# Patient Record
Sex: Male | Born: 1935 | Race: White | Hispanic: No | Marital: Married | Smoking: Former smoker
Health system: Southern US, Community
[De-identification: ages and names within clinical notes are randomized; demographics above are authoritative.]

## PROBLEM LIST (undated history)

## (undated) DIAGNOSIS — M549 Dorsalgia, unspecified: Secondary | ICD-10-CM

## (undated) DIAGNOSIS — Z87442 Personal history of urinary calculi: Secondary | ICD-10-CM

## (undated) DIAGNOSIS — I10 Essential (primary) hypertension: Secondary | ICD-10-CM

## (undated) DIAGNOSIS — K759 Inflammatory liver disease, unspecified: Secondary | ICD-10-CM

## (undated) DIAGNOSIS — G8929 Other chronic pain: Secondary | ICD-10-CM

## (undated) DIAGNOSIS — D649 Anemia, unspecified: Secondary | ICD-10-CM

## (undated) HISTORY — PX: BACK SURGERY: SHX140

## (undated) HISTORY — PX: ESOPHAGOGASTRODUODENOSCOPY: SHX1529

## (undated) HISTORY — PX: COLONOSCOPY: SHX174

## (undated) HISTORY — PX: CYSTOSCOPY: SUR368

## (undated) HISTORY — PX: LITHOTRIPSY: SUR834

---

## 1999-01-24 ENCOUNTER — Ambulatory Visit (HOSPITAL_COMMUNITY): Admission: RE | Admit: 1999-01-24 | Discharge: 1999-01-24 | Payer: Self-pay | Admitting: Gastroenterology

## 2004-06-08 ENCOUNTER — Inpatient Hospital Stay (HOSPITAL_COMMUNITY): Admission: EM | Admit: 2004-06-08 | Discharge: 2004-06-10 | Payer: Self-pay | Admitting: Emergency Medicine

## 2004-08-17 ENCOUNTER — Encounter: Admission: RE | Admit: 2004-08-17 | Discharge: 2004-08-17 | Payer: Self-pay | Admitting: Internal Medicine

## 2004-08-21 ENCOUNTER — Encounter: Admission: RE | Admit: 2004-08-21 | Discharge: 2004-08-21 | Payer: Self-pay | Admitting: Internal Medicine

## 2005-12-31 ENCOUNTER — Encounter: Admission: RE | Admit: 2005-12-31 | Discharge: 2005-12-31 | Payer: Self-pay | Admitting: Surgery

## 2006-01-02 ENCOUNTER — Ambulatory Visit (HOSPITAL_BASED_OUTPATIENT_CLINIC_OR_DEPARTMENT_OTHER): Admission: RE | Admit: 2006-01-02 | Discharge: 2006-01-02 | Payer: Self-pay | Admitting: Surgery

## 2010-03-05 ENCOUNTER — Encounter: Payer: Self-pay | Admitting: Internal Medicine

## 2011-01-01 ENCOUNTER — Other Ambulatory Visit: Payer: Self-pay | Admitting: Orthopaedic Surgery

## 2011-01-01 DIAGNOSIS — M545 Low back pain, unspecified: Secondary | ICD-10-CM

## 2011-01-05 ENCOUNTER — Ambulatory Visit
Admission: RE | Admit: 2011-01-05 | Discharge: 2011-01-05 | Disposition: A | Discharge status: Home / Self Care | Payer: Medicare Other | Source: Ambulatory Visit | Attending: Orthopaedic Surgery | Admitting: Orthopaedic Surgery

## 2011-01-05 DIAGNOSIS — M545 Low back pain: Secondary | ICD-10-CM

## 2011-01-05 MED ORDER — GADOBENATE DIMEGLUMINE 529 MG/ML IV SOLN
16.0000 mL | Freq: Once | INTRAVENOUS | Status: AC | PRN
  Administered 2011-01-05: 16 mL via INTRAVENOUS

## 2011-01-24 ENCOUNTER — Encounter (HOSPITAL_COMMUNITY): Payer: Self-pay

## 2011-01-25 ENCOUNTER — Other Ambulatory Visit (HOSPITAL_COMMUNITY): Payer: Self-pay | Admitting: Orthopedic Surgery

## 2011-01-25 ENCOUNTER — Other Ambulatory Visit (HOSPITAL_COMMUNITY): Payer: Self-pay | Admitting: Orthopaedic Surgery

## 2011-01-26 ENCOUNTER — Encounter (HOSPITAL_COMMUNITY)
Admission: RE | Admit: 2011-01-26 | Discharge: 2011-01-26 | Disposition: A | Discharge status: Home / Self Care | Payer: Medicare Other | Source: Ambulatory Visit | Attending: Orthopaedic Surgery | Admitting: Orthopaedic Surgery

## 2011-01-26 ENCOUNTER — Other Ambulatory Visit: Payer: Self-pay

## 2011-01-26 ENCOUNTER — Encounter (HOSPITAL_COMMUNITY): Payer: Self-pay

## 2011-01-26 DIAGNOSIS — M5126 Other intervertebral disc displacement, lumbar region: Secondary | ICD-10-CM | POA: Diagnosis present

## 2011-01-26 HISTORY — DX: Anemia, unspecified: D64.9

## 2011-01-26 HISTORY — DX: Essential (primary) hypertension: I10

## 2011-01-26 LAB — CBC
HCT: 45.3 % (ref 39.0–52.0)
Hemoglobin: 16.1 g/dL (ref 13.0–17.0)
MCH: 34 pg (ref 26.0–34.0)
MCHC: 35.5 g/dL (ref 30.0–36.0)
MCV: 95.8 fL (ref 78.0–100.0)
Platelets: 225 K/uL (ref 150–400)
RBC: 4.73 MIL/uL (ref 4.22–5.81)
RDW: 12.2 % (ref 11.5–15.5)
WBC: 11.6 K/uL — ABNORMAL HIGH (ref 4.0–10.5)

## 2011-01-26 LAB — COMPREHENSIVE METABOLIC PANEL
ALT: 15 U/L (ref 0–53)
AST: 17 U/L (ref 0–37)
Albumin: 3.2 g/dL — ABNORMAL LOW (ref 3.5–5.2)
Alkaline Phosphatase: 55 U/L (ref 39–117)
Calcium: 11.7 mg/dL — ABNORMAL HIGH (ref 8.4–10.5)
Potassium: 3.3 mEq/L — ABNORMAL LOW (ref 3.5–5.1)
Sodium: 137 mEq/L (ref 135–145)
Total Protein: 6.6 g/dL (ref 6.0–8.3)

## 2011-01-26 NOTE — Consult Note (Signed)
Anesthesia:  Patient is a 77 year old male scheduled for a right L4-5 microdiskectomy on 01/29/11.  His history included HTN and anemia.  He is a former smoker.  He just had his PAT appointment this afternoon.   I was asked to evaluate his preoperative EKG showing ST, LAD, possible prior inferior and anterolateral infarct.  He had a previous EKG on 12/31/05 done before his right IHR.  Overall, I think his EKG is stable since 2007.  Clinical correlation on the day of surgery, but if asymptomatic then I anticipate that he can proceed.  CXR and labs noted.  Cr 1.5 and non-fasting glucose was 157.  No recent labs to compare.  Dr. Yates can follow Cr post-operatively.  I'll order a CBG to be done on arrival. 

## 2011-01-26 NOTE — H&P (Signed)
Jonathan Berger is an 77 y.o. male.   Chief Complaint:right leg pain   HPI: Pt with approximately 2 months of back pain and right leg pain.  Difficulty ambulating and increased pain in right buttock with sitting.  Conservative treatment including ESI, steroid dose pak and activity modification have failed to provide relief.  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  Pt has history of HNP right L4-5 treated with microdiscectomy in 2006 by Dr. Ophelia Charter.  Previous to that another lumbar surgery 30 years ago.  As pt has failed conservative treatment and continues to have severe pain in the right leg it is felt he will benefit from surgical intervention.  Pt wishes to proceed with microdiscectomy at right L4-5 by Dr Ophelia Charter.  Risks and benefits of the surgery discussed with pt by Dr. Ophelia Charter.  Pain 10 out of 10  , Can't sleep, percocet around the clock. Leg giving way even with use of a cane.    No past medical history on file.PMH: HTN .PMH  No past surgical history on file.PSH: s/p microdiscectomy right L4-5 in 2006  .PSH  No family history on file. Marland KitchenFAMHX Social History:  does not have a smoking history on file. He does not have any smokeless tobacco history on file. His alcohol and drug histories not on file. Marland KitchenSOCHX  Allergies: No Known Allergies .ALG  No current facility-administered medications on file as of .   No current outpatient prescriptions on file as of .  Marland KitchenHMEDS  No results found for this or any previous visit (from the past 48 hour(s)). No results found.  Review of Systems  Constitutional: Negative.   HENT: Negative.   Eyes: Negative.   Respiratory: Negative.   Cardiovascular: Negative.   Gastrointestinal: Negative.   Genitourinary: Negative.   Musculoskeletal: Positive for back pain.  Skin: Negative.   Neurological: Negative.   Endo/Heme/Allergies: Negative.     Psychiatric/Behavioral: Negative.     There were no vitals taken for this visit..VS Physical Exam  Constitutional: He is oriented to person, place, and time. He appears well-developed and well-nourished.  HENT:  Head: Normocephalic and atraumatic.  Eyes: EOM are normal. Pupils are equal, round, and reactive to light.  Neck: Normal range of motion. Neck supple.  Cardiovascular: Normal rate, regular rhythm, normal heart sounds and intact distal pulses.   Respiratory: Breath sounds normal.  GI: Soft. Bowel sounds are normal.  Musculoskeletal:       Ambulates with a flexed gait.  +SLR right. + sciatic notch tenderness on right.  No focal weakness of lower extremities  Negative popliteal compression test right Sensation intact LEs  Neurological: He is alert and oriented to person, place, and time.  Skin: Skin is warm and dry.  Psychiatric: He has a normal mood and affect.     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

## 2011-01-26 NOTE — Pre-Procedure Instructions (Signed)
20 Jonathan Berger  01/26/2011   Your procedure is scheduled on:  Monday, Dec 17  Report to [facility] at 0730 AM.  Call this number if you have problems the morning of surgery: [phone]   Remember:   Do not eat food:After Midnight.  May have clear liquids: up to 4 Hours before arrival.  Clear liquids include soda, tea, black coffee, apple or grape juice, broth.  Take these medicines the morning of surgery with A SIP OF WATER: *Exforge**   Do not wear jewelry, make-up or nail polish.  Do not wear lotions, powders, or perfumes. You may wear deodorant.  Do not shave 48 hours prior to surgery.  Do not bring valuables to the hospital.  Contacts, dentures or bridgework may not be worn into surgery.  Leave suitcase in the car. After surgery it may be brought to your room.  For patients admitted to the hospital, checkout time is 11:00 AM the day of discharge.   Patients discharged the day of surgery will not be allowed to drive home.  Name and phone number of your driver: *n/a**  Special Instructions: CHG Shower Use Special Wash: 1/2 bottle night before surgery and 1/2 bottle morning of surgery.   Please read over the following fact sheets that you were given: Pain Booklet, Coughing and Deep Breathing, MRSA Information and Surgical Site Infection Prevention

## 2011-01-28 MED ORDER — CEFAZOLIN SODIUM 1-5 GM-% IV SOLN
1.0000 g | INTRAVENOUS | Status: AC
  Administered 2011-01-29: 1 g via INTRAVENOUS
  Filled 2011-01-28: qty 50

## 2011-01-29 ENCOUNTER — Ambulatory Visit (HOSPITAL_COMMUNITY): Payer: Medicare Other | Admitting: Vascular Surgery

## 2011-01-29 ENCOUNTER — Ambulatory Visit (HOSPITAL_COMMUNITY): Payer: Medicare Other

## 2011-01-29 ENCOUNTER — Ambulatory Visit (HOSPITAL_COMMUNITY)
Admission: RE | Admit: 2011-01-29 | Discharge: 2011-01-30 | Disposition: A | Discharge status: Home / Self Care | Payer: Medicare Other | Source: Ambulatory Visit | Attending: Orthopaedic Surgery | Admitting: Orthopaedic Surgery

## 2011-01-29 ENCOUNTER — Encounter (HOSPITAL_COMMUNITY): Payer: Self-pay | Admitting: Vascular Surgery

## 2011-01-29 ENCOUNTER — Encounter (HOSPITAL_COMMUNITY)
Admission: RE | Disposition: A | Discharge status: Home / Self Care | Payer: Self-pay | Source: Ambulatory Visit | Attending: Orthopaedic Surgery

## 2011-01-29 ENCOUNTER — Encounter (HOSPITAL_COMMUNITY): Payer: Self-pay | Admitting: Surgery

## 2011-01-29 DIAGNOSIS — Z01812 Encounter for preprocedural laboratory examination: Secondary | ICD-10-CM | POA: Insufficient documentation

## 2011-01-29 DIAGNOSIS — M5126 Other intervertebral disc displacement, lumbar region: Secondary | ICD-10-CM | POA: Insufficient documentation

## 2011-01-29 DIAGNOSIS — Z87891 Personal history of nicotine dependence: Secondary | ICD-10-CM | POA: Insufficient documentation

## 2011-01-29 DIAGNOSIS — Z01810 Encounter for preprocedural cardiovascular examination: Secondary | ICD-10-CM | POA: Insufficient documentation

## 2011-01-29 DIAGNOSIS — I10 Essential (primary) hypertension: Secondary | ICD-10-CM | POA: Insufficient documentation

## 2011-01-29 DIAGNOSIS — Z01818 Encounter for other preprocedural examination: Secondary | ICD-10-CM | POA: Insufficient documentation

## 2011-01-29 HISTORY — PX: LUMBAR LAMINECTOMY: SHX95

## 2011-01-29 LAB — URINALYSIS, ROUTINE W REFLEX MICROSCOPIC
Bilirubin Urine: NEGATIVE
Glucose, UA: NEGATIVE mg/dL
Hgb urine dipstick: NEGATIVE
Protein, ur: NEGATIVE mg/dL

## 2011-01-29 LAB — GLUCOSE, CAPILLARY: Glucose-Capillary: 123 mg/dL — ABNORMAL HIGH (ref 70–99)

## 2011-01-29 SURGERY — MICRODISCECTOMY LUMBAR LAMINECTOMY
Anesthesia: General | Site: Back | Laterality: Right | Wound class: Clean

## 2011-01-29 MED ORDER — MORPHINE SULFATE 4 MG/ML IJ SOLN: 1.0000 mg | INTRAMUSCULAR | Status: DC | PRN

## 2011-01-29 MED ORDER — KETOROLAC TROMETHAMINE 30 MG/ML IJ SOLN
30.0000 mg | Freq: Once | INTRAMUSCULAR | Status: AC
  Administered 2011-01-29: 30 mg via INTRAVENOUS
  Filled 2011-01-29: qty 1

## 2011-01-29 MED ORDER — HYDROCODONE-ACETAMINOPHEN 5-325 MG PO TABS
1.0000 | ORAL_TABLET | ORAL | Status: DC | PRN
  Administered 2011-01-29 (×2): 1 via ORAL
  Administered 2011-01-30 (×3): 2 via ORAL
  Filled 2011-01-29: qty 1
  Filled 2011-01-29 (×2): qty 2
  Filled 2011-01-29: qty 1
  Filled 2011-01-29: qty 2

## 2011-01-29 MED ORDER — OLMESARTAN MEDOXOMIL 40 MG PO TABS
40.0000 mg | ORAL_TABLET | Freq: Every day | ORAL | Status: DC
  Administered 2011-01-30: 40 mg via ORAL
  Filled 2011-01-29: qty 1

## 2011-01-29 MED ORDER — ZOLPIDEM TARTRATE 10 MG PO TABS: 10.0000 mg | ORAL_TABLET | Freq: Every evening | ORAL | Status: DC | PRN

## 2011-01-29 MED ORDER — MIDAZOLAM HCL 2 MG/2ML IJ SOLN
INTRAMUSCULAR | Status: AC
  Filled 2011-01-29: qty 2

## 2011-01-29 MED ORDER — METHOCARBAMOL 100 MG/ML IJ SOLN: 500.0000 mg | Freq: Four times a day (QID) | INTRAVENOUS | Status: DC | PRN

## 2011-01-29 MED ORDER — MORPHINE SULFATE 2 MG/ML IJ SOLN
INTRAMUSCULAR | Status: AC
  Administered 2011-01-29: 2 mg via INTRAVENOUS
  Filled 2011-01-29: qty 1

## 2011-01-29 MED ORDER — LIDOCAINE HCL (CARDIAC) 20 MG/ML IV SOLN
INTRAVENOUS | Status: DC | PRN
  Administered 2011-01-29: 80 mg via INTRAVENOUS

## 2011-01-29 MED ORDER — PROPOFOL 10 MG/ML IV EMUL
INTRAVENOUS | Status: DC | PRN
  Administered 2011-01-29: 150 mg via INTRAVENOUS

## 2011-01-29 MED ORDER — SODIUM CHLORIDE 0.9 % IV SOLN: 250.0000 mL | INTRAVENOUS | Status: DC

## 2011-01-29 MED ORDER — LACTATED RINGERS IV SOLN
INTRAVENOUS | Status: DC | PRN
  Administered 2011-01-29: 10:00:00 via INTRAVENOUS

## 2011-01-29 MED ORDER — HYDROMORPHONE HCL PF 1 MG/ML IJ SOLN
0.2500 mg | INTRAMUSCULAR | Status: DC | PRN
  Administered 2011-01-29 (×2): 0.5 mg via INTRAVENOUS

## 2011-01-29 MED ORDER — KCL IN DEXTROSE-NACL 20-5-0.45 MEQ/L-%-% IV SOLN
INTRAVENOUS | Status: DC
  Administered 2011-01-29 – 2011-01-30 (×2): via INTRAVENOUS
  Filled 2011-01-29 (×3): qty 1000

## 2011-01-29 MED ORDER — MIDAZOLAM HCL 2 MG/2ML IJ SOLN
1.0000 mg | INTRAMUSCULAR | Status: DC | PRN
  Administered 2011-01-29: 2 mg via INTRAVENOUS

## 2011-01-29 MED ORDER — SODIUM CHLORIDE 0.9 % IJ SOLN: 3.0000 mL | Freq: Two times a day (BID) | INTRAMUSCULAR | Status: DC

## 2011-01-29 MED ORDER — GLYCOPYRROLATE 0.2 MG/ML IJ SOLN
INTRAMUSCULAR | Status: DC | PRN
  Administered 2011-01-29: .4 mg via INTRAVENOUS

## 2011-01-29 MED ORDER — AMLODIPINE BESYLATE 5 MG PO TABS
5.0000 mg | ORAL_TABLET | Freq: Every day | ORAL | Status: DC
  Administered 2011-01-30: 5 mg via ORAL
  Filled 2011-01-29: qty 1

## 2011-01-29 MED ORDER — SODIUM CHLORIDE 0.9 % IJ SOLN: 3.0000 mL | INTRAMUSCULAR | Status: DC | PRN

## 2011-01-29 MED ORDER — VANCOMYCIN HCL IN DEXTROSE 1-5 GM/200ML-% IV SOLN
1000.0000 mg | Freq: Once | INTRAVENOUS | Status: AC
  Administered 2011-01-29: 1000 mg via INTRAVENOUS
  Filled 2011-01-29: qty 200

## 2011-01-29 MED ORDER — ONDANSETRON HCL 4 MG/2ML IJ SOLN: 4.0000 mg | INTRAMUSCULAR | Status: DC | PRN

## 2011-01-29 MED ORDER — ROCURONIUM BROMIDE 100 MG/10ML IV SOLN
INTRAVENOUS | Status: DC | PRN
  Administered 2011-01-29: 50 mg via INTRAVENOUS

## 2011-01-29 MED ORDER — OXYCODONE-ACETAMINOPHEN 5-325 MG PO TABS: 1.0000 | ORAL_TABLET | ORAL | Status: DC | PRN

## 2011-01-29 MED ORDER — ACETAMINOPHEN 650 MG RE SUPP: 650.0000 mg | RECTAL | Status: DC | PRN

## 2011-01-29 MED ORDER — 0.9 % SODIUM CHLORIDE (POUR BTL) OPTIME
TOPICAL | Status: DC | PRN
  Administered 2011-01-29: 1000 mL

## 2011-01-29 MED ORDER — ONDANSETRON HCL 4 MG/2ML IJ SOLN
INTRAMUSCULAR | Status: DC | PRN
  Administered 2011-01-29: 4 mg via INTRAVENOUS

## 2011-01-29 MED ORDER — PANTOPRAZOLE SODIUM 40 MG IV SOLR
40.0000 mg | Freq: Every day | INTRAVENOUS | Status: DC
  Administered 2011-01-29: 40 mg via INTRAVENOUS
  Filled 2011-01-29 (×2): qty 40

## 2011-01-29 MED ORDER — PHENYLEPHRINE HCL 10 MG/ML IJ SOLN
10.0000 mg | INTRAVENOUS | Status: DC | PRN
  Administered 2011-01-29: 50 ug/min via INTRAVENOUS

## 2011-01-29 MED ORDER — FENTANYL CITRATE 0.05 MG/ML IJ SOLN
50.0000 ug | INTRAMUSCULAR | Status: DC | PRN
  Administered 2011-01-29: 100 ug via INTRAVENOUS

## 2011-01-29 MED ORDER — OXYCODONE-ACETAMINOPHEN 5-325 MG PO TABS: 1.0000 | ORAL_TABLET | ORAL | Status: AC | PRN

## 2011-01-29 MED ORDER — AMLODIPINE BESYLATE-VALSARTAN 5-320 MG PO TABS
1.0000 | ORAL_TABLET | Freq: Every day | ORAL | Status: DC
Start: 2011-01-29 — End: 2011-01-29

## 2011-01-29 MED ORDER — PHENOL 1.4 % MT LIQD: 1.0000 | OROMUCOSAL | Status: DC | PRN

## 2011-01-29 MED ORDER — LACTATED RINGERS IV SOLN
INTRAVENOUS | Status: DC
  Administered 2011-01-29: 09:00:00 via INTRAVENOUS

## 2011-01-29 MED ORDER — FENTANYL CITRATE 0.05 MG/ML IJ SOLN
INTRAMUSCULAR | Status: AC
  Filled 2011-01-29: qty 2

## 2011-01-29 MED ORDER — THROMBIN 20000 UNITS EX KIT
PACK | CUTANEOUS | Status: DC | PRN
  Administered 2011-01-29: 11:00:00 via TOPICAL

## 2011-01-29 MED ORDER — ZOLPIDEM TARTRATE 5 MG PO TABS: 5.0000 mg | ORAL_TABLET | Freq: Every evening | ORAL | Status: DC | PRN

## 2011-01-29 MED ORDER — METHOCARBAMOL 500 MG PO TABS: 500.0000 mg | ORAL_TABLET | Freq: Four times a day (QID) | ORAL | Status: DC | PRN

## 2011-01-29 MED ORDER — NEOSTIGMINE METHYLSULFATE 1 MG/ML IJ SOLN
INTRAMUSCULAR | Status: DC | PRN
  Administered 2011-01-29: 3 mg via INTRAVENOUS

## 2011-01-29 MED ORDER — MENTHOL 3 MG MT LOZG: 1.0000 | LOZENGE | OROMUCOSAL | Status: DC | PRN

## 2011-01-29 MED ORDER — MUPIROCIN 2 % EX OINT
1.0000 "application " | TOPICAL_OINTMENT | Freq: Two times a day (BID) | CUTANEOUS | Status: DC
  Administered 2011-01-29: 1 via NASAL
  Filled 2011-01-29: qty 22

## 2011-01-29 MED ORDER — PHENYLEPHRINE HCL 10 MG/ML IJ SOLN
INTRAMUSCULAR | Status: DC | PRN
  Administered 2011-01-29: 80 ug via INTRAVENOUS
  Administered 2011-01-29: 40 ug via INTRAVENOUS
  Administered 2011-01-29 (×2): 80 ug via INTRAVENOUS

## 2011-01-29 MED ORDER — ACETAMINOPHEN 325 MG PO TABS: 650.0000 mg | ORAL_TABLET | ORAL | Status: DC | PRN

## 2011-01-29 MED ORDER — DOCUSATE SODIUM 100 MG PO CAPS
100.0000 mg | ORAL_CAPSULE | Freq: Two times a day (BID) | ORAL | Status: DC
  Administered 2011-01-30: 100 mg via ORAL
  Filled 2011-01-29 (×3): qty 1

## 2011-01-29 MED ORDER — FENTANYL CITRATE 0.05 MG/ML IJ SOLN
INTRAMUSCULAR | Status: DC | PRN
  Administered 2011-01-29 (×3): 50 ug via INTRAVENOUS
  Administered 2011-01-29: 100 ug via INTRAVENOUS

## 2011-01-29 MED ORDER — SENNOSIDES-DOCUSATE SODIUM 8.6-50 MG PO TABS: 1.0000 | ORAL_TABLET | Freq: Every evening | ORAL | Status: DC | PRN

## 2011-01-29 MED ORDER — ONDANSETRON HCL 4 MG/2ML IJ SOLN: 4.0000 mg | Freq: Four times a day (QID) | INTRAMUSCULAR | Status: DC | PRN

## 2011-01-29 SURGICAL SUPPLY — 47 items
BENZOIN TINCTURE PRP APPL 2/3 (GAUZE/BANDAGES/DRESSINGS) ×2 IMPLANT
BUR ROUND FLUTED 4 SOFT TCH (BURR) IMPLANT
CLOSURE STERI STRIP 1/2 X4 (GAUZE/BANDAGES/DRESSINGS) ×2 IMPLANT
CLOTH BEACON ORANGE TIMEOUT ST (SAFETY) ×2 IMPLANT
CORDS BIPOLAR (ELECTRODE) ×2 IMPLANT
COVER SURGICAL LIGHT HANDLE (MISCELLANEOUS) ×2 IMPLANT
DRAPE MICROSCOPE LEICA (MISCELLANEOUS) ×2 IMPLANT
DRAPE PROXIMA HALF (DRAPES) ×4 IMPLANT
DRSG EMULSION OIL 3X3 NADH (GAUZE/BANDAGES/DRESSINGS) ×2 IMPLANT
DURAPREP 26ML APPLICATOR (WOUND CARE) ×2 IMPLANT
ELECT REM PT RETURN 9FT ADLT (ELECTROSURGICAL) ×2
ELECTRODE REM PT RTRN 9FT ADLT (ELECTROSURGICAL) ×1 IMPLANT
GAUZE SPONGE 4X4 12PLY STRL LF (GAUZE/BANDAGES/DRESSINGS) ×2 IMPLANT
GLOVE BIOGEL PI IND STRL 7.5 (GLOVE) ×1 IMPLANT
GLOVE BIOGEL PI IND STRL 8 (GLOVE) ×1 IMPLANT
GLOVE BIOGEL PI INDICATOR 7.5 (GLOVE) ×1
GLOVE BIOGEL PI INDICATOR 8 (GLOVE) ×1
GLOVE ECLIPSE 7.0 STRL STRAW (GLOVE) ×2 IMPLANT
GLOVE ORTHO TXT STRL SZ7.5 (GLOVE) ×2 IMPLANT
GOWN PREVENTION PLUS LG XLONG (DISPOSABLE) IMPLANT
GOWN STRL NON-REIN LRG LVL3 (GOWN DISPOSABLE) ×6 IMPLANT
KIT BASIN OR (CUSTOM PROCEDURE TRAY) ×2 IMPLANT
KIT ROOM TURNOVER OR (KITS) ×2 IMPLANT
MANIFOLD NEPTUNE II (INSTRUMENTS) ×2 IMPLANT
NDL SUT .5 MAYO 1.404X.05X (NEEDLE) ×1 IMPLANT
NEEDLE HYPO 25GX1X1/2 BEV (NEEDLE) ×2 IMPLANT
NEEDLE MAYO TAPER (NEEDLE) ×1
NEEDLE SPNL 18GX3.5 QUINCKE PK (NEEDLE) ×2 IMPLANT
NS IRRIG 1000ML POUR BTL (IV SOLUTION) ×2 IMPLANT
PACK LAMINECTOMY ORTHO (CUSTOM PROCEDURE TRAY) ×2 IMPLANT
PAD ARMBOARD 7.5X6 YLW CONV (MISCELLANEOUS) ×4 IMPLANT
PATTIES SURGICAL .5 X.5 (GAUZE/BANDAGES/DRESSINGS) ×2 IMPLANT
PATTIES SURGICAL .75X.75 (GAUZE/BANDAGES/DRESSINGS) IMPLANT
SPONGE GAUZE 4X4 12PLY (GAUZE/BANDAGES/DRESSINGS) ×2 IMPLANT
STAPLER VISISTAT 35W (STAPLE) IMPLANT
STRIP CLOSURE SKIN 1/2X4 (GAUZE/BANDAGES/DRESSINGS) IMPLANT
SUT VIC AB 2-0 CT1 27 (SUTURE) ×1
SUT VIC AB 2-0 CT1 TAPERPNT 27 (SUTURE) ×1 IMPLANT
SUT VICRYL 0 TIES 12 18 (SUTURE) ×2 IMPLANT
SUT VICRYL 4-0 PS2 18IN ABS (SUTURE) IMPLANT
SUT VICRYL AB 2 0 TIES (SUTURE) ×2 IMPLANT
SYR 20ML ECCENTRIC (SYRINGE) IMPLANT
SYR CONTROL 10ML LL (SYRINGE) ×2 IMPLANT
TAPE CLOTH SURG 6X10 WHT LF (GAUZE/BANDAGES/DRESSINGS) ×2 IMPLANT
TOWEL OR 17X24 6PK STRL BLUE (TOWEL DISPOSABLE) ×2 IMPLANT
TOWEL OR 17X26 10 PK STRL BLUE (TOWEL DISPOSABLE) ×2 IMPLANT
WATER STERILE IRR 1000ML POUR (IV SOLUTION) ×2 IMPLANT

## 2011-01-29 NOTE — Brief Op Note (Signed)
01/29/2011  12:24 PM  PATIENT:  Jonathan Berger  77 y.o. male  PRE-OPERATIVE DIAGNOSIS:  Right L4-5 HNP recurrent  POST-OPERATIVE DIAGNOSIS:  Right L4-5 HNP recurrent  PROCEDURE:  Procedure(s): MICRODISCECTOMY LUMBAR LAMINECTOMY repeat at right L4-5  SURGEON:  Surgeon(s): Eldred Manges  PHYSICIAN ASSISTANT: Shara Blazing ASSISTANTS: none   ANESTHESIA:   general  EBL:  Total I/O In: 1600 [I.V.:1600] Out: -   BLOOD ADMINISTERED:none  DRAINS: none   LOCAL MEDICATIONS USED:  NONE  SPECIMEN:  No Specimen  DISPOSITION OF SPECIMEN:  N/A  COUNTS:  YES  TOURNIQUET:  * No tourniquets in log *  DICTATION: .Note written in EPIC  PLAN OF CARE: Admit for overnight observation  PATIENT DISPOSITION:  PACU - hemodynamically stable.   Delay start of Pharmacological VTE agent (>24hrs) due to surgical blood loss or risk of bleeding:  {YES/NO/NOT APPLICABLE:20182

## 2011-01-29 NOTE — Anesthesia Preprocedure Evaluation (Addendum)
Anesthesia Evaluation  Patient identified by MRN, date of birth, ID band Patient awake    Reviewed: Allergy & Precautions, H&P , NPO status , Patient's Chart, lab work & pertinent test results  Airway Mallampati: II  Neck ROM: full    Dental  (+) Dental Advisory Given and Edentulous Upper   Pulmonary former smoker         Cardiovascular hypertension, Pt. on medications     Neuro/Psych    GI/Hepatic   Endo/Other    Renal/GU      Musculoskeletal   Abdominal   Peds  Hematology   Anesthesia Other Findings   Reproductive/Obstetrics                          Anesthesia Physical Anesthesia Plan  ASA: II  Anesthesia Plan: General   Post-op Pain Management:    Induction: Intravenous  Airway Management Planned: Oral ETT  Additional Equipment:   Intra-op Plan:   Post-operative Plan: Extubation in OR  Informed Consent: I have reviewed the patients History and Physical, chart, labs and discussed the procedure including the risks, benefits and alternatives for the proposed anesthesia with the patient or authorized representative who has indicated his/her understanding and acceptance.   Dental advisory given  Plan Discussed with: CRNA, Surgeon and Anesthesiologist  Anesthesia Plan Comments:        Anesthesia Quick Evaluation

## 2011-01-29 NOTE — H&P (View-Only) (Signed)
Anesthesia:  Patient is a 77 year old male scheduled for a right L4-5 microdiskectomy on 01/29/11.  His history included HTN and anemia.  He is a former smoker.  He just had his PAT appointment this afternoon.   I was asked to evaluate his preoperative EKG showing ST, LAD, possible prior inferior and anterolateral infarct.  He had a previous EKG on 12/31/05 done before his right IHR.  Overall, I think his EKG is stable since 2007.  Clinical correlation on the day of surgery, but if asymptomatic then I anticipate that he can proceed.  CXR and labs noted.  Cr 1.5 and non-fasting glucose was 157.  No recent labs to compare.  Dr. Ophelia Charter can follow Cr post-operatively.  I'll order a CBG to be done on arrival.

## 2011-01-29 NOTE — Interval H&P Note (Signed)
History and Physical Interval Note:  01/29/2011 9:30 AM  Jonathan Berger  has presented today for surgery, with the diagnosis of Right L4-5 HNP  The various methods of treatment have been discussed with the patient and family. After consideration of risks, benefits and other options for treatment, the patient has consented to  Procedure(s): MICRODISCECTOMY LUMBAR LAMINECTOMY as a surgical intervention .  The patients' history has been reviewed, patient examined, no change in status, stable for surgery.  I have reviewed the patients' chart and labs.  Questions were answered to the patient's satisfaction.     Kourosh Jablonsky C

## 2011-01-29 NOTE — Preoperative (Signed)
Beta Blockers   Reason not to administer Beta Blockers:Not Applicable 

## 2011-01-29 NOTE — Anesthesia Procedure Notes (Signed)
Procedure Name: Intubation Date/Time: 01/29/2011 10:40 AM Performed by: Malachi Pro Pre-anesthesia Checklist: Patient identified, Emergency Drugs available, Suction available, Patient being monitored and Timeout performed Patient Re-evaluated:Patient Re-evaluated prior to inductionOxygen Delivery Method: Circle System Utilized Preoxygenation: Pre-oxygenation with 100% oxygen Intubation Type: IV induction Ventilation: Nasal airway inserted- appropriate to patient size Laryngoscope Size: 3 and Mac Grade View: Grade I Tube type: Oral Tube size: 7.5 mm Number of attempts: 1 Airway Equipment and Method: stylet Placement Confirmation: ETT inserted through vocal cords under direct vision,  positive ETCO2 and breath sounds checked- equal and bilateral Secured at: 22 cm Tube secured with: Tape Dental Injury: Teeth and Oropharynx as per pre-operative assessment

## 2011-01-29 NOTE — Anesthesia Postprocedure Evaluation (Signed)
Anesthesia Post Note  Patient: Jonathan Berger  Procedure(s) Performed:  MICRODISCECTOMY LUMBAR LAMINECTOMY - Right L4-5 Microdiscectomy  Anesthesia type: General  Patient location: PACU  Post pain: Pain level controlled and Adequate analgesia  Post assessment: Post-op Vital signs reviewed, Patient's Cardiovascular Status Stable, Respiratory Function Stable, Patent Airway and Pain level controlled  Last Vitals:  Filed Vitals:   01/29/11 1230  BP:   Pulse:   Temp: 36.8 C  Resp:     Post vital signs: Reviewed and stable  Level of consciousness: awake, alert  and oriented  Complications: No apparent anesthesia complications

## 2011-01-29 NOTE — Progress Notes (Addendum)
PHARMACY - ANTIBIOTIC CONSULT  INITIAL NOTE  Pharmacy Consult for: Vancomycin  Indication:  S/p lumbar surgery   Patient Data:   Allergies: No Known Allergies  Patient Measurements: Height: 5'11" Weight: 75 kg   Vital Signs: Temp:  [97.5 F (36.4 C)-98.5 F (36.9 C)] 98.5 F (36.9 C) (12/17 1349) Pulse Rate:  [76-93] 93  (12/17 0856) Resp:  [16-18] 16  (12/17 0856) BP: (144)/(94) 144/94 mmHg (12/17 0717) SpO2:  [94 %-100 %] 100 % (12/17 0856)  Intake/Output from previous day:  Intake/Output Summary (Last 24 hours) at 01/29/11 1522 Last data filed at 01/29/11 1215  Gross per 24 hour  Intake   1600 ml  Output      0 ml  Net   1600 ml    Labs: No results found for this basename: WBC:3,HGB:3,PLT:3,LABCREA:3,CREATININE:3 in the last 72 hours CrCl is unknown because there is no height on file for the current visit. No results found for this basename: VANCOTROUGH:2,VANCOPEAK:2,VANCORANDOM:2,GENTTROUGH:2,GENTPEAK:2,GENTRANDOM:2,TOBRATROUGH:2,TOBRAPEAK:2,TOBRARND:2,AMIKACINPEAK:2,AMIKACINTROU:2,AMIKACIN:2, in the last 72 hours   Microbiology: Recent Results (from the past 720 hour(s))  SURGICAL PCR SCREEN     Status: Abnormal   Collection Time   01/26/11  3:02 PM      Component Value Range Status Comment   MRSA, PCR POSITIVE (*) NEGATIVE  Final    Staphylococcus aureus POSITIVE (*) NEGATIVE  Final     Medical History: Past Medical History  Diagnosis Date  . Hypertension   . Anemia     Scheduled Medications:     . amLODipine  5 mg Oral Daily  . ceFAZolin (ANCEF) IV  1 g Intravenous 60 min Pre-Op  . docusate sodium  100 mg Oral BID  . ketorolac  30 mg Intravenous Once  . morphine      . mupirocin ointment  1 application Nasal BID  . olmesartan  40 mg Oral Daily  . pantoprazole (PROTONIX) IV  40 mg Intravenous QHS  . sodium chloride  3 mL Intravenous Q12H  . vancomycin  1,000 mg Intravenous Once  . DISCONTD: amLODipine-valsartan  1 tablet Oral Daily       Assessment:  78 y.o. male s/p microdiscectomy lumbar laminectomy. Pharmacy consulted to manage vancomycin. Per RN, patient without drain, therefore only one dose of vancomycin post-op ordered. Patient received vancomycin 1000mg  IV at 10:19 today pre-op.      Plan:  1. Vancomycin 1000mg  IV x 1 at 22:00 tonight. 2. Pharmacy will sign-off. Thank you for the consult.     Jonathan Berger, PharmD 01/29/2011, 3:22 PM

## 2011-01-29 NOTE — Transfer of Care (Signed)
Immediate Anesthesia Transfer of Care Note  Patient: Jonathan Berger  Procedure(s) Performed:  MICRODISCECTOMY LUMBAR LAMINECTOMY - Right L4-5 Microdiscectomy  Patient Location: PACU  Anesthesia Type: General  Level of Consciousness: awake and patient cooperative  Airway & Oxygen Therapy: Patient Spontanous Breathing and Patient connected to face mask oxygen  Post-op Assessment: Report given to PACU RN, Post -op Vital signs reviewed and stable and Patient moving all extremities X 4  Post vital signs: Reviewed and stable  Complications: No apparent anesthesia complications

## 2011-01-30 ENCOUNTER — Encounter (HOSPITAL_COMMUNITY): Payer: Self-pay | Admitting: Orthopaedic Surgery

## 2011-01-30 NOTE — Op Note (Signed)
NAME:  Jonathan Berger, Jonathan Berger NO.:  [id number]  MEDICAL RECORD NO.:  [id number]  LOCATION:  5001                         FACILITY:  [facility]  PHYSICIAN:  Mark C. Ophelia Charter, M.D.    DATE OF BIRTH:  Dec 10, 1931  DATE OF PROCEDURE:  01/29/2011 DATE OF DISCHARGE:                              OPERATIVE REPORT   PREOPERATIVE DIAGNOSIS:  Right L4-5 recurrent herniated nucleus pulposus, foraminal and extraforaminal with radiculopathy.  POSTOP DIAGNOSIS:  Right L4-5 recurrent herniated nucleus pulposus, foraminal and extraforaminal with radiculopathy.  PROCEDURE:  Right L4-5 microdiskectomy and foraminotomy.  SURGEON:  Mark C. Ophelia Charter, M.D.  ASSISTANT:  Wende Neighbors, P.A.medically necessary and present for the entire procedure  ANESTHESIA:  GOT plus Marcaine local.  BRIEF HISTORY:  This patient had 2 previous microdiskectomies, his last one was in 06, did well until he re-presented 2 months ago with severe pain, leg weakness on the right, failed conservative treatment including epidurals, anti-inflammatories, increasing narcotic dosages, leg giving way, falling, and having used a cane to keep from falling, and also walker.  He has not been able to sleep and has been taking progressive increased dosages of Percocet, and MRI scan shows a very large foraminal, extraforaminal HNP with severe foraminal compression and disk completely filling up the lateral foramina.  PROCEDURE:  After induction of general anesthesia, the patient placed prone on chest rolls, careful padding over the shoulders, ulnar nerve, leg pumpers for DVT prophylaxis.  Prepping with DuraPrep, area was squared with towels, Betadine, Steri-Drape after sterile skin marker in the usual laminectomy sheets and draped.  Time-out procedure was completed.  Vancomycin was given IV in addition to Ancef due to his nasal swab culture being positive for MRSA.  Incision was made based on palpable landmarks in this thin  patient. Once the laminotomy defect and some lamina regrowth was identified, small Penfield 4 was placed at the inferior aspect of the regrowth area of the lamina.  A cross-table lateral x-ray confirmed that was just below the level of disk space.  The lamina was thinned with the bur, and the Kerrison was used after plain was developed underneath between the ligament and the lamina.  Laminotomy was performed removing bone at the level of the pedicle.  There were large chunks of ligament stuck to the dura.  Continued removal ligament falling down the lateral wall, palpation of the pedicle below with the identification of the nerve root at this level, and once the floor of the canal was encountered, there was obvious pieces of disk already present along the lateral wall and in the foramina.  These pieces were teased with nerve hooks and multiple large chunks were removed.  The Penfield 4 was taken with an x-ray confirming that it was inside the L4-5 disk space.  Multiple chunks of disk removed, continued work was used to tease fragments from the foramina pushing down into the middle of the disk and then remove them. Using a ball-tip nerve hook, long nerve hook titanium microdissection sets on the black nerve hook and continued work until numerous large chunks of disks were pulled from the foramina.  The pedicle above was able to be palpated with the  tip of the hockey-stick as well as the nerve root above.  It was free and there was no longer soft tissue disk herniation present in the foramina.  Continued passes were made straight laterally out with the disk underneath the facet and then pushing down to the middle of the disk, some remaining pieces were delivered.  After irrigation with saline solution, final repeat passes were made with straight pituitary up and down, micropituitaries up and down, regular pituitaries hockey-stick, Epstein curettes.  The dura was intact.  There was still  some adherence to the dura in the midline at the level of the disk where there were some up sweep bone spurs above and below.  While passing hockey-stick out straight to the right lateral to L4-5 space, they will be swung 180 degrees and only would meet the under surface of the pedicles.  No areas of compression or any remaining soft tissue disk present.  Nerve root was palpated out laterally underneath the facet and was freed.  Standard layered closure with 0 Vicryl and deep fascia 2-0 Vicryl and skin closure.  Postop dressing.  Instrument count, needle count was correct.     Mark C. Ophelia Charter, M.D.     MCY/MEDQ  D:  01/29/2011  T:  01/30/2011  Job:  [id number]

## 2011-01-30 NOTE — Discharge Summary (Signed)
Patient ID: Jonathan Berger MRN: [id number] DOB/AGE: 04-25-1931 77 y.o.  Admit date: 01/29/2011 Discharge date: 01/30/2011  Admission Diagnoses:  Principal Problem:  *Herniated nucleus pulposus, L4-5 right    recurrent  Discharge Diagnoses:  Same  PCR screen positive for MRSA, pt received Vancomycin IV and Mupiricin nasal ointment Past Medical History  Diagnosis Date  . Hypertension   . Anemia     Surgeries: Procedure(s): MICRODISCECTOMY LUMBAR LAMINECTOMY on 01/29/2011 right L4-5 for recurrent HNP   Consultants:  none  Discharged Condition: Improved  Hospital Course: Jonathan Berger is an 77 y.o. male who was admitted 01/29/2011 for operative treatment ofHerniated nucleus pulposus, L4-5 right. Patient has severe unremitting pain that affects sleep, daily activities, and work/hobbies. After pre-op clearance the patient was taken to the operating room on 01/29/2011 and underwent  Procedure(s): MICRODISCECTOMY LUMBAR LAMINECTOMY.    Patient was given perioperative antibiotics: Anti-infectives     Start     Dose/Rate Route Frequency Ordered Stop   01/29/11 2230   vancomycin (VANCOCIN) IVPB 1000 mg/200 mL premix        1,000 mg 200 mL/hr over 60 Minutes Intravenous  Once 01/29/11 1530 01/29/11 2230   01/29/11 0945   vancomycin (VANCOCIN) IVPB 1000 mg/200 mL premix        1,000 mg 200 mL/hr over 60 Minutes Intravenous  Once 01/29/11 0939 01/29/11 1019   01/28/11 0915   ceFAZolin (ANCEF) IVPB 1 g/50 mL premix        1 g 100 mL/hr over 30 Minutes Intravenous 60 min pre-op 01/28/11 0901 01/29/11 1030           Patient was given sequential compression devices, early ambulation, and chemoprophylaxis to prevent DVT.  Patient benefited maximally from hospital stay and there were no complications.    Recent vital signs: Patient Vitals for the past 24 hrs:  BP Temp Temp src Pulse Resp SpO2  01/30/11 0537 138/81 mmHg 98.3 F (36.8 C) Oral 75  18  97 %  01/29/11 2300  115/75 mmHg 98.5 F (36.9 C) - 98  18  98 %  01/29/11 1349 - 98.5 F (36.9 C) - - - -  01/29/11 1230 - 98.3 F (36.8 C) - - - -     Recent laboratory studies: No results found for this basename: WBC:2,HGB:2,HCT:2,PLT:2,NA:2,K:2,CL:2,CO2:2,BUN:2,CREATININE:2,GLUCOSE:2,PT:2,INR:2,CALCIUM,2: in the last 72 hours   Discharge Medications:  Current Discharge Medication List    START taking these medications   Details  oxyCODONE-acetaminophen (PERCOCET) 5-325 MG per tablet Take 1-2 tablets by mouth every 4 (four) hours as needed. Qty: 30 tablet, Refills: 0      CONTINUE these medications which have NOT CHANGED   Details  amLODipine-valsartan (EXFORGE) 5-320 MG per tablet Take 1 tablet by mouth daily.      IRON PO Take 1 tablet by mouth daily.          Diagnostic Studies: Dg Chest 2 View  01/26/2011  *RADIOLOGY REPORT*  Clinical Data: Preop.  CHEST - 2 VIEW  Comparison: 12/31/2005.  Findings: Trachea is midline.  Heart size is accentuated by somewhat low lung volumes.  Probable bibasilar atelectasis, which resolves on the lateral view, where the lungs appear mildly hyperinflated.  No pleural fluid.  IMPRESSION: COPD with bibasilar atelectasis.  Original Report Authenticated By: Reyes Ivan, M.D.   Dg Lumbar Spine 2-3 Views  01/29/2011  *RADIOLOGY REPORT*  Clinical Data: Lumbar microdiskectomy.  LUMBAR SPINE - 2-3 VIEW  Comparison: MRI lumbar spine 01/05/2011.  Findings:  We are provided with two intraoperative fluoroscopic spot views of the lumbar spine in the lateral projection.  On film labeled #1, metallic probe is at the level of the L5 pedicles.  On film labeled #2, the L4-5 level is localized.  IMPRESSION: L4-5 localization.  Original Report Authenticated By: Bernadene Bell. Maricela Curet, M.D.   Mr Lumbar Spine W Wo Contrast  01/05/2011  *RADIOLOGY REPORT*  Clinical Data: Right hip pain.  Chronic low back pain.  Right leg pain.  History of lumbar spinal surgery 2006.  MRI LUMBAR SPINE  WITHOUT AND WITH CONTRAST  Technique:  Multiplanar and multiecho pulse sequences of the lumbar spine were obtained without and with intravenous contrast.  Contrast: 16mL MULTIHANCE GADOBENATE DIMEGLUMINE 529 MG/ML IV SOLN  Comparison: 06/08/2004.  Findings: Straightening and slight reversal of the normal lumbar lordosis. The numbering convention used for this exam terms L5-S1 as the last full intervertebral disc space above the sacrum.  Large cystic lesions are present in both kidneys, left greater than right, probably representing renal cysts.  The spinal cord terminates posterior to the L1 vertebral body.  Multiple Schmorl's nodes are present. There is heterogenous marrow with loss of normal fatty marrow signal, which is a nonspecific finding. It is commonly associated with anemia, chronic disease, obesity, cigarette smoking.  T12-L1:  Disc desiccation and bulging without stenosis.  L1-L2:  Shallow broad-based disc bulge without stenosis.  L2-L3:  Disc desiccation and degeneration with shallow bulging.  No stenosis.  L3-L4:  Shallow circumferential disc bulging without stenosis.  L4-L5:  Desiccated and degenerated disc. There is a focal right foraminal disc extrusion which is large and completely fills the right neural foramen, compressing the right L4 nerve.  Central canal and lateral recesses appear patent.  Question prior right laminotomy.  L5-S1:  Severe disc desiccation and degeneration with loss of height and endplate osteophytes.  Central canal and lateral recesses appear patent.  Foramina patent.  IMPRESSION: Focal right L4-L5 foraminal disc extrusion completely filling the right neural foramen compressing the right L4 nerve.  Question prior right L4 laminotomy.  Original Report Authenticated By: Andreas Newport, M.D.    Disposition:   Discharge Orders    Future Orders Please Complete By Expires   Diet - low sodium heart healthy      Call MD / Call 911      Comments:   If you experience chest pain  or shortness of breath, CALL 911 and be transported to the hospital emergency room.  If you develope a fever above 101 F, pus (white drainage) or increased drainage or redness at the wound, or calf pain, call your surgeon's office.   Constipation Prevention      Comments:   Drink plenty of fluids.  Prune juice may be helpful.  You may use a stool softener, such as Colace (over the counter) 100 mg twice a day.  Use MiraLax (over the counter) for constipation as needed.   Increase activity slowly as tolerated      Discharge instructions      Comments:   Change dressing daily or as needed.  Keep wound dry and clean.  Walk as tolerated.  No bending lifting or twisting   Driving restrictions      Comments:   No driving   Lifting restrictions      Comments:   No lifting        Follow-up Information    Follow up with YATES,MARK C. Make an appointment in 1 week.  Contact information:   [facility] [address] [place] [address] [phone]           Signed: Wende Berger 01/30/2011, 11:48 AM

## 2011-01-30 NOTE — Progress Notes (Signed)
Subjective: 1 Day Post-Op Procedure(s) (LRB): MICRODISCECTOMY LUMBAR LAMINECTOMY (Right) Patient reports pain as mild.   Back is sore.  No leg pain Objective: Vital signs in last 24 hours: Temp:  [98.3 F (36.8 C)-98.5 F (36.9 C)] 98.3 F (36.8 C) (12/18 0537) Pulse Rate:  [75-98] 75  (12/18 0537) Resp:  [18] 18  (12/18 0537) BP: (115-138)/(75-81) 138/81 mmHg (12/18 0537) SpO2:  [97 %-98 %] 97 % (12/18 0537)  Intake/Output from previous day: 12/17 0701 - 12/18 0700 In: 3600 [P.O.:480; I.V.:3120] Out: 950 [Urine:950] Intake/Output this shift:    No results found for this basename: HGB:5 in the last 72 hours No results found for this basename: WBC:2,RBC:2,HCT:2,PLT:2 in the last 72 hours No results found for this basename: NA:2,K:2,CL:2,CO2:2,BUN:2,CREATININE:2,GLUCOSE:2,CALCIUM:2 in the last 72 hours No results found for this basename: LABPT:2,INR:2 in the last 72 hours  Neurovascular intact Intact pulses distally Dorsiflexion/Plantar flexion intact Incision: no drainage  Assessment/Plan: 1 Day Post-Op Procedure(s) (LRB): MICRODISCECTOMY LUMBAR LAMINECTOMY (Right) Advance diet D/C IV fluids Discharge home with home health( no home needs) . Follow up in 1 week with dr yates rx percocet Jonathan Berger 01/30/2011, 12:04 PM

## 2011-04-10 ENCOUNTER — Other Ambulatory Visit: Payer: Self-pay | Admitting: Orthopaedic Surgery

## 2011-04-10 DIAGNOSIS — M545 Low back pain: Secondary | ICD-10-CM

## 2011-04-16 ENCOUNTER — Ambulatory Visit
Admission: RE | Admit: 2011-04-16 | Discharge: 2011-04-16 | Disposition: A | Discharge status: Home / Self Care | Payer: Medicare Other | Source: Ambulatory Visit | Attending: Orthopaedic Surgery | Admitting: Orthopaedic Surgery

## 2011-04-16 DIAGNOSIS — M545 Low back pain: Secondary | ICD-10-CM

## 2011-04-16 MED ORDER — GADOBENATE DIMEGLUMINE 529 MG/ML IV SOLN
15.0000 mL | Freq: Once | INTRAVENOUS | Status: AC | PRN
  Administered 2011-04-16: 15 mL via INTRAVENOUS

## 2011-04-20 ENCOUNTER — Other Ambulatory Visit (HOSPITAL_COMMUNITY): Payer: Self-pay | Admitting: Orthopaedic Surgery

## 2011-04-27 ENCOUNTER — Encounter (HOSPITAL_COMMUNITY): Payer: Self-pay | Admitting: Pharmacy Technician

## 2011-04-30 ENCOUNTER — Encounter (HOSPITAL_COMMUNITY)
Admission: RE | Admit: 2011-04-30 | Discharge: 2011-04-30 | Disposition: A | Discharge status: Home / Self Care | Payer: Medicare Other | Source: Ambulatory Visit | Attending: Orthopaedic Surgery | Admitting: Orthopaedic Surgery

## 2011-04-30 ENCOUNTER — Encounter (HOSPITAL_COMMUNITY): Payer: Self-pay

## 2011-04-30 HISTORY — DX: Inflammatory liver disease, unspecified: K75.9

## 2011-04-30 HISTORY — DX: Dorsalgia, unspecified: M54.9

## 2011-04-30 HISTORY — DX: Other chronic pain: G89.29

## 2011-04-30 HISTORY — DX: Personal history of urinary calculi: Z87.442

## 2011-04-30 LAB — SURGICAL PCR SCREEN
MRSA, PCR: NEGATIVE
Staphylococcus aureus: NEGATIVE

## 2011-04-30 LAB — TYPE AND SCREEN

## 2011-04-30 LAB — ABO/RH: ABO/RH(D): O POS

## 2011-04-30 LAB — CBC
HCT: 36.3 % — ABNORMAL LOW (ref 39.0–52.0)
Hemoglobin: 12.2 g/dL — ABNORMAL LOW (ref 13.0–17.0)
MCH: 32 pg (ref 26.0–34.0)
MCHC: 33.6 g/dL (ref 30.0–36.0)
MCV: 95.3 fL (ref 78.0–100.0)
RBC: 3.81 MIL/uL — ABNORMAL LOW (ref 4.22–5.81)

## 2011-04-30 LAB — COMPREHENSIVE METABOLIC PANEL
ALT: 15 U/L (ref 0–53)
Alkaline Phosphatase: 51 U/L (ref 39–117)
BUN: 20 mg/dL (ref 6–23)
CO2: 27 mEq/L (ref 19–32)
Calcium: 10.2 mg/dL (ref 8.4–10.5)
GFR calc Af Amer: 77 mL/min — ABNORMAL LOW (ref >90)
GFR calc non Af Amer: 67 mL/min — ABNORMAL LOW (ref >90)
Glucose, Bld: 102 mg/dL — ABNORMAL HIGH (ref 70–99)
Total Protein: 6.5 g/dL (ref 6.0–8.3)

## 2011-04-30 LAB — PROTIME-INR: Prothrombin Time: 12 seconds (ref 11.6–15.2)

## 2011-04-30 NOTE — Progress Notes (Signed)
Pt doesn't have a cardiologist  Medical MD is Dr.Robert Kevan Ny manages HTN  Denies ever having a heart cath/echo/stress test

## 2011-04-30 NOTE — Pre-Procedure Instructions (Signed)
20 Jonathan Berger  04/30/2011   Your procedure is scheduled on:  Mon, Mar 25 @ 1230 PM  Report to [facility] at 1030 AM.  Call this number if you have problems the morning of surgery: [phone]   Remember:   Do not eat food:After Midnight.  May have clear liquids: up to 4 Hours before arrival.(until 6:30 am)  Clear liquids include soda, tea, black coffee, apple or grape juice, broth,water  Take these medicines the morning of surgery with A SIP OF WATER:    Do not wear jewelry, make-up or nail polish.  Do not wear lotions, powders, or perfumes.   Do not bring valuables to the hospital.  Contacts, dentures or bridgework may not be worn into surgery.  Leave suitcase in the car. After surgery it may be brought to your room.  For patients admitted to the hospital, checkout time is 11:00 AM the day of discharge.       Special Instructions: Incentive Spirometry - Practice and bring it with you on the day of surgery. and CHG Shower Use Special Wash: 1/2 bottle night before surgery and 1/2 bottle morning of surgery.   Please read over the following fact sheets that you were given: Pain Booklet, Coughing and Deep Breathing, Blood Transfusion Information, MRSA Information and Surgical Site Infection Prevention

## 2011-05-03 NOTE — H&P (Signed)
PATIENT: Jonathan Berger, Jonathan Berger         CHIEF COMPLAINT:  Back pain and right leg pain.     HISTORY OF PRESENT ILLNESS:  Mr. Pondexter is a 78 year old male, who is status post microdiskectomy right L4-5 for a recurrent herniated nucleus pulposus in December of 2012.  He reports that since surgery, he has continued to have considerable pain.  The pain is in his low back and does go down his right leg.  He is not able to walk well.  He notes that he has burning in his buttocks and radiation to the dorsum of his right foot.  He has continued to require narcotic medications on a regular basis.  He has had multiple ruptured disks at this level.  The initial one was in 1977.  He had a recurrent rupture in 2006 and then his recurrent rupture in December of 2012.  He continues to show weakness in the right EHL.  Continues to have a positive straight leg raise on the right and positive popliteal compression test.  An MRI was performed on 04/17/2011.  Findings indicate postop changes at the right L4-5 level where a large disk protrusion encroaches on the descending right L5 and exiting right L4 nerve roots.  This scan was reviewed with the patient today by Dr. Ophelia Charter, as he is continuing to have significant leg pain and significant findings on MRI scan noting further recurrence of the disk protrusion and it was felt he would require surgical intervention.  Treatment options were discussed with the patient today at length by Dr. Ophelia Charter.     CURRENT MEDICATIONS:  Exforge 5/325 daily and iron supplement daily.   ALLERGIES:  No known drug allergies.   PAST MEDICAL HISTORY:  Positive for hypertension and anemia.   PAST SURGICAL HISTORY:  Significant for lumbar laminectomy in 1977, microdiskectomy 2006 and another microdiskectomy in 2012.  These were all performed at the L4-5 level.     SOCIAL HISTORY:  He is married and lives with his wife.  He currently has no intake of tobacco, but was a previous smoker.   Currently, no intake of alcohol.  His primary care physician is Dr. Hyacinth Meeker.   FAMILY HISTORY:  Positive for hypertension.   REVIEW OF SYSTEMS:  All other review of systems negative as pertaining to history of present illness.   PHYSICAL EXAMINATION:  He is a well-developed, well-nourished male.  Height 5 feet 11 inches, weight 170 pounds.  He is 78 years of age.  HEENT:  Normocephalic atraumatic.  Pupils equal, round and reactive to light and accomodation.  Extraocular movements intact.  Neck:  Supple.  No adenopathy or thyromegaly.  Lungs:  Clear to auscultation.  Heart:  Regular rate and rhythm.  No murmurs heard.  Abdomen:  Soft, nontender, bowel sounds present.  Genital/Rectal/Breast:  Not performed.  Not pertinent to the present illness.  Extremities:  Patient continues to ambulate with a flexed gait.  He has discomfort when he comes into full posture.  He has positive straight leg raise on the right and positive sciatic notch tenderness on the right.  The right lower extremity EHL is weak at 4/5 on the right and on the left is 5/5.  No other focal weakness.  Distally pulses intact with no edema in the lower extremities.  Vital signs show temperature 98.3, pulse 79, blood pressure 154/102.   ASSESSMENT:  Recurrent herniated nucleus pulposus right L4-5 with continued radicular symptoms.  This  is the patient's 4th recurrent rupture at this level.   PLAN:  For now, patient will be posted a transforaminal lumbar interbody fusion at the L4-5 level.  Pedicle screw and rod instrumentation for posterolateral fusion.  Dr. Ophelia Charter has discussed the surgery at length with the patient.  All risks and benefits discussed including, but not limited to nonunion, hardware failure, infection and possibility of bleeding.  For the procedure, a Cell Saver will be utilized.  Patient will be in the hospital possibly 2-3 nights.  He will be fit for a brace postoperatively at the hospital.  Will plan on seeing him after  the surgery 2 weeks postop.  All questions encouraged and answered.    For additional information please see handwritten notes, reports, orders and prescriptions in this chart.       Maud Deed, PA-C Mark C. Ophelia Charter, M.D.    Auto-Authenticated by Maud Deed, PA-C

## 2011-05-06 MED ORDER — CEFAZOLIN SODIUM 1-5 GM-% IV SOLN
1.0000 g | INTRAVENOUS | Status: DC
  Filled 2011-05-06: qty 50

## 2011-05-07 ENCOUNTER — Encounter (HOSPITAL_COMMUNITY): Payer: Self-pay | Admitting: Anesthesiology

## 2011-05-07 ENCOUNTER — Inpatient Hospital Stay (HOSPITAL_COMMUNITY): Payer: Medicare Other

## 2011-05-07 ENCOUNTER — Encounter (HOSPITAL_COMMUNITY): Payer: Self-pay | Admitting: *Deleted

## 2011-05-07 ENCOUNTER — Inpatient Hospital Stay (HOSPITAL_COMMUNITY)
Admission: RE | Admit: 2011-05-07 | Discharge: 2011-05-09 | DRG: 460 | Disposition: A | Discharge status: Home / Self Care | Payer: Medicare Other | Source: Ambulatory Visit | Attending: Orthopaedic Surgery | Admitting: Orthopaedic Surgery

## 2011-05-07 ENCOUNTER — Encounter (HOSPITAL_COMMUNITY): Payer: Self-pay | Admitting: Orthopedic Surgery

## 2011-05-07 ENCOUNTER — Inpatient Hospital Stay (HOSPITAL_COMMUNITY): Payer: Medicare Other | Admitting: Anesthesiology

## 2011-05-07 ENCOUNTER — Encounter (HOSPITAL_COMMUNITY)
Admission: RE | Disposition: A | Discharge status: Home / Self Care | Payer: Self-pay | Source: Ambulatory Visit | Attending: Orthopaedic Surgery

## 2011-05-07 DIAGNOSIS — I10 Essential (primary) hypertension: Secondary | ICD-10-CM | POA: Diagnosis present

## 2011-05-07 DIAGNOSIS — D62 Acute posthemorrhagic anemia: Secondary | ICD-10-CM | POA: Diagnosis not present

## 2011-05-07 DIAGNOSIS — Z87442 Personal history of urinary calculi: Secondary | ICD-10-CM

## 2011-05-07 DIAGNOSIS — M5126 Other intervertebral disc displacement, lumbar region: Principal | ICD-10-CM | POA: Diagnosis present

## 2011-05-07 LAB — URINALYSIS, ROUTINE W REFLEX MICROSCOPIC
Bilirubin Urine: NEGATIVE
Nitrite: NEGATIVE
Specific Gravity, Urine: 1.014 (ref 1.005–1.030)
Urobilinogen, UA: 0.2 mg/dL (ref 0.0–1.0)
pH: 6.5 (ref 5.0–8.0)

## 2011-05-07 SURGERY — POSTERIOR LUMBAR FUSION 1 LEVEL
Anesthesia: General | Site: Back | Laterality: Right

## 2011-05-07 MED ORDER — ONDANSETRON HCL 4 MG/2ML IJ SOLN: 4.0000 mg | Freq: Four times a day (QID) | INTRAMUSCULAR | Status: DC | PRN

## 2011-05-07 MED ORDER — HYDROMORPHONE HCL PF 1 MG/ML IJ SOLN
INTRAMUSCULAR | Status: AC
  Filled 2011-05-07: qty 1

## 2011-05-07 MED ORDER — HYDROMORPHONE HCL PF 1 MG/ML IJ SOLN
0.2500 mg | INTRAMUSCULAR | Status: DC | PRN
  Administered 2011-05-07 (×4): 0.5 mg via INTRAVENOUS

## 2011-05-07 MED ORDER — METHOCARBAMOL 500 MG PO TABS
500.0000 mg | ORAL_TABLET | Freq: Four times a day (QID) | ORAL | Status: DC | PRN
  Administered 2011-05-09: 500 mg via ORAL
  Filled 2011-05-07: qty 1

## 2011-05-07 MED ORDER — SODIUM CHLORIDE 0.9 % IJ SOLN: 9.0000 mL | INTRAMUSCULAR | Status: DC | PRN

## 2011-05-07 MED ORDER — ACETAMINOPHEN 650 MG RE SUPP: 650.0000 mg | RECTAL | Status: DC | PRN

## 2011-05-07 MED ORDER — CEFAZOLIN SODIUM 1-5 GM-% IV SOLN
INTRAVENOUS | Status: AC
  Filled 2011-05-07: qty 100

## 2011-05-07 MED ORDER — BUPIVACAINE-EPINEPHRINE 0.5% -1:200000 IJ SOLN
INTRAMUSCULAR | Status: DC | PRN
  Administered 2011-05-07: 10 mL

## 2011-05-07 MED ORDER — NEOSTIGMINE METHYLSULFATE 1 MG/ML IJ SOLN
INTRAMUSCULAR | Status: DC | PRN
  Administered 2011-05-07: 4 mg via INTRAVENOUS

## 2011-05-07 MED ORDER — CEFAZOLIN SODIUM-DEXTROSE 2-3 GM-% IV SOLR
2.0000 g | INTRAVENOUS | Status: AC
  Administered 2011-05-07: 2 g via INTRAVENOUS
  Filled 2011-05-07: qty 50

## 2011-05-07 MED ORDER — KETOROLAC TROMETHAMINE 30 MG/ML IJ SOLN
30.0000 mg | Freq: Once | INTRAMUSCULAR | Status: AC
  Administered 2011-05-07: 30 mg via INTRAVENOUS

## 2011-05-07 MED ORDER — ROCURONIUM BROMIDE 100 MG/10ML IV SOLN
INTRAVENOUS | Status: DC | PRN
  Administered 2011-05-07 (×2): 50 mg via INTRAVENOUS

## 2011-05-07 MED ORDER — HYDROCHLOROTHIAZIDE 10 MG/ML ORAL SUSPENSION: 6.2500 mg | Freq: Every day | ORAL | Status: DC

## 2011-05-07 MED ORDER — GLYCOPYRROLATE 0.2 MG/ML IJ SOLN
INTRAMUSCULAR | Status: DC | PRN
  Administered 2011-05-07: 0.6 mg via INTRAVENOUS

## 2011-05-07 MED ORDER — FLEET ENEMA 7-19 GM/118ML RE ENEM: 1.0000 | ENEMA | Freq: Once | RECTAL | Status: AC | PRN

## 2011-05-07 MED ORDER — DIPHENHYDRAMINE HCL 12.5 MG/5ML PO ELIX
12.5000 mg | ORAL_SOLUTION | Freq: Four times a day (QID) | ORAL | Status: DC | PRN
  Administered 2011-05-08: 12.5 mg via ORAL
  Filled 2011-05-07: qty 10

## 2011-05-07 MED ORDER — PHENOL 1.4 % MT LIQD: 1.0000 | OROMUCOSAL | Status: DC | PRN

## 2011-05-07 MED ORDER — DOCUSATE SODIUM 100 MG PO CAPS
100.0000 mg | ORAL_CAPSULE | Freq: Two times a day (BID) | ORAL | Status: DC
  Administered 2011-05-07 – 2011-05-09 (×4): 100 mg via ORAL
  Filled 2011-05-07 (×6): qty 1

## 2011-05-07 MED ORDER — SODIUM CHLORIDE 0.9 % IJ SOLN: 3.0000 mL | Freq: Two times a day (BID) | INTRAMUSCULAR | Status: DC

## 2011-05-07 MED ORDER — AMLODIPINE-VALSARTAN-HCTZ 5-160-12.5 MG PO TABS
0.5000 | ORAL_TABLET | Freq: Every day | ORAL | Status: DC
  Administered 2011-05-08 – 2011-05-09 (×2): 0.5 via ORAL
  Filled 2011-05-07 (×2): qty 1

## 2011-05-07 MED ORDER — SODIUM CHLORIDE 0.9 % IV SOLN: 250.0000 mL | INTRAVENOUS | Status: DC

## 2011-05-07 MED ORDER — AMLODIPINE-VALSARTAN-HCTZ 10-160-12.5 MG PO TABS: 0.5000 | ORAL_TABLET | Freq: Every day | ORAL | Status: DC

## 2011-05-07 MED ORDER — EPHEDRINE SULFATE 50 MG/ML IJ SOLN
INTRAMUSCULAR | Status: DC | PRN
  Administered 2011-05-07 (×2): 10 mg via INTRAVENOUS

## 2011-05-07 MED ORDER — MORPHINE SULFATE (PF) 1 MG/ML IV SOLN
INTRAVENOUS | Status: AC
  Administered 2011-05-07: 21:00:00
  Filled 2011-05-07: qty 25

## 2011-05-07 MED ORDER — GELATIN ABSORBABLE MT POWD
OROMUCOSAL | Status: DC | PRN
  Administered 2011-05-07: 14:00:00 via TOPICAL

## 2011-05-07 MED ORDER — ACETAMINOPHEN 325 MG PO TABS: 650.0000 mg | ORAL_TABLET | ORAL | Status: DC | PRN

## 2011-05-07 MED ORDER — PANTOPRAZOLE SODIUM 40 MG IV SOLR
40.0000 mg | Freq: Every day | INTRAVENOUS | Status: DC
  Administered 2011-05-07: 40 mg via INTRAVENOUS
  Filled 2011-05-07 (×2): qty 40

## 2011-05-07 MED ORDER — CEFAZOLIN SODIUM 1-5 GM-% IV SOLN
1.0000 g | Freq: Three times a day (TID) | INTRAVENOUS | Status: AC
  Administered 2011-05-07 – 2011-05-08 (×2): 1 g via INTRAVENOUS
  Filled 2011-05-07 (×3): qty 50

## 2011-05-07 MED ORDER — SODIUM CHLORIDE 0.9 % IJ SOLN: 3.0000 mL | INTRAMUSCULAR | Status: DC | PRN

## 2011-05-07 MED ORDER — ONDANSETRON HCL 4 MG/2ML IJ SOLN: 4.0000 mg | INTRAMUSCULAR | Status: DC | PRN

## 2011-05-07 MED ORDER — BISACODYL 10 MG RE SUPP
10.0000 mg | Freq: Every day | RECTAL | Status: DC | PRN
  Administered 2011-05-08: 10 mg via RECTAL
  Filled 2011-05-07: qty 1

## 2011-05-07 MED ORDER — METHOCARBAMOL 100 MG/ML IJ SOLN
500.0000 mg | Freq: Four times a day (QID) | INTRAVENOUS | Status: DC | PRN
  Filled 2011-05-07: qty 5

## 2011-05-07 MED ORDER — SENNOSIDES-DOCUSATE SODIUM 8.6-50 MG PO TABS: 1.0000 | ORAL_TABLET | Freq: Every evening | ORAL | Status: DC | PRN

## 2011-05-07 MED ORDER — 0.9 % SODIUM CHLORIDE (POUR BTL) OPTIME
TOPICAL | Status: DC | PRN
  Administered 2011-05-07: 1000 mL

## 2011-05-07 MED ORDER — LACTATED RINGERS IV SOLN
INTRAVENOUS | Status: DC | PRN
  Administered 2011-05-07 (×3): via INTRAVENOUS

## 2011-05-07 MED ORDER — AMLODIPINE BESYLATE 5 MG PO TABS: 5.0000 mg | ORAL_TABLET | Freq: Every day | ORAL | Status: DC

## 2011-05-07 MED ORDER — NALOXONE HCL 0.4 MG/ML IJ SOLN: 0.4000 mg | INTRAMUSCULAR | Status: DC | PRN

## 2011-05-07 MED ORDER — METHOCARBAMOL 100 MG/ML IJ SOLN
500.0000 mg | INTRAMUSCULAR | Status: AC
  Administered 2011-05-07: 500 mg via INTRAVENOUS
  Filled 2011-05-07: qty 5

## 2011-05-07 MED ORDER — IRBESARTAN 75 MG PO TABS: 75.0000 mg | ORAL_TABLET | Freq: Every day | ORAL | Status: DC

## 2011-05-07 MED ORDER — ONDANSETRON HCL 4 MG/2ML IJ SOLN
INTRAMUSCULAR | Status: DC | PRN
  Administered 2011-05-07: 4 mg via INTRAVENOUS

## 2011-05-07 MED ORDER — FENTANYL CITRATE 0.05 MG/ML IJ SOLN
INTRAMUSCULAR | Status: DC | PRN
  Administered 2011-05-07: 25 ug via INTRAVENOUS
  Administered 2011-05-07: 250 ug via INTRAVENOUS
  Administered 2011-05-07: 75 ug via INTRAVENOUS
  Administered 2011-05-07: 100 ug via INTRAVENOUS
  Administered 2011-05-07: 50 ug via INTRAVENOUS

## 2011-05-07 MED ORDER — LACTATED RINGERS IV SOLN
INTRAVENOUS | Status: DC
  Administered 2011-05-07: 12:00:00 via INTRAVENOUS

## 2011-05-07 MED ORDER — MORPHINE SULFATE (PF) 1 MG/ML IV SOLN
INTRAVENOUS | Status: DC
Start: 2011-05-07 — End: 2011-05-08
  Administered 2011-05-07: 16:00:00 via INTRAVENOUS
  Administered 2011-05-07: 13.5 mg via INTRAVENOUS
  Administered 2011-05-08: 13.2 mg via INTRAVENOUS
  Administered 2011-05-08: 21 mg via INTRAVENOUS
  Administered 2011-05-08: 09:00:00 via INTRAVENOUS

## 2011-05-07 MED ORDER — OXYCODONE-ACETAMINOPHEN 5-325 MG PO TABS
1.0000 | ORAL_TABLET | ORAL | Status: DC | PRN
  Administered 2011-05-08 – 2011-05-09 (×6): 2 via ORAL
  Filled 2011-05-07 (×6): qty 2

## 2011-05-07 MED ORDER — PROPOFOL 10 MG/ML IV EMUL
INTRAVENOUS | Status: DC | PRN
  Administered 2011-05-07: 200 mg via INTRAVENOUS

## 2011-05-07 MED ORDER — DIPHENHYDRAMINE HCL 50 MG/ML IJ SOLN: 12.5000 mg | Freq: Four times a day (QID) | INTRAMUSCULAR | Status: DC | PRN

## 2011-05-07 MED ORDER — MENTHOL 3 MG MT LOZG: 1.0000 | LOZENGE | OROMUCOSAL | Status: DC | PRN

## 2011-05-07 MED ORDER — KCL IN DEXTROSE-NACL 20-5-0.45 MEQ/L-%-% IV SOLN
INTRAVENOUS | Status: DC
  Administered 2011-05-07 – 2011-05-09 (×3): via INTRAVENOUS
  Filled 2011-05-07 (×7): qty 1000

## 2011-05-07 SURGICAL SUPPLY — 60 items
BENZOIN TINCTURE PRP APPL 2/3 (GAUZE/BANDAGES/DRESSINGS) ×2 IMPLANT
BLADE SURG ROTATE 9660 (MISCELLANEOUS) ×2 IMPLANT
BUR ROUND FLUTED 4 SOFT TCH (BURR) ×2 IMPLANT
CLOTH BEACON ORANGE TIMEOUT ST (SAFETY) ×2 IMPLANT
CORDS BIPOLAR (ELECTRODE) ×2 IMPLANT
COVER SURGICAL LIGHT HANDLE (MISCELLANEOUS) ×2 IMPLANT
DRAPE C-ARM 42X72 X-RAY (DRAPES) ×2 IMPLANT
DRAPE MICROSCOPE LEICA (MISCELLANEOUS) ×2 IMPLANT
DRAPE SURG 17X23 STRL (DRAPES) ×6 IMPLANT
DRAPE TABLE COVER HEAVY DUTY (DRAPES) ×2 IMPLANT
DRSG EMULSION OIL 3X3 NADH (GAUZE/BANDAGES/DRESSINGS) IMPLANT
DRSG PAD ABDOMINAL 8X10 ST (GAUZE/BANDAGES/DRESSINGS) ×2 IMPLANT
DURAPREP 26ML APPLICATOR (WOUND CARE) ×2 IMPLANT
ELECT BLADE 4.0 EZ CLEAN MEGAD (MISCELLANEOUS) ×2
ELECT CAUTERY BLADE 6.4 (BLADE) ×2 IMPLANT
ELECT REM PT RETURN 9FT ADLT (ELECTROSURGICAL) ×2
ELECTRODE BLDE 4.0 EZ CLN MEGD (MISCELLANEOUS) ×1 IMPLANT
ELECTRODE REM PT RTRN 9FT ADLT (ELECTROSURGICAL) ×1 IMPLANT
EVACUATOR 1/8 PVC DRAIN (DRAIN) IMPLANT
GLOVE BIOGEL PI IND STRL 7.5 (GLOVE) ×1 IMPLANT
GLOVE BIOGEL PI IND STRL 8 (GLOVE) ×1 IMPLANT
GLOVE BIOGEL PI INDICATOR 7.5 (GLOVE) ×1
GLOVE BIOGEL PI INDICATOR 8 (GLOVE) ×1
GLOVE ECLIPSE 7.0 STRL STRAW (GLOVE) ×2 IMPLANT
GLOVE ORTHO TXT STRL SZ7.5 (GLOVE) ×2 IMPLANT
GOWN PREVENTION PLUS LG XLONG (DISPOSABLE) IMPLANT
GOWN STRL NON-REIN LRG LVL3 (GOWN DISPOSABLE) ×6 IMPLANT
HEMOSTAT SURGICEL 2X14 (HEMOSTASIS) IMPLANT
KIT BASIN OR (CUSTOM PROCEDURE TRAY) ×2 IMPLANT
KIT ROOM TURNOVER OR (KITS) ×2 IMPLANT
LUMBAR L1 POLARIS 6.35 FUSION (Orthopedic Implant) ×2 IMPLANT
MANIFOLD NEPTUNE II (INSTRUMENTS) ×2 IMPLANT
NDL SUT .5 MAYO 1.404X.05X (NEEDLE) ×1 IMPLANT
NEEDLE HYPO 25X1 1.5 SAFETY (NEEDLE) ×2 IMPLANT
NEEDLE MAYO TAPER (NEEDLE) ×1
NS IRRIG 1000ML POUR BTL (IV SOLUTION) ×2 IMPLANT
PACK LAMINECTOMY ORTHO (CUSTOM PROCEDURE TRAY) ×2 IMPLANT
PAD ARMBOARD 7.5X6 YLW CONV (MISCELLANEOUS) ×4 IMPLANT
PATTIES SURGICAL .5 X.5 (GAUZE/BANDAGES/DRESSINGS) IMPLANT
PATTIES SURGICAL .75X.75 (GAUZE/BANDAGES/DRESSINGS) IMPLANT
SPACER LORDOTIC IBEX 9MM (Orthopedic Implant) ×2 IMPLANT
SPONGE GAUZE 4X4 12PLY (GAUZE/BANDAGES/DRESSINGS) ×2 IMPLANT
SPONGE LAP 18X18 X RAY DECT (DISPOSABLE) IMPLANT
SPONGE LAP 4X18 X RAY DECT (DISPOSABLE) IMPLANT
SPONGE SURGIFOAM ABS GEL 100 (HEMOSTASIS) ×2 IMPLANT
STAPLER VISISTAT 35W (STAPLE) IMPLANT
STRIP CLOSURE SKIN 1/2X4 (GAUZE/BANDAGES/DRESSINGS) ×6 IMPLANT
SURGIFLO TRUKIT (HEMOSTASIS) IMPLANT
SUT BONE WAX W31G (SUTURE) IMPLANT
SUT VIC AB 2-0 CT1 27 (SUTURE) ×1
SUT VIC AB 2-0 CT1 TAPERPNT 27 (SUTURE) ×1 IMPLANT
SUT VICRYL 0 TIES 12 18 (SUTURE) ×2 IMPLANT
SUT VICRYL 4-0 PS2 18IN ABS (SUTURE) IMPLANT
SUT VICRYL AB 2 0 TIES (SUTURE) ×2 IMPLANT
SYR CONTROL 10ML LL (SYRINGE) ×2 IMPLANT
TOWEL OR 17X24 6PK STRL BLUE (TOWEL DISPOSABLE) ×2 IMPLANT
TOWEL OR 17X26 10 PK STRL BLUE (TOWEL DISPOSABLE) ×2 IMPLANT
TRAY FOLEY CATH 14FR (SET/KITS/TRAYS/PACK) ×2 IMPLANT
WATER STERILE IRR 1000ML POUR (IV SOLUTION) ×2 IMPLANT
YANKAUER SUCT BULB TIP NO VENT (SUCTIONS) ×2 IMPLANT

## 2011-05-07 NOTE — Progress Notes (Signed)
Orthopedic Tech Progress Note Patient Details:  Jonathan Berger 1931-07-20 [id number]  Patient ID: Jonathan Berger, male   DOB: 01/02/1932, 78 y.o.   MRN: [id number] Order completed by Storm Frisk, Aleida Crandell 05/07/2011, 7:04 PM

## 2011-05-07 NOTE — Brief Op Note (Cosign Needed)
05/07/2011  3:57 PM  PATIENT:  Jonathan Berger  78 y.o. male  PRE-OPERATIVE DIAGNOSIS:  Recurrent Right L4-5 HNP  POST-OPERATIVE DIAGNOSIS:  Recurrent right lumbar four- lumbar five herniated nucleic pulposis   PROCEDURE:  Procedure(s) (LRB): POSTERIOR LUMBAR FUSION 1 LEVEL (Right)L4-5  SURGEON:  Surgeon(s) and Role:    * Eldred Manges, MD - Primary  PHYSICIAN ASSISTANT: Maud Deed [facility]  ASSISTANTS: none   ANESTHESIA:   general  EBL:  Total I/O In: 2000 [I.V.:2000] Out: 700 [Urine:550; Blood:150]  BLOOD ADMINISTERED:none  DRAINS: none   LOCAL MEDICATIONS USED:  MARCAINE     SPECIMEN:  No Specimen  DISPOSITION OF SPECIMEN:  N/A  COUNTS:  YES  TOURNIQUET:  * No tourniquets in log *  DICTATION: .Note written in EPIC  PLAN OF CARE: Admit to inpatient   PATIENT DISPOSITION:  PACU - hemodynamically stable.   Delay start of Pharmacological VTE agent (>24hrs) due to surgical blood loss or risk of bleeding: yes

## 2011-05-07 NOTE — Anesthesia Procedure Notes (Signed)
Procedure Name: Intubation Date/Time: 05/07/2011 12:55 PM Performed by: Sharlene Dory E Pre-anesthesia Checklist: Patient identified, Emergency Drugs available, Suction available, Patient being monitored and Timeout performed Patient Re-evaluated:Patient Re-evaluated prior to inductionOxygen Delivery Method: Circle system utilized Preoxygenation: Pre-oxygenation with 100% oxygen Intubation Type: IV induction Ventilation: Mask ventilation without difficulty and Oral airway inserted - appropriate to patient size Laryngoscope Size: Mac and 4 Grade View: Grade I Tube type: Oral Tube size: 7.5 mm Number of attempts: 1 Airway Equipment and Method: Stylet Placement Confirmation: ETT inserted through vocal cords under direct vision,  positive ETCO2 and breath sounds checked- equal and bilateral Secured at: 22 cm Tube secured with: Tape Dental Injury: Teeth and Oropharynx as per pre-operative assessment

## 2011-05-07 NOTE — Anesthesia Preprocedure Evaluation (Signed)
Anesthesia Evaluation  Patient identified by MRN, date of birth, ID band Patient awake    Reviewed: Allergy & Precautions, H&P , NPO status , Patient's Chart, lab work & pertinent test results  History of Anesthesia Complications Negative for: history of anesthetic complications  Airway Mallampati: I TM Distance: >3 FB Neck ROM: Full    Dental  (+) Partial Lower and Dental Advisory Given   Pulmonary neg pulmonary ROS, former smoker (quit 50 years) breath sounds clear to auscultation  Pulmonary exam normal       Cardiovascular hypertension, Pt. on medications Rhythm:Regular Rate:Normal     Neuro/Psych HNP lumbar negative neurological ROS     GI/Hepatic negative GI ROS, Neg liver ROS,   Endo/Other  negative endocrine ROS  Renal/GU negative Renal ROS     Musculoskeletal   Abdominal   Peds  Hematology negative hematology ROS (+)   Anesthesia Other Findings   Reproductive/Obstetrics                           Anesthesia Physical Anesthesia Plan  ASA: II  Anesthesia Plan: General   Post-op Pain Management:    Induction: Intravenous  Airway Management Planned: Oral ETT  Additional Equipment:   Intra-op Plan:   Post-operative Plan: Extubation in OR  Informed Consent: I have reviewed the patients History and Physical, chart, labs and discussed the procedure including the risks, benefits and alternatives for the proposed anesthesia with the patient or authorized representative who has indicated his/her understanding and acceptance.   Dental advisory given  Plan Discussed with: Surgeon and CRNA  Anesthesia Plan Comments: (Plan routine monitors, GETA)        Anesthesia Quick Evaluation

## 2011-05-07 NOTE — Transfer of Care (Signed)
Immediate Anesthesia Transfer of Care Note  Patient: Jonathan Berger  Procedure(s) Performed: Procedure(s) (LRB): POSTERIOR LUMBAR FUSION 1 LEVEL (Right)  Patient Location: PACU  Anesthesia Type: General  Level of Consciousness: awake, alert  and oriented  Airway & Oxygen Therapy: Patient Spontanous Breathing and Patient connected to nasal cannula oxygen  Post-op Assessment: Report given to PACU RN and Post -op Vital signs reviewed and stable  Post vital signs: Reviewed and stable  Complications: No apparent anesthesia complications

## 2011-05-07 NOTE — Interval H&P Note (Signed)
History and Physical Interval Note:  05/07/2011 12:10 PM  Jonathan Berger  has presented today for surgery, with the diagnosis of Recurrent Right L4-5 HNP  The various methods of treatment have been discussed with the patient and family. After consideration of risks, benefits and other options for treatment, the patient has consented to  Procedure(s) (LRB): POSTERIOR LUMBAR FUSION 1 LEVEL (Right) as a surgical intervention .  The patients' history has been reviewed, patient examined, no change in status, stable for surgery.  I have reviewed the patients' chart and labs.  Questions were answered to the patient's satisfaction.     [name] C

## 2011-05-07 NOTE — Progress Notes (Signed)
Pt given urine cup for cc u/a   But so far not able to void.

## 2011-05-07 NOTE — Progress Notes (Signed)
Orthopedic Tech Progress Note Patient Details:  Jonathan Berger 02/21/1931 [id number]  Patient ID: Jonathan Berger, male   DOB: [birth year], 78 y.o.   MRN: [id number] Called [employer] with brace order @ 1540  Nikki Dom 05/07/2011, 5:41 PM

## 2011-05-08 LAB — CBC
MCH: 31.9 pg (ref 26.0–34.0)
MCHC: 32.6 g/dL (ref 30.0–36.0)
MCV: 98.1 fL (ref 78.0–100.0)
Platelets: 145 10*3/uL — ABNORMAL LOW (ref 150–400)
RDW: 13.9 % (ref 11.5–15.5)
WBC: 8.3 10*3/uL (ref 4.0–10.5)

## 2011-05-08 LAB — BASIC METABOLIC PANEL
Calcium: 8.9 mg/dL (ref 8.4–10.5)
Creatinine, Ser: 1.05 mg/dL (ref 0.50–1.35)
GFR calc Af Amer: 76 mL/min — ABNORMAL LOW (ref >90)
GFR calc non Af Amer: 66 mL/min — ABNORMAL LOW (ref >90)

## 2011-05-08 MED ORDER — MORPHINE SULFATE (PF) 1 MG/ML IV SOLN
INTRAVENOUS | Status: AC
  Filled 2011-05-08: qty 25

## 2011-05-08 MED ORDER — PANTOPRAZOLE SODIUM 40 MG PO TBEC
40.0000 mg | DELAYED_RELEASE_TABLET | Freq: Every day | ORAL | Status: DC
  Administered 2011-05-08: 40 mg via ORAL
  Filled 2011-05-08: qty 1

## 2011-05-08 NOTE — Evaluation (Signed)
Physical Therapy Evaluation Patient Details Name: Jonathan Berger MRN: [id number] DOB: Jan 10, 1932 Today's Date: 05/08/2011  Problem List:  Patient Active Problem List  Diagnoses  . Herniated nucleus pulposus, L4-5 right    Past Medical History:  Past Medical History  Diagnosis Date  . Anemia   . Hypertension     takes Amlodipine/Valsartan/HCTZ daily  . Chronic back pain     herniated disc  . Hepatitis     50+yrs ago  . Personal history of kidney stones    Past Surgical History:  Past Surgical History  Procedure Date  . Lumbar laminectomy 01/29/2011    Procedure: MICRODISCECTOMY LUMBAR LAMINECTOMY;  Surgeon: Eldred Manges;  Location: [facility] OR;  Service: Orthopedics;  Laterality: Right;  Right L4-5 Microdiscectomy  . Back surgery 1977/006  . Colonoscopy   . Esophagogastroduodenoscopy   . Lithotripsy   . Cystoscopy     PT Assessment/Plan/Recommendation PT Assessment Clinical Impression Statement: Pt presents with decreased strenght, gait, balance, functional mobility and will benefit from skilled PT in the acute care setting.  Pt with impulsiveness and decreased memory.  Will require 24 hour supervision at home to follow precautions and remember to wear brace.  Pt's wife educated with pt on log roll, precautions, need for brace when OOB. PT Recommendation/Assessment: Patient will need skilled PT in the acute care venue PT Problem List: Decreased strength;Decreased mobility;Decreased activity tolerance;Decreased knowledge of use of DME;Decreased balance;Decreased knowledge of precautions;Decreased safety awareness;Pain PT Therapy Diagnosis : Difficulty walking;Generalized weakness;Acute pain PT Plan PT Frequency: Min 5X/week PT Treatment/Interventions: Gait training;Stair training;Functional mobility training;Balance training;Therapeutic exercise;Therapeutic activities;Patient/family education;DME instruction PT Recommendation Follow Up Recommendations: Home health PT Equipment  Recommended: Rolling walker with 5" wheels PT Goals  Acute Rehab PT Goals PT Goal Formulation: With patient Time For Goal Achievement: 7 days Pt will go Supine/Side to Sit: with supervision Pt will Transfer Bed to Chair/Chair to Bed: with supervision Pt will Ambulate: >150 feet;with supervision Pt will Go Up / Down Stairs: Flight;with supervision  PT Evaluation Precautions/Restrictions  Precautions Precautions: Back Precaution Booklet Issued: Yes (comment) Required Braces or Orthoses: Yes Spinal Brace: Lumbar corset;Applied in sitting position Restrictions Weight Bearing Restrictions: No Prior Functioning  Home Living Lives With: Spouse Receives Help From: Family Type of Home: House Home Layout: Two level Alternate Level Stairs-Rails: Right Alternate Level Stairs-Number of Steps: 10 Home Access: Stairs to enter Entrance Stairs-Rails: None Entrance Stairs-Number of Steps: 2-3 Bathroom Shower/Tub: Walk-in shower;Tub/shower unit Bathroom Toilet: Standard Bathroom Accessibility: Yes Home Adaptive Equipment: Straight cane Prior Function Level of Independence: Independent with basic ADLs;Independent with gait Driving: Yes Vocation: Retired Producer, television/film/video: Awake/alert Overall Cognitive Status: Appears within functional limits for tasks assessed Orientation Level: Appropriate for developmental age;Oriented X4 (memory impairment, forgetful at times) Cognition - Other Comments: impulsive Sensation/Coordination Sensation Light Touch: Appears Intact Proprioception: Appears Intact Coordination Gross Motor Movements are Fluid and Coordinated: Yes Extremity Assessment RLE Assessment RLE Assessment:  (ankle 2/5, decreased DF ROM, all other joints 3/5) LLE Assessment LLE Assessment: Within Functional Limits Mobility (including Balance) Bed Mobility Rolling Left: 4: Min assist Rolling Left Details (indicate cue type and reason): pt educated on log roll,  requires cues for technique and sequencing Right Sidelying to Sit: 4: Min assist Right Sidelying to Sit Details (indicate cue type and reason): cues for hand placement, technqiue, assist for wt shift Sitting - Scoot to Edge of Bed: 4: Min assist Sitting - Scoot to [place] of Bed Details (indicate cue type and reason): assist  at hips for wt shifts forward and laterally Transfers Transfers: Yes Sit to Stand: 4: Min assist Sit to Stand Details (indicate cue type and reason): cues for safety, hand placement Stand to Sit: 5: Supervision Stand to Sit Details: cues for safety and technique Stand Pivot Transfers: 4: Min assist Stand Pivot Transfer Details (indicate cue type and reason): with RW, cues for safety, assist for minor LOB due to pt turning quickly Ambulation/Gait Ambulation/Gait: Yes Ambulation/Gait Assistance: 4: Min assist Ambulation/Gait Assistance Details (indicate cue type and reason): with RW.  Pt requires cues for safety with RW, pt with decreased DF on R.  small step length, quick cadence, requires cues to slow down for safety Ambulation Distance (Feet): 100 Feet Assistive device: Rolling walker    Exercise    End of Session PT - End of Session Equipment Utilized During Treatment: Gait belt;Back brace Activity Tolerance: Patient tolerated treatment well Patient left: in chair;with call bell in reach;with family/visitor present Nurse Communication: Mobility status for transfers;Mobility status for ambulation General Behavior During Session:  (impulsive) Cognition: WFL for tasks performed (forgetful)  Annice Jolly 05/08/2011, 11:56 AM

## 2011-05-08 NOTE — Progress Notes (Signed)
Subjective: 1 Day Post-Op Procedure(s) (LRB): POSTERIOR LUMBAR FUSION 1 LEVEL (Right) Patient reports pain as mild.   Back pain no leg pain.  Sitting in chair.  No flatus , no nausea , ate breakfast.   Objective: Vital signs in last 24 hours: Temp:  [96.8 F (36 C)-98 F (36.7 C)] 97.3 F (36.3 C) (03/26 0628) Pulse Rate:  [65-86] 85  (03/26 0628) Resp:  [14-36] 18  (03/26 0800) BP: (106-135)/(70-90) 123/73 mmHg (03/26 0628) SpO2:  [98 %-100 %] 100 % (03/26 0800) Weight:  [76.749 kg (169 lb 3.2 oz)] 76.749 kg (169 lb 3.2 oz) (03/25 2320)  Intake/Output from previous day: 03/25 0701 - 03/26 0700 In: 3300 [I.V.:3200] Out: 1050 [Urine:900; Blood:150] Intake/Output this shift:     Basename 05/08/11 0517  HGB 9.9*    Basename 05/08/11 0517  WBC 8.3  RBC 3.10*  HCT 30.4*  PLT 145*    Basename 05/08/11 0517  NA 134*  K 3.7  CL 102  CO2 26  BUN 17  CREATININE 1.05  GLUCOSE 122*  CALCIUM 8.9   No results found for this basename: LABPT:2,INR:2 in the last 72 hours  Neurovascular intact Sensation intact distally Dorsiflexion/Plantar flexion intact Incision: dressing C/D/I Positive bowel sounds (per nurse evaluation) Assessment/Plan: 1 Day Post-Op Procedure(s) (LRB): POSTERIOR LUMBAR FUSION 1 LEVEL (Right) Advance diet Up with therapy Dc PCA and use po meds.   Dressing change apply mepilex If continues to do well may be able to dc to home tomorrow or next day.  ABL anemia  Pt asymptomatic, will monitor.  Tereka Thorley M 05/08/2011, 11:15 AM

## 2011-05-08 NOTE — Progress Notes (Signed)
UR COMPLETED  

## 2011-05-08 NOTE — Evaluation (Signed)
Occupational Therapy Evaluation Patient Details Name: Jonathan Berger MRN: [id number] DOB: April 11, 1931 Today's Date: 05/08/2011 [id number] - evII & 1sc  Problem List:  Patient Active Problem List  Diagnoses  . Herniated nucleus pulposus, L4-5 right    Past Medical History:  Past Medical History  Diagnosis Date  . Anemia   . Hypertension     takes Amlodipine/Valsartan/HCTZ daily  . Chronic back pain     herniated disc  . Hepatitis     50+yrs ago  . Personal history of kidney stones    Past Surgical History:  Past Surgical History  Procedure Date  . Lumbar laminectomy 01/29/2011    Procedure: MICRODISCECTOMY LUMBAR LAMINECTOMY;  Surgeon: Eldred Manges;  Location: [facility] OR;  Service: Orthopedics;  Laterality: Right;  Right L4-5 Microdiscectomy  . Back surgery 1977/006  . Colonoscopy   . Esophagogastroduodenoscopy   . Lithotripsy   . Cystoscopy     OT Assessment/Plan/Recommendation OT Assessment Clinical Impression Statement: Patient is a 78 yo white male s/p L4-5 fusion. Recommend acute OTto increase overall independence with basic selfcare tasks; goals set at an overall supervision level. Recommend AE (reacher, sock aid, long handled sponge, long handled shoe horn) for LB ADLs. Patient to D/C home with 24/7 supervision. OT Recommendation/Assessment: Patient will need skilled OT in the acute care venue OT Problem List: Decreased activity tolerance;Decreased cognition;Decreased safety awareness;Decreased knowledge of use of DME or AE;Decreased knowledge of precautions;Pain Barriers to Discharge: None OT Therapy Diagnosis : Generalized weakness;Acute pain OT Plan OT Frequency: Min 2X/week OT Treatment/Interventions: Self-care/ADL training;Therapeutic exercise;Energy conservation;DME and/or AE instruction;Therapeutic activities;Patient/family education;Balance training;Cognitive remediation/compensation OT Recommendation Follow Up Recommendations: No OT follow up Equipment  Recommended: Rolling walker with 5" wheels Individuals Consulted Consulted and Agree with Results and Recommendations: Patient;Family member/caregiver Family Member Consulted: Spouse OT Goals Acute Rehab OT Goals OT Goal Formulation: With patient Time For Goal Achievement: 7 days ADL Goals Pt Will Perform Lower Body Dressing: with supervision;with set-up;Sit to stand from chair;with adaptive equipment;with caregiver independent in assisting ADL Goal: Lower Body Dressing - Progress: Goal set today Pt Will Perform Tub/Shower Transfer: Shower transfer;with supervision;Ambulation;with DME ADL Goal: Tub/Shower Transfer - Progress: Goal set today Miscellaneous OT Goals Miscellaneous OT Goal #1: Patients spouse will donn lumbar brace independently OT Goal: Miscellaneous Goal #1 - Progress: Goal set today Miscellaneous OT Goal #2: Patient will recall 3/3 back precautions with supervision/min verbal cueing OT Goal: Miscellaneous Goal #2 - Progress: Goal set today  OT Evaluation Precautions/Restrictions  Precautions Precautions: Back Precaution Booklet Issued: Yes (comment) Required Braces or Orthoses: Yes Spinal Brace: Lumbar corset;Applied in sitting position Restrictions Weight Bearing Restrictions: No Prior Functioning Home Living Lives With: Spouse Receives Help From: Family Type of Home: House Home Layout: Two level Alternate Level Stairs-Rails: Right Alternate Level Stairs-Number of Steps: 10 Home Access: Stairs to enter Entrance Stairs-Rails: None Entrance Stairs-Number of Steps: 2-3 Bathroom Shower/Tub: Walk-in shower;Tub/shower unit Bathroom Toilet: Standard Bathroom Accessibility: Yes Home Adaptive Equipment: Straight cane Prior Function Level of Independence: Independent with basic ADLs;Independent with gait Driving: Yes Vocation: Retired ADL ADL Eating/Feeding: Simulated;Supervision/safety Eating/Feeding Details (indicate cue type and reason): secondary to  safety Where Assessed - Eating/Feeding: Chair Grooming: Simulated;Supervision/safety Where Assessed - Grooming: Standing at sink;Unsupported Upper Body Bathing: Simulated;Supervision/safety Where Assessed - Upper Body Bathing: Sitting, chair;Supported Lower Body Bathing: Simulated;Minimal assistance Where Assessed - Lower Body Bathing: Supported;Sit to stand from chair Upper Body Dressing: Simulated;Supervision/safety Upper Body Dressing Details (indicate cue type and reason): patient total assist to  donn lumbar brace Where Assessed - Upper Body Dressing: Sitting, bed Lower Body Dressing: Performed;Minimal assistance Lower Body Dressing Details (indicate cue type and reason): doffing/donning bilateral socks using reacher and sock aid; cueing for AE Where Assessed - Lower Body Dressing: Sitting, bed Toilet Transfer: Performed;Supervision/safety Toilet Transfer Details (indicate cue type and reason): Supervision/cueing for correct body mechanics for toilet transfer onto [facility] Toilet Transfer Method: Ambulating (using RW) Acupuncturist: Bedside commode Toileting - Clothing Manipulation: Simulated;Minimal assistance Toileting - Clothing Manipulation Details (indicate cue type and reason): cueing for NO BAT Where Assessed - Toileting Clothing Manipulation: Sit to stand from 3-in-1 or toilet;Standing Toileting - Hygiene: Simulated Where Assessed - Toileting Hygiene: Sit to stand from 3-in-1 or toilet;Standing Tub/Shower Transfer: Not assessed Equipment Used: Reacher;Sock aid Ambulation Related to ADLs: Min guard for functional ambulation using rolling walker. Patient required cueing for safety with walker and sit/stands. ADL Comments: After handout given and ~30min delay, patient unable to recall back precautions. Wife present during session and able to provide cueing/assistance prn. Introduced AE (reacher, sock aid, long handled sponge, and long handled shoe horn) to increase  independence with LB ADLs. Recommend AE for home use .  Vision/Perception  Vision - History Baseline Vision: No visual deficits Patient Visual Report: No change from baseline Vision - Assessment Vision Assessment: Vision not tested Perception Perception: Within Functional Limits Praxis Praxis: Intact Cognition Cognition Arousal/Alertness: Awake/alert Overall Cognitive Status: Impaired Memory: Appears impaired Memory Deficits: Patient reported "I can't remember stuff up here." Orientation Level: Oriented X4 Safety/Judgement: Decreased awareness of safety precautions Decreased Safety/Judgement: Decreased awareness of need for assistance Safety/Judgement - Other Comments: Patient unable to recall back precautions after handout given and education provided. Patient even commented on decreased memory. Cognition - Other Comments: impulsive Sensation/Coordination Sensation Light Touch: Appears Intact Stereognosis: Not tested Hot/Cold: Not tested Proprioception: Not tested Coordination Gross Motor Movements are Fluid and Coordinated: Yes Fine Motor Movements are Fluid and Coordinated: Yes Extremity Assessment RUE Assessment RUE Assessment: Within Functional Limits LUE Assessment LUE Assessment: Within Functional Limits Mobility  Bed Mobility Bed Mobility: Yes Rolling Right: 4: Min assist Rolling Right Details (indicate cue type and reason): Education on log rolling to prevent breaking back precautions Rolling Left: 4: Min assist Rolling Left Details (indicate cue type and reason): pt educated on log roll, requires cues for technique and sequencing Right Sidelying to Sit: 4: Min assist Right Sidelying to Sit Details (indicate cue type and reason): cues for hand placement, technqiue, assist for wt shift Supine to Sit: 5: Supervision Sitting - Scoot to Edge of Bed: 4: Min assist Sitting - Scoot to [place] of Bed Details (indicate cue type and reason): assist at hips for wt shifts  forward and laterally Transfers Transfers: Yes Sit to Stand: 4: Min assist;From bed;From chair/3-in-1 Sit to Stand Details (indicate cue type and reason): cues for safey and correct body mechanics Stand to Sit: 5: Supervision Stand to Sit Details: cues for safety and technique End of Session OT - End of Session Equipment Utilized During Treatment: Back brace Activity Tolerance: Patient limited by pain Patient left: with call bell in reach;in chair;with family/visitor present General Behavior During Session: Other (comment) (impulsive) Cognition: Impaired Cognitive Impairment: see cognition tab   Johnattan Strassman 05/08/2011, 12:17 PM

## 2011-05-08 NOTE — Progress Notes (Signed)
Pt unable to void after foley D/C at 0700 after multiple attempts. Bladder scan showed 315 cc of urine. I/O cath done at this time with a result of 550 cc of urine. Notified Dr Ophelia Charter and will continue to monitor output.

## 2011-05-08 NOTE — Anesthesia Postprocedure Evaluation (Signed)
  Anesthesia Post-op Note  Patient: Jonathan Berger  Procedure(s) Performed: Procedure(s) (LRB): POSTERIOR LUMBAR FUSION 1 LEVEL (Right)  Patient Location: PACU  Anesthesia Type: General  Level of Consciousness: awake, alert , oriented and patient cooperative  Airway and Oxygen Therapy: Patient Spontanous Breathing  Post-op Pain: none  Post-op Assessment: Post-op Vital signs reviewed, Respiratory Function Stable, Patent Airway, No signs of Nausea or vomiting, Adequate PO intake and Pain level controlled  Post-op Vital Signs: Reviewed and stable  Complications: No apparent anesthesia complications

## 2011-05-08 NOTE — Op Note (Signed)
NAME:  Jonathan Berger, Jonathan Berger NO.:  [id number]  MEDICAL RECORD NO.:  [id number]  LOCATION:  5036                         FACILITY:  [facility]  PHYSICIAN:  Bethany Hirt C. Ophelia Charter, M.D.    DATE OF BIRTH:  [birth year]  DATE OF PROCEDURE:  05/07/2011 DATE OF DISCHARGE:                              OPERATIVE REPORT   PREOPERATIVE DIAGNOSIS:  Fourth time recurrent right L4-5 herniated nucleus pulposus with radiculopathy, foraminal compression.  POSTOPERATIVE DIAGNOSIS:  Fourth time recurrent right L4-5 herniated nucleus pulposus with radiculopathy, foraminal compression.  PROCEDURE:  Right L4-5 TLIF 9-mm PEEK cage, local bone, bilateral lateral fusion, pedicle instrumentation 6.5 x 40 screws, 40-mm rods, retrolisthesis at L4 with instability and complete laminectomy, resection of abnormal facets (Gill procedure).  SURGEON:  Kayren Holck C. Ophelia Charter, MD  ASSISTANT:  Wende Neighbors, PA-C, medically necessary and present for the entire procedure.  ANESTHESIA:  CO2 plus Marcaine local.  EBL:  230 mL, Cell Saver re-transfusion pending.  DRAINS:  None.  COMPLICATIONS:  None.  HISTORY:  This is a 78 year old male who has had multiple disk recurrences on the right at the 4-5 level with recurrence of severe excruciating radiculopathy.  He had previous surgery done in mid- December 2012, did well for short period of time and then had severe recurrence and MRI scan showed a massive disk recurrent rupture in the lateral recess into the foramina and far lateral.  He had significant weakness following stumbling using walking aids and did not respond anti- inflammatories and epidural injections.  PROCEDURE:  After standard prepping and draping with the patient on the spine frame.  Head was elevated slightly download that face and eyes, yellow pads over the ulnar nerves, shoulders.  Back was prepped with DuraPrep after 10/10 drapes had been applied.  Sterile skin marker on the old incision and  Betadine Steri-Drape.  Laminectomy sheet and drape was applied.  Time-out procedure was completed.  Ancef 2 g was given. Old incision was opened from the previous recurrent microdiskectomy, extended slightly proximal distally, subperiosteal dissection on the lamina down on to the facets.  Identification of the transverse processes, which were extremely short at L4 and angled cephalad at L5. C-arm spot film was taken for confirmation of the appropriate level. The patient had mild scoliosis and C-arm was adjusted in order to do get endplates parallel to visualize the pedicles.  Lateral gutter was followed and at the level of the disk, there was mass amount of disk material present.  Bone was removed once the disk was identified laterally following the disk and there was tight spurs and multiple large chunks of disk that were 2 x 4 cm in size.  The removal of bone and disk fragments until exposures all the way out the lateral side. There was considerable spurs that were on the endplate 4-5, extended out laterally.  Disk was meticulously cleaned out and operative microscope was used and the lamina was removed performing a laminectomy.  Chunks of ligament were removed from the opposite side until pedicles were easily palpated visualized.  Foraminotomy was performed on both sides.  The patient had retrolisthesis at the 4-5 level, had instability and with large recurrent disk  herniation.  He was a good candidate for a fusion stabilization.  Disk space was meticulously cleaned out using angled ring curettes, straight ring curettes right and left, up and down and Epstein curettes, straight files, angled rasp, straight rasp.  Endplates were prepared and the bone that had been removed from the lamina and abnormal facets were used to pack into the disk space.  Blade was used to repair as well as 7 and 9 mm box and 9 mm trial under fluoroscopy showed excellent position and alignment filled the disk  space and reconstituted the collapse nicely and corrected the retrolisthesis. After bone chips had meticulously cleaned, the soft tissue was packed in the disk space, 9-mm cage had bone packed in the cage and then it was inserted, kicked over toward the midline.  Once it was transverse, spot pictures were taken, confirming this.  Cage was still slightly right of the midline; however, it was completely kicked over, was extremely tight and about 80% of the bone, which was slightly more than usual for one level.  TLIF had been packed in the disk space, filling it tightly.  After packing the bone, cage was inserted, checked under fluoroscopy as above and then pedicle screws were inserted, checking initially with all starter, pedicle Feeler, ball-tip probe, filling the hockey stick inside the canal around the pedicle, checking it under fluoroscopy with the joystick down, tapping again palpating with small ball pedicle Feeler with the tap and palpation inside the canal on the medial and inferior wall of the pedicle.  On top of the pedicle mixture, there was no penetration.  Ball-trip probe, decortication of the transverse process and then 6.5 x 40 screw insertion.  There was good position of the screws, all pictures.  The 40-mm rods were inserted, rod and caps were tightened down, compressed on the TLIF side first, then locking the screw cap on the final screw and then on the opposite left side, none TLIF side with the cage being inserted from the right.  Final spot pictures were taken, 1-2 mm of rod was present on each hand.  All screws were down the pedicle.  The patient tolerated the procedure well.  After irrigation copiously, standard layered closure with #1 Vicryl on the deep fascia, 2-0 on the subcutaneous tissue, 4-0 Vicryl subcuticular closure, postop dressing, Steri-Strips, Marcaine infiltration and then transferred to the recovery room.  Instrument count and needle count were  correct.     Mikala Podoll C. Ophelia Charter, M.D.     MCY/MEDQ  D:  05/07/2011  T:  05/08/2011  Job:  [id number]

## 2011-05-09 MED ORDER — METHOCARBAMOL 500 MG PO TABS: 500.0000 mg | ORAL_TABLET | Freq: Four times a day (QID) | ORAL | Status: AC | PRN

## 2011-05-09 MED ORDER — OXYCODONE-ACETAMINOPHEN 5-325 MG PO TABS: 1.0000 | ORAL_TABLET | ORAL | Status: AC | PRN

## 2011-05-09 NOTE — Progress Notes (Signed)
Subjective:  Voiding well and had BM.  Walked in hallway yesterday.  Has not done stair training.  Ready for discharge.  Pain controlled with percocet. Objective: Vital signs in last 24 hours: Temp:  [98.1 F (36.7 C)-98.5 F (36.9 C)] 98.3 F (36.8 C) (03/27 0629) Pulse Rate:  [86-106] 95  (03/27 0629) Resp:  [16-19] 18  (03/27 0629) BP: (93-121)/(62-73) 109/64 mmHg (03/27 0629) SpO2:  [97 %-100 %] 100 % (03/27 0629)  Intake/Output from previous day: 03/26 0701 - 03/27 0700 In: 2363.3 [P.O.:120; I.V.:2243.3] Out: 1500 [Urine:1500] Intake/Output this shift: Total I/O In: -  Out: 150 [Urine:150]   Basename 05/08/11 0517  HGB 9.9*    Basename 05/08/11 0517  WBC 8.3  RBC 3.10*  HCT 30.4*  PLT 145*    Basename 05/08/11 0517  NA 134*  K 3.7  CL 102  CO2 26  BUN 17  CREATININE 1.05  GLUCOSE 122*  CALCIUM 8.9   No results found for this basename: LABPT:2,INR:2 in the last 72 hours  ABD soft Neurovascular intact Sensation intact distally Intact pulses distally Dorsiflexion/Plantar flexion intact Incision: no drainage  Assessment/Plan: DC to home today after PT COD stable.  See DC summary for instructions to pt OV 1 week   Altovise Wahler M 05/09/2011, 8:50 AM

## 2011-05-09 NOTE — Progress Notes (Signed)
Pt discharged home with wife.  Pt and wife verbalizes understanding of all discharge instructions including incision care, back precautions, prescriptions and f/u appointment.

## 2011-05-09 NOTE — Progress Notes (Signed)
Physical Therapy Treatment Patient Details Name: Jonathan Berger MRN: [id number] DOB: [birth year] Today's Date: 05/09/2011  PT Assessment/Plan  PT - Assessment/Plan Comments on Treatment Session: Pt and wife performed treatment session safely with wife providing supervision assistance.  Pt is eager to return home.  Wife states she has no more questions for the PT and that she feels safe assisting patient with mobility at home. PT Plan: Discharge plan remains appropriate PT Frequency: Min 5X/week Follow Up Recommendations: Home health PT Equipment Recommended: Rolling walker with 5" wheels PT Goals  Acute Rehab PT Goals PT Goal: Supine/Side to Sit - Progress: Met PT Transfer Goal: Bed to Chair/Chair to Bed - Progress: Met PT Goal: Ambulate - Progress: Met PT Goal: Up/Down Stairs - Progress: Met  PT Treatment Precautions/Restrictions  Precautions Precautions: Back Precaution Booklet Issued: Yes (comment) Required Braces or Orthoses: Yes Spinal Brace: Thoracolumbosacral orthotic Restrictions Weight Bearing Restrictions: No Mobility (including Balance) Bed Mobility Rolling Right: 5: Supervision Rolling Right Details (indicate cue type and reason): wife instructing patient Right Sidelying to Sit: 5: Supervision Right Sidelying to Sit Details (indicate cue type and reason): wife providing cuing for technique Supine to Sit: 4: Min assist Supine to Sit Details (indicate cue type and reason): wife providing cuing for log roll and assist for LE Sitting - Scoot to Edge of Bed: 5: Supervision Transfers Sit to Stand: 5: Supervision Stand to Sit: 5: Supervision Stand to Sit Details: wife able to provide cues for safety and hand placement Ambulation/Gait Ambulation/Gait Assistance: 5: Supervision Ambulation/Gait Assistance Details (indicate cue type and reason): with RW with wife providing supervision.  pt requires cues for safety Ambulation Distance (Feet): 300 Feet Assistive device:  Rolling walker Gait velocity: normal Stairs: Yes Stairs Assistance: 5: Supervision Stairs Assistance Details (indicate cue type and reason): with 1 rail, step over step pattern Stair Management Technique: One rail Right Number of Stairs: 15     Exercise    End of Session PT - End of Session Equipment Utilized During Treatment: Gait belt;Back brace Activity Tolerance: Patient tolerated treatment well Patient left: in bed;with call bell in reach;with family/visitor present General Behavior During Session: [facility] for tasks performed (impulsive)  Zoeya Gramajo 05/09/2011, 10:26 AM

## 2011-05-14 MED FILL — Sodium Chloride IV Soln 0.9%: INTRAVENOUS | Qty: 1000 | Status: AC

## 2011-05-14 MED FILL — Heparin Sodium (Porcine) Inj 1000 Unit/ML: INTRAMUSCULAR | Qty: 30 | Status: AC

## 2011-05-14 NOTE — Discharge Summary (Signed)
Physician Discharge Summary  Patient ID: Jonathan Berger MRN: [id number] DOB/AGE: 10/27/1931 78 y.o.  Admit date: 05/07/2011 Discharge date: 05/14/2011  Admission Diagnoses:  Herniated nucleus pulposus, L4-5 right,   Recurrent 4th time, with severe foraminal and lateral recess stenosis  Discharge Diagnoses:  Principal Problem:  *Herniated nucleus pulposus, L4-5 right recurrent HNP 4th times with severe foraminal and lateral recess stenosis  Past Medical History  Diagnosis Date  . Anemia   . Hypertension     takes Amlodipine/Valsartan/HCTZ daily  . Chronic back pain     herniated disc  . Hepatitis     50+yrs ago  . Personal history of kidney stones     Surgeries: Procedure(s): POSTERIOR LUMBAR FUSION 1 LEVEL at L4-5 with pedicle screw and rods, cage and local bone graft on 05/07/2011   Consultants (if any):  none  Discharged Condition: Improved  Hospital Course: HAI GRABE is an 78 y.o. male who was admitted 05/07/2011 with a diagnosis of Herniated nucleus pulposus, L4-5 right and went to the operating room on 05/07/2011 and underwent the above named procedures.   Received PT and OT and was stable with ambulation prior to discharge. He was given perioperative antibiotics:  Anti-infectives     Start     Dose/Rate Route Frequency Ordered Stop   05/07/11 1800   ceFAZolin (ANCEF) IVPB 1 g/50 mL premix        1 g 100 mL/hr over 30 Minutes Intravenous Every 8 hours 05/07/11 1720 05/08/11 0144   05/07/11 1209   ceFAZolin (ANCEF) IVPB 2 g/50 mL premix        2 g 100 mL/hr over 30 Minutes Intravenous 60 min pre-op 05/07/11 1209 05/07/11 1305   05/06/11 1009   ceFAZolin (ANCEF) IVPB 1 g/50 mL premix  Status:  Discontinued        1 g 100 mL/hr over 30 Minutes Intravenous 60 min pre-op 05/06/11 1009 05/07/11 1209        .  He was given sequential compression devices, early ambulation.  He benefited maximally from the hospital stay and there were no complications.     Recent vital signs:  Filed Vitals:   05/09/11 0629  BP: 109/64  Pulse: 95  Temp: 98.3 F (36.8 C)  Resp: 18    Recent laboratory studies:  Lab Results  Component Value Date   HGB 9.9* 05/08/2011   HGB 12.2* 04/30/2011   HGB 16.1 01/26/2011   Lab Results  Component Value Date   WBC 8.3 05/08/2011   PLT 145* 05/08/2011   Lab Results  Component Value Date   INR 0.87 04/30/2011   Lab Results  Component Value Date   NA 134* 05/08/2011   K 3.7 05/08/2011   CL 102 05/08/2011   CO2 26 05/08/2011   BUN 17 05/08/2011   CREATININE 1.05 05/08/2011   GLUCOSE 122* 05/08/2011    Discharge Medications:   Medication List  As of 05/14/2011  1:00 PM   TAKE these medications         Amlodipine-Valsartan-HCTZ 10-160-12.5 MG Tabs   Take 0.5 tablets by mouth daily.      methocarbamol 500 MG tablet   Commonly known as: ROBAXIN   Take 1 tablet (500 mg total) by mouth every 6 (six) hours as needed.      oxyCODONE-acetaminophen 5-325 MG per tablet   Commonly known as: PERCOCET   Take 1-2 tablets by mouth every 4 (four) hours as needed.  Diagnostic Studies: Dg Lumbar Spine 2-3 Views  05/07/2011  *RADIOLOGY REPORT*  Clinical Data: Back pain  C-ARM GT 120 MIN,LUMBAR SPINE - 2-3 VIEW  Comparison: None.  Findings: C-arm images demonstrate L4-5 TLIF with bilateral pedicle screw fixation at L4-L5.  Satisfactory position and alignment. Disc space narrowing L5-S1.  IMPRESSION: L4-5 fusion.  Original Report Authenticated By: Elsie Stain, M.D.   Mr Lumbar Spine W Wo Contrast  04/17/2011  *RADIOLOGY REPORT*  Clinical Data: Low back pain with right hip pain and leg weakness for 5 months.  Status post surgery December, 2012.  MRI LUMBAR SPINE WITHOUT AND WITH CONTRAST  Technique:  Multiplanar and multiecho pulse sequences of the lumbar spine were obtained without and with intravenous contrast.  Contrast: 15mL MULTIHANCE GADOBENATE DIMEGLUMINE 529 MG/ML IV SOLN  Comparison: MRI lumbar spine  01/05/2011.  Findings: Again seen is straightening of the normal lumbar lordosis.  Vertebral body height is maintained.  Trace retrolisthesis of L4 on L5 is noted.  No worrisome marrow lesion is seen.  Visualized intra-abdominal contents demonstrate cystic change in both kidneys with a very large cyst on the left partially visualized.  Imaged intra-abdominal contents otherwise unremarkable.  T11-12:  Mild disc bulge without central canal or foraminal narrowing.  T12-L1:  Negative.  L1-2:  Mild disc bulge without central canal or foraminal narrowing.  L2-3:  Small extraforaminal protrusion on the left is identified. Central canal and foramina are open.  The appearance is unchanged.  L3-4:  Mild disc bulge without central canal or foraminal narrowing.  L4-5:  The patient is status post right laminotomy and partial facetectomy.  There is a large right lateral recess and foraminal protrusion encroaching on the right L4 and L5 roots.  The disc deforms the right aspect of the thecal sac.  Epidural enhancement is present of the surgical site and right L4 root.  L5-S1:  Mild disc bulge without central canal or foraminal narrowing.  IMPRESSION: Postoperative change on the right at L4-5 where a large disc protrusion encroaches on the descending right L5 and exiting right L4 roots.  Original Report Authenticated By: Bernadene Bell. Maricela Curet, M.D.   Dg C-arm Leonia Reeves 120 Min  05/07/2011  *RADIOLOGY REPORT*  Clinical Data: Back pain  C-ARM GT 120 MIN,LUMBAR SPINE - 2-3 VIEW  Comparison: None.  Findings: C-arm images demonstrate L4-5 TLIF with bilateral pedicle screw fixation at L4-L5.  Satisfactory position and alignment. Disc space narrowing L5-S1.  IMPRESSION: L4-5 fusion.  Original Report Authenticated By: Elsie Stain, M.D.    Disposition: 01-Home or Self Care  Discharge Orders    Future Orders Please Complete By Expires   Diet - low sodium heart healthy      Call MD / Call 911      Comments:   If you experience chest  pain or shortness of breath, CALL 911 and be transported to the hospital emergency room.  If you develope a fever above 101 F, pus (white drainage) or increased drainage or redness at the wound, or calf pain, call your surgeon's office.   Constipation Prevention      Comments:   Drink plenty of fluids.  Prune juice may be helpful.  You may use a stool softener, such as Colace (over the counter) 100 mg twice a day.  Use MiraLax (over the counter) for constipation as needed.   Increase activity slowly as tolerated      Weight Bearing as taught in Physical Therapy      Comments:  Use a walker or crutches as instructed.   Discharge instructions      Comments:   Change dressing daily or as needed.  Walk as tolerated.  Wear brace at all times when out of bed.  May shower Friday if no wound drainage.      Follow-up Information    Follow up with Eldred Manges, MD. Schedule an appointment as soon as possible for a visit in 1 week. (keep appt as scheduled)    Contact information:   [facility] [address] [place] [address] [phone]           Signed: Wende Neighbors 05/14/2011, 1:00 PM

## 2013-10-09 ENCOUNTER — Other Ambulatory Visit: Payer: Self-pay | Admitting: Internal Medicine

## 2013-10-09 ENCOUNTER — Ambulatory Visit
Admission: RE | Admit: 2013-10-09 | Discharge: 2013-10-09 | Disposition: A | Discharge status: Home / Self Care | Payer: Medicare Other | Source: Ambulatory Visit | Attending: Internal Medicine | Admitting: Internal Medicine

## 2013-10-09 DIAGNOSIS — E871 Hypo-osmolality and hyponatremia: Secondary | ICD-10-CM

## 2016-12-20 ENCOUNTER — Other Ambulatory Visit: Payer: Self-pay | Admitting: Internal Medicine

## 2016-12-20 DIAGNOSIS — R1310 Dysphagia, unspecified: Secondary | ICD-10-CM

## 2016-12-20 DIAGNOSIS — R413 Other amnesia: Secondary | ICD-10-CM

## 2016-12-24 ENCOUNTER — Ambulatory Visit
Admission: RE | Admit: 2016-12-24 | Discharge: 2016-12-24 | Disposition: A | Discharge status: Home / Self Care | Payer: Medicare Other | Source: Ambulatory Visit | Attending: Internal Medicine | Admitting: Internal Medicine

## 2016-12-24 DIAGNOSIS — R1310 Dysphagia, unspecified: Secondary | ICD-10-CM

## 2016-12-25 ENCOUNTER — Ambulatory Visit
Admission: RE | Admit: 2016-12-25 | Discharge: 2016-12-25 | Disposition: A | Discharge status: Home / Self Care | Payer: Medicare Other | Source: Ambulatory Visit | Attending: Internal Medicine | Admitting: Internal Medicine

## 2016-12-25 DIAGNOSIS — R413 Other amnesia: Secondary | ICD-10-CM

## 2019-03-22 ENCOUNTER — Inpatient Hospital Stay (HOSPITAL_COMMUNITY): Payer: Medicare Other

## 2019-03-22 ENCOUNTER — Inpatient Hospital Stay (HOSPITAL_COMMUNITY)
Admission: EM | Admit: 2019-03-22 | Discharge: 2019-05-14 | DRG: 034 | Disposition: E | Payer: Medicare Other | Attending: Internal Medicine | Admitting: Internal Medicine

## 2019-03-22 ENCOUNTER — Emergency Department (HOSPITAL_COMMUNITY): Payer: Medicare Other

## 2019-03-22 ENCOUNTER — Inpatient Hospital Stay (HOSPITAL_COMMUNITY): Payer: Medicare Other | Admitting: Registered Nurse

## 2019-03-22 ENCOUNTER — Encounter (HOSPITAL_COMMUNITY): Admission: EM | Disposition: E | Payer: Self-pay | Source: Home / Self Care | Attending: Internal Medicine

## 2019-03-22 ENCOUNTER — Encounter (HOSPITAL_COMMUNITY): Payer: Self-pay | Admitting: Radiology

## 2019-03-22 DIAGNOSIS — I63233 Cerebral infarction due to unspecified occlusion or stenosis of bilateral carotid arteries: Secondary | ICD-10-CM

## 2019-03-22 DIAGNOSIS — K117 Disturbances of salivary secretion: Secondary | ICD-10-CM

## 2019-03-22 DIAGNOSIS — A419 Sepsis, unspecified organism: Secondary | ICD-10-CM | POA: Diagnosis not present

## 2019-03-22 DIAGNOSIS — R000 Tachycardia, unspecified: Secondary | ICD-10-CM | POA: Diagnosis not present

## 2019-03-22 DIAGNOSIS — E7800 Pure hypercholesterolemia, unspecified: Secondary | ICD-10-CM | POA: Diagnosis not present

## 2019-03-22 DIAGNOSIS — J95821 Acute postprocedural respiratory failure: Secondary | ICD-10-CM | POA: Diagnosis not present

## 2019-03-22 DIAGNOSIS — E46 Unspecified protein-calorie malnutrition: Secondary | ICD-10-CM | POA: Diagnosis not present

## 2019-03-22 DIAGNOSIS — Z9911 Dependence on respirator [ventilator] status: Secondary | ICD-10-CM

## 2019-03-22 DIAGNOSIS — I5041 Acute combined systolic (congestive) and diastolic (congestive) heart failure: Secondary | ICD-10-CM | POA: Diagnosis not present

## 2019-03-22 DIAGNOSIS — I472 Ventricular tachycardia: Secondary | ICD-10-CM | POA: Diagnosis not present

## 2019-03-22 DIAGNOSIS — I6601 Occlusion and stenosis of right middle cerebral artery: Secondary | ICD-10-CM

## 2019-03-22 DIAGNOSIS — R29706 NIHSS score 6: Secondary | ICD-10-CM | POA: Diagnosis present

## 2019-03-22 DIAGNOSIS — I639 Cerebral infarction, unspecified: Secondary | ICD-10-CM | POA: Diagnosis not present

## 2019-03-22 DIAGNOSIS — I491 Atrial premature depolarization: Secondary | ICD-10-CM | POA: Diagnosis not present

## 2019-03-22 DIAGNOSIS — Z7189 Other specified counseling: Secondary | ICD-10-CM | POA: Diagnosis not present

## 2019-03-22 DIAGNOSIS — K5900 Constipation, unspecified: Secondary | ICD-10-CM | POA: Diagnosis not present

## 2019-03-22 DIAGNOSIS — R1319 Other dysphagia: Secondary | ICD-10-CM | POA: Diagnosis not present

## 2019-03-22 DIAGNOSIS — I9589 Other hypotension: Secondary | ICD-10-CM | POA: Diagnosis not present

## 2019-03-22 DIAGNOSIS — I519 Heart disease, unspecified: Secondary | ICD-10-CM | POA: Diagnosis not present

## 2019-03-22 DIAGNOSIS — I429 Cardiomyopathy, unspecified: Secondary | ICD-10-CM | POA: Diagnosis not present

## 2019-03-22 DIAGNOSIS — G301 Alzheimer's disease with late onset: Secondary | ICD-10-CM | POA: Diagnosis not present

## 2019-03-22 DIAGNOSIS — N183 Chronic kidney disease, stage 3 unspecified: Secondary | ICD-10-CM | POA: Diagnosis present

## 2019-03-22 DIAGNOSIS — M6281 Muscle weakness (generalized): Secondary | ICD-10-CM

## 2019-03-22 DIAGNOSIS — F05 Delirium due to known physiological condition: Secondary | ICD-10-CM | POA: Diagnosis present

## 2019-03-22 DIAGNOSIS — R1312 Dysphagia, oropharyngeal phase: Secondary | ICD-10-CM | POA: Diagnosis present

## 2019-03-22 DIAGNOSIS — Z9189 Other specified personal risk factors, not elsewhere classified: Secondary | ICD-10-CM

## 2019-03-22 DIAGNOSIS — E860 Dehydration: Secondary | ICD-10-CM | POA: Diagnosis present

## 2019-03-22 DIAGNOSIS — N179 Acute kidney failure, unspecified: Secondary | ICD-10-CM | POA: Diagnosis not present

## 2019-03-22 DIAGNOSIS — I4719 Other supraventricular tachycardia: Secondary | ICD-10-CM

## 2019-03-22 DIAGNOSIS — E876 Hypokalemia: Secondary | ICD-10-CM | POA: Diagnosis not present

## 2019-03-22 DIAGNOSIS — R1310 Dysphagia, unspecified: Secondary | ICD-10-CM | POA: Diagnosis not present

## 2019-03-22 DIAGNOSIS — E870 Hyperosmolality and hypernatremia: Secondary | ICD-10-CM | POA: Diagnosis not present

## 2019-03-22 DIAGNOSIS — R627 Adult failure to thrive: Secondary | ICD-10-CM

## 2019-03-22 DIAGNOSIS — Z66 Do not resuscitate: Secondary | ICD-10-CM

## 2019-03-22 DIAGNOSIS — R414 Neurologic neglect syndrome: Secondary | ICD-10-CM | POA: Diagnosis present

## 2019-03-22 DIAGNOSIS — I480 Paroxysmal atrial fibrillation: Secondary | ICD-10-CM

## 2019-03-22 DIAGNOSIS — Z515 Encounter for palliative care: Secondary | ICD-10-CM | POA: Diagnosis not present

## 2019-03-22 DIAGNOSIS — I63411 Cerebral infarction due to embolism of right middle cerebral artery: Secondary | ICD-10-CM | POA: Diagnosis present

## 2019-03-22 DIAGNOSIS — J40 Bronchitis, not specified as acute or chronic: Secondary | ICD-10-CM | POA: Diagnosis not present

## 2019-03-22 DIAGNOSIS — I9581 Postprocedural hypotension: Secondary | ICD-10-CM | POA: Diagnosis not present

## 2019-03-22 DIAGNOSIS — Z978 Presence of other specified devices: Secondary | ICD-10-CM | POA: Diagnosis not present

## 2019-03-22 DIAGNOSIS — I471 Supraventricular tachycardia: Secondary | ICD-10-CM

## 2019-03-22 DIAGNOSIS — R0902 Hypoxemia: Secondary | ICD-10-CM

## 2019-03-22 DIAGNOSIS — R410 Disorientation, unspecified: Secondary | ICD-10-CM | POA: Diagnosis not present

## 2019-03-22 DIAGNOSIS — M549 Dorsalgia, unspecified: Secondary | ICD-10-CM | POA: Diagnosis present

## 2019-03-22 DIAGNOSIS — G8929 Other chronic pain: Secondary | ICD-10-CM | POA: Diagnosis present

## 2019-03-22 DIAGNOSIS — R4189 Other symptoms and signs involving cognitive functions and awareness: Secondary | ICD-10-CM | POA: Diagnosis not present

## 2019-03-22 DIAGNOSIS — J690 Pneumonitis due to inhalation of food and vomit: Secondary | ICD-10-CM | POA: Diagnosis not present

## 2019-03-22 DIAGNOSIS — D696 Thrombocytopenia, unspecified: Secondary | ICD-10-CM | POA: Diagnosis not present

## 2019-03-22 DIAGNOSIS — G8194 Hemiplegia, unspecified affecting left nondominant side: Secondary | ICD-10-CM | POA: Diagnosis present

## 2019-03-22 DIAGNOSIS — G9340 Encephalopathy, unspecified: Secondary | ICD-10-CM | POA: Diagnosis not present

## 2019-03-22 DIAGNOSIS — R0603 Acute respiratory distress: Secondary | ICD-10-CM

## 2019-03-22 DIAGNOSIS — R451 Restlessness and agitation: Secondary | ICD-10-CM

## 2019-03-22 DIAGNOSIS — I10 Essential (primary) hypertension: Secondary | ICD-10-CM | POA: Diagnosis not present

## 2019-03-22 DIAGNOSIS — E878 Other disorders of electrolyte and fluid balance, not elsewhere classified: Secondary | ICD-10-CM | POA: Diagnosis not present

## 2019-03-22 DIAGNOSIS — F0280 Dementia in other diseases classified elsewhere without behavioral disturbance: Secondary | ICD-10-CM | POA: Diagnosis not present

## 2019-03-22 DIAGNOSIS — I130 Hypertensive heart and chronic kidney disease with heart failure and stage 1 through stage 4 chronic kidney disease, or unspecified chronic kidney disease: Secondary | ICD-10-CM | POA: Diagnosis present

## 2019-03-22 DIAGNOSIS — R278 Other lack of coordination: Secondary | ICD-10-CM | POA: Diagnosis present

## 2019-03-22 DIAGNOSIS — Z20822 Contact with and (suspected) exposure to covid-19: Secondary | ICD-10-CM | POA: Diagnosis present

## 2019-03-22 DIAGNOSIS — R29810 Facial weakness: Secondary | ICD-10-CM | POA: Diagnosis present

## 2019-03-22 DIAGNOSIS — R471 Dysarthria and anarthria: Secondary | ICD-10-CM | POA: Diagnosis present

## 2019-03-22 DIAGNOSIS — Z781 Physical restraint status: Secondary | ICD-10-CM

## 2019-03-22 DIAGNOSIS — Z79899 Other long term (current) drug therapy: Secondary | ICD-10-CM

## 2019-03-22 DIAGNOSIS — Z87891 Personal history of nicotine dependence: Secondary | ICD-10-CM

## 2019-03-22 DIAGNOSIS — J9690 Respiratory failure, unspecified, unspecified whether with hypoxia or hypercapnia: Secondary | ICD-10-CM

## 2019-03-22 DIAGNOSIS — D649 Anemia, unspecified: Secondary | ICD-10-CM | POA: Diagnosis not present

## 2019-03-22 DIAGNOSIS — I6523 Occlusion and stenosis of bilateral carotid arteries: Secondary | ICD-10-CM | POA: Diagnosis present

## 2019-03-22 DIAGNOSIS — R197 Diarrhea, unspecified: Secondary | ICD-10-CM | POA: Diagnosis not present

## 2019-03-22 DIAGNOSIS — Z6826 Body mass index (BMI) 26.0-26.9, adult: Secondary | ICD-10-CM

## 2019-03-22 DIAGNOSIS — R4702 Dysphasia: Secondary | ICD-10-CM | POA: Diagnosis present

## 2019-03-22 DIAGNOSIS — R4182 Altered mental status, unspecified: Secondary | ICD-10-CM | POA: Diagnosis not present

## 2019-03-22 DIAGNOSIS — J9601 Acute respiratory failure with hypoxia: Secondary | ICD-10-CM | POA: Diagnosis not present

## 2019-03-22 DIAGNOSIS — F0390 Unspecified dementia without behavioral disturbance: Secondary | ICD-10-CM | POA: Diagnosis present

## 2019-03-22 DIAGNOSIS — I493 Ventricular premature depolarization: Secondary | ICD-10-CM | POA: Diagnosis not present

## 2019-03-22 DIAGNOSIS — R062 Wheezing: Secondary | ICD-10-CM

## 2019-03-22 DIAGNOSIS — Z4659 Encounter for fitting and adjustment of other gastrointestinal appliance and device: Secondary | ICD-10-CM

## 2019-03-22 DIAGNOSIS — R339 Retention of urine, unspecified: Secondary | ICD-10-CM | POA: Diagnosis not present

## 2019-03-22 DIAGNOSIS — J9621 Acute and chronic respiratory failure with hypoxia: Secondary | ICD-10-CM | POA: Diagnosis present

## 2019-03-22 DIAGNOSIS — Z7289 Other problems related to lifestyle: Secondary | ICD-10-CM

## 2019-03-22 DIAGNOSIS — J9600 Acute respiratory failure, unspecified whether with hypoxia or hypercapnia: Secondary | ICD-10-CM | POA: Diagnosis not present

## 2019-03-22 DIAGNOSIS — R0602 Shortness of breath: Secondary | ICD-10-CM

## 2019-03-22 DIAGNOSIS — E785 Hyperlipidemia, unspecified: Secondary | ICD-10-CM | POA: Diagnosis present

## 2019-03-22 DIAGNOSIS — I952 Hypotension due to drugs: Secondary | ICD-10-CM | POA: Diagnosis not present

## 2019-03-22 HISTORY — PX: IR INTRAVSC STENT CERV CAROTID W/O EMB-PROT MOD SED INC ANGIO: IMG2304

## 2019-03-22 HISTORY — PX: IR CT HEAD LTD: IMG2386

## 2019-03-22 HISTORY — PX: RADIOLOGY WITH ANESTHESIA: SHX6223

## 2019-03-22 HISTORY — PX: IR ANGIO VERTEBRAL SEL SUBCLAVIAN INNOMINATE UNI L MOD SED: IMG5364

## 2019-03-22 HISTORY — PX: IR ENDOVASC INTRACRANIAL INF OTHER THAN THROMBO ART INC DIAG ANGIO: IMG6088

## 2019-03-22 HISTORY — PX: IR ANGIO INTRA EXTRACRAN SEL COM CAROTID INNOMINATE UNI L MOD SED: IMG5358

## 2019-03-22 LAB — I-STAT CHEM 8, ED
BUN: 24 mg/dL — ABNORMAL HIGH (ref 8–23)
Calcium, Ion: 1.27 mmol/L (ref 1.15–1.40)
Chloride: 102 mmol/L (ref 98–111)
Creatinine, Ser: 1.3 mg/dL — ABNORMAL HIGH (ref 0.61–1.24)
Glucose, Bld: 155 mg/dL — ABNORMAL HIGH (ref 70–99)
HCT: 47 % (ref 39.0–52.0)
Hemoglobin: 16 g/dL (ref 13.0–17.0)
Potassium: 3.6 mmol/L (ref 3.5–5.1)
Sodium: 137 mmol/L (ref 135–145)
TCO2: 28 mmol/L (ref 22–32)

## 2019-03-22 LAB — DIFFERENTIAL
Abs Immature Granulocytes: 0.02 10*3/uL (ref 0.00–0.07)
Basophils Absolute: 0 10*3/uL (ref 0.0–0.1)
Basophils Relative: 0 %
Eosinophils Absolute: 0.2 10*3/uL (ref 0.0–0.5)
Eosinophils Relative: 2 %
Immature Granulocytes: 0 %
Lymphocytes Relative: 37 %
Lymphs Abs: 3.6 10*3/uL (ref 0.7–4.0)
Monocytes Absolute: 0.8 10*3/uL (ref 0.1–1.0)
Monocytes Relative: 8 %
Neutro Abs: 5 10*3/uL (ref 1.7–7.7)
Neutrophils Relative %: 53 %

## 2019-03-22 LAB — COMPREHENSIVE METABOLIC PANEL
ALT: 15 U/L (ref 0–44)
AST: 19 U/L (ref 15–41)
Albumin: 3.5 g/dL (ref 3.5–5.0)
Alkaline Phosphatase: 54 U/L (ref 38–126)
Anion gap: 9 (ref 5–15)
BUN: 22 mg/dL (ref 8–23)
CO2: 25 mmol/L (ref 22–32)
Calcium: 10.3 mg/dL (ref 8.9–10.3)
Chloride: 102 mmol/L (ref 98–111)
Creatinine, Ser: 1.36 mg/dL — ABNORMAL HIGH (ref 0.61–1.24)
GFR calc Af Amer: 54 mL/min — ABNORMAL LOW (ref >60)
GFR calc non Af Amer: 47 mL/min — ABNORMAL LOW (ref >60)
Glucose, Bld: 160 mg/dL — ABNORMAL HIGH (ref 70–99)
Potassium: 3.6 mmol/L (ref 3.5–5.1)
Sodium: 136 mmol/L (ref 135–145)
Total Bilirubin: 0.8 mg/dL (ref 0.3–1.2)
Total Protein: 6.6 g/dL (ref 6.5–8.1)

## 2019-03-22 LAB — CBC
HCT: 48 % (ref 39.0–52.0)
Hemoglobin: 16.1 g/dL (ref 13.0–17.0)
MCH: 32.8 pg (ref 26.0–34.0)
MCHC: 33.5 g/dL (ref 30.0–36.0)
MCV: 97.8 fL (ref 80.0–100.0)
Platelets: 145 10*3/uL — ABNORMAL LOW (ref 150–400)
RBC: 4.91 MIL/uL (ref 4.22–5.81)
RDW: 12.9 % (ref 11.5–15.5)
WBC: 9.5 10*3/uL (ref 4.0–10.5)
nRBC: 0 % (ref 0.0–0.2)

## 2019-03-22 LAB — RESPIRATORY PANEL BY RT PCR (FLU A&B, COVID)
Influenza A by PCR: NEGATIVE
Influenza B by PCR: NEGATIVE
SARS Coronavirus 2 by RT PCR: NEGATIVE

## 2019-03-22 LAB — GLUCOSE, CAPILLARY: Glucose-Capillary: 128 mg/dL — ABNORMAL HIGH (ref 70–99)

## 2019-03-22 LAB — CBG MONITORING, ED
Glucose-Capillary: 151 mg/dL — ABNORMAL HIGH (ref 70–99)
Glucose-Capillary: 149 mg/dL — ABNORMAL HIGH (ref 70–99)

## 2019-03-22 LAB — APTT: aPTT: 28 seconds (ref 24–36)

## 2019-03-22 LAB — PROTIME-INR
INR: 0.9 (ref 0.8–1.2)
Prothrombin Time: 12.3 seconds (ref 11.4–15.2)

## 2019-03-22 SURGERY — RADIOLOGY WITH ANESTHESIA
Anesthesia: General

## 2019-03-22 MED ORDER — ASPIRIN 81 MG PO CHEW
81.0000 mg | CHEWABLE_TABLET | Freq: Every day | ORAL | Status: DC
  Administered 2019-03-23 – 2019-03-24 (×2): 81 mg
  Filled 2019-03-22 (×2): qty 1

## 2019-03-22 MED ORDER — ACETAMINOPHEN 650 MG RE SUPP
650.0000 mg | RECTAL | Status: DC | PRN
  Administered 2019-04-16 – 2019-04-17 (×2): 650 mg via RECTAL
  Filled 2019-03-22 (×2): qty 1

## 2019-03-22 MED ORDER — PROPOFOL 500 MG/50ML IV EMUL
INTRAVENOUS | Status: DC | PRN
  Administered 2019-03-22: 50 ug/kg/min via INTRAVENOUS

## 2019-03-22 MED ORDER — ORAL CARE MOUTH RINSE
15.0000 mL | OROMUCOSAL | Status: DC
  Administered 2019-03-22 – 2019-03-26 (×38): 15 mL via OROMUCOSAL

## 2019-03-22 MED ORDER — DONEPEZIL HCL 10 MG PO TABS
10.0000 mg | ORAL_TABLET | Freq: Every day | ORAL | Status: DC
  Administered 2019-03-22: 10 mg

## 2019-03-22 MED ORDER — ALTEPLASE (STROKE) FULL DOSE INFUSION
0.9000 mg/kg | Freq: Once | INTRAVENOUS | Status: AC
  Administered 2019-03-22: 77.2 mg via INTRAVENOUS
  Filled 2019-03-22: qty 100

## 2019-03-22 MED ORDER — EPTIFIBATIDE 20 MG/10ML IV SOLN
INTRAVENOUS | Status: AC
  Administered 2019-03-22: 4.5 mg via INTRA_ARTERIAL
  Administered 2019-03-22: 3 mg
  Filled 2019-03-22: qty 10

## 2019-03-22 MED ORDER — ACETAMINOPHEN 650 MG RE SUPP: 650.0000 mg | RECTAL | Status: DC | PRN

## 2019-03-22 MED ORDER — NITROGLYCERIN 1 MG/10 ML FOR IR/CATH LAB
INTRA_ARTERIAL | Status: AC
  Filled 2019-03-22: qty 10

## 2019-03-22 MED ORDER — CLOPIDOGREL BISULFATE 300 MG PO TABS
ORAL_TABLET | ORAL | Status: AC
  Filled 2019-03-22: qty 1

## 2019-03-22 MED ORDER — CLEVIDIPINE BUTYRATE 0.5 MG/ML IV EMUL
0.0000 mg/h | INTRAVENOUS | Status: DC
  Filled 2019-03-22: qty 50

## 2019-03-22 MED ORDER — LIDOCAINE 2% (20 MG/ML) 5 ML SYRINGE
INTRAMUSCULAR | Status: DC | PRN
  Administered 2019-03-22: 90 mg via INTRAVENOUS

## 2019-03-22 MED ORDER — DONEPEZIL HCL 10 MG PO TABS: 10.0000 mg | ORAL_TABLET | Freq: Every day | ORAL | Status: DC

## 2019-03-22 MED ORDER — CEFAZOLIN SODIUM-DEXTROSE 2-4 GM/100ML-% IV SOLN
INTRAVENOUS | Status: AC
  Filled 2019-03-22: qty 100

## 2019-03-22 MED ORDER — IRBESARTAN 150 MG PO TABS: 75.0000 mg | ORAL_TABLET | Freq: Every day | ORAL | Status: DC

## 2019-03-22 MED ORDER — TIROFIBAN HCL IV 12.5 MG/250 ML
0.0750 ug/kg/min | INTRAVENOUS | Status: DC
  Administered 2019-03-22: 0.075 ug/kg/min via INTRAVENOUS
  Filled 2019-03-22: qty 250

## 2019-03-22 MED ORDER — FENTANYL CITRATE (PF) 250 MCG/5ML IJ SOLN
INTRAMUSCULAR | Status: DC | PRN
  Administered 2019-03-22 (×2): 25 ug via INTRAVENOUS
  Administered 2019-03-22: 50 ug via INTRAVENOUS
  Administered 2019-03-22 (×2): 25 ug via INTRAVENOUS
  Administered 2019-03-22: 50 ug via INTRAVENOUS

## 2019-03-22 MED ORDER — AMLODIPINE BESYLATE 5 MG PO TABS: 5.0000 mg | ORAL_TABLET | Freq: Every day | ORAL | Status: DC

## 2019-03-22 MED ORDER — HEPARIN SODIUM (PORCINE) 1000 UNIT/ML IJ SOLN
INTRAMUSCULAR | Status: DC | PRN
  Administered 2019-03-22: 1000 [IU] via INTRAVENOUS

## 2019-03-22 MED ORDER — IOHEXOL 350 MG/ML SOLN
50.0000 mL | Freq: Once | INTRAVENOUS | Status: AC | PRN
  Administered 2019-03-22: 50 mL via INTRAVENOUS

## 2019-03-22 MED ORDER — SENNOSIDES-DOCUSATE SODIUM 8.6-50 MG PO TABS: 1.0000 | ORAL_TABLET | Freq: Every evening | ORAL | Status: DC | PRN

## 2019-03-22 MED ORDER — SODIUM CHLORIDE 0.9 % IV SOLN: 50.0000 mL | Freq: Once | INTRAVENOUS | Status: DC

## 2019-03-22 MED ORDER — ROCURONIUM BROMIDE 10 MG/ML (PF) SYRINGE
PREFILLED_SYRINGE | INTRAVENOUS | Status: DC | PRN
  Administered 2019-03-22 (×2): 20 mg via INTRAVENOUS
  Administered 2019-03-22: 50 mg via INTRAVENOUS
  Administered 2019-03-22: 20 mg via INTRAVENOUS

## 2019-03-22 MED ORDER — FENTANYL BOLUS VIA INFUSION
25.0000 ug | INTRAVENOUS | Status: DC | PRN
  Administered 2019-03-22: 25 ug via INTRAVENOUS
  Filled 2019-03-22: qty 25

## 2019-03-22 MED ORDER — PANTOPRAZOLE SODIUM 40 MG IV SOLR
40.0000 mg | Freq: Every day | INTRAVENOUS | Status: DC
  Administered 2019-03-22 – 2019-03-26 (×5): 40 mg via INTRAVENOUS
  Filled 2019-03-22 (×4): qty 40

## 2019-03-22 MED ORDER — TICAGRELOR 90 MG PO TABS
ORAL_TABLET | ORAL | Status: AC
  Administered 2019-03-22: 180 mg
  Filled 2019-03-22: qty 2

## 2019-03-22 MED ORDER — FENTANYL CITRATE (PF) 100 MCG/2ML IJ SOLN
25.0000 ug | Freq: Once | INTRAMUSCULAR | Status: AC
  Administered 2019-03-22: 19:00:00 25 ug via INTRAVENOUS

## 2019-03-22 MED ORDER — PHENYLEPHRINE HCL-NACL 10-0.9 MG/250ML-% IV SOLN
0.0000 ug/min | INTRAVENOUS | Status: DC
  Administered 2019-03-22: 55 ug/min via INTRAVENOUS

## 2019-03-22 MED ORDER — ACETAMINOPHEN 325 MG PO TABS: 650.0000 mg | ORAL_TABLET | ORAL | Status: DC | PRN

## 2019-03-22 MED ORDER — PHENYLEPHRINE HCL-NACL 10-0.9 MG/250ML-% IV SOLN
INTRAVENOUS | Status: DC | PRN
  Administered 2019-03-22: 35 ug/min via INTRAVENOUS

## 2019-03-22 MED ORDER — TICAGRELOR 90 MG PO TABS
90.0000 mg | ORAL_TABLET | Freq: Two times a day (BID) | ORAL | Status: DC
  Administered 2019-03-22 – 2019-04-17 (×47): 90 mg
  Filled 2019-03-22 (×44): qty 1

## 2019-03-22 MED ORDER — EPHEDRINE SULFATE 50 MG/ML IJ SOLN
INTRAMUSCULAR | Status: DC | PRN
  Administered 2019-03-22: 10 mg via INTRAVENOUS

## 2019-03-22 MED ORDER — SODIUM CHLORIDE 0.9 % IV SOLN: 250.0000 mL | INTRAVENOUS | Status: DC

## 2019-03-22 MED ORDER — HYDROCHLOROTHIAZIDE 12.5 MG PO CAPS: 12.5000 mg | ORAL_CAPSULE | Freq: Every day | ORAL | Status: DC

## 2019-03-22 MED ORDER — ASPIRIN 81 MG PO CHEW: 81.0000 mg | CHEWABLE_TABLET | Freq: Every day | ORAL | Status: DC

## 2019-03-22 MED ORDER — TIROFIBAN (AGGRASTAT) BOLUS VIA INFUSION
25.0000 ug/kg | Freq: Once | INTRAVENOUS | Status: AC
  Administered 2019-03-22: 2145 ug via INTRAVENOUS
  Filled 2019-03-22: qty 43

## 2019-03-22 MED ORDER — ONDANSETRON HCL 4 MG/2ML IJ SOLN
INTRAMUSCULAR | Status: DC | PRN
  Administered 2019-03-22: 4 mg via INTRAVENOUS

## 2019-03-22 MED ORDER — ACETAMINOPHEN 160 MG/5ML PO SOLN: 650.0000 mg | ORAL | Status: DC | PRN

## 2019-03-22 MED ORDER — PROPOFOL 10 MG/ML IV BOLUS
INTRAVENOUS | Status: DC | PRN
  Administered 2019-03-22: 20 mg via INTRAVENOUS
  Administered 2019-03-22: 90 mg via INTRAVENOUS

## 2019-03-22 MED ORDER — ASPIRIN 81 MG PO CHEW
CHEWABLE_TABLET | ORAL | Status: AC
  Administered 2019-03-22: 81 mg
  Filled 2019-03-22: qty 1

## 2019-03-22 MED ORDER — GLYCOPYRROLATE 0.2 MG/ML IJ SOLN
INTRAMUSCULAR | Status: DC | PRN
  Administered 2019-03-22: .2 mg via INTRAVENOUS

## 2019-03-22 MED ORDER — IOHEXOL 300 MG/ML  SOLN
150.0000 mL | Freq: Once | INTRAMUSCULAR | Status: AC | PRN
  Administered 2019-03-22: 70 mL via INTRA_ARTERIAL

## 2019-03-22 MED ORDER — CLEVIDIPINE BUTYRATE 0.5 MG/ML IV EMUL: 0.0000 mg/h | INTRAVENOUS | Status: DC

## 2019-03-22 MED ORDER — DONEPEZIL HCL 10 MG PO TABS
10.0000 mg | ORAL_TABLET | Freq: Every day | ORAL | Status: DC
  Filled 2019-03-22: qty 1

## 2019-03-22 MED ORDER — ACETAMINOPHEN 325 MG PO TABS
650.0000 mg | ORAL_TABLET | ORAL | Status: DC | PRN
  Administered 2019-04-05 – 2019-04-08 (×2): 650 mg via ORAL
  Filled 2019-03-22 (×2): qty 2

## 2019-03-22 MED ORDER — ACETAMINOPHEN 160 MG/5ML PO SOLN
650.0000 mg | ORAL | Status: DC | PRN
  Administered 2019-03-24 – 2019-04-06 (×6): 650 mg
  Filled 2019-03-22 (×8): qty 20.3

## 2019-03-22 MED ORDER — LACTATED RINGERS IV SOLN: INTRAVENOUS | Status: DC | PRN

## 2019-03-22 MED ORDER — PHENYLEPHRINE HCL-NACL 10-0.9 MG/250ML-% IV SOLN
INTRAVENOUS | Status: AC
  Filled 2019-03-22: qty 250

## 2019-03-22 MED ORDER — AMLODIPINE-VALSARTAN-HCTZ 10-160-12.5 MG PO TABS: 0.5000 | ORAL_TABLET | Freq: Every day | ORAL | Status: DC

## 2019-03-22 MED ORDER — CEFAZOLIN SODIUM-DEXTROSE 2-3 GM-%(50ML) IV SOLR
INTRAVENOUS | Status: DC | PRN
  Administered 2019-03-22: 2 g via INTRAVENOUS

## 2019-03-22 MED ORDER — PROPOFOL 1000 MG/100ML IV EMUL
0.0000 ug/kg/min | INTRAVENOUS | Status: DC
  Administered 2019-03-22: 50 ug/kg/min via INTRAVENOUS
  Administered 2019-03-22 – 2019-03-23 (×2): 25 ug/kg/min via INTRAVENOUS
  Administered 2019-03-23: 03:00:00 30 ug/kg/min via INTRAVENOUS
  Administered 2019-03-24: 05:00:00 20 ug/kg/min via INTRAVENOUS
  Filled 2019-03-22 (×4): qty 100

## 2019-03-22 MED ORDER — TIROFIBAN HCL IN NACL 5-0.9 MG/100ML-% IV SOLN
INTRAVENOUS | Status: AC
  Filled 2019-03-22: qty 100

## 2019-03-22 MED ORDER — SUCCINYLCHOLINE CHLORIDE 200 MG/10ML IV SOSY
PREFILLED_SYRINGE | INTRAVENOUS | Status: DC | PRN
  Administered 2019-03-22: 130 mg via INTRAVENOUS

## 2019-03-22 MED ORDER — FENTANYL 2500MCG IN NS 250ML (10MCG/ML) PREMIX INFUSION
25.0000 ug/h | INTRAVENOUS | Status: DC
  Administered 2019-03-22: 50 ug/h via INTRAVENOUS
  Administered 2019-03-23: 200 ug/h via INTRAVENOUS
  Filled 2019-03-22 (×2): qty 250

## 2019-03-22 MED ORDER — STROKE: EARLY STAGES OF RECOVERY BOOK
Freq: Once | Status: AC
  Filled 2019-03-22: qty 1

## 2019-03-22 MED ORDER — TICAGRELOR 90 MG PO TABS
90.0000 mg | ORAL_TABLET | Freq: Two times a day (BID) | ORAL | Status: DC
  Administered 2019-04-12 – 2019-04-15 (×3): 90 mg via ORAL
  Filled 2019-03-22 (×23): qty 1

## 2019-03-22 MED ORDER — NOREPINEPHRINE 4 MG/250ML-% IV SOLN
0.0000 ug/min | INTRAVENOUS | Status: DC
  Administered 2019-03-22: 2 ug/min via INTRAVENOUS
  Administered 2019-03-23: 7 ug/min via INTRAVENOUS
  Administered 2019-03-23: 8 ug/min via INTRAVENOUS
  Administered 2019-03-23: 06:00:00 9 ug/min via INTRAVENOUS
  Administered 2019-03-24: 10:00:00 10 ug/min via INTRAVENOUS
  Administered 2019-03-24: 8 ug/min via INTRAVENOUS
  Administered 2019-03-25: 13 ug/min via INTRAVENOUS
  Administered 2019-03-25: 11 ug/min via INTRAVENOUS
  Administered 2019-03-25: 17 ug/min via INTRAVENOUS
  Administered 2019-03-25: 13 ug/min via INTRAVENOUS
  Administered 2019-03-25: 18:00:00 25 ug/min via INTRAVENOUS
  Administered 2019-03-26: 10 ug/min via INTRAVENOUS
  Filled 2019-03-22 (×12): qty 250

## 2019-03-22 MED ORDER — LABETALOL HCL 5 MG/ML IV SOLN: 20.0000 mg | Freq: Once | INTRAVENOUS | Status: DC

## 2019-03-22 MED ORDER — CHLORHEXIDINE GLUCONATE 0.12% ORAL RINSE (MEDLINE KIT)
15.0000 mL | Freq: Two times a day (BID) | OROMUCOSAL | Status: DC
  Administered 2019-03-22 – 2019-03-26 (×8): 15 mL via OROMUCOSAL

## 2019-03-22 MED ORDER — SODIUM CHLORIDE 0.9 % IV SOLN: INTRAVENOUS | Status: DC

## 2019-03-22 MED ORDER — SODIUM CHLORIDE 0.9% FLUSH: 3.0000 mL | Freq: Once | INTRAVENOUS | Status: DC

## 2019-03-22 NOTE — Progress Notes (Signed)
I went and received the patient from IR who was intubated by CRNA during the procedure in IR, placed the patient on Servo I vent and placed on above vent settings per ARDS net protocol, good BBS ausculted over lung fields, SATS 100%, will continue to monitor patient.

## 2019-03-22 NOTE — Progress Notes (Signed)
eLink Physician-Brief Progress Note Patient Name: Jonathan Berger DOB: 03-Apr-1931 MRN: [id number]   Date of Service  04/10/2019  HPI/Events of Note  HR 40s (sinus brady). On Neo to maintain SBP 120-140 after IR procedure.   eICU Interventions  Discontinue Neo. Start levophed drip instead.     Intervention Category Major Interventions: Arrhythmia - evaluation and management  Marveen Reeks Vondra Aldredge 03/19/2019, 9:42 PM

## 2019-03-22 NOTE — H&P (Addendum)
NEURO HOSPITALIST  H&P   Requesting Physician: Dr. Pecola Leisure    Chief Complaint: Slurred speech and left facial droop  History obtained from:  Patient 's wife  HPI:                                                                                                                                         Jonathan Berger is an 86 y.o. male with a PMHx of HTN who presented to the [facility] ED by PV for slurred speech and left facial droop. Code Stroke initiated in the ED.  Wife reports patient was LKN at 1215, which was also the time of symptom onset. He was walking and talking as usual prior to this, at 11:00. She does state that he had been sleeping off and on this morning. At 1215 she noticed that he "sounded funny" and says she recognized that his speech was slurred and that he was drooling out of the left side of his mouth. She yelled for her son to come downstairs and while her back was turned the patient tried to get out of the chair and fell to the floor. He tried to get up and again fell. Some of her son's friends helped get him off the floor and into the car. Per wife, patient takes the following medications. Valsartan-amlodipine-HCTZ, donepezil and memantine.  ED course:  CTH: No hemorrhage or acute hypodensity, dense right MCA BG: 151  BP: 143/88 CTA: Right ICA near-occlusion at origin with distal right M1 thrombus extending into an M2 branch  Weight: 85.8kg  Date last known well: 03/18/2019 Time last known well: 1130; symptom onset 1200 tPA Given: Yes, started @ 1324 Modified Rankin: Rankin Score=2 NIHSS: 6   Past Medical History:  Diagnosis Date  . Anemia   . Chronic back pain    herniated disc  . Hepatitis    50+yrs ago  . Hypertension    takes Amlodipine/Valsartan/HCTZ daily  . Personal history of kidney stones     Past Surgical History:  Procedure Laterality Date  . BACK SURGERY  1977/006  . COLONOSCOPY    . CYSTOSCOPY    .  ESOPHAGOGASTRODUODENOSCOPY    . LITHOTRIPSY    . LUMBAR LAMINECTOMY  01/29/2011   Procedure: MICRODISCECTOMY LUMBAR LAMINECTOMY;  Surgeon: Eldred Manges;  Location: [facility] OR;  Service: Orthopedics;  Laterality: Right;  Right L4-5 Microdiscectomy    Family History  Problem Relation Age of Onset  . Anesthesia problems Neg Hx   . Hypotension Neg Hx   . Malignant hyperthermia Neg Hx   . Pseudochol deficiency Neg Hx  Social History:  reports that he quit smoking about 45 years ago. His smoking use included cigarettes. He has a 7.50 pack-year smoking history. He has never used smokeless tobacco. He reports current alcohol use. He reports that he does not use drugs.  Allergies: No Known Allergies  HOME MEDICATIONS: No current facility-administered medications on file prior to encounter.   Current Outpatient Medications on File Prior to Encounter  Medication Sig Dispense Refill  . amLODipine (NORVASC) 5 MG tablet Take 5 mg by mouth daily.    Marland Kitchen donepezil (ARICEPT) 5 MG tablet Take 5 mg by mouth at bedtime.    . Ensure (ENSURE) Take 237 mLs by mouth 3 (three) times daily between meals.    . memantine (NAMENDA) 5 MG tablet Take 5 mg by mouth 2 (two) times daily.    Marland Kitchen triamcinolone cream (KENALOG) 0.1 % Apply 1 application topically at bedtime. Apply to rash on back of right leg below knee    . valsartan-hydrochlorothiazide (DIOVAN-HCT) 160-12.5 MG tablet Take 1 tablet by mouth daily.    . [DISCONTINUED] amLODipine-valsartan (EXFORGE) 5-320 MG per tablet Take 1 tablet by mouth daily.        ROS:                                                                                                                                       ROS was performed and is negative except as noted in HPI  General Examination:                                                                                                      Weight 85.8 kg.  Physical Exam  Constitutional: Appears well-developed and  well-nourished.  Psych: Affect appropriate to situation Eyes: Normal external eye and conjunctiva. HENT: Normocephalic, no lesions, without obvious abnormality.   Musculoskeletal-no joint tenderness, deformity or swelling Cardiovascular: Normal rate and regular rhythm.  Respiratory: Effort normal, non-labored breathing saturations WNL GI: Soft.  No distension. There is no tenderness.  Skin: WDI  Neurological Examination  Mental Status: Alert, oriented to month, year, city and state, but not date or day of week. Appears confused. Speech is dysfluent and halting with impaired naming and comprehension. Unable to repeat a phrase. At times there is speech arrest lasting for several seconds, during which patient will appear confused and will not answer questions or follow commands.  Cranial Nerves: II:  Visual fields intact to unilateral testing, but with extinction in left temporal hemifield with  DSS. PERRL. III,IV, VI: No ptosis. EOMI. No nystagmus V,VII: Mild left facial droop. Reacts to tactile stimulation bilaterally. Temp intact bilaterally.  VIII: Hearing intact to voice IX,X: No hoarseness noted.  XI: Head is midline XII: midline tongue extension Motor: RUE 5/5 proximally and distally LUE 4+/5 with subtle drifting adduction at shoulder when assessing Barre RLE 5/5 LLE 4/5 Sensory: Temp and light touch intact in BUE and BLE. Positive for extinction to DSS, LUE and LLE Deep Tendon Reflexes:  2+ bilateral brachioradialis and biceps, 1+ right patellar, 0 left patellar Cerebellar: No ataxia with FNF bilaterally Gait: Deferred    Lab Results: Basic Metabolic Panel: Recent Labs  Lab 03/26/2019 1308  NA 137  K 3.6  CL 102  GLUCOSE 155*  BUN 24*  CREATININE 1.30*    CBC: Recent Labs  Lab 03/21/2019 1303 03/25/2019 1308  WBC 9.5  --   NEUTROABS 5.0  --   HGB 16.1 16.0  HCT 48.0 47.0  MCV 97.8  --   PLT 145*  --     CBG: Recent Labs  Lab 03/17/2019 1302  GLUCAP 151*     Imaging: CT HEAD CODE STROKE WO CONTRAST  Result Date: 03/17/2019 CLINICAL DATA:  Code stroke. 86 year old male with left facial droop and aphasia. EXAM: CT HEAD WITHOUT CONTRAST TECHNIQUE: Contiguous axial images were obtained from the base of the skull through the vertex without intravenous contrast. COMPARISON:  Head CT 12/25/2016. FINDINGS: Brain: Cerebral volume is not significantly changed since 2018. No midline shift, mass effect, or evidence of intracranial mass lesion. No ventriculomegaly. No acute intracranial hemorrhage identified. Scattered bilateral cerebral white matter hypodensity has increased. The deep gray nuclei, brainstem and cerebellum remain within normal limits. No cortically based acute infarct identified. Vascular: Calcified atherosclerosis at the skull base. There is asymmetric right MCA M1 or proximal M2 hyperdensity best seen on series 5, image 29 which is new from 2018. Skull: Negative. Sinuses/Orbits: Visualized paranasal sinuses and mastoids are stable and well pneumatized. Other: Negative orbit and scalp soft tissues. ASPECTS [facility] Stroke Program Early CT Score) Total score (0-10 with 10 being normal): 10 IMPRESSION: 1. Hyperdense Right MCA suspicious for emergent large vessel occlusion. But no CT changes of acute cortically based infarct identified. ASPECTS 10. 2. No acute intracranial hemorrhage. Increased scattered white matter hypodensity since 2018. 3. These results were communicated to Dr. [name] at 1:14 pmon 02/02/2021by text page via the [facility] messaging system. Electronically Signed   By: Genevie Ann M.D.   On: 04/02/2019 13:16   CTA of head and neck: 1. Positive for Right MCA Emergent Large Vessel Occlusion affecting the mid and distal M1 segment. 2. Positive also for superimposed Critical Stenosis cervical Right ICA beginning at the origin, throughout the proximal 3.5 cm of the vessel. There are tandem RADIOGRAPHIC STRING SIGN STENOSES. 3. Additional severe  atherosclerosis and multifocal high-grade arterial stenoses elsewhere: - Left ICA distal bulb RADIOGRAPHIC STRING SIGN STENOSIS. - Severe bilateral Vertebral Artery origin stenoses. - Proximal Left Subclavian Artery moderate stenosis due to plaque and tortuosity.  Laurey Morale, MSN, NP-C [facility] [phone] 03/16/2019, 1:02 PM    Assessment: 86 y.o. male with a PMHx of HTN who presented to the [facility] ED by PV for slurred speech and facial droop. Code Stroke initiated in the ED.  1. Exam reveals left facial droop, left sided weakness, left sided sensory extinction and dysphasia.  2. CT head: Hyperdense right MCA suspicious for emergent large vessel occlusion, but no CT changes  of acute cortically based infarct identified. ASPECTS 10. 3. CTA of head and neck reveals right M1 occlusion and right ICA critical stenosis beginning at the origin and extending within the first 3.5 cm of the vessel 4. mRs: 1.  NIHSS: 6 5. Denies taking any blood thinners.. 6. Stroke Risk Factors - hypertension  Plan:  Acute Ischemic Stroke: 1. After comprehensive review of possible contraindications, he has no absolute contraindications to tPA administration. Patient is a tPA candidate. Discussed extensively the risks/benefits of tPA treatment vs. no treatment with the patient's wife, including risks of hemorrhage and death with tPA administration versus worse overall outcomes on average in patients within tPA time window who are not administered tPA. The patient's aphasia precludes meaningful medical decision making on his part at this time. Overall benefits of tPA regarding long-term prognosis are felt to outweigh risks. The patient's wife expressed understanding and wish to proceed with tPA.  2. The patient is a VIR candidate. Risks/benefits of the procedure were discussed extensively with the patient's wife, including approximately 50% chance of significant improvement relative to an approximate 10%  chance of subarachnoid hemorrhage with possibility of significant worsening including death. The patient's wife expressed understanding and provided informed consent to proceed with VIR. All questions answered. Consent form signed. 3. Following VIR, will admit to the Neuro ICU. 2. Post-tPA and VIR orders to include frequent neuro checks and BP management. No anticoagulants for at least 24 hours following tPA and VIR. If stent is placed in VIR, then antiplatelet medication may be indicated - will defer to Dr. Corliss Skains. 4. DVT prophylaxis with SCDs.  5. CT head at 24 hours to assess for possible hemorrhagic conversion.  6. Carotid ultrasound to further evaluate left ICA severe atherosclerotic narrowing. May be candidate for CEA. This is the asymptomatic side.  7. TTE.  8. MRI brain  9. Fasting lipid panel, HgbA1c 10. PT/OT/Speech.  11. NPO until passes swallow evaluation.  12. Telemetry monitoring  HTN: Continue his home antihypertensive.  BP goal for next 24 hours per IR team  Baseline cognitive impairment: Continue home donepezil.   Code status: Full code  History of hepatitis LFTs are normal  -- Please page stroke NP  Or  PA  Or MD from 8am -4 pm  as this patient from this time will be  followed by the stroke.   You can look them up on [email or web address]  Password [email or web address]  60 minutes spent in the emergent neurological evaluation and management of this critically ill patient.   Electronically signed: Dr. Caryl Pina

## 2019-03-22 NOTE — Progress Notes (Signed)
PHARMACIST CODE STROKE RESPONSE  Notified to mix tPA at 1319 by Dr. Otelia Limes Delivered tPA to RN at 1322  tPA dose = 7.7mg  bolus over 1 minute followed by 69.5mg  for a total dose of 77.2mg  over 1 hour  Daylene Posey 2019/04/04 1:27 PM

## 2019-03-22 NOTE — Progress Notes (Signed)
Patient ID: Jonathan Berger, male   DOB: 1931-12-05, 86 y.o.   MRN: [id number]  INR. 3 Y RT H M LSW 11.30am New onset of slurred speech Lt sided weakness and lt sided neglect. CT brain NO ICH . ASPECTS 10  CTA near complete  occlusion of RT ICA prox with string sign, And large nearly occlusive filling defect in RT MCA extending into inf division. Endovascular treatment D/W spouse in  A 3 way telephone call.Procedure,reasons alternatives reviwed. Risks of ICH df 10 %,worsening neuro deficit ,death and inability to revascularize were discussed. Spouse expressed understanding and provided consent to the treatment. S.Terique Kawabata MD

## 2019-03-22 NOTE — Anesthesia Procedure Notes (Signed)
Procedure Name: Intubation Date/Time: 03/21/2019 2:38 PM Performed by: Edmonia Caprio, CRNA Pre-anesthesia Checklist: Emergency Drugs available, Suction available, Patient being monitored, Timeout performed and Patient identified Patient Re-evaluated:Patient Re-evaluated prior to induction Oxygen Delivery Method: Ambu bag Preoxygenation: Pre-oxygenation with 100% oxygen Induction Type: IV induction and Rapid sequence Laryngoscope Size: Glidescope and 4 Grade View: Grade I Tube type: Oral Tube size: 7.5 mm Number of attempts: 1 Airway Equipment and Method: Stylet and Video-laryngoscopy Placement Confirmation: ETT inserted through vocal cords under direct vision,  breath sounds checked- equal and bilateral and positive ETCO2 Secured at: 23 cm Tube secured with: Tape Dental Injury: Teeth and Oropharynx as per pre-operative assessment  Comments: Intubated by Lillette Boxer CRNA

## 2019-03-22 NOTE — Anesthesia Preprocedure Evaluation (Signed)
Anesthesia Evaluation  Patient identified by MRN, date of birth, ID band Patient awake    Reviewed: Allergy & Precautions, NPO status , Patient's Chart, lab work & pertinent test resultsPreop documentation limited or incomplete due to emergent nature of procedure.  Airway        Dental   Pulmonary former smoker,           Cardiovascular hypertension,      Neuro/Psych    GI/Hepatic (+) Hepatitis -  Endo/Other    Renal/GU      Musculoskeletal   Abdominal   Peds  Hematology  (+) anemia ,   Anesthesia Other Findings   Reproductive/Obstetrics                             Anesthesia Physical Anesthesia Plan  ASA: III  Anesthesia Plan: General   Post-op Pain Management:    Induction: Intravenous  PONV Risk Score and Plan: 2 and Ondansetron  Airway Management Planned: Oral ETT  Additional Equipment:   Intra-op Plan:   Post-operative Plan: Possible Post-op intubation/ventilation  Informed Consent: I have reviewed the patients History and Physical, chart, labs and discussed the procedure including the risks, benefits and alternatives for the proposed anesthesia with the patient or authorized representative who has indicated his/her understanding and acceptance.       Plan Discussed with: CRNA and Anesthesiologist  Anesthesia Plan Comments:         Anesthesia Quick Evaluation

## 2019-03-22 NOTE — Progress Notes (Signed)
eLink Physician-Brief Progress Note Patient Name: Jonathan Berger DOB: [birth year] MRN: [id number]   Date of Service  April 20, 2019  HPI/Events of Note  Request from RN for Neo drip (already running from IR procedure) to maintain SBP 120-140 (IR's goal post-procedure).  Also to order continue foley and restraint for right leg d/t femoral sheath.   eICU Interventions  Orders entered as described above.     Intervention Category Major Interventions: Other:  [name] 2019-04-20, 8:18 PM

## 2019-03-22 NOTE — Transfer of Care (Signed)
Immediate Anesthesia Transfer of Care Note  Patient: Jonathan Berger  Procedure(s) Performed: RADIOLOGY WITH ANESTHESIA (N/A )  Patient Location: ICU  Anesthesia Type:General  Level of Consciousness: sedated and Patient remains intubated per anesthesia plan  Airway & Oxygen Therapy: Patient remains intubated per anesthesia plan and Patient placed on Ventilator (see vital sign flow sheet for setting)  Post-op Assessment: Report given to RN and Post -op Vital signs reviewed and stable  Post vital signs: Reviewed and stable  Last Vitals:  Vitals Value Taken Time  BP 94/70 04/11/2019 1749  Temp    Pulse 104 04-11-19 1756  Resp 20 2019/04/11 1756  SpO2 96 % 2019/04/11 1756  Vitals shown include unvalidated device data.  Last Pain:  Vitals:   04-11-2019 1354  TempSrc: Oral         Complications: No apparent anesthesia complications

## 2019-03-22 NOTE — ED Notes (Signed)
Pt taken straight to bridge and NS notified to activate Code Stroke.  Dr. Madilyn Hook at bridge to assess pt.  Wife reports pt has sudden onset of L sided facial droop, slurred speech, and unsteady gait with a fall at 12:15pm.  Denies injury from fall.

## 2019-03-22 NOTE — Anesthesia Postprocedure Evaluation (Signed)
Anesthesia Post Note  Patient: Jonathan Berger  Procedure(s) Performed: RADIOLOGY WITH ANESTHESIA (N/A )     Patient location during evaluation: ICU Anesthesia Type: General Level of consciousness: patient remains intubated per anesthesia plan Pain management: pain level controlled Vital Signs Assessment: post-procedure vital signs reviewed and stable Respiratory status: patient remains intubated per anesthesia plan Cardiovascular status: stable Postop Assessment: no apparent nausea or vomiting Anesthetic complications: no    Last Vitals:  Vitals:   03/16/2019 2100 04/06/2019 2115  BP: 118/71   Pulse: (!) 44 (!) 47  Resp: 18 18  Temp:    SpO2: 99% 100%    Last Pain:  Vitals:   03/29/2019 1354  TempSrc: Oral                 Lavone Weisel

## 2019-03-22 NOTE — Code Documentation (Signed)
Code Documentation --- Code Stroke:   Arrived to ED at 1250. Patient arrived from home with wife via private vehicle with LT sided facial droop, slurred speech, and unsteady gait with a fall. LSN at 1215. Patient taken to CT, CTH/CTA done. TPA started at 1324 (time to decision at 1319). + RT MCA ELVO and + critical stenosis of cervical RICA. NVIR notified at 1350 and patient taken to [facility] intubation bay at 1407. Patient was intubated and taken toIR suite.

## 2019-03-22 NOTE — ED Provider Notes (Signed)
[facility] EMERGENCY DEPARTMENT Provider Note   CSN: [id number] Arrival date & time: 04/22/2019  1245  An emergency department physician performed an initial assessment on this suspected stroke patient at 1253.  History Chief Complaint  Patient presents with  . Code Stroke    Jonathan Berger is a 86 y.o. male.  The history is provided by the patient, the spouse and medical records. No language interpreter was used.   Jonathan Berger is a 86 y.o. male who presents to the Emergency Department complaining of speech difficulties. Level V caveat due to confusion. History is provided by the patient's wife. She reports that at noon he was completely normal. Around 1215 he started having slurring of his speech with some facial droop and difficulty walking. He fell but did not sustain any injuries. She was able to get him back up and that he had a second fall. No prior similar symptoms. No recent illnesses.    Past Medical History:  Diagnosis Date  . Anemia   . Chronic back pain    herniated disc  . Hepatitis    50+yrs ago  . Hypertension    takes Amlodipine/Valsartan/HCTZ daily  . Personal history of kidney stones     Patient Active Problem List   Diagnosis Date Noted  . 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 Herniated nucleus pulposus, L4-5 right 01/26/2011    Past Surgical History:  Procedure Laterality Date  . BACK SURGERY  1977/006  . COLONOSCOPY    . CYSTOSCOPY    . ESOPHAGOGASTRODUODENOSCOPY    . LITHOTRIPSY    . LUMBAR LAMINECTOMY  01/29/2011   Procedure: MICRODISCECTOMY LUMBAR LAMINECTOMY;  Surgeon: Eldred Manges;  Location: [facility] OR;  Service: Orthopedics;  Laterality: Right;  Right L4-5 Microdiscectomy       Family History  Problem Relation Age of Onset  . Anesthesia problems Neg Hx   . Hypotension Neg Hx   . Malignant hyperthermia Neg Hx   . Pseudochol deficiency Neg Hx     Social History   Tobacco Use  . Smoking status: Former Smoker   Packs/day: 0.25    Years: 30.00    Pack years: 7.50    Types: Cigarettes    Quit date: 01/25/1974    Years since quitting: 45.1  . Smokeless tobacco: Never Used  Substance Use Topics  . Alcohol use: Yes    Comment: occ beer  . Drug use: No    Home Medications Prior to Admission medications   Medication Sig Start Date End Date Taking? Authorizing Provider  Amlodipine-Valsartan-HCTZ 10-160-12.5 MG TABS Take 0.5 tablets by mouth daily.    [provider]  amLODipine-valsartan (EXFORGE) 5-320 MG per tablet Take 1 tablet by mouth daily.    04/27/11  [provider]    Allergies    Patient has no known allergies.  Review of Systems   Review of Systems  All other systems reviewed and are negative.   Physical Exam Updated Vital Signs BP (!) 144/96 (BP Location: Right Arm)   Pulse 84   Temp 98.2 F (36.8 C) (Oral)   Resp 16   Wt 85.8 kg   SpO2 95%   BMI 26.38 kg/m   Physical Exam Vitals and nursing note reviewed.  Constitutional:      Appearance: He is well-developed.  HENT:     Head: Normocephalic and atraumatic.  Cardiovascular:     Rate and Rhythm: Normal rate and regular rhythm.     Heart  sounds: No murmur.  Pulmonary:     Effort: Pulmonary effort is normal. No respiratory distress.     Breath sounds: Normal breath sounds.  Abdominal:     Palpations: Abdomen is soft.     Tenderness: There is no abdominal tenderness. There is no guarding or rebound.  Musculoskeletal:        General: No swelling or tenderness.  Skin:    General: Skin is warm and dry.  Neurological:     Mental Status: He is alert.     Comments: Confused. Mild aphasia. Slow to answer questions. Left lower facial droop. There is some weakness on grip strength in the left upper extremity but no pronator drift.  Psychiatric:        Behavior: Behavior normal.     ED Results / Procedures / Treatments   Labs (all labs ordered are listed, but only abnormal results are  displayed) Labs Reviewed  CBC - Abnormal; Notable for the following components:      Result Value   Platelets 145 (*)    All other components within normal limits  COMPREHENSIVE METABOLIC PANEL - Abnormal; Notable for the following components:   Glucose, Bld 160 (*)    Creatinine, Ser 1.36 (*)    GFR calc non Af Amer 47 (*)    GFR calc Af Amer 54 (*)    All other components within normal limits  I-STAT CHEM 8, ED - Abnormal; Notable for the following components:   BUN 24 (*)    Creatinine, Ser 1.30 (*)    Glucose, Bld 155 (*)    All other components within normal limits  CBG MONITORING, ED - Abnormal; Notable for the following components:   Glucose-Capillary 151 (*)    All other components within normal limits  CBG MONITORING, ED - Abnormal; Notable for the following components:   Glucose-Capillary 149 (*)    All other components within normal limits  RESPIRATORY PANEL BY RT PCR (FLU A&B, COVID)  PROTIME-INR  APTT  DIFFERENTIAL    EKG None  Radiology CT Code Stroke CTA Head W/WO contrast  Result Date: 11-Apr-2019 CLINICAL DATA:  86 year old male code stroke presentation with hyperdense right MCA on plain head CT. EXAM: CT ANGIOGRAPHY HEAD AND NECK TECHNIQUE: Multidetector CT imaging of the head and neck was performed using the standard protocol during bolus administration of intravenous contrast. Multiplanar CT image reconstructions and MIPs were obtained to evaluate the vascular anatomy. Carotid stenosis measurements (when applicable) are obtained utilizing NASCET criteria, using the distal internal carotid diameter as the denominator. CONTRAST:  6mL OMNIPAQUE IOHEXOL 350 MG/ML SOLN COMPARISON:  Plain head CT 1306 hours today. FINDINGS: CTA NECK Skeleton: Absent maxillary dentition. Advanced degenerative changes in the cervical spine. No acute osseous abnormality identified. Upper chest: Mild upper lung atelectasis. No superior mediastinal lymphadenopathy. Other neck: Negative, no  acute findings in the neck. Aortic arch: 3 vessel arch configuration. Mild to moderate arch atherosclerosis, with mostly soft plaque in the distal arch. Right carotid system: No brachiocephalic or right CCA origin stenosis despite some plaque. No stenosis proximal to the bifurcation, but there is bulky soft plaque or thrombus throughout the right ICA origin and bulb resulting in critical radiographic string sign stenosis on series 9, image 51. Superimposed additional bulky calcified plaque distal to the bulb also resulting in tandem string sign stenoses (series 5, image 105 and series 9 image 50). Despite this the vessel remains patent although is asymmetrically smaller to the skull base (about 3  mm diameter). Left carotid system: No left CCA origin stenosis despite plaque. At the left carotid bifurcation the left ICA origin is patent, but at the distal bulb there is bulky calcified more so than soft plaque resulting in severe stenosis at or approaching a radiographic string sign on series 5, image 103. The vessel remains patent and has a fairly normal caliber distal to this region to the skull base. Vertebral arteries: Calcified plaque in the proximal right subclavian artery with less than 50 % stenosis with respect to the distal vessel. Minor calcified plaque at the right vertebral artery origin without stenosis. However, there is bulky soft plaque in the right V1 segment resulting in moderate to severe stenosis on series 7, image 272. Distal to this the right vertebral caliber remains normal, and the vessel is then patent to the skull base without additional stenosis. Soft and calcified plaque plus tortuosity of the proximal left subclavian artery resulting in a kinked appearance and moderate stenosis as seen on series 9, image 109. Superimposed severe stenosis at the left vertebral artery origin due to soft plaque on series 8, image 133. The left vertebral is fairly codominant and then patent to the skull base  without additional stenosis. CTA HEAD Posterior circulation: Non dominant left vertebral artery with mild calcified plaque does remain patent to the vertebrobasilar junction without stenosis. Moderate right V4 segment calcification with mild stenosis. Patent vertebrobasilar junction and basilar artery without stenosis. SCA and PCA origins are patent. Posterior communicating arteries are diminutive or absent. There is mild bilateral PCA irregularity and stenosis. Anterior circulation: Both ICA siphons are patent. On the left there is mild to moderate calcified plaque without stenosis. Normal left ophthalmic artery origin. On the right there is mild calcified plaque without stenosis. Normal right ophthalmic artery origin. Patent carotid termini. Patent bilateral MCA and left ACA origins. The left A1 is dominant and the right diminutive or absent. Anterior communicating artery and bilateral ACA branches are within normal limits. Left MCA M1 segment and bifurcation are patent without stenosis. Right MCA proximal M1 segment is patent but becomes near occluded more distally corresponding to the hyperdense segment by plain CT. The mid and distal right M1 segment is affected (series 11, image 20 and series 10, image 19) with intermediate to poor reconstitution of right M2 and M3 branches (series 12, image 9). Venous sinuses: Early contrast timing, grossly patent. Anatomic variants: Mildly dominant right vertebral artery. Review of the MIP images confirms the above findings IMPRESSION: 1. Positive for Right MCA Emergent Large Vessel Occlusion affecting the mid and distal M1 segment. 2. Positive also for superimposed Critical Stenosis cervical Right ICA beginning at the origin, throughout the proximal 3.5 cm of the vessel. There are tandem RADIOGRAPHIC STRING SIGN STENOSES. 3. The above was discussed by telephone with to Dr. Otelia Limes at 1338 hours on 03/24/2019. 4. Additional severe atherosclerosis and multifocal high-grade  arterial stenoses elsewhere: - Left ICA distal bulb RADIOGRAPHIC STRING SIGN STENOSIS. - Severe bilateral Vertebral Artery origin stenoses. - proximal Left Subclavian Artery moderate stenosis due to plaque and tortuosity. 5.  Aortic Atherosclerosis (ICD10-I70.0). Electronically Signed   By: Odessa Fleming M.D.   On: 04/03/2019 14:16   CT Code Stroke CTA Neck W/WO contrast  Result Date: 03/19/2019 CLINICAL DATA:  86 year old male code stroke presentation with hyperdense right MCA on plain head CT. EXAM: CT ANGIOGRAPHY HEAD AND NECK TECHNIQUE: Multidetector CT imaging of the head and neck was performed using the standard protocol during bolus administration  of intravenous contrast. Multiplanar CT image reconstructions and MIPs were obtained to evaluate the vascular anatomy. Carotid stenosis measurements (when applicable) are obtained utilizing NASCET criteria, using the distal internal carotid diameter as the denominator. CONTRAST:  80mL OMNIPAQUE IOHEXOL 350 MG/ML SOLN COMPARISON:  Plain head CT 1306 hours today. FINDINGS: CTA NECK Skeleton: Absent maxillary dentition. Advanced degenerative changes in the cervical spine. No acute osseous abnormality identified. Upper chest: Mild upper lung atelectasis. No superior mediastinal lymphadenopathy. Other neck: Negative, no acute findings in the neck. Aortic arch: 3 vessel arch configuration. Mild to moderate arch atherosclerosis, with mostly soft plaque in the distal arch. Right carotid system: No brachiocephalic or right CCA origin stenosis despite some plaque. No stenosis proximal to the bifurcation, but there is bulky soft plaque or thrombus throughout the right ICA origin and bulb resulting in critical radiographic string sign stenosis on series 9, image 51. Superimposed additional bulky calcified plaque distal to the bulb also resulting in tandem string sign stenoses (series 5, image 105 and series 9 image 50). Despite this the vessel remains patent although is  asymmetrically smaller to the skull base (about 3 mm diameter). Left carotid system: No left CCA origin stenosis despite plaque. At the left carotid bifurcation the left ICA origin is patent, but at the distal bulb there is bulky calcified more so than soft plaque resulting in severe stenosis at or approaching a radiographic string sign on series 5, image 103. The vessel remains patent and has a fairly normal caliber distal to this region to the skull base. Vertebral arteries: Calcified plaque in the proximal right subclavian artery with less than 50 % stenosis with respect to the distal vessel. Minor calcified plaque at the right vertebral artery origin without stenosis. However, there is bulky soft plaque in the right V1 segment resulting in moderate to severe stenosis on series 7, image 272. Distal to this the right vertebral caliber remains normal, and the vessel is then patent to the skull base without additional stenosis. Soft and calcified plaque plus tortuosity of the proximal left subclavian artery resulting in a kinked appearance and moderate stenosis as seen on series 9, image 109. Superimposed severe stenosis at the left vertebral artery origin due to soft plaque on series 8, image 133. The left vertebral is fairly codominant and then patent to the skull base without additional stenosis. CTA HEAD Posterior circulation: Non dominant left vertebral artery with mild calcified plaque does remain patent to the vertebrobasilar junction without stenosis. Moderate right V4 segment calcification with mild stenosis. Patent vertebrobasilar junction and basilar artery without stenosis. SCA and PCA origins are patent. Posterior communicating arteries are diminutive or absent. There is mild bilateral PCA irregularity and stenosis. Anterior circulation: Both ICA siphons are patent. On the left there is mild to moderate calcified plaque without stenosis. Normal left ophthalmic artery origin. On the right there is mild  calcified plaque without stenosis. Normal right ophthalmic artery origin. Patent carotid termini. Patent bilateral MCA and left ACA origins. The left A1 is dominant and the right diminutive or absent. Anterior communicating artery and bilateral ACA branches are within normal limits. Left MCA M1 segment and bifurcation are patent without stenosis. Right MCA proximal M1 segment is patent but becomes near occluded more distally corresponding to the hyperdense segment by plain CT. The mid and distal right M1 segment is affected (series 11, image 20 and series 10, image 19) with intermediate to poor reconstitution of right M2 and M3 branches (series 12, image 9).  Venous sinuses: Early contrast timing, grossly patent. Anatomic variants: Mildly dominant right vertebral artery. Review of the MIP images confirms the above findings IMPRESSION: 1. Positive for Right MCA Emergent Large Vessel Occlusion affecting the mid and distal M1 segment. 2. Positive also for superimposed Critical Stenosis cervical Right ICA beginning at the origin, throughout the proximal 3.5 cm of the vessel. There are tandem RADIOGRAPHIC STRING SIGN STENOSES. 3. The above was discussed by telephone with to Dr. Otelia LimesLindzen at 1338 hours on 03/26/2019. 4. Additional severe atherosclerosis and multifocal high-grade arterial stenoses elsewhere: - Left ICA distal bulb RADIOGRAPHIC STRING SIGN STENOSIS. - Severe bilateral Vertebral Artery origin stenoses. - proximal Left Subclavian Artery moderate stenosis due to plaque and tortuosity. 5.  Aortic Atherosclerosis (ICD10-I70.0). Electronically Signed   By: Odessa FlemingH  Hall M.D.   On: 06-09-2019 14:16   CT HEAD CODE STROKE WO CONTRAST  Result Date: 03/20/2019 CLINICAL DATA:  Code stroke. 86 year old male with left facial droop and aphasia. EXAM: CT HEAD WITHOUT CONTRAST TECHNIQUE: Contiguous axial images were obtained from the base of the skull through the vertex without intravenous contrast. COMPARISON:  Head CT 12/25/2016.  FINDINGS: Brain: Cerebral volume is not significantly changed since 2018. No midline shift, mass effect, or evidence of intracranial mass lesion. No ventriculomegaly. No acute intracranial hemorrhage identified. Scattered bilateral cerebral white matter hypodensity has increased. The deep gray nuclei, brainstem and cerebellum remain within normal limits. No cortically based acute infarct identified. Vascular: Calcified atherosclerosis at the skull base. There is asymmetric right MCA M1 or proximal M2 hyperdensity best seen on series 5, image 29 which is new from 2018. Skull: Negative. Sinuses/Orbits: Visualized paranasal sinuses and mastoids are stable and well pneumatized. Other: Negative orbit and scalp soft tissues. ASPECTS [facility]([place] Stroke Program Early CT Score) Total score (0-10 with 10 being normal): 10 IMPRESSION: 1. Hyperdense Right MCA suspicious for emergent large vessel occlusion. But no CT changes of acute cortically based infarct identified. ASPECTS 10. 2. No acute intracranial hemorrhage. Increased scattered white matter hypodensity since 2018. 3. These results were communicated to Dr. Otelia LimesLindzen at 1:14 pmon 2/7/2021by text page via the [facility] messaging system. Electronically Signed   By: Odessa FlemingH  Hall M.D.   On: 06-09-2019 13:16    Procedures Procedures (including critical care time) CRITICAL CARE Performed by: Tilden FossaElizabeth Katisha Shimizu   Total critical care time: 35 minutes  Critical care time was exclusive of separately billable procedures and treating other patients.  Critical care was necessary to treat or prevent imminent or life-threatening deterioration.  Critical care was time spent personally by me on the following activities: development of treatment plan with patient and/or surrogate as well as nursing, discussions with consultants, evaluation of patient's response to treatment, examination of patient, obtaining history from patient or surrogate, ordering and performing treatments and  interventions, ordering and review of laboratory studies, ordering and review of radiographic studies, pulse oximetry and re-evaluation of patient's condition.  Medications Ordered in ED Medications  sodium chloride flush (NS) 0.9 % injection 3 mL (has no administration in time range)  alteplase (ACTIVASE) 1 mg/mL infusion 77.2 mg (has no administration in time range)    Followed by  0.9 %  sodium chloride infusion (has no administration in time range)  iohexol (OMNIPAQUE) 300 MG/ML solution 150 mL (has no administration in time range)  tirofiban (AGGRASTAT) 5-0.9 MG/100ML-% injection (has no administration in time range)  clopidogrel (PLAVIX) 300 MG tablet (has no administration in time range)  eptifibatide (INTEGRILIN) 20 MG/10ML injection (has no  administration in time range)  nitroGLYCERIN 100 mcg/mL intra-arterial injection (has no administration in time range)   stroke: mapping our early stages of recovery book (has no administration in time range)  0.9 %  sodium chloride infusion (has no administration in time range)  acetaminophen (TYLENOL) tablet 650 mg (has no administration in time range)    Or  acetaminophen (TYLENOL) 160 MG/5ML solution 650 mg (has no administration in time range)    Or  acetaminophen (TYLENOL) suppository 650 mg (has no administration in time range)  senna-docusate (Senokot-S) tablet 1 tablet (has no administration in time range)  pantoprazole (PROTONIX) injection 40 mg (has no administration in time range)  labetalol (NORMODYNE) injection 20 mg (has no administration in time range)    And  clevidipine (CLEVIPREX) infusion 0.5 mg/mL (has no administration in time range)  amLODIPine-Valsartan-HCTZ 10-160-12.5 MG TABS 0.5 tablet (has no administration in time range)  donepezil (ARICEPT) tablet 10 mg (has no administration in time range)  ceFAZolin (ANCEF) 2-4 GM/100ML-% IVPB (has no administration in time range)  iohexol (OMNIPAQUE) 350 MG/ML injection 50 mL  (50 mLs Intravenous Contrast Given 04/08/2019 1357)  aspirin 81 MG chewable tablet (81 mg  Given by Other 04/09/2019 1502)  ticagrelor (BRILINTA) 90 MG tablet (180 mg  Given by Other 03/20/2019 1502)    ED Course  I have reviewed the triage vital signs and the nursing notes.  Pertinent labs & imaging results that were available during my care of the patient were reviewed by me and considered in my medical decision making (see chart for details).    MDM Rules/Calculators/A&P                     Patient here for evaluation of abrupt onside weakness and speech changes. Code activated on patient's ED arrival.  He was sent immediately to CT scan and evaluated by neurology. TPA given it by neurology team. Plan to transfer emergently to interventional radiology for possible thrombectomy.  Final Clinical Impression(s) / ED Diagnoses Final diagnoses:  Stroke (cerebrum) [facility])    Rx / [place] Orders ED Discharge Orders    None       Quintella Reichert, MD 03/24/2019 [phone]

## 2019-03-22 NOTE — Anesthesia Procedure Notes (Signed)
Arterial Line Insertion Start/End2021/03/07 2:50 PM, 19-Apr-2019 2:55 PM Performed by: Edmonia Caprio, CRNA, CRNA  Preanesthetic checklist: patient identified, IV checked, monitors and equipment checked and timeout performed Emergency situation radial was placed Catheter size: 20 G Hand hygiene performed  and maximum sterile barriers used   Attempts: 2 Procedure performed without using ultrasound guided technique. Following insertion, dressing applied and Biopatch. Post procedure assessment: normal  Patient tolerated the procedure well with no immediate complications.

## 2019-03-22 NOTE — Procedures (Signed)
S/P bilateral common carotid arteriograms followed by complete revascularization of RT MCA pre occlusive thrombus following balloon angioplasty with stent placement at prox RT ICA for severe pre occlusive stenosis ,and balloon angioplasty of heavily calcified plaque in proc RT ICAA with a patency  Of approx 80 % .  With TICI 3 revascularization  S.Cyle Kenyon MD  Post procedure CT Brain neg for hemorrhage or mass effect. RT groin sheath left inplace because of TPA .  Distal pulses dopplerable.bilaterally. S.Makalyn Lennox MD

## 2019-03-22 NOTE — Consult Note (Addendum)
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  He only takes 2 medications for hypertension.  Around 12 noon 03/28/2019 wife noted he had slurred speech was having difficulty standing.  He was transported per private vehicle to [facility] emergency department where code stroke was called at that time.  He was subsequently taken to interventional radiology he underwent a right MCA embolization and revascularization.  He was transferred to the neurosurgical ICU unit where he remained intubated overnight.  He is noted to have endotracheal tube in place left radial A-line, right groin 8 sheath is in place. He is connected to ventilator via endotracheal tube with adequate ventilations and O2 saturations of 100%.  Pulmonary critical care will manage his care overnight.  Blood pressure goal 1 20-1 40 SIGNIFICANT PAST MEDICAL HISTORY   Hypertension  SIGNIFICANT EVENTS:  04/02/2019 right MCA stroke STUDIES:    CULTURES:    ANTIBIOTICS:    LINES/TUBES:  04/08/2019 endotracheal tube 03/24/2019 left radial A-line 04/10/2019 right femoral sheath  CONSULTANTS:  03/25/2019 pulmonary critical care SUBJECTIVE:  86 year old male who is sedated under neuromuscular blockade following interventional radiology procedure                                  CONSTITUTIONAL: BP (!) 144/96 (BP Location: Right Arm)   Pulse 84   Temp 98.2 F (36.8 C) (Oral)   Resp 16   Ht (P) 5\' 11"  (1.803 m)   Wt 85.8 kg   SpO2 (P) 100%   BMI (P) 26.38 kg/m   No intake/output data recorded.     Vent Mode: (P) PRVC FiO2 (%):   [40 %] (P) 40 % Set Rate:  [15 bmp] (P) 15 bmp Vt Set:  [600 mL] (P) 600 mL PEEP:  [5 cmH20] (P) 5 cmH20 Plateau Pressure:  [15 cmH20] (P) 15 cmH20  PHYSICAL EXAM: General: Elderly male who is currently under neuromuscular blockade from procedure Neuro: Neuromuscular blockade is in place HEENT: Pupils equal reactive neuro pinpoint.  Endotracheal tube is in place Cardiovascular: Heart sounds regular regular rhythm Lungs: Decreased breath sounds in the bases Abdomen: Soft nontender Musculoskeletal: Grossly intact Skin: Warm and dry                                                                                                                                    RESOLVED PROBLEM LIST   ASSESSMENT AND PLAN   Vent dependent respiratory failure secondary to procedure with plan is  to remain intubated overnight with extubation in a.m. Vent bundle Proper sedation for tube tolerance Chest x-ray to confirm endotracheal tube placement Plan is to extubate in a.m. after the effects of TPA worn off  Status post right MCA stroke with embolization revascularization. Per interventional radiology Goal of blood pressure is 1 20-1 40  Status post TPA administration Avoid needlesticks for 12 hours    SUMMARY OF TODAY'S PLAN:  86 year old male who is normal state of good health.  He only takes 2 medications for hypertension.  Around 12 noon 03/19/2019 wife noted he had slurred speech was having difficulty standing.  He was transported per private vehicle to [facility] [place] emergency department where code stroke was called at that time.  He was subsequently taken to interventional radiology he underwent a right MCA embolization and revascularization.  He was transferred to the neurosurgical ICU unit where he remained intubated overnight.  He is noted to have endotracheal tube in place left radial A-line, right groin 8 sheath is in place. He is connected to ventilator via endotracheal tube with adequate  ventilations and O2 saturations of 100%.  Pulmonary critical care will manage his care overnight.  Blood pressure goal 1 20-1 40  Best Practice / Goals of Care / Disposition.   DVT PROPHYLAXIS: Pneumatic stockings SUP: PPI NUTRITION: N.p.o. MOBILITY: Bedrest GOALS OF CARE: Full code FAMILY DISCUSSIONS: Family updated per neurology DISPOSITION intensive care unit  LABS  Glucose Recent Labs  Lab 03/19/2019 1302 03/23/2019 1353  GLUCAP 151* 149*    BMET Recent Labs  Lab 03/21/2019 1303 03/28/2019 1308  NA 136 137  K 3.6 3.6  CL 102 102  CO2 25  --   BUN 22 24*  CREATININE 1.36* 1.30*  GLUCOSE 160* 155*    Liver Enzymes Recent Labs  Lab 03/16/2019 1303  AST 19  ALT 15  ALKPHOS 54  BILITOT 0.8  ALBUMIN 3.5    Electrolytes Recent Labs  Lab 04/04/2019 1303  CALCIUM 10.3    CBC Recent Labs  Lab 03/17/2019 1303 03/27/2019 1308  WBC 9.5  --   HGB 16.1 16.0  HCT 48.0 47.0  PLT 145*  --     ABG No results for input(s): PHART, PCO2ART, PO2ART in the last 168 hours.  Coag's Recent Labs  Lab 03/26/2019 1303  APTT 28  INR 0.9    Sepsis Markers No results for input(s): LATICACIDVEN, PROCALCITON, O2SATVEN in the last 168 hours.  Cardiac Enzymes No results for input(s): TROPONINI, PROBNP in the last 168 hours.  PAST MEDICAL HISTORY :   He  has a past medical history of Anemia, Chronic back pain, Hepatitis, Hypertension, and Personal history of kidney stones.  PAST SURGICAL HISTORY:  He  has a past surgical history that includes Lumbar laminectomy (01/29/2011); Back surgery (1977/006); Colonoscopy; Esophagogastroduodenoscopy; Lithotripsy; and Cystoscopy.  No Known Allergies  No current facility-administered medications on file prior to encounter.   Current Outpatient Medications on File Prior to Encounter  Medication Sig  . Amlodipine-Valsartan-HCTZ 10-160-12.5 MG TABS Take 0.5 tablets by mouth daily.  . [DISCONTINUED] amLODipine-valsartan (EXFORGE) 5-320 MG per  tablet Take 1 tablet by mouth daily.      FAMILY HISTORY:   His family history is negative for Anesthesia problems, Hypotension, Malignant hyperthermia, and Pseudochol deficiency.  SOCIAL HISTORY:  He  reports that he quit smoking about 45 years ago. His smoking use included cigarettes. He has a 7.50 pack-year smoking history. He has never used smokeless tobacco. He reports current  alcohol use. He reports that he does not use drugs.  REVIEW OF SYSTEMS:    NA  App cct 45 min   Brett Canales Vedant Shehadeh ACNP Acute Care Nurse Practitioner [facility] Pulmonary/Critical Care Please consult Amion 04/11/2019, 5:54 PM

## 2019-03-23 ENCOUNTER — Encounter (HOSPITAL_COMMUNITY): Payer: Self-pay | Admitting: Neurology

## 2019-03-23 ENCOUNTER — Inpatient Hospital Stay (HOSPITAL_COMMUNITY): Payer: Medicare Other

## 2019-03-23 ENCOUNTER — Other Ambulatory Visit: Payer: Self-pay

## 2019-03-23 DIAGNOSIS — I639 Cerebral infarction, unspecified: Secondary | ICD-10-CM

## 2019-03-23 DIAGNOSIS — I6523 Occlusion and stenosis of bilateral carotid arteries: Secondary | ICD-10-CM

## 2019-03-23 DIAGNOSIS — J9600 Acute respiratory failure, unspecified whether with hypoxia or hypercapnia: Secondary | ICD-10-CM

## 2019-03-23 DIAGNOSIS — I10 Essential (primary) hypertension: Secondary | ICD-10-CM

## 2019-03-23 DIAGNOSIS — F0280 Dementia in other diseases classified elsewhere without behavioral disturbance: Secondary | ICD-10-CM

## 2019-03-23 DIAGNOSIS — G301 Alzheimer's disease with late onset: Secondary | ICD-10-CM

## 2019-03-23 DIAGNOSIS — I9589 Other hypotension: Secondary | ICD-10-CM

## 2019-03-23 DIAGNOSIS — R1312 Dysphagia, oropharyngeal phase: Secondary | ICD-10-CM

## 2019-03-23 DIAGNOSIS — E7800 Pure hypercholesterolemia, unspecified: Secondary | ICD-10-CM

## 2019-03-23 LAB — POCT I-STAT 7, (LYTES, BLD GAS, ICA,H+H)
Acid-base deficit: 6 mmol/L — ABNORMAL HIGH (ref 0.0–2.0)
Bicarbonate: 18.8 mmol/L — ABNORMAL LOW (ref 20.0–28.0)
Calcium, Ion: 1.21 mmol/L (ref 1.15–1.40)
HCT: 35 % — ABNORMAL LOW (ref 39.0–52.0)
Hemoglobin: 11.9 g/dL — ABNORMAL LOW (ref 13.0–17.0)
O2 Saturation: 94 %
Patient temperature: 98.2
Potassium: 3.8 mmol/L (ref 3.5–5.1)
Sodium: 141 mmol/L (ref 135–145)
TCO2: 20 mmol/L — ABNORMAL LOW (ref 22–32)
pCO2 arterial: 33 mmHg (ref 32.0–48.0)
pH, Arterial: 7.364 (ref 7.350–7.450)
pO2, Arterial: 72 mmHg — ABNORMAL LOW (ref 83.0–108.0)

## 2019-03-23 LAB — BASIC METABOLIC PANEL
Anion gap: 14 (ref 5–15)
BUN: 17 mg/dL (ref 8–23)
CO2: 20 mmol/L — ABNORMAL LOW (ref 22–32)
Calcium: 9 mg/dL (ref 8.9–10.3)
Chloride: 105 mmol/L (ref 98–111)
Creatinine, Ser: 1.17 mg/dL (ref 0.61–1.24)
GFR calc Af Amer: >60 mL/min (ref >60)
GFR calc non Af Amer: 56 mL/min — ABNORMAL LOW (ref >60)
Glucose, Bld: 154 mg/dL — ABNORMAL HIGH (ref 70–99)
Potassium: 3.2 mmol/L — ABNORMAL LOW (ref 3.5–5.1)
Sodium: 139 mmol/L (ref 135–145)

## 2019-03-23 LAB — CBC WITH DIFFERENTIAL/PLATELET
Abs Immature Granulocytes: 0.07 10*3/uL (ref 0.00–0.07)
Basophils Absolute: 0.1 10*3/uL (ref 0.0–0.1)
Basophils Relative: 0 %
Eosinophils Absolute: 0.2 10*3/uL (ref 0.0–0.5)
Eosinophils Relative: 1 %
HCT: 41.1 % (ref 39.0–52.0)
Hemoglobin: 13.6 g/dL (ref 13.0–17.0)
Immature Granulocytes: 1 %
Lymphocytes Relative: 18 %
Lymphs Abs: 2.5 10*3/uL (ref 0.7–4.0)
MCH: 32.9 pg (ref 26.0–34.0)
MCHC: 33.1 g/dL (ref 30.0–36.0)
MCV: 99.3 fL (ref 80.0–100.0)
Monocytes Absolute: 1.2 10*3/uL — ABNORMAL HIGH (ref 0.1–1.0)
Monocytes Relative: 8 %
Neutro Abs: 10.3 10*3/uL — ABNORMAL HIGH (ref 1.7–7.7)
Neutrophils Relative %: 72 %
Platelets: 159 10*3/uL (ref 150–400)
RBC: 4.14 MIL/uL — ABNORMAL LOW (ref 4.22–5.81)
RDW: 13.2 % (ref 11.5–15.5)
WBC: 14.3 10*3/uL — ABNORMAL HIGH (ref 4.0–10.5)
nRBC: 0 % (ref 0.0–0.2)

## 2019-03-23 LAB — GLUCOSE, CAPILLARY
Glucose-Capillary: 105 mg/dL — ABNORMAL HIGH (ref 70–99)
Glucose-Capillary: 108 mg/dL — ABNORMAL HIGH (ref 70–99)
Glucose-Capillary: 121 mg/dL — ABNORMAL HIGH (ref 70–99)
Glucose-Capillary: 145 mg/dL — ABNORMAL HIGH (ref 70–99)
Glucose-Capillary: 158 mg/dL — ABNORMAL HIGH (ref 70–99)
Glucose-Capillary: 164 mg/dL — ABNORMAL HIGH (ref 70–99)

## 2019-03-23 LAB — LIPID PANEL
Cholesterol: 154 mg/dL (ref 0–200)
HDL: 36 mg/dL — ABNORMAL LOW (ref >40)
LDL Cholesterol: 80 mg/dL (ref 0–99)
Total CHOL/HDL Ratio: 4.3 RATIO
Triglycerides: 190 mg/dL — ABNORMAL HIGH (ref <150)
VLDL: 38 mg/dL (ref 0–40)

## 2019-03-23 LAB — MRSA PCR SCREENING: MRSA by PCR: NEGATIVE

## 2019-03-23 LAB — URINALYSIS, MICROSCOPIC (REFLEX)

## 2019-03-23 LAB — URINALYSIS, ROUTINE W REFLEX MICROSCOPIC
Bilirubin Urine: NEGATIVE
Glucose, UA: NEGATIVE mg/dL
Ketones, ur: NEGATIVE mg/dL
Nitrite: NEGATIVE
Protein, ur: NEGATIVE mg/dL
Specific Gravity, Urine: 1.015 (ref 1.005–1.030)
pH: 6 (ref 5.0–8.0)

## 2019-03-23 LAB — ECHOCARDIOGRAM COMPLETE
Height: 71 in
Weight: 3026.47 oz

## 2019-03-23 LAB — MAGNESIUM: Magnesium: 1.7 mg/dL (ref 1.7–2.4)

## 2019-03-23 LAB — TRIGLYCERIDES: Triglycerides: 186 mg/dL — ABNORMAL HIGH (ref <150)

## 2019-03-23 LAB — HEMOGLOBIN AND HEMATOCRIT, BLOOD
HCT: 38.5 % — ABNORMAL LOW (ref 39.0–52.0)
Hemoglobin: 12.6 g/dL — ABNORMAL LOW (ref 13.0–17.0)

## 2019-03-23 LAB — PROCALCITONIN: Procalcitonin: 0.2 ng/mL

## 2019-03-23 MED ORDER — LACTATED RINGERS IV BOLUS
500.0000 mL | Freq: Once | INTRAVENOUS | Status: AC
  Administered 2019-03-23: 17:00:00 500 mL via INTRAVENOUS

## 2019-03-23 MED ORDER — POTASSIUM CHLORIDE 20 MEQ PO PACK
40.0000 meq | PACK | ORAL | Status: AC
  Administered 2019-03-23: 40 meq
  Filled 2019-03-23: qty 2

## 2019-03-23 MED ORDER — FENTANYL CITRATE (PF) 100 MCG/2ML IJ SOLN: 25.0000 ug | INTRAMUSCULAR | Status: DC | PRN

## 2019-03-23 MED ORDER — SODIUM CHLORIDE 0.9 % IV BOLUS
500.0000 mL | Freq: Once | INTRAVENOUS | Status: AC
  Administered 2019-03-23: 11:00:00 500 mL via INTRAVENOUS

## 2019-03-23 MED ORDER — DONEPEZIL HCL 5 MG PO TABS
5.0000 mg | ORAL_TABLET | Freq: Every day | ORAL | Status: DC
  Administered 2019-03-23 – 2019-04-16 (×24): 5 mg
  Filled 2019-03-23 (×24): qty 1

## 2019-03-23 MED ORDER — DEXMEDETOMIDINE HCL IN NACL 400 MCG/100ML IV SOLN
0.4000 ug/kg/h | INTRAVENOUS | Status: DC
  Administered 2019-03-23: 17:00:00 0.4 ug/kg/h via INTRAVENOUS
  Administered 2019-03-23 – 2019-03-24 (×2): 0.6 ug/kg/h via INTRAVENOUS
  Administered 2019-03-24: 06:00:00 0.8 ug/kg/h via INTRAVENOUS
  Administered 2019-03-24: 13:00:00 0.9 ug/kg/h via INTRAVENOUS
  Filled 2019-03-23 (×5): qty 100

## 2019-03-23 MED ORDER — MIDAZOLAM HCL 2 MG/2ML IJ SOLN
2.0000 mg | Freq: Once | INTRAMUSCULAR | Status: AC
  Administered 2019-03-23: 13:00:00 2 mg via INTRAVENOUS

## 2019-03-23 MED ORDER — FENTANYL 2500MCG IN NS 250ML (10MCG/ML) PREMIX INFUSION
25.0000 ug/h | INTRAVENOUS | Status: DC
  Administered 2019-03-23: 25 ug/h via INTRAVENOUS
  Administered 2019-03-24: 175 ug/h via INTRAVENOUS
  Administered 2019-03-24: 200 ug/h via INTRAVENOUS
  Administered 2019-03-26: 75 ug/h via INTRAVENOUS
  Filled 2019-03-23 (×4): qty 250

## 2019-03-23 MED ORDER — MIDAZOLAM HCL 2 MG/2ML IJ SOLN
INTRAMUSCULAR | Status: AC
  Filled 2019-03-23: qty 2

## 2019-03-23 MED ORDER — MAGNESIUM SULFATE 2 GM/50ML IV SOLN
2.0000 g | Freq: Once | INTRAVENOUS | Status: AC
  Administered 2019-03-23: 12:00:00 2 g via INTRAVENOUS
  Filled 2019-03-23: qty 50

## 2019-03-23 MED ORDER — ENOXAPARIN SODIUM 40 MG/0.4ML ~~LOC~~ SOLN
40.0000 mg | SUBCUTANEOUS | Status: DC
  Administered 2019-03-23: 22:00:00 40 mg via SUBCUTANEOUS
  Filled 2019-03-23: qty 0.4

## 2019-03-23 MED ORDER — BISACODYL 10 MG RE SUPP
10.0000 mg | Freq: Every day | RECTAL | Status: DC | PRN
  Filled 2019-03-23: qty 1

## 2019-03-23 MED ORDER — ATORVASTATIN CALCIUM 40 MG PO TABS
40.0000 mg | ORAL_TABLET | Freq: Every day | ORAL | Status: DC
  Filled 2019-03-23: qty 1

## 2019-03-23 MED ORDER — CHLORHEXIDINE GLUCONATE CLOTH 2 % EX PADS
6.0000 | MEDICATED_PAD | Freq: Every day | CUTANEOUS | Status: DC
  Administered 2019-03-25 – 2019-04-16 (×21): 6 via TOPICAL

## 2019-03-23 MED ORDER — POTASSIUM CHLORIDE 20 MEQ/15ML (10%) PO SOLN
40.0000 meq | Freq: Once | ORAL | Status: AC
  Administered 2019-03-23: 14:00:00 40 meq
  Filled 2019-03-23: qty 30

## 2019-03-23 MED ORDER — FENTANYL CITRATE (PF) 100 MCG/2ML IJ SOLN
25.0000 ug | INTRAMUSCULAR | Status: DC | PRN
  Administered 2019-03-23: 100 ug via INTRAVENOUS
  Filled 2019-03-23: qty 2

## 2019-03-23 NOTE — Progress Notes (Signed)
PT Cancellation Note  Patient Details Name: Jonathan Berger MRN: [id number] DOB: 04/25/1931   Cancelled Treatment:    Reason Eval/Treat Not Completed: Medical issues which prohibited therapy; patient on bedrest after sheath removal.  Will attempt to see another day.    [name] 03/23/2019, 1:01 PM  Sheran Lawless, PT Acute Rehabilitation Services [phone] 03/23/2019

## 2019-03-23 NOTE — Progress Notes (Signed)
  Echocardiogram 2D Echocardiogram has been performed.  Jonathan Berger 03/23/2019, 9:14 AM

## 2019-03-23 NOTE — Progress Notes (Signed)
eLink Physician-Brief Progress Note Patient Name: Jonathan Berger DOB: 12/27/1931 MRN: [id number]   Date of Service  03/23/2019  HPI/Events of Note  Frequent PVCs in the setting of K 3.2   eICU Interventions  Will give 40 mEq potassium chloride per tube.  Will add on a magnesium level to morning labs.     Intervention Category Major Interventions: Arrhythmia - evaluation and management  Marveen Reeks [name] 03/23/2019, 5:14 AM

## 2019-03-23 NOTE — Progress Notes (Signed)
Referring Physician(s): Dr. Otelia Limes  Supervising Physician: Julieanne Cotton  Patient Status:  [facility] - In-pt  Chief Complaint: R MCA CVA  Subjective: Admitted 2/7 with R MCA CVA, given tPA then underwent revascularization with Dr. Corliss Skains overnight.  Remains intubated this AM due to sheath left in place.  Requiring sedation medication due agitation.  Moves all extremities.   Allergies: Patient has no known allergies.  Medications: Prior to Admission medications   Medication Sig Start Date End Date Taking? Authorizing Provider  amLODipine (NORVASC) 5 MG tablet Take 5 mg by mouth daily. 01/29/19  Yes [provider]  donepezil (ARICEPT) 5 MG tablet Take 5 mg by mouth at bedtime. 03/05/19  Yes [provider]  Ensure (ENSURE) Take 237 mLs by mouth 3 (three) times daily between meals.   Yes [provider]  memantine (NAMENDA) 5 MG tablet Take 5 mg by mouth 2 (two) times daily. 02/04/19  Yes [provider]  triamcinolone cream (KENALOG) 0.1 % Apply 1 application topically at bedtime. Apply to rash on back of right leg below knee   Yes [provider]  valsartan-hydrochlorothiazide (DIOVAN-HCT) 160-12.5 MG tablet Take 1 tablet by mouth daily. 01/29/19  Yes [provider]  amLODipine-valsartan (EXFORGE) 5-320 MG per tablet Take 1 tablet by mouth daily.    04/27/11  [provider]     Vital Signs: BP 125/73   Pulse (!) 58   Temp 98.2 F (36.8 C) (Axillary)   Resp 18   Ht 5\' 11"  (1.803 m)   Wt 189 lb 2.5 oz (85.8 kg)   SpO2 99%   BMI 26.38 kg/m   Physical Exam  NAD, alert Neuro: sedated, exam deferred Groin: some oozing, dried blood visible through gauze, does not appear to be actively bleeding, pressure dressing left in place.  Imaging: CT Code Stroke CTA Head W/WO contrast  Result Date: 04/05/2019 CLINICAL DATA:  86 year old male code stroke presentation with hyperdense right MCA on plain head CT.  EXAM: CT ANGIOGRAPHY HEAD AND NECK TECHNIQUE: Multidetector CT imaging of the head and neck was performed using the standard protocol during bolus administration of intravenous contrast. Multiplanar CT image reconstructions and MIPs were obtained to evaluate the vascular anatomy. Carotid stenosis measurements (when applicable) are obtained utilizing NASCET criteria, using the distal internal carotid diameter as the denominator. CONTRAST:  72mL OMNIPAQUE IOHEXOL 350 MG/ML SOLN COMPARISON:  Plain head CT 1306 hours today. FINDINGS: CTA NECK Skeleton: Absent maxillary dentition. Advanced degenerative changes in the cervical spine. No acute osseous abnormality identified. Upper chest: Mild upper lung atelectasis. No superior mediastinal lymphadenopathy. Other neck: Negative, no acute findings in the neck. Aortic arch: 3 vessel arch configuration. Mild to moderate arch atherosclerosis, with mostly soft plaque in the distal arch. Right carotid system: No brachiocephalic or right CCA origin stenosis despite some plaque. No stenosis proximal to the bifurcation, but there is bulky soft plaque or thrombus throughout the right ICA origin and bulb resulting in critical radiographic string sign stenosis on series 9, image 51. Superimposed additional bulky calcified plaque distal to the bulb also resulting in tandem string sign stenoses (series 5, image 105 and series 9 image 50). Despite this the vessel remains patent although is asymmetrically smaller to the skull base (about 3 mm diameter). Left carotid system: No left CCA origin stenosis despite plaque. At the left carotid bifurcation the left ICA origin is patent, but at the distal bulb there is bulky calcified more so than soft plaque resulting  in severe stenosis at or approaching a radiographic string sign on series 5, image 103. The vessel remains patent and has a fairly normal caliber distal to this region to the skull base. Vertebral arteries: Calcified plaque in the  proximal right subclavian artery with less than 50 % stenosis with respect to the distal vessel. Minor calcified plaque at the right vertebral artery origin without stenosis. However, there is bulky soft plaque in the right V1 segment resulting in moderate to severe stenosis on series 7, image 272. Distal to this the right vertebral caliber remains normal, and the vessel is then patent to the skull base without additional stenosis. Soft and calcified plaque plus tortuosity of the proximal left subclavian artery resulting in a kinked appearance and moderate stenosis as seen on series 9, image 109. Superimposed severe stenosis at the left vertebral artery origin due to soft plaque on series 8, image 133. The left vertebral is fairly codominant and then patent to the skull base without additional stenosis. CTA HEAD Posterior circulation: Non dominant left vertebral artery with mild calcified plaque does remain patent to the vertebrobasilar junction without stenosis. Moderate right V4 segment calcification with mild stenosis. Patent vertebrobasilar junction and basilar artery without stenosis. SCA and PCA origins are patent. Posterior communicating arteries are diminutive or absent. There is mild bilateral PCA irregularity and stenosis. Anterior circulation: Both ICA siphons are patent. On the left there is mild to moderate calcified plaque without stenosis. Normal left ophthalmic artery origin. On the right there is mild calcified plaque without stenosis. Normal right ophthalmic artery origin. Patent carotid termini. Patent bilateral MCA and left ACA origins. The left A1 is dominant and the right diminutive or absent. Anterior communicating artery and bilateral ACA branches are within normal limits. Left MCA M1 segment and bifurcation are patent without stenosis. Right MCA proximal M1 segment is patent but becomes near occluded more distally corresponding to the hyperdense segment by plain CT. The mid and distal right  M1 segment is affected (series 11, image 20 and series 10, image 19) with intermediate to poor reconstitution of right M2 and M3 branches (series 12, image 9). Venous sinuses: Early contrast timing, grossly patent. Anatomic variants: Mildly dominant right vertebral artery. Review of the MIP images confirms the above findings IMPRESSION: 1. Positive for Right MCA Emergent Large Vessel Occlusion affecting the mid and distal M1 segment. 2. Positive also for superimposed Critical Stenosis cervical Right ICA beginning at the origin, throughout the proximal 3.5 cm of the vessel. There are tandem RADIOGRAPHIC STRING SIGN STENOSES. 3. The above was discussed by telephone with to Dr. Otelia Limes at 1338 hours on 2019-03-24. 4. Additional severe atherosclerosis and multifocal high-grade arterial stenoses elsewhere: - Left ICA distal bulb RADIOGRAPHIC STRING SIGN STENOSIS. - Severe bilateral Vertebral Artery origin stenoses. - proximal Left Subclavian Artery moderate stenosis due to plaque and tortuosity. 5.  Aortic Atherosclerosis (ICD10-I70.0). Electronically Signed   By: Odessa Fleming M.D.   On: 03/24/2019 14:16   CT Code Stroke CTA Neck W/WO contrast  Result Date: 03/24/2019 CLINICAL DATA:  86 year old male code stroke presentation with hyperdense right MCA on plain head CT. EXAM: CT ANGIOGRAPHY HEAD AND NECK TECHNIQUE: Multidetector CT imaging of the head and neck was performed using the standard protocol during bolus administration of intravenous contrast. Multiplanar CT image reconstructions and MIPs were obtained to evaluate the vascular anatomy. Carotid stenosis measurements (when applicable) are obtained utilizing NASCET criteria, using the distal internal carotid diameter as the denominator. CONTRAST:  44mL  OMNIPAQUE IOHEXOL 350 MG/ML SOLN COMPARISON:  Plain head CT 1306 hours today. FINDINGS: CTA NECK Skeleton: Absent maxillary dentition. Advanced degenerative changes in the cervical spine. No acute osseous abnormality  identified. Upper chest: Mild upper lung atelectasis. No superior mediastinal lymphadenopathy. Other neck: Negative, no acute findings in the neck. Aortic arch: 3 vessel arch configuration. Mild to moderate arch atherosclerosis, with mostly soft plaque in the distal arch. Right carotid system: No brachiocephalic or right CCA origin stenosis despite some plaque. No stenosis proximal to the bifurcation, but there is bulky soft plaque or thrombus throughout the right ICA origin and bulb resulting in critical radiographic string sign stenosis on series 9, image 51. Superimposed additional bulky calcified plaque distal to the bulb also resulting in tandem string sign stenoses (series 5, image 105 and series 9 image 50). Despite this the vessel remains patent although is asymmetrically smaller to the skull base (about 3 mm diameter). Left carotid system: No left CCA origin stenosis despite plaque. At the left carotid bifurcation the left ICA origin is patent, but at the distal bulb there is bulky calcified more so than soft plaque resulting in severe stenosis at or approaching a radiographic string sign on series 5, image 103. The vessel remains patent and has a fairly normal caliber distal to this region to the skull base. Vertebral arteries: Calcified plaque in the proximal right subclavian artery with less than 50 % stenosis with respect to the distal vessel. Minor calcified plaque at the right vertebral artery origin without stenosis. However, there is bulky soft plaque in the right V1 segment resulting in moderate to severe stenosis on series 7, image 272. Distal to this the right vertebral caliber remains normal, and the vessel is then patent to the skull base without additional stenosis. Soft and calcified plaque plus tortuosity of the proximal left subclavian artery resulting in a kinked appearance and moderate stenosis as seen on series 9, image 109. Superimposed severe stenosis at the left vertebral artery  origin due to soft plaque on series 8, image 133. The left vertebral is fairly codominant and then patent to the skull base without additional stenosis. CTA HEAD Posterior circulation: Non dominant left vertebral artery with mild calcified plaque does remain patent to the vertebrobasilar junction without stenosis. Moderate right V4 segment calcification with mild stenosis. Patent vertebrobasilar junction and basilar artery without stenosis. SCA and PCA origins are patent. Posterior communicating arteries are diminutive or absent. There is mild bilateral PCA irregularity and stenosis. Anterior circulation: Both ICA siphons are patent. On the left there is mild to moderate calcified plaque without stenosis. Normal left ophthalmic artery origin. On the right there is mild calcified plaque without stenosis. Normal right ophthalmic artery origin. Patent carotid termini. Patent bilateral MCA and left ACA origins. The left A1 is dominant and the right diminutive or absent. Anterior communicating artery and bilateral ACA branches are within normal limits. Left MCA M1 segment and bifurcation are patent without stenosis. Right MCA proximal M1 segment is patent but becomes near occluded more distally corresponding to the hyperdense segment by plain CT. The mid and distal right M1 segment is affected (series 11, image 20 and series 10, image 19) with intermediate to poor reconstitution of right M2 and M3 branches (series 12, image 9). Venous sinuses: Early contrast timing, grossly patent. Anatomic variants: Mildly dominant right vertebral artery. Review of the MIP images confirms the above findings IMPRESSION: 1. Positive for Right MCA Emergent Large Vessel Occlusion affecting the mid and distal  M1 segment. 2. Positive also for superimposed Critical Stenosis cervical Right ICA beginning at the origin, throughout the proximal 3.5 cm of the vessel. There are tandem RADIOGRAPHIC STRING SIGN STENOSES. 3. The above was discussed by  telephone with to Dr. Otelia Limes at 1338 hours on 04/09/2019. 4. Additional severe atherosclerosis and multifocal high-grade arterial stenoses elsewhere: - Left ICA distal bulb RADIOGRAPHIC STRING SIGN STENOSIS. - Severe bilateral Vertebral Artery origin stenoses. - proximal Left Subclavian Artery moderate stenosis due to plaque and tortuosity. 5.  Aortic Atherosclerosis (ICD10-I70.0). Electronically Signed   By: Odessa Fleming M.D.   On: Apr 09, 2019 14:16   DG Chest Port 1 View  Result Date: 2019/04/09 CLINICAL DATA:  Respiratory failure. EXAM: PORTABLE CHEST 1 VIEW COMPARISON:  October 09, 2013 FINDINGS: An endotracheal tube is seen with its distal tip approximately 3.7 cm from the carina. A nasogastric tube is noted with its distal end overlying the body of the stomach. Very mild atelectasis and/or infiltrate is seen along the medial aspect of the left lung base. There is no evidence of a pleural effusion or pneumothorax. There is moderate to marked severity enlargement of the cardiac silhouette. The visualized skeletal structures are unremarkable. IMPRESSION: 1. Endotracheal tube and nasogastric tube in good position. 2. Moderate to marked severity enlargement of the cardiac silhouette. 3. Mild left basilar atelectasis and/or infiltrate. Electronically Signed   By: Aram Candela M.D.   On: Apr 09, 2019 19:05   CT HEAD CODE STROKE WO CONTRAST  Result Date: April 09, 2019 CLINICAL DATA:  Code stroke. 86 year old male with left facial droop and aphasia. EXAM: CT HEAD WITHOUT CONTRAST TECHNIQUE: Contiguous axial images were obtained from the base of the skull through the vertex without intravenous contrast. COMPARISON:  Head CT 12/25/2016. FINDINGS: Brain: Cerebral volume is not significantly changed since 2018. No midline shift, mass effect, or evidence of intracranial mass lesion. No ventriculomegaly. No acute intracranial hemorrhage identified. Scattered bilateral cerebral white matter hypodensity has increased. The deep  gray nuclei, brainstem and cerebellum remain within normal limits. No cortically based acute infarct identified. Vascular: Calcified atherosclerosis at the skull base. There is asymmetric right MCA M1 or proximal M2 hyperdensity best seen on series 5, image 29 which is new from 2018. Skull: Negative. Sinuses/Orbits: Visualized paranasal sinuses and mastoids are stable and well pneumatized. Other: Negative orbit and scalp soft tissues. ASPECTS [facility] Stroke Program Early CT Score) Total score (0-10 with 10 being normal): 10 IMPRESSION: 1. Hyperdense Right MCA suspicious for emergent large vessel occlusion. But no CT changes of acute cortically based infarct identified. ASPECTS 10. 2. No acute intracranial hemorrhage. Increased scattered white matter hypodensity since 2018. 3. These results were communicated to Dr. Otelia Limes at 1:14 pmon February 25, 2021by text page via the [facility] messaging system. Electronically Signed   By: Odessa Fleming M.D.   On: 2019/04/09 13:16    Labs:  CBC: Recent Labs    2019/04/09 1303 04/09/19 1308 03/23/19 0303  WBC 9.5  --  14.3*  HGB 16.1 16.0 13.6  HCT 48.0 47.0 41.1  PLT 145*  --  159    COAGS: Recent Labs    04-09-19 1303  INR 0.9  APTT 28    BMP: Recent Labs    04/09/19 1303 April 09, 2019 1308 03/23/19 0303  NA 136 137 139  K 3.6 3.6 3.2*  CL 102 102 105  CO2 25  --  20*  GLUCOSE 160* 155* 154*  BUN 22 24* 17  CALCIUM 10.3  --  9.0  CREATININE 1.36*  1.30* 1.17  GFRNONAA 47*  --  56*  GFRAA 54*  --  >60    LIVER FUNCTION TESTS: Recent Labs    04/07/2019 1303  BILITOT 0.8  AST 19  ALT 15  ALKPHOS 54  PROT 6.6  ALBUMIN 3.5    Assessment and Plan: R MCA CVA s/p revascularization of R MCA occlusion followed by balloon-assisted angioplasty with stent placement in proximal R ICA for pre-occlusive stenosis Patient remains intubated this AM.   For sheath removal this AM-- left in place due to tPA administration prior to intervention.  Currently on Aggrastat.   To discontinue once aspirin and brilinta started.  Distal pulses palpable.  Groin with some oozing overnight.  Will follow after sheath removal.  Neuro exam deferred due to sedation medication.   Electronically Signed: Docia Barrier, PA 03/23/2019, 10:44 AM   I spent a total of 15 Minutes at the the patient's bedside AND on the patient's hospital floor or unit, greater than 50% of which was counseling/coordinating care for R MCA CVA.

## 2019-03-23 NOTE — Progress Notes (Signed)
Patient transported to MRI and back without complications. RN at bedside. 

## 2019-03-23 NOTE — Progress Notes (Signed)
Pt was identified by name and d.o.b. 8Fr sheath was removed from right groin at 1127 hrs. Quik clot dressing was applied and manual pressure was held for 15 minutes. Hemostasis was achieved at 1142 hrs. Gauze/tegaderm was applied to site. Distal pulses were palpable. Hemostasis/Distal pulses were verified by Carylon Perches RTR, Mathis Dad RTR, and [employer] . We advised RN to remove quik clot dressing after 24 hrs.

## 2019-03-23 NOTE — Progress Notes (Signed)
SLP Cancellation Note  Patient Details Name: Jonathan Berger MRN: [id number] DOB: [birth year]   Cancelled treatment:       Reason Eval/Treat Not Completed: Medical issues which prohibited therapy; pt still intubated s/p procedure.  Will follow for readiness.  [name] L. [name], MA CCC/SLP [facility] Office number [phone]      [name] 03/23/2019, 9:32 AM

## 2019-03-23 NOTE — Progress Notes (Signed)
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   SIGNIFICANT PAST MEDICAL HISTORY   Hypertension  SIGNIFICANT EVENTS:  2019/04/13 right MCA stroke  STUDIES:   2/7 CTH >> 1. Hyperdense Right MCA suspicious for emergent large vessel occlusion. But no CT changes of acute cortically based infarct identified. ASPECTS 10. 2. No acute intracranial hemorrhage. Increased scattered white matter hypodensity since 2018.  2/7 CTA head/ neck>> 1. Positive for Right MCA Emergent Large Vessel Occlusion affecting the mid and distal M1 segment. 2. Positive also for superimposed Critical Stenosis cervical Right ICA beginning at the origin, throughout the proximal 3.5 cm of the vessel. There are tandem RADIOGRAPHIC STRING SIGN STENOSES. 3. The above was discussed by telephone with to Dr. Otelia Limes at 1338 hours on Apr 13, 2019. 4. Additional severe atherosclerosis and multifocal high-grade arterial stenoses elsewhere: - Left ICA distal bulb RADIOGRAPHIC STRING SIGN STENOSIS. - Severe bilateral Vertebral Artery origin stenoses. - proximal Left Subclavian Artery moderate stenosis due to plaque and tortuosity. 5.  Aortic Atherosclerosis   2/8 TTE >>  CULTURES:  2/7 SARS2/ Flu A/B >> neg  ANTIBIOTICS:  2/7 cefazolin pre op  LINES/TUBES:  2019-04-13 endotracheal tube 04/13/2019 left radial A-line 2019-04-13 right femoral sheath  CONSULTANTS:  13-Apr-2019 pulmonary critical care   SUBJECTIVE:  Afebrile   Remains on levophed to maintain SBP goals Sedated but easily agitated  on fentanyl and propofol gtt Pending right sheath removal then going for MRI brain                      CONSTITUTIONAL: BP 112/66   Pulse (!) 55   Temp 98.2 F (36.8 C) (Axillary)   Resp 18   Ht 5\' 11"  (1.803 m)   Wt 85.8 kg   SpO2 99%   BMI 26.38 kg/m   I/O last 3 completed shifts: In: 2630.5 [I.V.:2580.5; IV Piggyback:50] Out: 1850 [Urine:1750; Blood:100]     Vent Mode: PRVC FiO2 (%):  [40 %] 40 % Set Rate:  [15 bmp-18 bmp] 18 bmp Vt Set:  [600 mL] 600 mL PEEP:  [5 cmH20] 5 cmH20 Plateau Pressure:  [13 cmH20-19 cmH20] 19 cmH20  PHYSICAL EXAM:  Sedated on fentanyl gtt 200 mcg/hr and propofol 20 mcg/kg/min General:  Elderly male in NAD on MV HEENT: MM pink/moist, ETT at 24, OGT, pupils 3/sluggish Neuro: sedated, easily agitated- attempts to sit up, MAE-very strong, not open eyes or f/c CV: rr ir, no murmur- distant, right groin site with bulky dressing, +2 dp PULM:  Non labored on MV, CTA- slightly diminished right base, no significant secretions GI: soft, hypoBS, NT, foley  Extremities: warm/dry, no LE edema  Skin: no rashes  RESOLVED PROBLEM LIST   ASSESSMENT AND PLAN    Respiratory insufficiency in the post op setting -2/7 CXR with mild left basilar atelectasis vs infiltrate  P:  Full MV support for now Post sheath removal and MRI, can hopefull wean/ extubate Checking ABG Prn CXR VAP  PAD with propofol / fentanyl for RASS goal -1/-2 Daily SBT/ WUA Will need SLP post extubation    R MCA infarct s/p tPA and IR with L ICA stent placement P:  Per Neurology  SBP goal 120-160 Pending sheath removal this am, then will need 24 hr imaging/ MRI brain Serial neuro exams Soft c-collar ASA / brillinta per Dr. Estanislado Pandy  TTE pending  Further stroke workup per neuro    Hypotension  - likely related to sedation, remains afebrile - no obvious  infectious etiology  Hx HTN  P:  Levophed PIV to support SBP goal 120-160 NS bolus 500 x 1 Check UA Trend CXR- monitor left basilar atelectasis  Wean sedation as able  Trend I/Os  Holding home meds while hypotensive   Hypokalemia - s/p KCL 40 meq this am  P:  KCL 40 meq this afternoon Mag 2 gm now (Mag 1.7, goal > 2) BMP/ mag in am    Leukocytosis  P:  Likely reactive Check UA Trend fever curve/ WBC Monitor clinically for now Pan culture if develops fever    Best Practice / Goals of Care / Disposition.   DVT PROPHYLAXIS: SCDS only (s/p tPA) SUP: PPI NUTRITION: NPO MOBILITY: Bedrest GOALS OF CARE: Full code FAMILY DISCUSSIONS:  pending DISPOSITION: ICU   LABS  Glucose Recent Labs  Lab 03/28/2019 1302 03/23/2019 1353 03/24/2019 1943 03/23/19 0011 03/23/19 0834  GLUCAP 151* 149* 128* 158* 121*    BMET Recent Labs  Lab 04/05/2019 1303 03/17/2019 1308 03/23/19 0303  NA 136 137 139  K 3.6 3.6 3.2*  CL 102 102 105  CO2 25  --  20*  BUN 22 24* 17  CREATININE 1.36* 1.30* 1.17  GLUCOSE 160* 155* 154*    Liver Enzymes Recent Labs  Lab 04/04/2019 1303  AST 19  ALT 15  ALKPHOS 54  BILITOT 0.8  ALBUMIN 3.5    Electrolytes Recent Labs  Lab 03/23/2019 1303 03/23/19 0303  CALCIUM 10.3 9.0  MG  --  1.7    CBC Recent Labs  Lab 03/29/2019 1303 03/28/2019 1308 03/23/19 0303  WBC 9.5  --  14.3*  HGB 16.1 16.0 13.6  HCT 48.0 47.0 41.1  PLT 145*  --  159    ABG No results for input(s): PHART, PCO2ART, PO2ART in the last 168 hours.  Coag's Recent Labs  Lab 04/10/2019 1303  APTT 28  INR 0.9    CCT 35    Kennieth Rad, MSN, AGACNP-BC [facility] 03/23/2019, 10:37 AM

## 2019-03-23 NOTE — Progress Notes (Signed)
STROKE TEAM PROGRESS NOTE   INTERVAL HISTORY RN at bedside. Pt still intubated on sedation. Not open eyes on pain, but able to move all extremities on pain stimulation, L>R. Still has right groin sheath in and pending MRI. On ASA and brilinta, will stop aggrestat once po meds given this am as per Dr. Corliss Skains.  Vitals:   03/23/19 1530 03/23/19 1545 03/23/19 1600 03/23/19 1615  BP: 140/63  (!) 134/59   Pulse: 76 (!) 54 65 69  Resp: 18 18 18 18   Temp:   99.2 F (37.3 C)   TempSrc:   Axillary   SpO2: 96% 97% 97% 92%  Weight:      Height:        CBC:  Recent Labs  Lab March 27, 2019 1303 03/27/2019 1308 03/23/19 0303 03/23/19 1640  WBC 9.5  --  14.3*  --   NEUTROABS 5.0  --  10.3*  --   HGB 16.1   < > 13.6 11.9*  HCT 48.0   < > 41.1 35.0*  MCV 97.8  --  99.3  --   PLT 145*  --  159  --    < > = values in this interval not displayed.    Basic Metabolic Panel:  Recent Labs  Lab 2019-03-27 1303 03/27/2019 1303 March 27, 2019 1308 2019-03-27 1308 03/23/19 0303 03/23/19 1640  NA 136   < > 137   < > 139 141  K 3.6   < > 3.6   < > 3.2* 3.8  CL 102   < > 102  --  105  --   CO2 25  --   --   --  20*  --   GLUCOSE 160*   < > 155*  --  154*  --   BUN 22   < > 24*  --  17  --   CREATININE 1.36*   < > 1.30*  --  1.17  --   CALCIUM 10.3  --   --   --  9.0  --   MG  --   --   --   --  1.7  --    < > = values in this interval not displayed.   Lipid Panel:     Component Value Date/Time   CHOL 154 03/23/2019 0303   TRIG 190 (H) 03/23/2019 0303   TRIG 186 (H) 03/23/2019 0303   HDL 36 (L) 03/23/2019 0303   CHOLHDL 4.3 03/23/2019 0303   VLDL 38 03/23/2019 0303   LDLCALC 80 03/23/2019 0303   HgbA1c: No results found for: HGBA1C Urine Drug Screen: No results found for: LABOPIA, COCAINSCRNUR, LABBENZ, AMPHETMU, THCU, LABBARB  Alcohol Level No results found for: ETH  IMAGING past 48 hours CT Code Stroke CTA Head W/WO contrast  Result Date: 2019/03/27 CLINICAL DATA:  86 year old male code  stroke presentation with hyperdense right MCA on plain head CT. EXAM: CT ANGIOGRAPHY HEAD AND NECK TECHNIQUE: Multidetector CT imaging of the head and neck was performed using the standard protocol during bolus administration of intravenous contrast. Multiplanar CT image reconstructions and MIPs were obtained to evaluate the vascular anatomy. Carotid stenosis measurements (when applicable) are obtained utilizing NASCET criteria, using the distal internal carotid diameter as the denominator. CONTRAST:  58mL OMNIPAQUE IOHEXOL 350 MG/ML SOLN COMPARISON:  Plain head CT 1306 hours today. FINDINGS: CTA NECK Skeleton: Absent maxillary dentition. Advanced degenerative changes in the cervical spine. No acute osseous abnormality identified. Upper chest: Mild upper lung atelectasis. No superior  mediastinal lymphadenopathy. Other neck: Negative, no acute findings in the neck. Aortic arch: 3 vessel arch configuration. Mild to moderate arch atherosclerosis, with mostly soft plaque in the distal arch. Right carotid system: No brachiocephalic or right CCA origin stenosis despite some plaque. No stenosis proximal to the bifurcation, but there is bulky soft plaque or thrombus throughout the right ICA origin and bulb resulting in critical radiographic string sign stenosis on series 9, image 51. Superimposed additional bulky calcified plaque distal to the bulb also resulting in tandem string sign stenoses (series 5, image 105 and series 9 image 50). Despite this the vessel remains patent although is asymmetrically smaller to the skull base (about 3 mm diameter). Left carotid system: No left CCA origin stenosis despite plaque. At the left carotid bifurcation the left ICA origin is patent, but at the distal bulb there is bulky calcified more so than soft plaque resulting in severe stenosis at or approaching a radiographic string sign on series 5, image 103. The vessel remains patent and has a fairly normal caliber distal to this region  to the skull base. Vertebral arteries: Calcified plaque in the proximal right subclavian artery with less than 50 % stenosis with respect to the distal vessel. Minor calcified plaque at the right vertebral artery origin without stenosis. However, there is bulky soft plaque in the right V1 segment resulting in moderate to severe stenosis on series 7, image 272. Distal to this the right vertebral caliber remains normal, and the vessel is then patent to the skull base without additional stenosis. Soft and calcified plaque plus tortuosity of the proximal left subclavian artery resulting in a kinked appearance and moderate stenosis as seen on series 9, image 109. Superimposed severe stenosis at the left vertebral artery origin due to soft plaque on series 8, image 133. The left vertebral is fairly codominant and then patent to the skull base without additional stenosis. CTA HEAD Posterior circulation: Non dominant left vertebral artery with mild calcified plaque does remain patent to the vertebrobasilar junction without stenosis. Moderate right V4 segment calcification with mild stenosis. Patent vertebrobasilar junction and basilar artery without stenosis. SCA and PCA origins are patent. Posterior communicating arteries are diminutive or absent. There is mild bilateral PCA irregularity and stenosis. Anterior circulation: Both ICA siphons are patent. On the left there is mild to moderate calcified plaque without stenosis. Normal left ophthalmic artery origin. On the right there is mild calcified plaque without stenosis. Normal right ophthalmic artery origin. Patent carotid termini. Patent bilateral MCA and left ACA origins. The left A1 is dominant and the right diminutive or absent. Anterior communicating artery and bilateral ACA branches are within normal limits. Left MCA M1 segment and bifurcation are patent without stenosis. Right MCA proximal M1 segment is patent but becomes near occluded more distally corresponding  to the hyperdense segment by plain CT. The mid and distal right M1 segment is affected (series 11, image 20 and series 10, image 19) with intermediate to poor reconstitution of right M2 and M3 branches (series 12, image 9). Venous sinuses: Early contrast timing, grossly patent. Anatomic variants: Mildly dominant right vertebral artery. Review of the MIP images confirms the above findings IMPRESSION: 1. Positive for Right MCA Emergent Large Vessel Occlusion affecting the mid and distal M1 segment. 2. Positive also for superimposed Critical Stenosis cervical Right ICA beginning at the origin, throughout the proximal 3.5 cm of the vessel. There are tandem RADIOGRAPHIC STRING SIGN STENOSES. 3. The above was discussed by telephone with to  Dr. Otelia Limes at 1338 hours on 04-07-2019. 4. Additional severe atherosclerosis and multifocal high-grade arterial stenoses elsewhere: - Left ICA distal bulb RADIOGRAPHIC STRING SIGN STENOSIS. - Severe bilateral Vertebral Artery origin stenoses. - proximal Left Subclavian Artery moderate stenosis due to plaque and tortuosity. 5.  Aortic Atherosclerosis (ICD10-I70.0). Electronically Signed   By: Odessa Fleming M.D.   On: 04/07/19 14:16   CT Code Stroke CTA Neck W/WO contrast  Result Date: Apr 07, 2019 CLINICAL DATA:  86 year old male code stroke presentation with hyperdense right MCA on plain head CT. EXAM: CT ANGIOGRAPHY HEAD AND NECK TECHNIQUE: Multidetector CT imaging of the head and neck was performed using the standard protocol during bolus administration of intravenous contrast. Multiplanar CT image reconstructions and MIPs were obtained to evaluate the vascular anatomy. Carotid stenosis measurements (when applicable) are obtained utilizing NASCET criteria, using the distal internal carotid diameter as the denominator. CONTRAST:  50mL OMNIPAQUE IOHEXOL 350 MG/ML SOLN COMPARISON:  Plain head CT 1306 hours today. FINDINGS: CTA NECK Skeleton: Absent maxillary dentition. Advanced degenerative  changes in the cervical spine. No acute osseous abnormality identified. Upper chest: Mild upper lung atelectasis. No superior mediastinal lymphadenopathy. Other neck: Negative, no acute findings in the neck. Aortic arch: 3 vessel arch configuration. Mild to moderate arch atherosclerosis, with mostly soft plaque in the distal arch. Right carotid system: No brachiocephalic or right CCA origin stenosis despite some plaque. No stenosis proximal to the bifurcation, but there is bulky soft plaque or thrombus throughout the right ICA origin and bulb resulting in critical radiographic string sign stenosis on series 9, image 51. Superimposed additional bulky calcified plaque distal to the bulb also resulting in tandem string sign stenoses (series 5, image 105 and series 9 image 50). Despite this the vessel remains patent although is asymmetrically smaller to the skull base (about 3 mm diameter). Left carotid system: No left CCA origin stenosis despite plaque. At the left carotid bifurcation the left ICA origin is patent, but at the distal bulb there is bulky calcified more so than soft plaque resulting in severe stenosis at or approaching a radiographic string sign on series 5, image 103. The vessel remains patent and has a fairly normal caliber distal to this region to the skull base. Vertebral arteries: Calcified plaque in the proximal right subclavian artery with less than 50 % stenosis with respect to the distal vessel. Minor calcified plaque at the right vertebral artery origin without stenosis. However, there is bulky soft plaque in the right V1 segment resulting in moderate to severe stenosis on series 7, image 272. Distal to this the right vertebral caliber remains normal, and the vessel is then patent to the skull base without additional stenosis. Soft and calcified plaque plus tortuosity of the proximal left subclavian artery resulting in a kinked appearance and moderate stenosis as seen on series 9, image 109.  Superimposed severe stenosis at the left vertebral artery origin due to soft plaque on series 8, image 133. The left vertebral is fairly codominant and then patent to the skull base without additional stenosis. CTA HEAD Posterior circulation: Non dominant left vertebral artery with mild calcified plaque does remain patent to the vertebrobasilar junction without stenosis. Moderate right V4 segment calcification with mild stenosis. Patent vertebrobasilar junction and basilar artery without stenosis. SCA and PCA origins are patent. Posterior communicating arteries are diminutive or absent. There is mild bilateral PCA irregularity and stenosis. Anterior circulation: Both ICA siphons are patent. On the left there is mild to moderate calcified plaque without  stenosis. Normal left ophthalmic artery origin. On the right there is mild calcified plaque without stenosis. Normal right ophthalmic artery origin. Patent carotid termini. Patent bilateral MCA and left ACA origins. The left A1 is dominant and the right diminutive or absent. Anterior communicating artery and bilateral ACA branches are within normal limits. Left MCA M1 segment and bifurcation are patent without stenosis. Right MCA proximal M1 segment is patent but becomes near occluded more distally corresponding to the hyperdense segment by plain CT. The mid and distal right M1 segment is affected (series 11, image 20 and series 10, image 19) with intermediate to poor reconstitution of right M2 and M3 branches (series 12, image 9). Venous sinuses: Early contrast timing, grossly patent. Anatomic variants: Mildly dominant right vertebral artery. Review of the MIP images confirms the above findings IMPRESSION: 1. Positive for Right MCA Emergent Large Vessel Occlusion affecting the mid and distal M1 segment. 2. Positive also for superimposed Critical Stenosis cervical Right ICA beginning at the origin, throughout the proximal 3.5 cm of the vessel. There are tandem  RADIOGRAPHIC STRING SIGN STENOSES. 3. The above was discussed by telephone with to Dr. Otelia Limes at 1338 hours on 04-10-2019. 4. Additional severe atherosclerosis and multifocal high-grade arterial stenoses elsewhere: - Left ICA distal bulb RADIOGRAPHIC STRING SIGN STENOSIS. - Severe bilateral Vertebral Artery origin stenoses. - proximal Left Subclavian Artery moderate stenosis due to plaque and tortuosity. 5.  Aortic Atherosclerosis (ICD10-I70.0). Electronically Signed   By: Odessa Fleming M.D.   On: Apr 10, 2019 14:16   MR BRAIN WO CONTRAST  Result Date: 03/23/2019 CLINICAL DATA:  Stroke, follow-up. EXAM: MRI HEAD WITHOUT CONTRAST TECHNIQUE: Multiplanar, multiecho pulse sequences of the brain and surrounding structures were obtained without intravenous contrast. COMPARISON:  Noncontrast CT head, CT angiogram head/neck and CT perfusion Apr 10, 2019 FINDINGS: Brain: There is an acute cortical/subcortical infarct within the anterolateral right temporal lobe measuring 3.1 x 1.5 cm in transaxial dimensions (series 5, image 67). There are numerous additional small scattered cortical and subcortical infarcts within the right cerebral hemisphere MCA vascular territory involving the right frontal lobe, parietal lobe, occipital lobe and temporal lobe as well as right insula. There is no significant mass effect. No midline shift or extra-axial fluid collection. No chronic intracranial blood products. Moderate patchy T2/FLAIR hyperintensity within the cerebral white matter is nonspecific, but consistent with chronic small vessel ischemic disease. Moderate generalized parenchymal atrophy. Vascular: SWI signal loss in the region of proximal right M2 MCA branches (series 12, image 23) which may reflect residual thrombus. Skull and upper cervical spine: No focal marrow lesion. Incompletely assessed upper cervical spondylosis. Sinuses/Orbits: Visualized orbits demonstrate no acute abnormality. Paranasal sinus mucosal thickening. Most notably  there is moderate mucosal thickening within bilateral ethmoid air cells. Left sphenoid sinus air-fluid level. Small bilateral mastoid effusions. IMPRESSION: 3.1 cm acute cortical/subcortical infarct within the anterolateral right temporal lobe. Numerous additional small scattered acute cortical and subcortical infarct within the right cerebral hemisphere MCA vascular territory. No significant mass effect or evidence of parenchymal hemorrhage. SWI signal loss in the region of proximal right M2 MCA branches which may reflect residual thrombus. Moderate generalized parenchymal atrophy and chronic small vessel ischemic disease. Paranasal sinus disease as described. Small bilateral mastoid effusions. Electronically Signed   By: Jackey Loge DO   On: 03/23/2019 13:43   DG Chest Port 1 View  Result Date: 2019/04/10 CLINICAL DATA:  Respiratory failure. EXAM: PORTABLE CHEST 1 VIEW COMPARISON:  October 09, 2013 FINDINGS: An endotracheal tube is seen with  its distal tip approximately 3.7 cm from the carina. A nasogastric tube is noted with its distal end overlying the body of the stomach. Very mild atelectasis and/or infiltrate is seen along the medial aspect of the left lung base. There is no evidence of a pleural effusion or pneumothorax. There is moderate to marked severity enlargement of the cardiac silhouette. The visualized skeletal structures are unremarkable. IMPRESSION: 1. Endotracheal tube and nasogastric tube in good position. 2. Moderate to marked severity enlargement of the cardiac silhouette. 3. Mild left basilar atelectasis and/or infiltrate. Electronically Signed   By: Aram Candelahaddeus  Houston M.D.   On: 03/28/2019 19:05   ECHOCARDIOGRAM COMPLETE  Result Date: 03/23/2019    ECHOCARDIOGRAM REPORT   Patient Name:   Nancee LiterHARRY H Waiters Date of Exam: 03/23/2019 Medical Rec #:  [id number]        Height:       71.0 in Accession #:    [id number]       Weight:       189.2 lb Date of Birth:  06-13-1931        BSA:          2.06  m Patient Age:    88 years         BP:           112/66 mmHg Patient Gender: M                HR:           55 bpm. Exam Location:  Inpatient Procedure: 2D Echo, Cardiac Doppler and Color Doppler Indications:    CVA  History:        Patient has no prior history of Echocardiogram examinations.                 Stroke, Arrythmias:Bradycardia; Risk Factors:Hypertension.  Sonographer:    Lavenia AtlasBrooke Strickland Referring Phys: [phone] ERIC LINDZEN  Sonographer Comments: Echo performed with patient supine and on artificial respirator. Image acquisition challenging due to respiratory motion. IMPRESSIONS  1. Left ventricular ejection fraction, by estimation, is 45%. The left ventricle has mildly decreased function. The left ventrical demonstrates regional wall motion abnormalities (see scoring diagram/findings for description). Akinesis of the mid to apical anteroseptal and inferoseptal walls. Left ventricular diastolic parameters are consistent with Grade I diastolic dysfunction (impaired relaxation).  2. Right ventricular systolic function is mildly reduced. The right ventricular size is mildly enlarged. There is mildly elevated pulmonary artery systolic pressure. The estimated right ventricular systolic pressure is 33.8 mmHg.  3. The aortic valve is tricuspid. Trivial aortic insufficiency, no aortic stenosis.  4. No significant mitral regurgitation.  5. Dilated IVC with estimated RA pressure 15 mmHg. FINDINGS  Left Ventricle: Left ventricular ejection fraction, by estimation, is 45%. The left ventricle has mildly decreased function. The left ventricle demonstrates regional wall motion abnormalities. The left ventricular internal cavity size was normal in size. There is mildly increased left ventricular hypertrophy. Right Ventricle: The right ventricular size is mildly enlarged. No increase in right ventricular wall thickness. Right ventricular systolic function is mildly reduced. There is mildly elevated pulmonary artery  systolic pressure. The tricuspid regurgitant  velocity is 2.54 m/s, and with an assumed right atrial pressure of 8 mmHg, the estimated right ventricular systolic pressure is 33.8 mmHg. Left Atrium: Left atrial size was normal in size. Right Atrium: Right atrial size was normal in size. Pericardium: There is no evidence of pericardial effusion. Mitral Valve: The mitral valve is normal in structure and  function. No evidence of mitral valve regurgitation. No evidence of mitral valve stenosis. Tricuspid Valve: The tricuspid valve is normal in structure. Tricuspid valve regurgitation is mild. Aortic Valve: The aortic valve is tricuspid. Aortic valve regurgitation is trivial. Mild aortic valve sclerosis is present, with no evidence of aortic valve stenosis. Pulmonic Valve: The pulmonic valve was normal in structure. Pulmonic valve regurgitation is not visualized. Aorta: The aortic root and ascending aorta are structurally normal, with no evidence of dilitation. Venous: The inferior vena cava is dilated in size with less than 50% respiratory variability, suggesting right atrial pressure of 15 mmHg. IAS/Shunts: No atrial level shunt detected by color flow Doppler.  LEFT VENTRICLE PLAX 2D LVIDd:         4.63 cm  Diastology LVIDs:         3.40 cm  LV e' lateral:   4.35 cm/s LV PW:         1.37 cm  LV E/e' lateral: 10.5 LV IVS:        1.36 cm  LV e' medial:    4.24 cm/s LVOT diam:     2.20 cm  LV E/e' medial:  10.8 LV SV:         62.72 ml LV SV Index:   24.65 LVOT Area:     3.80 cm  RIGHT VENTRICLE RV Basal diam:  3.02 cm RV S prime:     5.87 cm/s TAPSE (M-mode): 3.3 cm LEFT ATRIUM           Index       RIGHT ATRIUM           Index LA diam:      4.30 cm 2.09 cm/m  RA Area:     17.80 cm LA Vol (A4C): 49.5 ml 24.04 ml/m RA Volume:   45.40 ml  22.05 ml/m  AORTIC VALVE LVOT Vmax:   75.00 cm/s LVOT Vmean:  51.800 cm/s LVOT VTI:    0.165 m  AORTA Ao Root diam: 2.90 cm MITRAL VALVE                        TRICUSPID VALVE MV Area  (PHT): 2.16 cm             TR Peak grad:   25.8 mmHg MV Decel Time: 352 msec             TR Vmax:        254.00 cm/s MV E velocity: 45.80 cm/s 103 cm/s MV A velocity: 62.10 cm/s 70.3 cm/s SHUNTS MV E/A ratio:  0.74       1.5       Systemic VTI:  0.16 m                                     Systemic Diam: 2.20 cm Marca Ancona MD Electronically signed by Marca Ancona MD Signature Date/Time: 03/23/2019/3:23:23 PM    Final    CT HEAD CODE STROKE WO CONTRAST  Result Date: 04/09/2019 CLINICAL DATA:  Code stroke. 86 year old male with left facial droop and aphasia. EXAM: CT HEAD WITHOUT CONTRAST TECHNIQUE: Contiguous axial images were obtained from the base of the skull through the vertex without intravenous contrast. COMPARISON:  Head CT 12/25/2016. FINDINGS: Brain: Cerebral volume is not significantly changed since 2018. No midline shift, mass effect, or evidence of intracranial mass lesion. No ventriculomegaly. No acute  intracranial hemorrhage identified. Scattered bilateral cerebral white matter hypodensity has increased. The deep gray nuclei, brainstem and cerebellum remain within normal limits. No cortically based acute infarct identified. Vascular: Calcified atherosclerosis at the skull base. There is asymmetric right MCA M1 or proximal M2 hyperdensity best seen on series 5, image 29 which is new from 2018. Skull: Negative. Sinuses/Orbits: Visualized paranasal sinuses and mastoids are stable and well pneumatized. Other: Negative orbit and scalp soft tissues. ASPECTS [facility] Stroke Program Early CT Score) Total score (0-10 with 10 being normal): 10 IMPRESSION: 1. Hyperdense Right MCA suspicious for emergent large vessel occlusion. But no CT changes of acute cortically based infarct identified. ASPECTS 10. 2. No acute intracranial hemorrhage. Increased scattered white matter hypodensity since 2018. 3. These results were communicated to Dr. Otelia Limes at 1:14 pmon 02/24/2021by text page via the [facility] messaging system.  Electronically Signed   By: Odessa Fleming M.D.   On: 03/23/2019 13:16   Cerebral Angiogram 03/21/2019 S/P bilateral common carotid arteriograms followed by complete revascularization of RT MCA pre occlusive thrombus following balloon angioplasty with stent placement at prox RT ICA for severe pre occlusive stenosis ,and balloon angioplasty of heavily calcified plaque in proc RT ICAA with a patency  Of approx 80 % .  With TICI 3 revascularization    PHYSICAL EXAM  Temp:  [97.6 F (36.4 C)-99.2 F (37.3 C)] 99.2 F (37.3 C) (02/08 1600) Pulse Rate:  [36-90] 69 (02/08 1615) Resp:  [13-24] 18 (02/08 1615) BP: (92-146)/(51-89) 134/59 (02/08 1600) SpO2:  [92 %-100 %] 92 % (02/08 1615) Arterial Line BP: (93-175)/(44-78) 99/70 (02/08 1615) FiO2 (%):  [40 %] 40 % (02/08 1528)  General - Well nourished, well developed, intubated on sedation.  Ophthalmologic - fundi not visualized due to noncooperation.  Cardiovascular - Regular rate and rhythm.  Neuro - intubated on sedation with fentanyl and propofol, eyes closed, not following commands. With forced eye opening, eyes in mid position, not blinking to visual threat, doll's eyes absent, not tracking, pupils equal size but very sluggish to light. Corneal reflex absent bilaterally, gag and cough present. Breathing over the vent.  Facial symmetry not able to test due to ET tube.  Tongue protrusion not cooperative. On pain stimulation, withdraw of all extremities with LUE and LLE against gravity but RUE and RLE 2+/5. DTR 1+ and no babinski. Sensation, coordination and gait not tested.   ASSESSMENT/PLAN Mr. TOBBY FAWCETT is a 86 y.o. male with history of HTN presenting with slurred speech and L facial droop. Received tPA 03/28/2019 at 1324. Taken for IR for distal R M1 thrombus.   Stroke:   R MCA scattered infarcts s/p tPA and IR w/ R ICA stent placement, infarct secondary to large vessel disease source  CT head hyperdense R MCA.     CTA head & neck R MCA  LVO mid and distal segment. Cervical R ICA critical stenosis w/ random string sign stenoses. L ICA distal bulb string sign, severe B VA origin stenoses   Cerebral angio TICI 3 revascularization R MCA preocclusive thrombus w/ angioplasty and stent at proximal R ICA and angioplasty of 80% plaque in L ICA.   MRI  R temporal lobe cortical/ subcortical infarct w/ numerous small scattered R brain infarcts. Proximal R M2 MCA w/ loss of signal likely residual thrombus.    2D Echo EF 45%, no SOE seen  LDL 80  HgbA1c pending   lovenox for VTE prophylaxis  No antithrombotic prior to admission, now on aspirin  81 mg daily and Brilinta (ticagrelor) 90 mg bid.   Therapy recommendations:  pending   Disposition:  pending   Vent dependent respiratory failure  intubate for IR, left intubated overnight  Sedated   Plan for extubation today  Carotid stenosis  CTA neck - bilateral ICA bulb string sign  S/p right ICA stenting  S/p left ICA angioplasty  Consider further left ICA intervention in the near future - Dr. Corliss Skainseveshwar aware  Hx of Hypertension hypotension  Home meds:  Valsartan-HCTZ 160-12.5, amlodipine 5  On levophed gtt . BP goal 120-140 following IR procedure and tPA administration.  . Long-term BP goal normotensive  Hyperlipidemia  Home meds:  No statin  LDL 80, goal < 70  On lipitor 40   Continue statin at discharge  Dysphagia . Secondary to stroke . NPO . Speech to see once extubated  Other Stroke Risk Factors  Advanced age  Former Cigarette smoker, quit 45 yrs ago  ETOH use, will advise to drink no more than 2 drink(s) a day   Other Active Problems  Dementia, on aricept and namenda  Urinary retention, foley placed  Hospital day # 1  This patient is critically ill due to right MCA stroke, bilateral carotid stenosis, status post TPA, and IR, respiratory failure and at significant risk of neurological worsening, death form recurrent stroke, hemorrhagic  conversion, seizure, heart failure. This patient's care requires constant monitoring of vital signs, hemodynamics, respiratory and cardiac monitoring, review of multiple databases, neurological assessment, discussion with family, other specialists and medical decision making of high complexity. I spent 40 minutes of neurocritical care time in the care of this patient.  Marvel PlanJindong Anthonio Mizzell, MD PhD Stroke Neurology 03/23/2019 7:48 PM  To contact Stroke Continuity provider, please refer to [email or web address]. After hours, contact General Neurology

## 2019-03-23 NOTE — Progress Notes (Signed)
OT Cancellation Note  Patient Details Name: Jonathan QUEBEDEAUX MRN: [id number] DOB: [birth year]   Cancelled Treatment:    Reason Eval/Treat Not Completed: Patient not medically ready;Active bedrest order(mesh removed; needs MRI and 24 hour bed rest.)   Flora Lipps OTR/L Acute Rehabilitation Services Pager: [phone] Office: [phone]  Rachna Schonberger C 03/23/2019, 2:42 PM

## 2019-03-24 ENCOUNTER — Inpatient Hospital Stay: Payer: Self-pay

## 2019-03-24 ENCOUNTER — Inpatient Hospital Stay (HOSPITAL_COMMUNITY): Payer: Medicare Other

## 2019-03-24 DIAGNOSIS — I952 Hypotension due to drugs: Secondary | ICD-10-CM

## 2019-03-24 DIAGNOSIS — G9340 Encephalopathy, unspecified: Secondary | ICD-10-CM

## 2019-03-24 DIAGNOSIS — J9601 Acute respiratory failure with hypoxia: Secondary | ICD-10-CM

## 2019-03-24 LAB — CBC
HCT: 39.5 % (ref 39.0–52.0)
Hemoglobin: 12.8 g/dL — ABNORMAL LOW (ref 13.0–17.0)
MCH: 33.1 pg (ref 26.0–34.0)
MCHC: 32.4 g/dL (ref 30.0–36.0)
MCV: 102.1 fL — ABNORMAL HIGH (ref 80.0–100.0)
Platelets: 130 10*3/uL — ABNORMAL LOW (ref 150–400)
RBC: 3.87 MIL/uL — ABNORMAL LOW (ref 4.22–5.81)
RDW: 13.6 % (ref 11.5–15.5)
WBC: 11.4 10*3/uL — ABNORMAL HIGH (ref 4.0–10.5)
nRBC: 0 % (ref 0.0–0.2)

## 2019-03-24 LAB — RENAL FUNCTION PANEL
Albumin: 2.5 g/dL — ABNORMAL LOW (ref 3.5–5.0)
Anion gap: 8 (ref 5–15)
BUN: 13 mg/dL (ref 8–23)
CO2: 21 mmol/L — ABNORMAL LOW (ref 22–32)
Calcium: 8.4 mg/dL — ABNORMAL LOW (ref 8.9–10.3)
Chloride: 111 mmol/L (ref 98–111)
Creatinine, Ser: 1.19 mg/dL (ref 0.61–1.24)
GFR calc Af Amer: >60 mL/min (ref >60)
GFR calc non Af Amer: 55 mL/min — ABNORMAL LOW (ref >60)
Glucose, Bld: 145 mg/dL — ABNORMAL HIGH (ref 70–99)
Phosphorus: 1.9 mg/dL — ABNORMAL LOW (ref 2.5–4.6)
Potassium: 3.9 mmol/L (ref 3.5–5.1)
Sodium: 140 mmol/L (ref 135–145)

## 2019-03-24 LAB — GLUCOSE, CAPILLARY
Glucose-Capillary: 132 mg/dL — ABNORMAL HIGH (ref 70–99)
Glucose-Capillary: 145 mg/dL — ABNORMAL HIGH (ref 70–99)
Glucose-Capillary: 139 mg/dL — ABNORMAL HIGH (ref 70–99)
Glucose-Capillary: 153 mg/dL — ABNORMAL HIGH (ref 70–99)
Glucose-Capillary: 171 mg/dL — ABNORMAL HIGH (ref 70–99)

## 2019-03-24 LAB — HEMOGLOBIN A1C
Hgb A1c MFr Bld: 5.9 % — ABNORMAL HIGH (ref 4.8–5.6)
Mean Plasma Glucose: 123 mg/dL

## 2019-03-24 LAB — MAGNESIUM
Magnesium: 2 mg/dL (ref 1.7–2.4)
Magnesium: 1.7 mg/dL (ref 1.7–2.4)

## 2019-03-24 LAB — HEPARIN LEVEL (UNFRACTIONATED): Heparin Unfractionated: 0.2 IU/mL — ABNORMAL LOW (ref 0.30–0.70)

## 2019-03-24 LAB — TRIGLYCERIDES: Triglycerides: 133 mg/dL (ref <150)

## 2019-03-24 LAB — TROPONIN I (HIGH SENSITIVITY): Troponin I (High Sensitivity): 27 ng/L — ABNORMAL HIGH (ref <18)

## 2019-03-24 LAB — PLATELET INHIBITION P2Y12: Platelet Function  P2Y12: 8 [PRU] — ABNORMAL LOW (ref 182–335)

## 2019-03-24 LAB — PROCALCITONIN: Procalcitonin: 0.29 ng/mL

## 2019-03-24 LAB — PHOSPHORUS: Phosphorus: 1.5 mg/dL — ABNORMAL LOW (ref 2.5–4.6)

## 2019-03-24 MED ORDER — ATORVASTATIN CALCIUM 40 MG PO TABS
40.0000 mg | ORAL_TABLET | Freq: Every day | ORAL | Status: DC
  Administered 2019-03-24 – 2019-04-16 (×22): 40 mg
  Filled 2019-03-24 (×23): qty 1
  Filled 2019-03-24: qty 4

## 2019-03-24 MED ORDER — PROPOFOL 1000 MG/100ML IV EMUL
5.0000 ug/kg/min | INTRAVENOUS | Status: DC
  Administered 2019-03-24 – 2019-03-25 (×3): 20 ug/kg/min via INTRAVENOUS
  Administered 2019-03-25: 12:00:00 30 ug/kg/min via INTRAVENOUS
  Filled 2019-03-24 (×5): qty 100

## 2019-03-24 MED ORDER — POTASSIUM CHLORIDE 20 MEQ/15ML (10%) PO SOLN
40.0000 meq | Freq: Once | ORAL | Status: AC
  Administered 2019-03-24: 10:00:00 40 meq
  Filled 2019-03-24: qty 30

## 2019-03-24 MED ORDER — SODIUM CHLORIDE 0.9 % IV SOLN
1.5000 g | Freq: Three times a day (TID) | INTRAVENOUS | Status: DC
  Administered 2019-03-24 – 2019-03-29 (×15): 1.5 g via INTRAVENOUS
  Filled 2019-03-24 (×2): qty 4
  Filled 2019-03-24: qty 1.5
  Filled 2019-03-24 (×2): qty 4
  Filled 2019-03-24: qty 1.5
  Filled 2019-03-24: qty 4
  Filled 2019-03-24: qty 1.5
  Filled 2019-03-24 (×2): qty 4
  Filled 2019-03-24: qty 1.5
  Filled 2019-03-24 (×3): qty 4
  Filled 2019-03-24: qty 1.5
  Filled 2019-03-24 (×3): qty 4
  Filled 2019-03-24: qty 1.5
  Filled 2019-03-24: qty 4

## 2019-03-24 MED ORDER — VITAL HIGH PROTEIN PO LIQD: 1000.0000 mL | ORAL | Status: DC

## 2019-03-24 MED ORDER — HEPARIN (PORCINE) 25000 UT/250ML-% IV SOLN
1800.0000 [IU]/h | INTRAVENOUS | Status: DC
  Administered 2019-03-24: 14:00:00 1200 [IU]/h via INTRAVENOUS
  Administered 2019-03-25: 10:00:00 1450 [IU]/h via INTRAVENOUS
  Administered 2019-03-26 – 2019-03-27 (×3): 1800 [IU]/h via INTRAVENOUS
  Filled 2019-03-24 (×5): qty 250

## 2019-03-24 MED ORDER — PRO-STAT SUGAR FREE PO LIQD
30.0000 mL | Freq: Two times a day (BID) | ORAL | Status: DC
  Filled 2019-03-24: qty 30

## 2019-03-24 MED ORDER — VITAL AF 1.2 CAL PO LIQD
1000.0000 mL | ORAL | Status: DC
  Administered 2019-03-24 – 2019-03-25 (×2): 1000 mL

## 2019-03-24 MED ORDER — SODIUM CHLORIDE 0.9% FLUSH
10.0000 mL | Freq: Two times a day (BID) | INTRAVENOUS | Status: DC
  Administered 2019-03-24: 12:00:00 10 mL
  Administered 2019-03-24: 20 mL
  Administered 2019-03-25 – 2019-03-30 (×10): 10 mL

## 2019-03-24 MED ORDER — PRO-STAT SUGAR FREE PO LIQD
30.0000 mL | Freq: Every day | ORAL | Status: DC
  Administered 2019-03-24 – 2019-03-25 (×2): 30 mL
  Filled 2019-03-24: qty 30

## 2019-03-24 MED ORDER — SODIUM CHLORIDE 0.9% FLUSH
10.0000 mL | INTRAVENOUS | Status: DC | PRN
  Administered 2019-04-02: 21:00:00 10 mL

## 2019-03-24 NOTE — Progress Notes (Signed)
Pharmacy Antibiotic Note  Jonathan Berger is a 86 y.o. male admitted on 03/28/2019 with aspiration pneumonia.  Pharmacy has been consulted for Unasyn dosing.  Creatinine clearance 47 ml/min.  Plan: Start Unasyn 1.5 gm IV q8hr Monitor renal function and C&S  Height: 5\' 11"  (180.3 cm) Weight: 189 lb 2.5 oz (85.8 kg) IBW/kg (Calculated) : 75.3  Temp (24hrs), Avg:98.9 F (37.2 C), Min:98.2 F (36.8 C), Max:99.3 F (37.4 C)  Recent Labs  Lab 28-Mar-2019 1303 03-28-19 1308 03/23/19 0303 03/24/19 0423  WBC 9.5  --  14.3* 11.4*  CREATININE 1.36* 1.30* 1.17 1.19    Estimated Creatinine Clearance: 47.5 mL/min (by C-G formula based on SCr of 1.19 mg/dL).    No Known Allergies  Antimicrobials this admission: Unasyn 2/9 >>   Microbiology results: 2/8 Sputum:GNR + GPC +GVR - reincubating 2/8 MRSA PCR: negative  Thank you for allowing pharmacy to be a part of this patient's care.  4/8, PharmD, [facility] Clinical Pharmacist Please see AMION for all Pharmacists' Contact Phone Numbers 03/24/2019, 8:55 AM

## 2019-03-24 NOTE — Progress Notes (Signed)
PULMONARY / CRITICAL CARE MEDICINE   NAME:  Jonathan Berger, MRN:  [id number], DOB:  1932-01-19, LOS: 2 ADMISSION DATE:  04/01/19, CONSULTATION DATE: 04/01/19 REFERRING MD: Interventional radiology neurology, CHIEF COMPLAINT: Vent dependent respiratory failure secondary to procedure  BRIEF HISTORY:    86 year old presenting 2/7 with slurred speech and dysphagia found to have acute right middle cerebral artery stroke s/p tPA and EVR who returns to ICU on mechanical ventilation overnight.   SIGNIFICANT PAST MEDICAL HISTORY   Hypertension  SIGNIFICANT EVENTS:  04/01/2019 right MCA stroke  STUDIES:   2/7 CTH >> 1. Hyperdense Right MCA suspicious for emergent large vessel occlusion. But no CT changes of acute cortically based infarct identified. ASPECTS 10. 2. No acute intracranial hemorrhage. Increased scattered white matter hypodensity since 2018.  2/7 CTA head/ neck>> 1. Positive for Right MCA Emergent Large Vessel Occlusion affecting the mid and distal M1 segment. 2. Positive also for superimposed Critical Stenosis cervical Right ICA beginning at the origin, throughout the proximal 3.5 cm of the vessel. There are tandem RADIOGRAPHIC STRING SIGN STENOSES. 3. The above was discussed by telephone with to Dr. Otelia Limes at 1338 hours on 01-Apr-2019. 4. Additional severe atherosclerosis and multifocal high-grade arterial stenoses elsewhere: - Left ICA distal bulb RADIOGRAPHIC STRING SIGN STENOSIS. - Severe bilateral Vertebral Artery origin stenoses. - proximal Left Subclavian Artery moderate stenosis due to plaque and tortuosity. 5.  Aortic Atherosclerosis   2/8 TTE >> 1. Left ventricular ejection fraction, by estimation, is 45%. The left ventricle has mildly decreased function. The left ventrical demonstrates regional wall motion abnormalities (see scoring diagram/findings for description). Akinesis of the mid to apical anteroseptal and inferoseptal walls. Left ventricular diastolic  parameters  are consistent with Grade I diastolic dysfunction (impaired  relaxation).  2. Right ventricular systolic function is mildly reduced. The right ventricular size is mildly enlarged. There is mildly elevated pulmonary artery systolic pressure. The estimated right ventricular systolic pressure is 33.8 mmHg.  3. The aortic valve is tricuspid. Trivial aortic insufficiency, no aortic stenosis.  4. No significant mitral regurgitation.  5. Dilated IVC with estimated RA pressure 15 mmHg.   2/8 MRI Brain >> 3.1 cm acute cortical/subcortical infarct within the anterolateral right temporal lobe. Numerous additional small scattered acute cortical and subcortical infarct within the right cerebral hemisphere MCA vascular territory. No significant mass effect or evidence of parenchymal hemorrhage. SWI signal loss in the region of proximal right M2 MCA branches which may reflect residual thrombus. Moderate generalized parenchymal atrophy and chronic small vessel ischemic disease. Paranasal sinus disease as described. Small bilateral mastoid effusions.  CULTURES:  2/7 SARS2/ Flu A/B >> neg  ANTIBIOTICS:  2/7 cefazolin pre op 2/9 unasyn >>  LINES/TUBES:  04-01-19 endotracheal tube >> 04-01-2019 left radial A-line >>2/9 04/01/19 right femoral sheath >>2/9  CONSULTANTS:  2019-04-01 pulmonary critical care   SUBJECTIVE:  Increasing trach secretions- dark brownish/green liquid yesterday afternoon, now thick dark/ tan with intermittent desaturation episodes overnight requiring increased FiO2    Ongoing intermittent agitation, remains on precedex (limited at 0.8 due to bradycardia) and fentanyl gtt at 200 mcg/hr, still requiring levophed 6-10 mcg/min to keep SBP goals  Tmax 99.3, WBC better at 11.4, PCT trending up                      CONSTITUTIONAL: BP (!) 166/81 (BP Location: Right Arm)   Pulse 62   Temp 99.2 F (37.3 C) (Axillary)   Resp 16   Ht 5\' 11"  (  1.803 m)   Wt 85.8 kg   SpO2 91%    BMI 26.38 kg/m   I/O last 3 completed shifts: In: 4692.6 [I.V.:4109.9; IV Piggyback:582.7] Out: 1930 [Urine:1680; Emesis/NG output:250]     Vent Mode: PRVC FiO2 (%):  [40 %-60 %] 50 % Set Rate:  [18 bmp] 18 bmp Vt Set:  [600 mL] 600 mL PEEP:  [5 cmH20] 5 cmH20 Plateau Pressure:  [16 cmH20-19 cmH20] 19 cmH20  PHYSICAL EXAM:  General:  Elderly male sedated on MV in NAD HEENT: MM pink/moist, pupils 2/very sluggish, anicteric,  ETT/ OGT Neuro: sedated, but easily agitated, MAE, not f/c / or opens eyes CV: RR IR, sinus brady, frequent PVC, no murmur, right groin site soft/ dressing dry, distal +2 pulses PULM:  Non labored on MV, diffusely coarse, diminished right base, small amount of thick tan  GI: soft, bs+, foley Extremities: warm/dry, no LE edema  Skin: no rashes                                                                                  RESOLVED PROBLEM LIST   ASSESSMENT AND PLAN    Respiratory insufficiency in the post op setting initially since complicated ongoing hypoxia, possible developing aspiration PNA vs RLL atelectasis  - 2/9 CXR reviewed- rotated, satisfactory position of ETT/ OGT, new RLL infiltrate vs atelectasis  P:  Continue full MV support, PRVC 8cc/kg, rate 18 Wean FiO2 for sat goal > 94-99 Not hopeful for extubation today given ongoing desaturation episodes/ secretions Trend CXR/ ABG VAP bundle PPI  Given increasing PCT/ hypoxic episodes/ secretions, will start empiric unasyn Follow trach asp sent 2/8 PAD protocol with fentanyl gtt, wean as tolerated and changed to precedex yesterday afternoon to help facilitate weaning efforts as he is either sedated or extremely agitated.  Precedex dose is limited given bradycardia, may be affecting BP but levophed dose is stable between 6-10 mcg/min, which is same as when on propofol. RASS goal 0/-1.  Daily DWA/ SBT if meets criteria    R MCA infarct s/p tPA and IR with L ICA stent placement P:  Per Neurology   SBP goal per Neurology > 110; <180 MRI brain as above  Ongoing serial neuro exams Continue soft collar per Neuro IR ASA/ Brillinta  TTE as below   Hypotension  - possibly due to sedation vs related to ICA stent with carotid sinus being affected vs ?developing sepsis vs cardiogenic component given EF 45% (unclear if this new, no prior for comparison) Hx HTN  P:  Tele monitoring  PICC ordered as levophed PIV dose is at max SBP goal per Neuro >110; < 180 RA pressures on TTE yesterday 15, thus volume status appears adequate, does not look volume up on exam  Following cultures  abx as above Trend PCT  UOP remains great, normal sCr  Trending renal function  Will check EKG and trend troponin's Will need cardiology consult given TTE findings of HFrEF/ HFpEF and regional wall motion abnormality    Hypokalemia - better today but goal > 4, Mag > 2 P:  KCL 40 meq now  Trend BMP/ Mag    Leukocytosis - stable, PCT up  -  UA not overwhelming  P:  Follow cultures (UC/ trach asp) unasyn started today for possible aspiration pna  Trend fever curve/ WBC  Best Practice / Goals of Care / Disposition.   DVT PROPHYLAXIS: SCDS only/ adding heparin SQ if ok with neuro  (24hr post tPA) SUP: PPI NUTRITION: NPO, starting TF today  MOBILITY: Bedrest GOALS OF CARE: Full code FAMILY DISCUSSIONS:  Wife updated yesterday at bedside. Scheduled to visit this am and will update then.  DISPOSITION: ICU   LABS  Glucose Recent Labs  Lab 03/23/19 0834 03/23/19 1327 03/23/19 1606 03/23/19 2021 03/23/19 2354 03/24/19 0353  GLUCAP 121* 105* 108* 164* 145* 132*    BMET Recent Labs  Lab 04/03/19 1303 04-03-19 1303 03-Apr-2019 1308 04/03/2019 1308 03/23/19 0303 03/23/19 1640 03/24/19 0423  NA 136   < > 137   < > 139 141 140  K 3.6   < > 3.6   < > 3.2* 3.8 3.9  CL 102   < > 102  --  105  --  111  CO2 25  --   --   --  20*  --  21*  BUN 22   < > 24*  --  17  --  13  CREATININE 1.36*   < >  1.30*  --  1.17  --  1.19  GLUCOSE 160*   < > 155*  --  154*  --  145*   < > = values in this interval not displayed.    Liver Enzymes Recent Labs  Lab 04/03/19 1303 03/24/19 0423  AST 19  --   ALT 15  --   ALKPHOS 54  --   BILITOT 0.8  --   ALBUMIN 3.5 2.5*    Electrolytes Recent Labs  Lab 2019-04-03 1303 03/23/19 0303 03/24/19 0423  CALCIUM 10.3 9.0 8.4*  MG  --  1.7 2.0  PHOS  --   --  1.9*    CBC Recent Labs  Lab 04-03-2019 1303 03-Apr-2019 1308 03/23/19 0303 03/23/19 0303 03/23/19 1619 03/23/19 1640 03/24/19 0423  WBC 9.5  --  14.3*  --   --   --  11.4*  HGB 16.1   < > 13.6   < > 12.6* 11.9* 12.8*  HCT 48.0   < > 41.1   < > 38.5* 35.0* 39.5  PLT 145*  --  159  --   --   --  130*   < > = values in this interval not displayed.    ABG Recent Labs  Lab 03/23/19 1640  PHART 7.364  PCO2ART 33.0  PO2ART 72.0*    Coag's Recent Labs  Lab 2019-04-03 1303  APTT 28  INR 0.9    CCT 40 mins   Posey Boyer, MSN, AGACNP-BC [facility] 03/24/2019, 8:31 AM

## 2019-03-24 NOTE — Progress Notes (Signed)
Initial Nutrition Assessment  DOCUMENTATION CODES:   Not applicable  INTERVENTION:  Initiate Vital 1.2 @ 35 ml/hr, advance 10 ml every 4 hours to goal rate 65 ml/hr  Pro-stat 30 ml daily via OGT  Tube feed regimen at goal rate 65 ml/hr (1560 ml/day) provides 1972 kcal, 132 grams of protein, and 1264 ml free water   NUTRITION DIAGNOSIS:   Inadequate oral intake related to inability to eat as evidenced by NPO status.    GOAL:   Patient will meet greater than or equal to 90% of their needs    MONITOR:   Labs, Weight trends, I & O's, Vent status, TF tolerance, Skin, Diet advancement  REASON FOR ASSESSMENT:   Consult, Ventilator Enteral/tube feeding initiation and management  ASSESSMENT:  RD working remotely.  86 year old male with past medical history of HTN , anemia, and chronic back pain presented to ED with slurred speech and left facial droop. CTA showed right ICA near occulusion with distal M1 thrombus extending into M2 branch.  2/7 Intubated 2/7 IR - R MCA embolization and revascularization 2/8 TEE - Left EF ~45% 2/9 CXR - new RLL infiltrate vs atelectasis  Per notes, increasing trach secretions, dark brownish/green liquid yesterday afternoon, now dark/tan with intermittent desaturation episodes overnight requiring increased FiO2.  I/Os: +2247 ml since admit        +1466 ml x 24 hrs Emesis/NG 250 ml x 24 hrs UOP: 1180 ml x 24 hrs   Patient is currently intubated with sedation on ventilator support MV: 10.8 L/min Temp (24hrs), Avg:98.9 F (37.2 C), Min:98.2 F (36.8 C), Max:99.3 F (37.4 C)  Propofol: n/a  Medications reviewed and include: Protonix, KCl Drips: Unasyn Precedex 0.6 mcg Fentanyl 100 mvg Levo 7 mcg Labs:   NUTRITION - FOCUSED PHYSICAL EXAM: Unable to complete at this time, RD working remotely.   Diet Order:   Diet Order            Diet NPO time specified  Diet effective now              EDUCATION NEEDS:   No education  needs have been identified at this time  Skin:  Skin Assessment: Reviewed RN Assessment  Last BM:  PTA  Height:   Ht Readings from Last 1 Encounters:  2019-04-08 5\' 11"  (1.803 m)    Weight:   Wt Readings from Last 1 Encounters:  04/08/2019 85.8 kg    Ideal Body Weight:  78.2 kg  BMI:  Body mass index is 26.38 kg/m.  Estimated Nutritional Needs:   Kcal:  1872  Protein:  129-137  Fluid:  >/= 1.8 L/day    05/20/19, RD, LDN Clinical Nutrition Office Telephone [phone] After Hours/Weekend Pager: [phone]

## 2019-03-24 NOTE — Progress Notes (Signed)
SLP Cancellation Note  Patient Details Name: Jonathan Berger MRN: [id number] DOB: [birth year]   Cancelled treatment:       Reason Eval/Treat Not Completed: Patient not medically ready (on vent). Will f/u for cognitive-linguistic evaluation as able.    Mahala Menghini., M.A. CCC-SLP Acute Rehabilitation Services Pager [phone] Office [phone]  03/24/2019, 8:02 AM

## 2019-03-24 NOTE — Progress Notes (Addendum)
ANTICOAGULATION CONSULT NOTE - Follow Up Consult  Pharmacy Consult for Heparin Indication: atrial fibrillation  No Known Allergies  Patient Measurements: Height: 5\' 11"  (180.3 cm) Weight: 189 lb 2.5 oz (85.8 kg) IBW/kg (Calculated) : 75.3 Heparin Dosing Weight: 85.8 kg  Vital Signs: Temp: 100 F (37.8 C) (02/09 1600) Temp Source: Axillary (02/09 1600) BP: 163/71 (02/09 1915) Pulse Rate: 83 (02/09 1915)  Labs: Recent Labs    04/03/2019 1303 03/24/2019 1303 04/12/2019 1308 04/12/2019 1308 03/23/19 0303 03/23/19 0303 03/23/19 1619 03/23/19 1619 03/23/19 1640 03/24/19 0423 03/24/19 0950 03/24/19 1827  HGB 16.1   < > 16.0   < > 13.6   < > 12.6*   < > 11.9* 12.8*  --   --   HCT 48.0   < > 47.0   < > 41.1   < > 38.5*  --  35.0* 39.5  --   --   PLT 145*  --   --   --  159  --   --   --   --  130*  --   --   APTT 28  --   --   --   --   --   --   --   --   --   --   --   LABPROT 12.3  --   --   --   --   --   --   --   --   --   --   --   INR 0.9  --   --   --   --   --   --   --   --   --   --   --   HEPARINUNFRC  --   --   --   --   --   --   --   --   --   --   --  0.20*  CREATININE 1.36*   < > 1.30*  --  1.17  --   --   --   --  1.19  --   --   TROPONINIHS  --   --   --   --   --   --   --   --   --   --  27*  --    < > = values in this interval not displayed.    Estimated Creatinine Clearance: 47.5 mL/min (by C-G formula based on SCr of 1.19 mg/dL).   Medical History: Past Medical History:  Diagnosis Date  . Anemia   . Chronic back pain    herniated disc  . Hepatitis    50+yrs ago  . Hypertension    takes Amlodipine/Valsartan/HCTZ daily  . Personal history of kidney stones     Assessment: 86 yr old male presented 2/7 with slurred speech and dysphagia and was found to have acute right middle cerebral artery stroke, S/P tPA and EVR, who returned to ICU on mechanical ventilation overnight.   Pharmacy is consulted to start heparin for atrial fibrillation - no bolus.  Pt was on no anticoagulation PTA.  Heparin level ~4 hrs after initiation of heparin infusion at 1200 units/hr was 0.20 units/ml, which is below the desired goal range for this pt (however, heparin infusion was started ~1.5 hrs after ordered and heparin level was drawn early, so this 4-hr heparin level likely doesn't show full anticoagulation impact of the current heparin infusion rate). H/H 12.8/39.5, platelets 130. Per RN,  no issues with IV or bleeding observed.  Goal of Therapy:  Heparin level 0.3 - 0.5 units/ml Monitor platelets by anticoagulation protocol: Yes   Plan:  Continue heparin infusion at 1200 units/hr Check heparin level at ~2230 this evening (will be ~8 hrs after pt started on heparin infusion) Monitor daily heparin level, CBC Monitor for signs/symptoms of bleeding  Vicki Mallet, PharmD, BCPS, [facility] Clinical Pharmacist 03/24/2019, 7:26 PM

## 2019-03-24 NOTE — Progress Notes (Signed)
ANTICOAGULATION CONSULT NOTE - Initial Consult  Pharmacy Consult for Heparin Indication: atrial fibrillation  No Known Allergies  Patient Measurements: Height: 5\' 11"  (180.3 cm) Weight: 189 lb 2.5 oz (85.8 kg) IBW/kg (Calculated) : 75.3   Vital Signs: Temp: 100.3 F (37.9 C) (02/09 1200) Temp Source: Axillary (02/09 1200) BP: 166/81 (02/09 0800) Pulse Rate: 62 (02/09 0715)  Labs: Recent Labs    March 24, 2019 1303 24-Mar-2019 1303 24-Mar-2019 1308 Mar 24, 2019 1308 03/23/19 0303 03/23/19 0303 03/23/19 1619 03/23/19 1619 03/23/19 1640 03/24/19 0423 03/24/19 0950  HGB 16.1   < > 16.0   < > 13.6   < > 12.6*   < > 11.9* 12.8*  --   HCT 48.0   < > 47.0   < > 41.1   < > 38.5*  --  35.0* 39.5  --   PLT 145*  --   --   --  159  --   --   --   --  130*  --   APTT 28  --   --   --   --   --   --   --   --   --   --   LABPROT 12.3  --   --   --   --   --   --   --   --   --   --   INR 0.9  --   --   --   --   --   --   --   --   --   --   CREATININE 1.36*   < > 1.30*  --  1.17  --   --   --   --  1.19  --   TROPONINIHS  --   --   --   --   --   --   --   --   --   --  27*   < > = values in this interval not displayed.    Estimated Creatinine Clearance: 47.5 mL/min (by C-G formula based on SCr of 1.19 mg/dL).   Medical History: Past Medical History:  Diagnosis Date  . Anemia   . Chronic back pain    herniated disc  . Hepatitis    50+yrs ago  . Hypertension    takes Amlodipine/Valsartan/HCTZ daily  . Personal history of kidney stones     Assessment: 86 year old presenting 2/7 with slurred speech and dysphagia found to have acute right middle cerebral artery stroke s/p tPA and EVR who returns to ICU on mechanical ventilation overnight.  Pharmacy is consulted to start heparin for atrial fibrillation - no bolus.  Goal of Therapy:  Heparin level 0.3 - 0.5 units/ml Monitor platelets by anticoagulation protocol: Yes   Plan:  Start heparin infusion at 1200 units/hr Check anti-Xa  level in 6-8 hours and daily while on heparin Continue to monitor H&H and platelets  4/7, PharmD, [facility] Clinical Pharmacist Please see AMION for all Pharmacists' Contact Phone Numbers 03/24/2019, 12:43 PM

## 2019-03-24 NOTE — Progress Notes (Signed)
eLink Physician-Brief Progress Note Patient Name: Jonathan Berger DOB: 10-21-1931 MRN: [id number]   Date of Service  03/24/2019  HPI/Events of Note  Agitation and inadequate sedation on Precedex resulting in a high infusion rate of fentanyl. Triglyceride level 133.  eICU Interventions  Precedex discontinued. Propofol infusion ordered, RN instructed to wean Fentanyl once adequate sedation  (RAS 0 to -1) is achieved, to facilitate subsequent extubation.        Thomasene Lot [name] 03/24/2019, 9:02 PM

## 2019-03-24 NOTE — Progress Notes (Addendum)
Referring Physician(s): Code Stroke- Kerney Elbe  Supervising Physician: Luanne Bras  Patient Status:  [facility] - In-pt  Chief Complaint: None- intubated with sedation  Subjective:  Acute CVA s/p cerebral arteriogram s/p emergent stent placement of proximal right ICA severe pre-occlusive stenosis along with balloon angioplasty of proximal/mid right ICA achieving a TICI 3 revascularization 03/28/2019 by Dr. Estanislado Pandy. Patient laying in bed intubated with sedation. He opens eyes to name, follows some simple commands (can wiggle bilateral toes on command). Can spontaneously move all extremities, however upper extremities restrained for protection (pulling at tubes/lines). Right groin incision c/d/i.   Allergies: Patient has no known allergies.  Medications: Prior to Admission medications   Medication Sig Start Date End Date Taking? Authorizing Provider  amLODipine (NORVASC) 5 MG tablet Take 5 mg by mouth daily. 01/29/19  Yes [provider]  donepezil (ARICEPT) 5 MG tablet Take 5 mg by mouth at bedtime. 03/05/19  Yes [provider]  Ensure (ENSURE) Take 237 mLs by mouth 3 (three) times daily between meals.   Yes [provider]  memantine (NAMENDA) 5 MG tablet Take 5 mg by mouth 2 (two) times daily. 02/04/19  Yes [provider]  triamcinolone cream (KENALOG) 0.1 % Apply 1 application topically at bedtime. Apply to rash on back of right leg below knee   Yes [provider]  valsartan-hydrochlorothiazide (DIOVAN-HCT) 160-12.5 MG tablet Take 1 tablet by mouth daily. 01/29/19  Yes [provider]  amLODipine-valsartan (EXFORGE) 5-320 MG per tablet Take 1 tablet by mouth daily.    04/27/11  [provider]     Vital Signs: BP (!) 166/81 (BP Location: Right Arm)   Pulse 62   Temp 99.2 F (37.3 C) (Axillary)   Resp 16   Ht '5\' 11"'  (1.803 m)   Wt 189 lb 2.5 oz (85.8 kg)   SpO2 91%   BMI 26.38 kg/m   Physical  Exam Vitals and nursing note reviewed.  Constitutional:      General: He is not in acute distress.    Comments: Intubated with sedation.  Pulmonary:     Effort: Pulmonary effort is normal. No respiratory distress.     Comments: Intubated with sedation. Skin:    General: Skin is warm and dry.     Comments: Right groin incision soft without active bleeding or hematoma.  Neurological:     Comments: Intubated with sedation. He follows some simple commands (wiggles bilateral toes on command). PERRL bilaterally. Can spontaneously move all extremities. Distal pulses 1+ bilaterally.  Psychiatric:     Comments: Intubated with sedation.     Imaging: CT Code Stroke CTA Head W/WO contrast  Result Date: 03/25/2019 CLINICAL DATA:  86 year old male code stroke presentation with hyperdense right MCA on plain head CT. EXAM: CT ANGIOGRAPHY HEAD AND NECK TECHNIQUE: Multidetector CT imaging of the head and neck was performed using the standard protocol during bolus administration of intravenous contrast. Multiplanar CT image reconstructions and MIPs were obtained to evaluate the vascular anatomy. Carotid stenosis measurements (when applicable) are obtained utilizing NASCET criteria, using the distal internal carotid diameter as the denominator. CONTRAST:  43m OMNIPAQUE IOHEXOL 350 MG/ML SOLN COMPARISON:  Plain head CT 1306 hours today. FINDINGS: CTA NECK Skeleton: Absent maxillary dentition. Advanced degenerative changes in the cervical spine. No acute osseous abnormality identified. Upper chest: Mild upper lung atelectasis. No superior mediastinal lymphadenopathy. Other neck: Negative, no acute findings in the neck. Aortic arch: 3 vessel arch configuration. Mild to moderate arch  atherosclerosis, with mostly soft plaque in the distal arch. Right carotid system: No brachiocephalic or right CCA origin stenosis despite some plaque. No stenosis proximal to the bifurcation, but there is bulky soft plaque or thrombus  throughout the right ICA origin and bulb resulting in critical radiographic string sign stenosis on series 9, image 51. Superimposed additional bulky calcified plaque distal to the bulb also resulting in tandem string sign stenoses (series 5, image 105 and series 9 image 50). Despite this the vessel remains patent although is asymmetrically smaller to the skull base (about 3 mm diameter). Left carotid system: No left CCA origin stenosis despite plaque. At the left carotid bifurcation the left ICA origin is patent, but at the distal bulb there is bulky calcified more so than soft plaque resulting in severe stenosis at or approaching a radiographic string sign on series 5, image 103. The vessel remains patent and has a fairly normal caliber distal to this region to the skull base. Vertebral arteries: Calcified plaque in the proximal right subclavian artery with less than 50 % stenosis with respect to the distal vessel. Minor calcified plaque at the right vertebral artery origin without stenosis. However, there is bulky soft plaque in the right V1 segment resulting in moderate to severe stenosis on series 7, image 272. Distal to this the right vertebral caliber remains normal, and the vessel is then patent to the skull base without additional stenosis. Soft and calcified plaque plus tortuosity of the proximal left subclavian artery resulting in a kinked appearance and moderate stenosis as seen on series 9, image 109. Superimposed severe stenosis at the left vertebral artery origin due to soft plaque on series 8, image 133. The left vertebral is fairly codominant and then patent to the skull base without additional stenosis. CTA HEAD Posterior circulation: Non dominant left vertebral artery with mild calcified plaque does remain patent to the vertebrobasilar junction without stenosis. Moderate right V4 segment calcification with mild stenosis. Patent vertebrobasilar junction and basilar artery without stenosis. SCA and  PCA origins are patent. Posterior communicating arteries are diminutive or absent. There is mild bilateral PCA irregularity and stenosis. Anterior circulation: Both ICA siphons are patent. On the left there is mild to moderate calcified plaque without stenosis. Normal left ophthalmic artery origin. On the right there is mild calcified plaque without stenosis. Normal right ophthalmic artery origin. Patent carotid termini. Patent bilateral MCA and left ACA origins. The left A1 is dominant and the right diminutive or absent. Anterior communicating artery and bilateral ACA branches are within normal limits. Left MCA M1 segment and bifurcation are patent without stenosis. Right MCA proximal M1 segment is patent but becomes near occluded more distally corresponding to the hyperdense segment by plain CT. The mid and distal right M1 segment is affected (series 11, image 20 and series 10, image 19) with intermediate to poor reconstitution of right M2 and M3 branches (series 12, image 9). Venous sinuses: Early contrast timing, grossly patent. Anatomic variants: Mildly dominant right vertebral artery. Review of the MIP images confirms the above findings IMPRESSION: 1. Positive for Right MCA Emergent Large Vessel Occlusion affecting the mid and distal M1 segment. 2. Positive also for superimposed Critical Stenosis cervical Right ICA beginning at the origin, throughout the proximal 3.5 cm of the vessel. There are tandem RADIOGRAPHIC STRING SIGN STENOSES. 3. The above was discussed by telephone with to Dr. [name] at 1338 hours on 04/04/2019. 4. Additional severe atherosclerosis and multifocal high-grade arterial stenoses elsewhere: - Left ICA distal  bulb RADIOGRAPHIC STRING SIGN STENOSIS. - Severe bilateral Vertebral Artery origin stenoses. - proximal Left Subclavian Artery moderate stenosis due to plaque and tortuosity. 5.  Aortic Atherosclerosis (ICD10-I70.0). Electronically Signed   By: Genevie Ann M.D.   On: 04/10/2019 14:16    CT Code Stroke CTA Neck W/WO contrast  Result Date: 03/23/2019 CLINICAL DATA:  86 year old male code stroke presentation with hyperdense right MCA on plain head CT. EXAM: CT ANGIOGRAPHY HEAD AND NECK TECHNIQUE: Multidetector CT imaging of the head and neck was performed using the standard protocol during bolus administration of intravenous contrast. Multiplanar CT image reconstructions and MIPs were obtained to evaluate the vascular anatomy. Carotid stenosis measurements (when applicable) are obtained utilizing NASCET criteria, using the distal internal carotid diameter as the denominator. CONTRAST:  69m OMNIPAQUE IOHEXOL 350 MG/ML SOLN COMPARISON:  Plain head CT 1306 hours today. FINDINGS: CTA NECK Skeleton: Absent maxillary dentition. Advanced degenerative changes in the cervical spine. No acute osseous abnormality identified. Upper chest: Mild upper lung atelectasis. No superior mediastinal lymphadenopathy. Other neck: Negative, no acute findings in the neck. Aortic arch: 3 vessel arch configuration. Mild to moderate arch atherosclerosis, with mostly soft plaque in the distal arch. Right carotid system: No brachiocephalic or right CCA origin stenosis despite some plaque. No stenosis proximal to the bifurcation, but there is bulky soft plaque or thrombus throughout the right ICA origin and bulb resulting in critical radiographic string sign stenosis on series 9, image 51. Superimposed additional bulky calcified plaque distal to the bulb also resulting in tandem string sign stenoses (series 5, image 105 and series 9 image 50). Despite this the vessel remains patent although is asymmetrically smaller to the skull base (about 3 mm diameter). Left carotid system: No left CCA origin stenosis despite plaque. At the left carotid bifurcation the left ICA origin is patent, but at the distal bulb there is bulky calcified more so than soft plaque resulting in severe stenosis at or approaching a radiographic string sign  on series 5, image 103. The vessel remains patent and has a fairly normal caliber distal to this region to the skull base. Vertebral arteries: Calcified plaque in the proximal right subclavian artery with less than 50 % stenosis with respect to the distal vessel. Minor calcified plaque at the right vertebral artery origin without stenosis. However, there is bulky soft plaque in the right V1 segment resulting in moderate to severe stenosis on series 7, image 272. Distal to this the right vertebral caliber remains normal, and the vessel is then patent to the skull base without additional stenosis. Soft and calcified plaque plus tortuosity of the proximal left subclavian artery resulting in a kinked appearance and moderate stenosis as seen on series 9, image 109. Superimposed severe stenosis at the left vertebral artery origin due to soft plaque on series 8, image 133. The left vertebral is fairly codominant and then patent to the skull base without additional stenosis. CTA HEAD Posterior circulation: Non dominant left vertebral artery with mild calcified plaque does remain patent to the vertebrobasilar junction without stenosis. Moderate right V4 segment calcification with mild stenosis. Patent vertebrobasilar junction and basilar artery without stenosis. SCA and PCA origins are patent. Posterior communicating arteries are diminutive or absent. There is mild bilateral PCA irregularity and stenosis. Anterior circulation: Both ICA siphons are patent. On the left there is mild to moderate calcified plaque without stenosis. Normal left ophthalmic artery origin. On the right there is mild calcified plaque without stenosis. Normal right ophthalmic artery origin.  Patent carotid termini. Patent bilateral MCA and left ACA origins. The left A1 is dominant and the right diminutive or absent. Anterior communicating artery and bilateral ACA branches are within normal limits. Left MCA M1 segment and bifurcation are patent without  stenosis. Right MCA proximal M1 segment is patent but becomes near occluded more distally corresponding to the hyperdense segment by plain CT. The mid and distal right M1 segment is affected (series 11, image 20 and series 10, image 19) with intermediate to poor reconstitution of right M2 and M3 branches (series 12, image 9). Venous sinuses: Early contrast timing, grossly patent. Anatomic variants: Mildly dominant right vertebral artery. Review of the MIP images confirms the above findings IMPRESSION: 1. Positive for Right MCA Emergent Large Vessel Occlusion affecting the mid and distal M1 segment. 2. Positive also for superimposed Critical Stenosis cervical Right ICA beginning at the origin, throughout the proximal 3.5 cm of the vessel. There are tandem RADIOGRAPHIC STRING SIGN STENOSES. 3. The above was discussed by telephone with to Dr. [name] at 1338 hours on 03/16/2019. 4. Additional severe atherosclerosis and multifocal high-grade arterial stenoses elsewhere: - Left ICA distal bulb RADIOGRAPHIC STRING SIGN STENOSIS. - Severe bilateral Vertebral Artery origin stenoses. - proximal Left Subclavian Artery moderate stenosis due to plaque and tortuosity. 5.  Aortic Atherosclerosis (ICD10-I70.0). Electronically Signed   By: Genevie Ann M.D.   On: 04/08/2019 14:16   MR BRAIN WO CONTRAST  Result Date: 03/23/2019 CLINICAL DATA:  Stroke, follow-up. EXAM: MRI HEAD WITHOUT CONTRAST TECHNIQUE: Multiplanar, multiecho pulse sequences of the brain and surrounding structures were obtained without intravenous contrast. COMPARISON:  Noncontrast CT head, CT angiogram head/neck and CT perfusion 03/29/2019 FINDINGS: Brain: There is an acute cortical/subcortical infarct within the anterolateral right temporal lobe measuring 3.1 x 1.5 cm in transaxial dimensions (series 5, image 67). There are numerous additional small scattered cortical and subcortical infarcts within the right cerebral hemisphere MCA vascular territory involving the  right frontal lobe, parietal lobe, occipital lobe and temporal lobe as well as right insula. There is no significant mass effect. No midline shift or extra-axial fluid collection. No chronic intracranial blood products. Moderate patchy T2/FLAIR hyperintensity within the cerebral white matter is nonspecific, but consistent with chronic small vessel ischemic disease. Moderate generalized parenchymal atrophy. Vascular: SWI signal loss in the region of proximal right M2 MCA branches (series 12, image 23) which may reflect residual thrombus. Skull and upper cervical spine: No focal marrow lesion. Incompletely assessed upper cervical spondylosis. Sinuses/Orbits: Visualized orbits demonstrate no acute abnormality. Paranasal sinus mucosal thickening. Most notably there is moderate mucosal thickening within bilateral ethmoid air cells. Left sphenoid sinus air-fluid level. Small bilateral mastoid effusions. IMPRESSION: 3.1 cm acute cortical/subcortical infarct within the anterolateral right temporal lobe. Numerous additional small scattered acute cortical and subcortical infarct within the right cerebral hemisphere MCA vascular territory. No significant mass effect or evidence of parenchymal hemorrhage. SWI signal loss in the region of proximal right M2 MCA branches which may reflect residual thrombus. Moderate generalized parenchymal atrophy and chronic small vessel ischemic disease. Paranasal sinus disease as described. Small bilateral mastoid effusions. Electronically Signed   By: Kellie Simmering DO   On: 03/23/2019 13:43   IR [place]  Result Date: 03/24/2019 INDICATION: New onset of slurred speech and left-sided weakness. Left-sided neglect. CT angiogram of the head and neck revealing a pre occlusive severe stenosis of the right internal carotid proximally with angiographic string sign. Large non occlusive filling defect in the right middle cerebral  artery distally extending into the inferior division proximally.  EXAM: 1. EMERGENT LARGE VESSEL OCCLUSION THROMBOLYSIS (anterior CIRCULATION) COMPARISON:  CT angiogram of the head and neck of March 22, 2019. MEDICATIONS: Ancef 2 g IV antibiotic was administered within 1 hour of the procedure. ANESTHESIA/SEDATION: General anesthesia. CONTRAST:  Omnipaque 300 approximately 120 mL. FLUOROSCOPY TIME:  Fluoroscopy Time: 76 minutes 48 seconds (1992 mGy). COMPLICATIONS: None immediate. TECHNIQUE: Following a full explanation of the procedure along with the potential associated complications, an informed witnessed consent was obtained from the wife. The risks of intracranial hemorrhage of 10%, worsening neurological deficit, ventilator dependency, death and inability to revascularize were all reviewed in detail with the patient's spouse. The patient was then put under general anesthesia by the Department of Anesthesiology at [facility]. The right groin was prepped and draped in the usual sterile fashion. Thereafter using modified Seldinger technique, transfemoral access into the right common femoral artery was obtained without difficulty. Over a 0.035 inch guidewire a 5 French Pinnacle sheath was inserted. Through this, and also over a 0.035 inch guidewire a 5 [place] JB 1 catheter was advanced to the aortic arch region and selectively positioned in the innominate artery and the right common carotid artery. FINDINGS: The innominate artery angiogram demonstrates the origin of the right subclavian artery and the right common carotid artery to be widely patent. There is mild narrowing of the origin of the right vertebral artery. The vessel is, otherwise, seen to opacify to the cranial skull base to the level of the right posterior-inferior cerebellar artery. The right common carotid arteriogram demonstrates the origin the right external carotid artery and its major branches to be widely patent. The right internal carotid artery at the bulb and just distally demonstrates severe pre  occlusive stenosis with an angiographic string sign. No focal area of severe stenosis also noted in the right internal carotid artery proximally. More distally there is decreased caliber of the right internal carotid artery to the cranial skull base. The left common carotid arteriogram demonstrates the left external carotid artery and its major branches to be widely patent. The right internal carotid artery at the bulb demonstrates a high-grade stenosis of approximately 75% in the lateral projection associated with a segmental atherosclerotic plaque. More distally the vessel is seen to opacify to the cranial skull base. The petrous, cavernous and the supraclinoid left ICA demonstrates wide patency. The left anterior and the left middle cerebral artery opacify into the capillary and venous phases. There is mild to moderate narrowing of the left anterior cerebral A1 segment. 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 Delayed arterial and capillary phases demonstrate opacification of the right anterior cerebral artery territory distal to the A2 segment. PROCEDURE: The diagnostic JB 1 catheter in the right common carotid artery was then exchanged over a 0.035 inch 300 cm Rosen exchange guidewire for an 8 [place] Pinnacle sheath in the right groin which was then connected to continuous heparinized saline infusion. The patient was loaded with aspirin 81 mg, and Brilinta 180 mg via an orogastric tube in anticipation for revascularization of the right internal carotid artery proximally. Over the exchange guidewire, an 087 balloon guide catheter which had been prepped with 50% contrast and 50% heparinized saline infusion was advanced and positioned just proximal to the right common carotid  bifurcation. The  guidewire was removed. Good aspiration was obtained from the hub of the balloon guide catheter. A gentle control arteriogram performed through this continued to demonstrate no change in the severely pre occlusive narrowing of the right internal carotid artery at the bulb, and also in the proximal right internal carotid artery. Over a 0.014 inch standard Synchro micro guidewire, an 021 Trevo ProVue microcatheter was advanced to the distal end of the balloon guide catheter. The micro guidewire was then gently manipulated using a torque device and advanced without difficulty to the cervical petrous junction. However, advancement of the 021 Trevo ProVue micro catheter was met with significant resistance at the upper tandem calcified lesion. This was then replaced instead with a 017 Headway 2 tip microcatheter which was again advanced over a 0.014 inch standard Synchro micro guidewire without difficulty to the cervical petrous junction. The micro guidewire was removed. Good aspiration obtained from the hub of the microcatheter. This in turn was then exchanged for an 014 inch softip Transend 300 cm exchange micro guidewire with a J configuration. Control arteriogram performed through the balloon guide catheter demonstrated improved flow through the right internal carotid artery proximally and distally. A 4 mm x 30 mm Viatrac 14 balloon angioplasty catheter was then prepped and purged with heparinized saline infusion. Using the rapid exchange technique this was then advanced and positioned without difficulty across the proximal right ICA stenosis. Control inflation was then carried out using micro inflation syringe device via micro tubing to the normal pressure where it was maintained for approximately 20 seconds. At this time, there was significant bradycardia noted. This prompted immediate deflation and retrieval of the balloon whilst the exchange micro guidewire was kept and positioned in the petrous segment of the  right internal carotid artery. A control arteriogram performed through the balloon guide catheter in the right common carotid artery now demonstrated significantly improved caliber and flow through the proximally angioplastied segment of the right internal carotid artery now revealing the significant stenosis distal to this involving the calcified plaque. More distally, free flow was seen in the right internal carotid artery to the cranial skull base in the petrous cavernous and supraclinoid segments. The right middle cerebral artery distribution was noted to be widely patent with no evidence of filling defect in the right middle cerebral artery over its distal territories. A TICI 3 revascularization had been achieved. There was no visualization of the right anterior cerebral artery A1 segment. This was also noted to be on the CT angiogram of the head and neck performed earlier. Measurements were then performed of the right internal carotid artery in the most normal appearance angiographically distal to the proximal angioplasty segment, and also the right common carotid artery. It was decided to proceed with placement of a 6/8 mm x 40 mm Xact stent. The delivery catheter of the stent was then prepped and purged with heparinized saline infusion retrogradely. Again using the rapid exchange technique, this was then advanced without difficulty to the right common carotid bifurcation. The distal portion of the stent was positioned such that it was proximal to the more tandem heavily calcified lesion. This stent was then deployed without difficulty. The delivery system was then retrieved and removed. A control arteriogram performed through the balloon guide catheter in the right common carotid artery now demonstrates significantly improved caliber and flow through the stented segment and also through the internal carotid artery proximally and distally. There continued to be the calcified significant stenosis involving the  proximal right ICA as mentioned earlier. Measurements were performed of the right internal carotid artery in the C1-C2 junction, and also aspirate was the noted in the right internal carotid artery at the proximal aspect of the initial stent. Over the exchange micro guidewire, a 4 mm x 22 mm Resolute Onyx drug-eluting balloon mounted stent was prepped and purged with heparinized saline infusion and also with 50% contrast and 50% heparinized saline infusion. This was then advanced again using the rapid exchange technique without difficulty into the distal portion of the initial stent. However, this stent could not be advanced into the C1-C2 junction on account of the severe calcific plaque. This is most likely related to calcified ledge protruding intraluminally. After multiple attempts, the balloon mounted stent delivery system was retrieved and removed. It was, therefore, decided to proceed with balloon angioplasty. A TREK 3 mm x 12 mm coronary angioplasty balloon catheter was prepped and purged with heparinized saline infusion. Using the rapid exchange technique, this was advanced without difficulty and positioned in the distal and proximal markers at the site of the severe high-grade stenosis. A control angioplasty was then performed again with a micro inflation syringe device via micro tubing. This was carried to 6 atmospheres where it was maintained for approximately 90 seconds. Upon deflation and proximal retrieval, a control arteriogram performed through the balloon guide catheter in the right common carotid artery now demonstrated significantly improved caliber and flow through the angioplastied segment also distally. It was elected to try advancement of the previously Resolute Onyx 4 x 22 mm balloon mounted stent in view of the improved caliber following the angioplasty. At this time the stent delivery catheter was advanced more distally. However, further advancement was again met middle with significant  resistance secondary to calcified plaque. Again after multiple trials, this was abandoned. Control arteriogram performed through the balloon guide catheter in the right common carotid artery continued to demonstrate significantly improved caliber and flow through the angioplastied segment of the right internal carotid artery. There was approximately 80% patency noted at the site of the angioplasty. 10 minute control arteriogram performed through the balloon guide catheter continued to demonstrate significantly improved caliber and flow through the angioplastied segment. A control arteriogram performed intracranially continued to demonstrate a completely revascularized right middle cerebral artery distribution. An anterior cerebral artery A1 segment again was visualized. The balloon guide was then retrieved and removed. The 8 French sheath was left in place and connected to continuous heparinized saline infusion. No evidence of hematoma or bleeding was seen. The distal pulses remained Dopplerable in the dorsalis pedis, and the posterior tibial regions bilaterally unchanged. The patient was also left intubated on account of the IV tPA the patient had had, in the large-bore Pinnacle sheath. The patient was also given approximately 7.5 mg of Integrilin during the course of the treatment involving the right ICA angioplasty tandem lesion. Additionally it was decided to infuse Aggrastat over approximately 12 hours in order to obviate the potential for acute platelet aggregation at the site of the angioplasty, and also the stent. A flat panel CT of the brain demonstrated no evidence of gross midline shift or of hemorrhage. The patient was then transferred to the neuro ICU to continue with post revascularization management. IMPRESSION: Complete revascularization of previously noted large filling defect in the right middle cerebral artery distally extending into the proximal inferior division following proximal  revascularization, and use of approximately 7.5 mg with a TICI 3 revascularization of the right middle cerebral artery  distribution. Status post endovascular revascularization of symptomatic near complete occlusion of the right internal carotid artery proximally with a string sign with placement of stent following balloon angioplasty as described. Status post endovascular balloon angioplasty of significant stenosis secondary to a calcified atherosclerotic plaque involving the proximal and middle 1/3 of the right internal carotid artery with patency of 80%. Non visualization of the right anterior cerebral artery A1 segment. Opacification of the right anterior cerebral artery at the level of the A2 segment from a communication with the left anterior cerebral artery A2 segment. 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 PLAN: Follow-up in the clinic 4 weeks post discharge. Electronically Signed   By: Luanne Bras M.D.   On: 03/23/2019 15:13   DG Chest Port 1 View  Result Date: 03/24/2019 CLINICAL DATA:  ET tube placed EXAM: PORTABLE CHEST 1 VIEW COMPARISON:  04/05/2019 FINDINGS: Endotracheal tube with the tip 3.6 cm above the carina. Nasogastric tube coursing below the diaphragm projecting over the stomach. Mild right basilar airspace disease. No pleural effusion or pneumothorax. Stable cardiomediastinal silhouette. No aggressive osseous lesion. IMPRESSION: ET tube with the tip 3.6 cm above the carina. Right lower lobe hazy airspace disease which may reflect atelectasis versus pneumonia. Electronically Signed   By: Kathreen Devoid   On: 03/24/2019 08:47   DG Chest Port 1 View  Result Date: 03/24/2019 CLINICAL DATA:  Respiratory failure. EXAM: PORTABLE CHEST 1 VIEW COMPARISON:  October 09, 2013 FINDINGS: An endotracheal tube is seen with its distal tip approximately 3.7 cm from the carina. A nasogastric tube is noted with its distal end overlying the body  of the stomach. Very mild atelectasis and/or infiltrate is seen along the medial aspect of the left lung base. There is no evidence of a pleural effusion or pneumothorax. There is moderate to marked severity enlargement of the cardiac silhouette. The visualized skeletal structures are unremarkable. IMPRESSION: 1. Endotracheal tube and nasogastric tube in good position. 2. Moderate to marked severity enlargement of the cardiac silhouette. 3. Mild left basilar atelectasis and/or infiltrate. Electronically Signed   By: Virgina Norfolk M.D.   On: 04/04/2019 19:05   ECHOCARDIOGRAM COMPLETE  Result Date: 03/23/2019    ECHOCARDIOGRAM REPORT   Patient Name:   Jonathan Berger Date of Exam: 03/23/2019 Medical Rec #:  [id number]        Height:       71.0 in Accession #:    [id number]       Weight:       189.2 lb Date of Birth:  02-12-1932        BSA:          2.06 m Patient Age:    18 years         BP:           112/66 mmHg Patient Gender: M                HR:           55 bpm. Exam Location:  Inpatient Procedure: 2D Echo, Cardiac Doppler and Color Doppler Indications:    CVA  History:        Patient has no prior history of Echocardiogram examinations.                 Stroke, Arrythmias:Bradycardia; Risk Factors:Hypertension.  Sonographer:    Dustin Flock Referring Phys: [id number] ERIC LINDZEN  Sonographer Comments: Echo performed with patient  supine and on artificial respirator. Image acquisition challenging due to respiratory motion. IMPRESSIONS  1. Left ventricular ejection fraction, by estimation, is 45%. The left ventricle has mildly decreased function. The left ventrical demonstrates regional wall motion abnormalities (see scoring diagram/findings for description). Akinesis of the mid to apical anteroseptal and inferoseptal walls. Left ventricular diastolic parameters are consistent with Grade I diastolic dysfunction (impaired relaxation).  2. Right ventricular systolic function is mildly reduced. The right  ventricular size is mildly enlarged. There is mildly elevated pulmonary artery systolic pressure. The estimated right ventricular systolic pressure is 74.9 mmHg.  3. The aortic valve is tricuspid. Trivial aortic insufficiency, no aortic stenosis.  4. No significant mitral regurgitation.  5. Dilated IVC with estimated RA pressure 15 mmHg. FINDINGS  Left Ventricle: Left ventricular ejection fraction, by estimation, is 45%. The left ventricle has mildly decreased function. The left ventricle demonstrates regional wall motion abnormalities. The left ventricular internal cavity size was normal in size. There is mildly increased left ventricular hypertrophy. Right Ventricle: The right ventricular size is mildly enlarged. No increase in right ventricular wall thickness. Right ventricular systolic function is mildly reduced. There is mildly elevated pulmonary artery systolic pressure. The tricuspid regurgitant  velocity is 2.54 m/s, and with an assumed right atrial pressure of 8 mmHg, the estimated right ventricular systolic pressure is 44.9 mmHg. Left Atrium: Left atrial size was normal in size. Right Atrium: Right atrial size was normal in size. Pericardium: There is no evidence of pericardial effusion. Mitral Valve: The mitral valve is normal in structure and function. No evidence of mitral valve regurgitation. No evidence of mitral valve stenosis. Tricuspid Valve: The tricuspid valve is normal in structure. Tricuspid valve regurgitation is mild. Aortic Valve: The aortic valve is tricuspid. Aortic valve regurgitation is trivial. Mild aortic valve sclerosis is present, with no evidence of aortic valve stenosis. Pulmonic Valve: The pulmonic valve was normal in structure. Pulmonic valve regurgitation is not visualized. Aorta: The aortic root and ascending aorta are structurally normal, with no evidence of dilitation. Venous: The inferior vena cava is dilated in size with less than 50% respiratory variability, suggesting  right atrial pressure of 15 mmHg. IAS/Shunts: No atrial level shunt detected by color flow Doppler.  LEFT VENTRICLE PLAX 2D LVIDd:         4.63 cm  Diastology LVIDs:         3.40 cm  LV e' lateral:   4.35 cm/s LV PW:         1.37 cm  LV E/e' lateral: 10.5 LV IVS:        1.36 cm  LV e' medial:    4.24 cm/s LVOT diam:     2.20 cm  LV E/e' medial:  10.8 LV SV:         62.72 ml LV SV Index:   24.65 LVOT Area:     3.80 cm  RIGHT VENTRICLE RV Basal diam:  3.02 cm RV S prime:     5.87 cm/s TAPSE (M-mode): 3.3 cm LEFT ATRIUM           Index       RIGHT ATRIUM           Index LA diam:      4.30 cm 2.09 cm/m  RA Area:     17.80 cm LA Vol (A4C): 49.5 ml 24.04 ml/m RA Volume:   45.40 ml  22.05 ml/m  AORTIC VALVE LVOT Vmax:   75.00 cm/s LVOT Vmean:  51.800 cm/s  LVOT VTI:    0.165 m  AORTA Ao Root diam: 2.90 cm MITRAL VALVE                        TRICUSPID VALVE MV Area (PHT): 2.16 cm             TR Peak grad:   25.8 mmHg MV Decel Time: 352 msec             TR Vmax:        254.00 cm/s MV E velocity: 45.80 cm/s 103 cm/s MV A velocity: 62.10 cm/s 70.3 cm/s SHUNTS MV E/A ratio:  0.74       1.5       Systemic VTI:  0.16 m                                     Systemic Diam: 2.20 cm Loralie Champagne MD Electronically signed by Loralie Champagne MD Signature Date/Time: 03/23/2019/3:23:23 PM    Final    CT HEAD CODE STROKE WO CONTRAST  Result Date: 03/27/2019 CLINICAL DATA:  Code stroke. 86 year old male with left facial droop and aphasia. EXAM: CT HEAD WITHOUT CONTRAST TECHNIQUE: Contiguous axial images were obtained from the base of the skull through the vertex without intravenous contrast. COMPARISON:  Head CT 12/25/2016. FINDINGS: Brain: Cerebral volume is not significantly changed since 2018. No midline shift, mass effect, or evidence of intracranial mass lesion. No ventriculomegaly. No acute intracranial hemorrhage identified. Scattered bilateral cerebral white matter hypodensity has increased. The deep gray nuclei, brainstem and  cerebellum remain within normal limits. No cortically based acute infarct identified. Vascular: Calcified atherosclerosis at the skull base. There is asymmetric right MCA M1 or proximal M2 hyperdensity best seen on series 5, image 29 which is new from 2018. Skull: Negative. Sinuses/Orbits: Visualized paranasal sinuses and mastoids are stable and well pneumatized. Other: Negative orbit and scalp soft tissues. ASPECTS [facility] Stroke Program Early CT Score) Total score (0-10 with 10 being normal): 10 IMPRESSION: 1. Hyperdense Right MCA suspicious for emergent large vessel occlusion. But no CT changes of acute cortically based infarct identified. ASPECTS 10. 2. No acute intracranial hemorrhage. Increased scattered white matter hypodensity since 2018. 3. These results were communicated to Dr. [name] at 1:14 pmon 02/15/2021by text page via the [facility] messaging system. Electronically Signed   By: Genevie Ann M.D.   On: 03/26/2019 13:16   [place] EKG SITE RITE  Result Date: 03/24/2019 If Site Rite image not attached, placement could not be confirmed due to current cardiac rhythm.   Labs:  CBC: Recent Labs    04/05/2019 1303 03/27/2019 1308 03/23/19 0303 03/23/19 1619 03/23/19 1640 03/24/19 0423  WBC 9.5  --  14.3*  --   --  11.4*  HGB 16.1   < > 13.6 12.6* 11.9* 12.8*  HCT 48.0   < > 41.1 38.5* 35.0* 39.5  PLT 145*  --  159  --   --  130*   < > = values in this interval not displayed.    COAGS: Recent Labs    03/28/2019 1303  INR 0.9  APTT 28    BMP: Recent Labs    03/16/2019 1303 03/29/2019 1303 03/19/2019 1308 03/23/19 0303 03/23/19 1640 03/24/19 0423  NA 136   < > 137 139 141 140  K 3.6   < > 3.6 3.2* 3.8 3.9  CL 102  --  102 105  --  111  CO2 25  --   --  20*  --  21*  GLUCOSE 160*  --  155* 154*  --  145*  BUN 22  --  24* 17  --  13  CALCIUM 10.3  --   --  9.0  --  8.4*  CREATININE 1.36*  --  1.30* 1.17  --  1.19  GFRNONAA 47*  --   --  56*  --  55*  GFRAA 54*  --   --  >60  --  >60   < > =  values in this interval not displayed.    LIVER FUNCTION TESTS: Recent Labs    04/04/2019 1303 03/24/19 0423  BILITOT 0.8  --   AST 19  --   ALT 15  --   ALKPHOS 54  --   PROT 6.6  --   ALBUMIN 3.5 2.5*    Assessment and Plan:  Acute CVA s/p cerebral arteriogram s/p emergent stent placement of proximal right ICA severe pre-occlusive stenosis along with balloon angioplasty of proximal/mid right ICA achieving a TICI 3 revascularization 03/31/2019 by Dr. Estanislado Pandy. Patient's condition stable- remains intubated/sedated, moves all extremities, follows some simple commands (can wiggle bilateral toes on command). Right groin incision stable, distal pulses 1+ bilaterally. Continue taking Brilinta 90 mg twice daily and Aspirin 81 mg once daily, will obtain P2Y12 today to check effectiveness of DAPT. Further plans per neurology/CCM- appreciate and agree with management. NIR to follow.   ADDENDUM: P2Y12 8 PRU today- per Dr. Estanislado Pandy continue taking Brilinta 90 mg twice daily and Aspirin 81 mg once daily.   Electronically Signed: Earley Abide, PA-C 03/24/2019, 8:59 AM   I spent a total of 25 Minutes at the the patient's bedside AND on the patient's hospital floor or unit, greater than 50% of which was counseling/coordinating care for proximal right ICA pre-occlusive stenosis s/p revascularization.

## 2019-03-24 NOTE — Progress Notes (Signed)
eLink Physician-Brief Progress Note Patient Name: Jonathan Berger DOB: [birth year] MRN: [id number]   Date of Service  03/24/2019  HPI/Events of Note  Pt was agitated but has now settled down.  eICU Interventions  No new orders for now.        Jonathan Berger 03/24/2019, 5:03 AM

## 2019-03-24 NOTE — Evaluation (Signed)
Physical Therapy Evaluation Patient Details Name: Jonathan Berger MRN: [id number] DOB: 1931-06-02 Today's Date: 03/24/2019   History of Present Illness  86 year old presenting 2/7 with slurred speech and dysphagia found to have acute right middle cerebral artery stroke s/p tPA and EVR R MCA and ICA with stent.  Clinical Impression  Performed limited in bed evaluation this date as RN concerned about LE's stiffness, but pt too unstable for EOB activity.  Feel he demonstrates R hip abductor tightness and possibly L LE extensor tone.  Feel he will benefit from skilled PT in the acute setting to prevent contractures, promote mobility and when able further assess and progress OOB activity.  Currently intubated and restrained with wife in the room.  He may benefit from CIR level rehab upon d/c depending on progress.      Follow Up Recommendations CIR    Equipment Recommendations  Other (comment)(TBA)    Recommendations for Other Services       Precautions / Restrictions Precautions Precautions: Fall Precaution Comments: Vent, restraints      Mobility  Bed Mobility               General bed mobility comments: deferred per RN due to agitation  Transfers                    Ambulation/Gait                Stairs            Wheelchair Mobility    Modified Rankin (Stroke Patients Only)       Balance                                             Pertinent Vitals/Pain Pain Assessment: Faces Faces Pain Scale: No hurt    Home Living Family/patient expects to be discharged to:: Private residence Living Arrangements: Spouse/significant other Available Help at Discharge: Family Type of Home: House Home Access: Stairs to enter Entrance Stairs-Rails: Doctor, general practice of Steps: 3-4 Home Layout: Two level;Bed/bath upstairs Home Equipment: Other (comment) Additional Comments: wife not sure of equipment, but thinks they  have some from prior surgeries    Prior Function Level of Independence: Independent               Hand Dominance   Dominant Hand: Left    Extremity/Trunk Assessment   Upper Extremity Assessment Upper Extremity Assessment: Difficult to assess due to impaired cognition(RN concerned due to agitation and arms restrained so deferred UE assessment)    Lower Extremity Assessment Lower Extremity Assessment: LLE deficits/detail;RLE deficits/detail RLE Deficits / Details: AAROM limited hip adduction and internal rotation.  noted tightness in IT band on R lateral thigh and pt resistive of movement of leg towards int rotation and adduction otherwise AAROM WFL, strength NT due to cognition LLE Deficits / Details: AAROM stiff throughout joints and ROM.  unsure if extensor tone versus pt resisting, but did improve with tone inhibition techniques, tight heel cords noted and stretch provided       Communication   Communication: Other (comment)(intubated)  Cognition Arousal/Alertness: Lethargic;Suspect due to medications Behavior During Therapy: Restless Overall Cognitive Status: Difficult to assess  General Comments      Exercises     Assessment/Plan    PT Assessment Patient needs continued PT services  PT Problem List Decreased range of motion;Decreased mobility;Decreased strength;Decreased cognition;Cardiopulmonary status limiting activity       PT Treatment Interventions DME instruction;Therapeutic activities;Balance training;Gait training;Functional mobility training;Therapeutic exercise;Patient/family education;Stair training    PT Goals (Current goals can be found in the Care Plan section)  Acute Rehab PT Goals Patient Stated Goal: per wife to return to independent PT Goal Formulation: With family Time For Goal Achievement: 04/07/19 Potential to Achieve Goals: Fair    Frequency Min 4X/week   Barriers to discharge         Co-evaluation               AM-PAC PT "6 Clicks" Mobility  Outcome Measure Help needed turning from your back to your side while in a flat bed without using bedrails?: Total Help needed moving from lying on your back to sitting on the side of a flat bed without using bedrails?: Total   Help needed standing up from a chair using your arms (e.g., wheelchair or bedside chair)?: Total Help needed to walk in hospital room?: Total Help needed climbing 3-5 steps with a railing? : Total 6 Click Score: 5    End of Session   Activity Tolerance: Treatment limited secondary to agitation Patient left: in bed;with restraints reapplied;with nursing/sitter in room;with family/visitor present   PT Visit Diagnosis: Other abnormalities of gait and mobility (R26.89);Muscle weakness (generalized) (M62.81);Other symptoms and signs involving the nervous system (R29.898)    Time: 9935-7017 PT Time Calculation (min) (ACUTE ONLY): 15 min   Charges:   PT Evaluation $PT Eval Moderate Complexity: [place], [place] Acute Rehabilitation Services [phone] 03/24/2019   Reginia Naas 03/24/2019, 1:31 PM

## 2019-03-24 NOTE — Plan of Care (Signed)
  Problem: Education: Goal: Knowledge of disease or condition will improve Outcome: Progressing Goal: Knowledge of secondary prevention will improve Outcome: Progressing   Problem: Nutrition: Goal: Dietary intake will improve Outcome: Progressing   Problem: Nutrition: Goal: Adequate nutrition will be maintained Outcome: Progressing   Problem: Pain Managment: Goal: General experience of comfort will improve Outcome: Progressing   Problem: Coping: Goal: Will identify appropriate support needs Outcome: Not Progressing

## 2019-03-24 NOTE — Progress Notes (Signed)
Peripherally Inserted Central Catheter/Midline Placement  The IV Nurse has discussed with the patient and/or persons authorized to consent for the patient, the purpose of this procedure and the potential benefits and risks involved with this procedure.  The benefits include less needle sticks, lab draws from the catheter, and the patient may be discharged home with the catheter. Risks include, but not limited to, infection, bleeding, blood clot (thrombus formation), and puncture of an artery; nerve damage and irregular heartbeat and possibility to perform a PICC exchange if needed/ordered by physician.  Alternatives to this procedure were also discussed.  Bard Power PICC patient education guide, fact sheet on infection prevention and patient information card has been provided to patient /or left at bedside.    PICC/Midline Placement Documentation  PICC Double Lumen 03/24/19 PICC Right Basilic 44 cm 1 cm (Active)  Indication for Insertion or Continuance of Line Vasoactive infusions 03/24/19 1129  Exposed Catheter (cm) 1 cm 03/24/19 1129  Site Assessment Clean;Dry;Intact 03/24/19 1129  Lumen #1 Status Flushed;Blood return noted 03/24/19 1129  Lumen #2 Status Flushed;Blood return noted 03/24/19 1129  Dressing Type Transparent 03/24/19 1129  Dressing Status Clean;Dry;Intact;Antimicrobial disc in place;Other (Comment) 03/24/19 1129  Dressing Intervention New dressing 03/24/19 1129  Dressing Change Due 03/31/19 03/24/19 1129   Telephone consent by wife    Jonathan Berger 03/24/2019, 11:29 AM

## 2019-03-24 NOTE — Progress Notes (Signed)
OT Cancellation Note  Patient Details Name: Jonathan Berger MRN: [id number] DOB: Dec 07, 1931   Cancelled Treatment:    Reason Eval/Treat Not Completed: Patient not medically ready(Pt having lung issues and not ready for mobility)  OT to continue to follow-up when pt is medically appropriate.  Flora Lipps, OTR/L Acute Rehabilitation Services Pager: [phone] Office: [phone]   Lonzo Cloud 03/24/2019, 12:05 PM

## 2019-03-24 NOTE — Progress Notes (Addendum)
STROKE TEAM PROGRESS NOTE   INTERVAL HISTORY RN and CCM NP at bedside. Pt still intubated on sedation. As per RN, pt are sensitive to sedation. Will less sedation, pt was agitated and trying to get out of bed. With a little more sedation, pt was quite and not doing much. Currently, pt lying in bed, eyes closed and not following commands. Pt still has secretions and CXR concerning for RLL aspiration.   Vitals:   03/24/19 0645 03/24/19 0700 03/24/19 0715 03/24/19 0800  BP: (!) 155/67 (!) 153/79  (!) 166/81  Pulse: (!) 55 (!) 57 62   Resp: _0 Temp:    99.2 F (37.3 C)  TempSrc:    Axillary  SpO2: 95% 92% 91%   Weight:      Height:        CBC:  Recent Labs  Lab 04/09/2019 1303 03/30/2019 1308 03/23/19 0303 03/23/19 1619 03/23/19 1640 03/24/19 0423  WBC 9.5   < > 14.3*  --   --  11.4*  NEUTROABS 5.0  --  10.3*  --   --   --   HGB 16.1   < > 13.6   < > 11.9* 12.8*  HCT 48.0   < > 41.1   < > 35.0* 39.5  MCV 97.8   < > 99.3  --   --  102.1*  PLT 145*   < > 159  --   --  130*   < > = values in this interval not displayed.    Basic Metabolic Panel:  Recent Labs  Lab 03/23/19 0303 03/23/19 0303 03/23/19 1640 03/24/19 0423  NA 139   < > 141 140  K 3.2*   < > 3.8 3.9  CL 105  --   --  111  CO2 20*  --   --  21*  GLUCOSE 154*  --   --  145*  BUN 17  --   --  13  CREATININE 1.17  --   --  1.19  CALCIUM 9.0  --   --  8.4*  MG 1.7  --   --  2.0  PHOS  --   --   --  1.9*   < > = values in this interval not displayed.   Lipid Panel:     Component Value Date/Time   CHOL 154 03/23/2019 0303   TRIG 133 03/24/2019 0423   HDL 36 (L) 03/23/2019 0303   CHOLHDL 4.3 03/23/2019 0303   VLDL 38 03/23/2019 0303   LDLCALC 80 03/23/2019 0303   HgbA1c:  Lab Results  Component Value Date   HGBA1C 5.9 (H) 03/23/2019   Urine Drug Screen: No results found for: LABOPIA, COCAINSCRNUR, LABBENZ, AMPHETMU, THCU, LABBARB  Alcohol Level No results found for: ETH  IMAGING past 48  hours CT Code Stroke CTA Head W/WO contrast  Result Date: 03/29/2019 CLINICAL DATA:  86 year old male code stroke presentation with hyperdense right MCA on plain head CT. EXAM: CT ANGIOGRAPHY HEAD AND NECK TECHNIQUE: Multidetector CT imaging of the head and neck was performed using the standard protocol during bolus administration of intravenous contrast. Multiplanar CT image reconstructions and MIPs were obtained to evaluate the vascular anatomy. Carotid stenosis measurements (when applicable) are obtained utilizing NASCET criteria, using the distal internal carotid diameter as the denominator. CONTRAST:  44m OMNIPAQUE IOHEXOL 350 MG/ML SOLN COMPARISON:  Plain head CT 1306 hours today. FINDINGS: CTA NECK Skeleton: Absent maxillary dentition. Advanced degenerative changes in the  cervical spine. No acute osseous abnormality identified. Upper chest: Mild upper lung atelectasis. No superior mediastinal lymphadenopathy. Other neck: Negative, no acute findings in the neck. Aortic arch: 3 vessel arch configuration. Mild to moderate arch atherosclerosis, with mostly soft plaque in the distal arch. Right carotid system: No brachiocephalic or right CCA origin stenosis despite some plaque. No stenosis proximal to the bifurcation, but there is bulky soft plaque or thrombus throughout the right ICA origin and bulb resulting in critical radiographic string sign stenosis on series 9, image 51. Superimposed additional bulky calcified plaque distal to the bulb also resulting in tandem string sign stenoses (series 5, image 105 and series 9 image 50). Despite this the vessel remains patent although is asymmetrically smaller to the skull base (about 3 mm diameter). Left carotid system: No left CCA origin stenosis despite plaque. At the left carotid bifurcation the left ICA origin is patent, but at the distal bulb there is bulky calcified more so than soft plaque resulting in severe stenosis at or approaching a radiographic string  sign on series 5, image 103. The vessel remains patent and has a fairly normal caliber distal to this region to the skull base. Vertebral arteries: Calcified plaque in the proximal right subclavian artery with less than 50 % stenosis with respect to the distal vessel. Minor calcified plaque at the right vertebral artery origin without stenosis. However, there is bulky soft plaque in the right V1 segment resulting in moderate to severe stenosis on series 7, image 272. Distal to this the right vertebral caliber remains normal, and the vessel is then patent to the skull base without additional stenosis. Soft and calcified plaque plus tortuosity of the proximal left subclavian artery resulting in a kinked appearance and moderate stenosis as seen on series 9, image 109. Superimposed severe stenosis at the left vertebral artery origin due to soft plaque on series 8, image 133. The left vertebral is fairly codominant and then patent to the skull base without additional stenosis. CTA HEAD Posterior circulation: Non dominant left vertebral artery with mild calcified plaque does remain patent to the vertebrobasilar junction without stenosis. Moderate right V4 segment calcification with mild stenosis. Patent vertebrobasilar junction and basilar artery without stenosis. SCA and PCA origins are patent. Posterior communicating arteries are diminutive or absent. There is mild bilateral PCA irregularity and stenosis. Anterior circulation: Both ICA siphons are patent. On the left there is mild to moderate calcified plaque without stenosis. Normal left ophthalmic artery origin. On the right there is mild calcified plaque without stenosis. Normal right ophthalmic artery origin. Patent carotid termini. Patent bilateral MCA and left ACA origins. The left A1 is dominant and the right diminutive or absent. Anterior communicating artery and bilateral ACA branches are within normal limits. Left MCA M1 segment and bifurcation are patent  without stenosis. Right MCA proximal M1 segment is patent but becomes near occluded more distally corresponding to the hyperdense segment by plain CT. The mid and distal right M1 segment is affected (series 11, image 20 and series 10, image 19) with intermediate to poor reconstitution of right M2 and M3 branches (series 12, image 9). Venous sinuses: Early contrast timing, grossly patent. Anatomic variants: Mildly dominant right vertebral artery. Review of the MIP images confirms the above findings IMPRESSION: 1. Positive for Right MCA Emergent Large Vessel Occlusion affecting the mid and distal M1 segment. 2. Positive also for superimposed Critical Stenosis cervical Right ICA beginning at the origin, throughout the proximal 3.5 cm of the vessel. There  are tandem RADIOGRAPHIC STRING SIGN STENOSES. 3. The above was discussed by telephone with to Dr. [name] at 1338 hours on 03/29/2019. 4. Additional severe atherosclerosis and multifocal high-grade arterial stenoses elsewhere: - Left ICA distal bulb RADIOGRAPHIC STRING SIGN STENOSIS. - Severe bilateral Vertebral Artery origin stenoses. - proximal Left Subclavian Artery moderate stenosis due to plaque and tortuosity. 5.  Aortic Atherosclerosis (ICD10-I70.0). Electronically Signed   By: Genevie Ann M.D.   On: 03/30/2019 14:16   CT Code Stroke CTA Neck W/WO contrast  Result Date: 03/28/2019 CLINICAL DATA:  86 year old male code stroke presentation with hyperdense right MCA on plain head CT. EXAM: CT ANGIOGRAPHY HEAD AND NECK TECHNIQUE: Multidetector CT imaging of the head and neck was performed using the standard protocol during bolus administration of intravenous contrast. Multiplanar CT image reconstructions and MIPs were obtained to evaluate the vascular anatomy. Carotid stenosis measurements (when applicable) are obtained utilizing NASCET criteria, using the distal internal carotid diameter as the denominator. CONTRAST:  35m OMNIPAQUE IOHEXOL 350 MG/ML SOLN COMPARISON:   Plain head CT 1306 hours today. FINDINGS: CTA NECK Skeleton: Absent maxillary dentition. Advanced degenerative changes in the cervical spine. No acute osseous abnormality identified. Upper chest: Mild upper lung atelectasis. No superior mediastinal lymphadenopathy. Other neck: Negative, no acute findings in the neck. Aortic arch: 3 vessel arch configuration. Mild to moderate arch atherosclerosis, with mostly soft plaque in the distal arch. Right carotid system: No brachiocephalic or right CCA origin stenosis despite some plaque. No stenosis proximal to the bifurcation, but there is bulky soft plaque or thrombus throughout the right ICA origin and bulb resulting in critical radiographic string sign stenosis on series 9, image 51. Superimposed additional bulky calcified plaque distal to the bulb also resulting in tandem string sign stenoses (series 5, image 105 and series 9 image 50). Despite this the vessel remains patent although is asymmetrically smaller to the skull base (about 3 mm diameter). Left carotid system: No left CCA origin stenosis despite plaque. At the left carotid bifurcation the left ICA origin is patent, but at the distal bulb there is bulky calcified more so than soft plaque resulting in severe stenosis at or approaching a radiographic string sign on series 5, image 103. The vessel remains patent and has a fairly normal caliber distal to this region to the skull base. Vertebral arteries: Calcified plaque in the proximal right subclavian artery with less than 50 % stenosis with respect to the distal vessel. Minor calcified plaque at the right vertebral artery origin without stenosis. However, there is bulky soft plaque in the right V1 segment resulting in moderate to severe stenosis on series 7, image 272. Distal to this the right vertebral caliber remains normal, and the vessel is then patent to the skull base without additional stenosis. Soft and calcified plaque plus tortuosity of the proximal  left subclavian artery resulting in a kinked appearance and moderate stenosis as seen on series 9, image 109. Superimposed severe stenosis at the left vertebral artery origin due to soft plaque on series 8, image 133. The left vertebral is fairly codominant and then patent to the skull base without additional stenosis. CTA HEAD Posterior circulation: Non dominant left vertebral artery with mild calcified plaque does remain patent to the vertebrobasilar junction without stenosis. Moderate right V4 segment calcification with mild stenosis. Patent vertebrobasilar junction and basilar artery without stenosis. SCA and PCA origins are patent. Posterior communicating arteries are diminutive or absent. There is mild bilateral PCA irregularity and stenosis. Anterior circulation: Both  ICA siphons are patent. On the left there is mild to moderate calcified plaque without stenosis. Normal left ophthalmic artery origin. On the right there is mild calcified plaque without stenosis. Normal right ophthalmic artery origin. Patent carotid termini. Patent bilateral MCA and left ACA origins. The left A1 is dominant and the right diminutive or absent. Anterior communicating artery and bilateral ACA branches are within normal limits. Left MCA M1 segment and bifurcation are patent without stenosis. Right MCA proximal M1 segment is patent but becomes near occluded more distally corresponding to the hyperdense segment by plain CT. The mid and distal right M1 segment is affected (series 11, image 20 and series 10, image 19) with intermediate to poor reconstitution of right M2 and M3 branches (series 12, image 9). Venous sinuses: Early contrast timing, grossly patent. Anatomic variants: Mildly dominant right vertebral artery. Review of the MIP images confirms the above findings IMPRESSION: 1. Positive for Right MCA Emergent Large Vessel Occlusion affecting the mid and distal M1 segment. 2. Positive also for superimposed Critical Stenosis  cervical Right ICA beginning at the origin, throughout the proximal 3.5 cm of the vessel. There are tandem RADIOGRAPHIC STRING SIGN STENOSES. 3. The above was discussed by telephone with to Dr. [name] at 1338 hours on 04/02/2019. 4. Additional severe atherosclerosis and multifocal high-grade arterial stenoses elsewhere: - Left ICA distal bulb RADIOGRAPHIC STRING SIGN STENOSIS. - Severe bilateral Vertebral Artery origin stenoses. - proximal Left Subclavian Artery moderate stenosis due to plaque and tortuosity. 5.  Aortic Atherosclerosis (ICD10-I70.0). Electronically Signed   By: Genevie Ann M.D.   On: 03/27/2019 14:16   MR BRAIN WO CONTRAST  Result Date: 03/23/2019 CLINICAL DATA:  Stroke, follow-up. EXAM: MRI HEAD WITHOUT CONTRAST TECHNIQUE: Multiplanar, multiecho pulse sequences of the brain and surrounding structures were obtained without intravenous contrast. COMPARISON:  Noncontrast CT head, CT angiogram head/neck and CT perfusion 03/21/2019 FINDINGS: Brain: There is an acute cortical/subcortical infarct within the anterolateral right temporal lobe measuring 3.1 x 1.5 cm in transaxial dimensions (series 5, image 67). There are numerous additional small scattered cortical and subcortical infarcts within the right cerebral hemisphere MCA vascular territory involving the right frontal lobe, parietal lobe, occipital lobe and temporal lobe as well as right insula. There is no significant mass effect. No midline shift or extra-axial fluid collection. No chronic intracranial blood products. Moderate patchy T2/FLAIR hyperintensity within the cerebral white matter is nonspecific, but consistent with chronic small vessel ischemic disease. Moderate generalized parenchymal atrophy. Vascular: SWI signal loss in the region of proximal right M2 MCA branches (series 12, image 23) which may reflect residual thrombus. Skull and upper cervical spine: No focal marrow lesion. Incompletely assessed upper cervical spondylosis.  Sinuses/Orbits: Visualized orbits demonstrate no acute abnormality. Paranasal sinus mucosal thickening. Most notably there is moderate mucosal thickening within bilateral ethmoid air cells. Left sphenoid sinus air-fluid level. Small bilateral mastoid effusions. IMPRESSION: 3.1 cm acute cortical/subcortical infarct within the anterolateral right temporal lobe. Numerous additional small scattered acute cortical and subcortical infarct within the right cerebral hemisphere MCA vascular territory. No significant mass effect or evidence of parenchymal hemorrhage. SWI signal loss in the region of proximal right M2 MCA branches which may reflect residual thrombus. Moderate generalized parenchymal atrophy and chronic small vessel ischemic disease. Paranasal sinus disease as described. Small bilateral mastoid effusions. Electronically Signed   By: Kellie Simmering DO   On: 03/23/2019 13:43   IR CT Head Ltd  Result Date: 03/24/2019 INDICATION: New onset of slurred speech and left-sided  weakness. Left-sided neglect. CT angiogram of the head and neck revealing a pre occlusive severe stenosis of the right internal carotid proximally with angiographic string sign. Large non occlusive filling defect in the right middle cerebral artery distally extending into the inferior division proximally. EXAM: 1. EMERGENT LARGE VESSEL OCCLUSION THROMBOLYSIS (anterior CIRCULATION) COMPARISON:  CT angiogram of the head and neck of March 22, 2019. MEDICATIONS: Ancef 2 g IV antibiotic was administered within 1 hour of the procedure. ANESTHESIA/SEDATION: General anesthesia. CONTRAST:  Omnipaque 300 approximately 120 mL. FLUOROSCOPY TIME:  Fluoroscopy Time: 76 minutes 48 seconds (1992 mGy). COMPLICATIONS: None immediate. TECHNIQUE: Following a full explanation of the procedure along with the potential associated complications, an informed witnessed consent was obtained from the wife. The risks of intracranial hemorrhage of 10%, worsening  neurological deficit, ventilator dependency, death and inability to revascularize were all reviewed in detail with the patient's spouse. The patient was then put under general anesthesia by the Department of Anesthesiology at [facility]. The right groin was prepped and draped in the usual sterile fashion. Thereafter using modified Seldinger technique, transfemoral access into the right common femoral artery was obtained without difficulty. Over a 0.035 inch guidewire a 5 French Pinnacle sheath was inserted. Through this, and also over a 0.035 inch guidewire a 5 [place] JB 1 catheter was advanced to the aortic arch region and selectively positioned in the innominate artery and the right common carotid artery. FINDINGS: The innominate artery angiogram demonstrates the origin of the right subclavian artery and the right common carotid artery to be widely patent. There is mild narrowing of the origin of the right vertebral artery. The vessel is, otherwise, seen to opacify to the cranial skull base to the level of the right posterior-inferior cerebellar artery. The right common carotid arteriogram demonstrates the origin the right external carotid artery and its major branches to be widely patent. The right internal carotid artery at the bulb and just distally demonstrates severe pre occlusive stenosis with an angiographic string sign. No focal area of severe stenosis also noted in the right internal carotid artery proximally. More distally there is decreased caliber of the right internal carotid artery to the cranial skull base. The left common carotid arteriogram demonstrates the left external carotid artery and its major branches to be widely patent. The right internal carotid artery at the bulb demonstrates a high-grade stenosis of approximately 75% in the lateral projection associated with a segmental atherosclerotic plaque. More distally the vessel is seen to opacify to the cranial skull base. The petrous,  cavernous and the supraclinoid left ICA demonstrates wide patency. The left anterior and the left middle cerebral artery opacify into the capillary and venous phases. There is mild to moderate narrowing of the left anterior cerebral A1 segment. 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 Delayed arterial and capillary phases demonstrate opacification of the right anterior cerebral artery territory distal to the A2 segment. PROCEDURE: The diagnostic JB 1 catheter in the right common carotid artery was then exchanged over a 0.035 inch 300 cm Rosen exchange guidewire for an 8 [place] Pinnacle sheath in the right groin which was then connected to continuous heparinized saline infusion. The patient was loaded with aspirin 81 mg, and Brilinta 180 mg via an orogastric tube in anticipation for revascularization of the right  internal carotid artery proximally. Over the exchange guidewire, an 087 balloon guide catheter which had been prepped with 50% contrast and 50% heparinized saline infusion was advanced and positioned just proximal to the right common carotid bifurcation. The guidewire was removed. Good aspiration was obtained from the hub of the balloon guide catheter. A gentle control arteriogram performed through this continued to demonstrate no change in the severely pre occlusive narrowing of the right internal carotid artery at the bulb, and also in the proximal right internal carotid artery. Over a 0.014 inch standard Synchro micro guidewire, an 021 Trevo ProVue microcatheter was advanced to the distal end of the balloon guide catheter. The micro guidewire was then gently manipulated using a torque device and advanced without difficulty to the cervical petrous junction. However, advancement of the 021  Trevo ProVue micro catheter was met with significant resistance at the upper tandem calcified lesion. This was then replaced instead with a 017 Headway 2 tip microcatheter which was again advanced over a 0.014 inch standard Synchro micro guidewire without difficulty to the cervical petrous junction. The micro guidewire was removed. Good aspiration obtained from the hub of the microcatheter. This in turn was then exchanged for an 014 inch softip Transend 300 cm exchange micro guidewire with a J configuration. Control arteriogram performed through the balloon guide catheter demonstrated improved flow through the right internal carotid artery proximally and distally. A 4 mm x 30 mm Viatrac 14 balloon angioplasty catheter was then prepped and purged with heparinized saline infusion. Using the rapid exchange technique this was then advanced and positioned without difficulty across the proximal right ICA stenosis. Control inflation was then carried out using micro inflation syringe device via micro tubing to the normal pressure where it was maintained for approximately 20 seconds. At this time, there was significant bradycardia noted. This prompted immediate deflation and retrieval of the balloon whilst the exchange micro guidewire was kept and positioned in the petrous segment of the right internal carotid artery. A control arteriogram performed through the balloon guide catheter in the right common carotid artery now demonstrated significantly improved caliber and flow through the proximally angioplastied segment of the right internal carotid artery now revealing the significant stenosis distal to this involving the calcified plaque. More distally, free flow was seen in the right internal carotid artery to the cranial skull base in the petrous cavernous and supraclinoid segments. The right middle cerebral artery distribution was noted to be widely patent with no evidence of filling defect in the right middle cerebral  artery over its distal territories. A TICI 3 revascularization had been achieved. There was no visualization of the right anterior cerebral artery A1 segment. This was also noted to be on the CT angiogram of the head and neck performed earlier. Measurements were then performed of the right internal carotid artery in the most normal appearance angiographically distal to the proximal angioplasty segment, and also the right common carotid artery. It was decided to proceed with placement of a 6/8 mm x 40 mm Xact stent. The delivery catheter of the stent was then prepped and purged with heparinized saline infusion retrogradely. Again using the rapid exchange technique, this was then advanced without difficulty to the right common carotid bifurcation. The distal portion of the stent was positioned such that it was proximal to the more tandem heavily calcified lesion. This stent was then deployed without difficulty. The delivery system was then retrieved and removed. A control arteriogram performed through the balloon guide catheter in the  right common carotid artery now demonstrates significantly improved caliber and flow through the stented segment and also through the internal carotid artery proximally and distally. There continued to be the calcified significant stenosis involving the proximal right ICA as mentioned earlier. Measurements were performed of the right internal carotid artery in the C1-C2 junction, and also aspirate was the noted in the right internal carotid artery at the proximal aspect of the initial stent. Over the exchange micro guidewire, a 4 mm x 22 mm Resolute Onyx drug-eluting balloon mounted stent was prepped and purged with heparinized saline infusion and also with 50% contrast and 50% heparinized saline infusion. This was then advanced again using the rapid exchange technique without difficulty into the distal portion of the initial stent. However, this stent could not be advanced into the  C1-C2 junction on account of the severe calcific plaque. This is most likely related to calcified ledge protruding intraluminally. After multiple attempts, the balloon mounted stent delivery system was retrieved and removed. It was, therefore, decided to proceed with balloon angioplasty. A TREK 3 mm x 12 mm coronary angioplasty balloon catheter was prepped and purged with heparinized saline infusion. Using the rapid exchange technique, this was advanced without difficulty and positioned in the distal and proximal markers at the site of the severe high-grade stenosis. A control angioplasty was then performed again with a micro inflation syringe device via micro tubing. This was carried to 6 atmospheres where it was maintained for approximately 90 seconds. Upon deflation and proximal retrieval, a control arteriogram performed through the balloon guide catheter in the right common carotid artery now demonstrated significantly improved caliber and flow through the angioplastied segment also distally. It was elected to try advancement of the previously Resolute Onyx 4 x 22 mm balloon mounted stent in view of the improved caliber following the angioplasty. At this time the stent delivery catheter was advanced more distally. However, further advancement was again met middle with significant resistance secondary to calcified plaque. Again after multiple trials, this was abandoned. Control arteriogram performed through the balloon guide catheter in the right common carotid artery continued to demonstrate significantly improved caliber and flow through the angioplastied segment of the right internal carotid artery. There was approximately 80% patency noted at the site of the angioplasty. 10 minute control arteriogram performed through the balloon guide catheter continued to demonstrate significantly improved caliber and flow through the angioplastied segment. A control arteriogram performed intracranially continued to  demonstrate a completely revascularized right middle cerebral artery distribution. An anterior cerebral artery A1 segment again was visualized. The balloon guide was then retrieved and removed. The 8 French sheath was left in place and connected to continuous heparinized saline infusion. No evidence of hematoma or bleeding was seen. The distal pulses remained Dopplerable in the dorsalis pedis, and the posterior tibial regions bilaterally unchanged. The patient was also left intubated on account of the IV tPA the patient had had, in the large-bore Pinnacle sheath. The patient was also given approximately 7.5 mg of Integrilin during the course of the treatment involving the right ICA angioplasty tandem lesion. Additionally it was decided to infuse Aggrastat over approximately 12 hours in order to obviate the potential for acute platelet aggregation at the site of the angioplasty, and also the stent. A flat panel CT of the brain demonstrated no evidence of gross midline shift or of hemorrhage. The patient was then transferred to the neuro ICU to continue with post revascularization management. IMPRESSION: Complete revascularization of previously noted large  filling defect in the right middle cerebral artery distally extending into the proximal inferior division following proximal revascularization, and use of approximately 7.5 mg with a TICI 3 revascularization of the right middle cerebral artery distribution. Status post endovascular revascularization of symptomatic near complete occlusion of the right internal carotid artery proximally with a string sign with placement of stent following balloon angioplasty as described. Status post endovascular balloon angioplasty of significant stenosis secondary to a calcified atherosclerotic plaque involving the proximal and middle 1/3 of the right internal carotid artery with patency of 80%. Non visualization of the right anterior cerebral artery A1 segment. Opacification of the  right anterior cerebral artery at the level of the A2 segment from a communication with the left anterior cerebral artery A2 segment. 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 PLAN: Follow-up in the clinic 4 weeks post discharge. Electronically Signed   By: Luanne Bras M.D.   On: 03/23/2019 15:13   DG Chest Port 1 View  Result Date: 03/24/2019 CLINICAL DATA:  ET tube placed EXAM: PORTABLE CHEST 1 VIEW COMPARISON:  04/12/2019 FINDINGS: Endotracheal tube with the tip 3.6 cm above the carina. Nasogastric tube coursing below the diaphragm projecting over the stomach. Mild right basilar airspace disease. No pleural effusion or pneumothorax. Stable cardiomediastinal silhouette. No aggressive osseous lesion. IMPRESSION: ET tube with the tip 3.6 cm above the carina. Right lower lobe hazy airspace disease which may reflect atelectasis versus pneumonia. Electronically Signed   By: Kathreen Devoid   On: 03/24/2019 08:47   DG Chest Port 1 View  Result Date: 04/10/2019 CLINICAL DATA:  Respiratory failure. EXAM: PORTABLE CHEST 1 VIEW COMPARISON:  October 09, 2013 FINDINGS: An endotracheal tube is seen with its distal tip approximately 3.7 cm from the carina. A nasogastric tube is noted with its distal end overlying the body of the stomach. Very mild atelectasis and/or infiltrate is seen along the medial aspect of the left lung base. There is no evidence of a pleural effusion or pneumothorax. There is moderate to marked severity enlargement of the cardiac silhouette. The visualized skeletal structures are unremarkable. IMPRESSION: 1. Endotracheal tube and nasogastric tube in good position. 2. Moderate to marked severity enlargement of the cardiac silhouette. 3. Mild left basilar atelectasis and/or infiltrate. Electronically Signed   By: Virgina Norfolk M.D.   On: 03/20/2019 19:05   ECHOCARDIOGRAM COMPLETE  Result Date: 03/23/2019    ECHOCARDIOGRAM REPORT    Patient Name:   Jonathan SCHABEN Date of Exam: 03/23/2019 Medical Rec #:  [id number]        Height:       71.0 in Accession #:    [id number]       Weight:       189.2 lb Date of Birth:  [birth year]        BSA:          2.06 m Patient Age:    7 years         BP:           112/66 mmHg Patient Gender: M                HR:           55 bpm. Exam Location:  Inpatient Procedure: 2D Echo, Cardiac Doppler and Color Doppler Indications:    CVA  History:        Patient has no prior history of Echocardiogram examinations.  Stroke, Arrythmias:Bradycardia; Risk Factors:Hypertension.  Sonographer:    Dustin Flock Referring Phys: [phone] ERIC LINDZEN  Sonographer Comments: Echo performed with patient supine and on artificial respirator. Image acquisition challenging due to respiratory motion. IMPRESSIONS  1. Left ventricular ejection fraction, by estimation, is 45%. The left ventricle has mildly decreased function. The left ventrical demonstrates regional wall motion abnormalities (see scoring diagram/findings for description). Akinesis of the mid to apical anteroseptal and inferoseptal walls. Left ventricular diastolic parameters are consistent with Grade I diastolic dysfunction (impaired relaxation).  2. Right ventricular systolic function is mildly reduced. The right ventricular size is mildly enlarged. There is mildly elevated pulmonary artery systolic pressure. The estimated right ventricular systolic pressure is 77.4 mmHg.  3. The aortic valve is tricuspid. Trivial aortic insufficiency, no aortic stenosis.  4. No significant mitral regurgitation.  5. Dilated IVC with estimated RA pressure 15 mmHg. FINDINGS  Left Ventricle: Left ventricular ejection fraction, by estimation, is 45%. The left ventricle has mildly decreased function. The left ventricle demonstrates regional wall motion abnormalities. The left ventricular internal cavity size was normal in size. There is mildly increased left ventricular  hypertrophy. Right Ventricle: The right ventricular size is mildly enlarged. No increase in right ventricular wall thickness. Right ventricular systolic function is mildly reduced. There is mildly elevated pulmonary artery systolic pressure. The tricuspid regurgitant  velocity is 2.54 m/s, and with an assumed right atrial pressure of 8 mmHg, the estimated right ventricular systolic pressure is 12.8 mmHg. Left Atrium: Left atrial size was normal in size. Right Atrium: Right atrial size was normal in size. Pericardium: There is no evidence of pericardial effusion. Mitral Valve: The mitral valve is normal in structure and function. No evidence of mitral valve regurgitation. No evidence of mitral valve stenosis. Tricuspid Valve: The tricuspid valve is normal in structure. Tricuspid valve regurgitation is mild. Aortic Valve: The aortic valve is tricuspid. Aortic valve regurgitation is trivial. Mild aortic valve sclerosis is present, with no evidence of aortic valve stenosis. Pulmonic Valve: The pulmonic valve was normal in structure. Pulmonic valve regurgitation is not visualized. Aorta: The aortic root and ascending aorta are structurally normal, with no evidence of dilitation. Venous: The inferior vena cava is dilated in size with less than 50% respiratory variability, suggesting right atrial pressure of 15 mmHg. IAS/Shunts: No atrial level shunt detected by color flow Doppler.  LEFT VENTRICLE PLAX 2D LVIDd:         4.63 cm  Diastology LVIDs:         3.40 cm  LV e' lateral:   4.35 cm/s LV PW:         1.37 cm  LV E/e' lateral: 10.5 LV IVS:        1.36 cm  LV e' medial:    4.24 cm/s LVOT diam:     2.20 cm  LV E/e' medial:  10.8 LV SV:         62.72 ml LV SV Index:   24.65 LVOT Area:     3.80 cm  RIGHT VENTRICLE RV Basal diam:  3.02 cm RV S prime:     5.87 cm/s TAPSE (M-mode): 3.3 cm LEFT ATRIUM           Index       RIGHT ATRIUM           Index LA diam:      4.30 cm 2.09 cm/m  RA Area:     17.80 cm LA Vol (A4C): 49.5  ml 24.04 ml/m  RA Volume:   45.40 ml  22.05 ml/m  AORTIC VALVE LVOT Vmax:   75.00 cm/s LVOT Vmean:  51.800 cm/s LVOT VTI:    0.165 m  AORTA Ao Root diam: 2.90 cm MITRAL VALVE                        TRICUSPID VALVE MV Area (PHT): 2.16 cm             TR Peak grad:   25.8 mmHg MV Decel Time: 352 msec             TR Vmax:        254.00 cm/s MV E velocity: 45.80 cm/s 103 cm/s MV A velocity: 62.10 cm/s 70.3 cm/s SHUNTS MV E/A ratio:  0.74       1.5       Systemic VTI:  0.16 m                                     Systemic Diam: 2.20 cm Loralie Champagne MD Electronically signed by Loralie Champagne MD Signature Date/Time: 03/23/2019/3:23:23 PM    Final    CT HEAD CODE STROKE WO CONTRAST  Result Date: 03/26/2019 CLINICAL DATA:  Code stroke. 86 year old male with left facial droop and aphasia. EXAM: CT HEAD WITHOUT CONTRAST TECHNIQUE: Contiguous axial images were obtained from the base of the skull through the vertex without intravenous contrast. COMPARISON:  Head CT 12/25/2016. FINDINGS: Brain: Cerebral volume is not significantly changed since 2018. No midline shift, mass effect, or evidence of intracranial mass lesion. No ventriculomegaly. No acute intracranial hemorrhage identified. Scattered bilateral cerebral white matter hypodensity has increased. The deep gray nuclei, brainstem and cerebellum remain within normal limits. No cortically based acute infarct identified. Vascular: Calcified atherosclerosis at the skull base. There is asymmetric right MCA M1 or proximal M2 hyperdensity best seen on series 5, image 29 which is new from 2018. Skull: Negative. Sinuses/Orbits: Visualized paranasal sinuses and mastoids are stable and well pneumatized. Other: Negative orbit and scalp soft tissues. ASPECTS [facility] Stroke Program Early CT Score) Total score (0-10 with 10 being normal): 10 IMPRESSION: 1. Hyperdense Right MCA suspicious for emergent large vessel occlusion. But no CT changes of acute cortically based infarct identified.  ASPECTS 10. 2. No acute intracranial hemorrhage. Increased scattered white matter hypodensity since 2018. 3. These results were communicated to Dr. [name] at 1:14 pmon 2/7/2021by text page via the [facility] messaging system. Electronically Signed   By: Genevie Ann M.D.   On: 03/30/2019 13:16   [place] EKG SITE RITE  Result Date: 03/24/2019 If Site Rite image not attached, placement could not be confirmed due to current cardiac rhythm.  Cerebral Angiogram 03/23/2019 S/P bilateral common carotid arteriograms followed by complete revascularization of RT MCA pre occlusive thrombus following balloon angioplasty with stent placement at prox RT ICA for severe pre occlusive stenosis ,and balloon angioplasty of heavily calcified plaque in proc RT ICAA with a patency  Of approx 80 % .  With TICI 3 revascularization    PHYSICAL EXAM  Temp:  [98.2 F (36.8 C)-99.3 F (37.4 C)] 99.2 F (37.3 C) (02/09 0800) Pulse Rate:  [39-138] 62 (02/09 0715) Resp:  [13-28] 16 (02/09 0715) BP: (90-169)/(52-101) 166/81 (02/09 0800) SpO2:  [90 %-100 %] 91 % (02/09 0715) Arterial Line BP: (91-171)/(53-74) 152/58 (02/08 1715) FiO2 (%):  [40 %-60 %] 50 % (02/09  8841)  General - Well nourished, well developed, intubated on sedation.  Ophthalmologic - fundi not visualized due to noncooperation.  Cardiovascular - Regular rate and rhythm.  Neuro - intubated on sedation with fentanyl and precedex, eyes closed, not following commands. With forced eye opening, eyes in mid position, not blinking to visual threat, doll's eyes sluggish, not tracking, pupils equal size reactive to light. Corneal reflex present bilaterally, gag and cough present. Breathing over the vent.  Facial symmetry not able to test due to ET tube.  Tongue protrusion not cooperative. On pain stimulation, withdraw of all extremities with RUE and RLE 2+/5 stronger than LUE and LLE. DTR 1+ and bilateral positive babinski. Sensation, coordination and gait not  tested.   ASSESSMENT/PLAN [name] is a 86 y.o. male with history of HTN presenting with slurred speech and L facial droop. Received tPA 03/23/2019 at 1324. Taken for IR for distal R M1 thrombus.   Stroke:   R MCA scattered infarcts s/p tPA and IR w/ R ICA stent placement, infarct secondary to large vessel disease source  CT head hyperdense R MCA.     CTA head & neck R MCA LVO mid and distal segment. Cervical R ICA critical stenosis w/ random string sign stenoses. L ICA distal bulb string sign, severe B VA origin stenoses   Cerebral angio TICI 3 revascularization R MCA preocclusive thrombus w/ angioplasty and stent at proximal R ICA and angioplasty of 80% plaque in L ICA.   MRI  R temporal lobe cortical/ subcortical infarct w/ numerous small scattered R brain infarcts. Proximal R M2 MCA w/ loss of signal likely residual thrombus.    2D Echo EF 45%, no SOE seen  LDL 80  HgbA1c 5.9  P2Y12 pending  lovenox for VTE prophylaxis  No antithrombotic prior to admission, now on aspirin 81 mg daily and Brilinta (ticagrelor) 90 mg bid.   Therapy recommendations:  pending   Disposition:  pending   Vent dependent respiratory failure  intubate for IR, left intubated   Sedated   Mental status exclude for extubation today  Weaning off sedation and vent as able  CCM on board  Carotid stenosis  CTA neck - bilateral ICA bulb string sign  S/p right ICA stenting  S/p left ICA angioplasty  Consider further left ICA intervention in the near future - Dr. Estanislado Pandy aware  Hx of Hypertension hypotension  Home meds:  Valsartan-HCTZ 160-12.5, amlodipine 5  On levophed gtt . BP goal 110-180 following IR procedure and tPA administration.  . Wean off pressor as able . Long-term BP goal normotensive  Hyperlipidemia  Home meds:  No statin  LDL 80, goal < 70  On lipitor 40   Continue statin at discharge  Dysphagia . Secondary to stroke . NPO . Speech to see once  extubated  Other Stroke Risk Factors  Advanced age  Former Cigarette smoker, quit 45 yrs ago  ETOH use, will advise to drink no more than 2 drink(s) a day   Other Active Problems  Dementia, on aricept and namenda  Urinary retention, foley placed  Hospital day # 2  This patient is critically ill due to right MCA stroke, bilateral carotid stenosis, status post TPA, and IR, respiratory failure and at significant risk of neurological worsening, death form recurrent stroke, hemorrhagic conversion, seizure, heart failure. This patient's care requires constant monitoring of vital signs, hemodynamics, respiratory and cardiac monitoring, review of multiple databases, neurological assessment, discussion with family, other specialists and medical  decision making of high complexity. I spent 35 minutes of neurocritical care time in the care of this patient. I discussed with Dr. Elsworth Soho and NP Dietrich Pates.   Rosalin Hawking, MD PhD Stroke Neurology 03/24/2019 10:13 AM  ADDENDUM: I had long discussion with wife [name] over the phone, updated pt current condition, treatment plan and potential prognosis, and answered all the questions. She expressed understanding and appreciation.   Rosalin Hawking, MD PhD Stroke Neurology 03/24/2019 5:20 PM  To contact Stroke Continuity provider, please refer to [email or web address] After hours, contact General Neurology

## 2019-03-25 DIAGNOSIS — J9621 Acute and chronic respiratory failure with hypoxia: Secondary | ICD-10-CM | POA: Diagnosis present

## 2019-03-25 DIAGNOSIS — I959 Hypotension, unspecified: Secondary | ICD-10-CM | POA: Insufficient documentation

## 2019-03-25 DIAGNOSIS — I472 Ventricular tachycardia: Secondary | ICD-10-CM

## 2019-03-25 LAB — RENAL FUNCTION PANEL
Albumin: 2.2 g/dL — ABNORMAL LOW (ref 3.5–5.0)
Anion gap: 6 (ref 5–15)
BUN: 17 mg/dL (ref 8–23)
CO2: 21 mmol/L — ABNORMAL LOW (ref 22–32)
Calcium: 8.5 mg/dL — ABNORMAL LOW (ref 8.9–10.3)
Chloride: 114 mmol/L — ABNORMAL HIGH (ref 98–111)
Creatinine, Ser: 1.32 mg/dL — ABNORMAL HIGH (ref 0.61–1.24)
GFR calc Af Amer: 56 mL/min — ABNORMAL LOW (ref >60)
GFR calc non Af Amer: 49 mL/min — ABNORMAL LOW (ref >60)
Glucose, Bld: 190 mg/dL — ABNORMAL HIGH (ref 70–99)
Phosphorus: 1.6 mg/dL — ABNORMAL LOW (ref 2.5–4.6)
Potassium: 3.7 mmol/L (ref 3.5–5.1)
Sodium: 141 mmol/L (ref 135–145)

## 2019-03-25 LAB — GLUCOSE, CAPILLARY
Glucose-Capillary: 137 mg/dL — ABNORMAL HIGH (ref 70–99)
Glucose-Capillary: 140 mg/dL — ABNORMAL HIGH (ref 70–99)
Glucose-Capillary: 146 mg/dL — ABNORMAL HIGH (ref 70–99)
Glucose-Capillary: 164 mg/dL — ABNORMAL HIGH (ref 70–99)
Glucose-Capillary: 165 mg/dL — ABNORMAL HIGH (ref 70–99)
Glucose-Capillary: 177 mg/dL — ABNORMAL HIGH (ref 70–99)
Glucose-Capillary: 181 mg/dL — ABNORMAL HIGH (ref 70–99)

## 2019-03-25 LAB — TRIGLYCERIDES: Triglycerides: 109 mg/dL (ref <150)

## 2019-03-25 LAB — PROCALCITONIN: Procalcitonin: 5.57 ng/mL

## 2019-03-25 LAB — URINE CULTURE: Culture: NO GROWTH

## 2019-03-25 LAB — HEPARIN LEVEL (UNFRACTIONATED)
Heparin Unfractionated: 0.25 IU/mL — ABNORMAL LOW (ref 0.30–0.70)
Heparin Unfractionated: 0.22 IU/mL — ABNORMAL LOW (ref 0.30–0.70)
Heparin Unfractionated: 0.22 IU/mL — ABNORMAL LOW (ref 0.30–0.70)

## 2019-03-25 LAB — CBC
HCT: 38.6 % — ABNORMAL LOW (ref 39.0–52.0)
Hemoglobin: 12.3 g/dL — ABNORMAL LOW (ref 13.0–17.0)
MCH: 32.8 pg (ref 26.0–34.0)
MCHC: 31.9 g/dL (ref 30.0–36.0)
MCV: 102.9 fL — ABNORMAL HIGH (ref 80.0–100.0)
Platelets: UNDETERMINED 10*3/uL (ref 150–400)
RBC: 3.75 MIL/uL — ABNORMAL LOW (ref 4.22–5.81)
RDW: 13.9 % (ref 11.5–15.5)
WBC: 9.1 10*3/uL (ref 4.0–10.5)
nRBC: 0 % (ref 0.0–0.2)

## 2019-03-25 LAB — MAGNESIUM
Magnesium: 1.9 mg/dL (ref 1.7–2.4)
Magnesium: 1.8 mg/dL (ref 1.7–2.4)

## 2019-03-25 LAB — PHOSPHORUS: Phosphorus: 2.1 mg/dL — ABNORMAL LOW (ref 2.5–4.6)

## 2019-03-25 MED ORDER — SENNOSIDES-DOCUSATE SODIUM 8.6-50 MG PO TABS: 1.0000 | ORAL_TABLET | Freq: Every evening | ORAL | Status: DC | PRN

## 2019-03-25 MED ORDER — POTASSIUM PHOSPHATES 15 MMOLE/5ML IV SOLN
30.0000 mmol | Freq: Once | INTRAVENOUS | Status: AC
  Administered 2019-03-25: 10:00:00 30 mmol via INTRAVENOUS
  Filled 2019-03-25: qty 10

## 2019-03-25 NOTE — Consult Note (Addendum)
Cardiology Consultation:   Patient ID: Jonathan Berger MRN: [id number]; DOB: 06-05-1931  Admit date: 03/27/2019 Date of Consult: 03/25/2019  Primary Care Provider: [name], MD Primary Cardiologist: No primary care provider on file.  Primary Electrophysiologist:  None    Patient Profile:   Jonathan Berger is a 86 y.o. male with a hx of anemia, chronic back pain, remote tobacco use, HTN who is being seen today for the evaluation of abnormal echo/decreased EF at the request of Dr. Erlinda Hong.  History of Present Illness:   History was obtained through chart review given current mental status of patient. It appears that the patient has not been seen by cardiology in the past. No known history of MI, stent, or heart failure. He was taking 2 medications for hypertension.   The patient presented to [facility] ED 03/21/2019 for slurred speech, left facial droop, and left sided weakness. CT head show no hemorrhage. CTA head and neck showed right MCA emergent large vessel occlusion affecting the mid and distal M1 segment, and right ICA critical stenosis beginning at the origin and extending into within the first 3.5cm of the vessel. TPA/VIr ordered and he was admitted to Neuro ICU. Patient underwent R ICA stent placement. MRI brain showed R temporal  Lobe cortical/subcortical infarct with numerous scattered R brain infarcts. 2D echo showed EF 45% and cardiology was consulted.  Heart Pathway Score:     Past Medical History:  Diagnosis Date  . Anemia   . Chronic back pain    herniated disc  . Hepatitis    50+yrs ago  . Hypertension    takes Amlodipine/Valsartan/HCTZ daily  . Personal history of kidney stones     Past Surgical History:  Procedure Laterality Date  . BACK SURGERY  1977/006  . COLONOSCOPY    . CYSTOSCOPY    . ESOPHAGOGASTRODUODENOSCOPY    . IR ANGIO INTRA EXTRACRAN SEL COM CAROTID INNOMINATE UNI L MOD SED  03/27/2019  . IR ANGIO VERTEBRAL SEL SUBCLAVIAN INNOMINATE UNI L MOD SED  03/18/2019    . IR CT HEAD LTD  03/18/2019  . IR ENDOVASC INTRACRANIAL INF OTHER THAN THROMBO ART INC DIAG ANGIO  03/23/2019  . IR INTRAVSC STENT CERV CAROTID W/O EMB-PROT MOD SED INC ANGIO  03/28/2019  . LITHOTRIPSY    . LUMBAR LAMINECTOMY  01/29/2011   Procedure: MICRODISCECTOMY LUMBAR LAMINECTOMY;  Surgeon: Marybelle Killings;  Location: [facility];  Service: Orthopedics;  Laterality: Right;  Right L4-5 Microdiscectomy  . RADIOLOGY WITH ANESTHESIA N/A 03/28/2019   Procedure: RADIOLOGY WITH ANESTHESIA;  Surgeon: Luanne Bras, MD;  Location: [facility];  Service: Radiology;  Laterality: N/A;     Home Medications:  Prior to Admission medications   Medication Sig Start Date End Date Taking? Authorizing Provider  amLODipine (NORVASC) 5 MG tablet Take 5 mg by mouth daily. 01/29/19  Yes [provider]  donepezil (ARICEPT) 5 MG tablet Take 5 mg by mouth at bedtime. 03/05/19  Yes [provider]  Ensure (ENSURE) Take 237 mLs by mouth 3 (three) times daily between meals.   Yes [provider]  memantine (NAMENDA) 5 MG tablet Take 5 mg by mouth 2 (two) times daily. 02/04/19  Yes [provider]  triamcinolone cream (KENALOG) 0.1 % Apply 1 application topically at bedtime. Apply to rash on back of right leg below knee   Yes [provider]  valsartan-hydrochlorothiazide (DIOVAN-HCT) 160-12.5 MG tablet Take 1 tablet by mouth daily. 01/29/19  Yes [provider]  amLODipine-valsartan (EXFORGE) 5-320 MG per tablet Take 1 tablet by mouth daily.    04/27/11  [provider]    Inpatient Medications: Scheduled Meds: . atorvastatin  40 mg Per Tube q1800  . chlorhexidine gluconate (MEDLINE KIT)  15 mL Mouth Rinse BID  . Chlorhexidine Gluconate Cloth  6 each Topical Daily  . donepezil  5 mg Per Tube QHS  . feeding supplement (PRO-STAT SUGAR FREE 64)  30 mL Per Tube Daily  . labetalol  20 mg Intravenous Once  . mouth rinse  15 mL Mouth Rinse 10 times per day  .  pantoprazole (PROTONIX) IV  40 mg Intravenous QHS  . sodium chloride flush  10-40 mL Intracatheter Q12H  . sodium chloride flush  3 mL Intravenous Once  . ticagrelor  90 mg Oral BID   Or  . ticagrelor  90 mg Per Tube BID   Continuous Infusions: . sodium chloride    . sodium chloride    . ampicillin-sulbactam (UNASYN) IV Stopped (03/25/19 0548)  . clevidipine    . feeding supplement (VITAL AF 1.2 CAL) 65 mL/hr at 03/25/19 0008  . fentaNYL infusion INTRAVENOUS 75 mcg/hr (03/25/19 1000)  . heparin 1,450 Units/hr (03/25/19 1000)  . norepinephrine (LEVOPHED) Adult infusion 12 mcg/min (03/25/19 1000)  . potassium PHOSPHATE IVPB (in mmol) 85 mL/hr at 03/25/19 1000  . propofol (DIPRIVAN) infusion 30 mcg/kg/min (03/25/19 1000)   PRN Meds: acetaminophen **OR** acetaminophen (TYLENOL) oral liquid 160 mg/5 mL **OR** acetaminophen, bisacodyl, fentaNYL (SUBLIMAZE) injection, fentaNYL (SUBLIMAZE) injection, senna-docusate, sodium chloride flush  Allergies:   No Known Allergies  Social History:   Social History   Socioeconomic History  . Marital status: Married    Spouse name: Not on file  . Number of children: Not on file  . Years of education: Not on file  . Highest education level: Not on file  Occupational History  . Not on file  Tobacco Use  . Smoking status: Former Smoker    Packs/day: 0.25    Years: 30.00    Pack years: 7.50    Types: Cigarettes    Quit date: 01/25/1974    Years since quitting: 45.1  . Smokeless tobacco: Never Used  Substance and Sexual Activity  . Alcohol use: Yes    Comment: occ beer  . Drug use: No  . Sexual activity: Yes  Other Topics Concern  . Not on file  Social History Narrative  . Not on file   Social Determinants of Health   Financial Resource Strain:   . Difficulty of Paying Living Expenses: Not on file  Food Insecurity:   . Worried About Charity fundraiser in the Last Year: Not on file  . Ran Out of Food in the Last Year: Not on file   Transportation Needs:   . Lack of Transportation (Medical): Not on file  . Lack of Transportation (Non-Medical): Not on file  Physical Activity:   . Days of Exercise per Week: Not on file  . Minutes of Exercise per Session: Not on file  Stress:   . Feeling of Stress : Not on file  Social Connections:   . Frequency of Communication with Friends and Family: Not on file  . Frequency of Social Gatherings with Friends and Family: Not on file  . Attends Religious Services: Not on file  . Active Member of Clubs or Organizations: Not on file  . Attends Archivist Meetings: Not on file  . Marital Status: Not on file  Intimate Partner Violence:   . Fear of Current or Ex-Partner: Not on file  . Emotionally Abused: Not on file  . Physically Abused: Not on file  . Sexually Abused: Not on file    Family History:   Family History  Problem Relation Age of Onset  . Anesthesia problems Neg Hx   . Hypotension Neg Hx   . Malignant hyperthermia Neg Hx   . Pseudochol deficiency Neg Hx      ROS:  Please see the history of present illness.  All other ROS reviewed and negative.     Physical Exam/Data:   Vitals:   03/25/19 0915 03/25/19 0930 03/25/19 0945 03/25/19 1000  BP: (!) 143/73 133/81 (!) 139/57 (!) 112/58  Pulse: 79 80 87 93  Resp: 18 (!) 23 15 (!) 22  Temp:      TempSrc:      SpO2: 94% 94% 94% 94%  Weight:      Height:        Intake/Output Summary (Last 24 hours) at 03/25/2019 1045 Last data filed at 03/25/2019 1000 Gross per 24 hour  Intake 3169.63 ml  Output 1235 ml  Net 1934.63 ml   Last 3 Weights 03/31/2019 05/07/2011 04/30/2011  Weight (lbs) 189 lb 2.5 oz 169 lb 3.2 oz 169 lb 3.2 oz  Weight (kg) 85.8 kg 76.749 kg 76.749 kg     Body mass index is 26.38 kg/m.  General:  Intubated and sedated HEENT: normal Lymph: no adenopathy Neck: no JVD Endocrine:  No thryomegaly Vascular: No carotid bruits; FA pulses 2+ bilaterally without bruits  Cardiac:  normal S1, S2;  RRR; no murmur  Lungs:  +rhonchi + rales  Abd: soft, nontender, no hepatomegaly  Ext: no edema Musculoskeletal:  No deformities Skin: warm and dry  Neuro:  Sedated; non-arousable Psych:  Normal affect   EKG:  The EKG was personally reviewed and demonstrates:  NSR, 62 bpm, PACs Telemetry:  Telemetry was personally reviewed and demonstrates:  NSR HR 70-80s, frequent ectopy; PACs; ventricular bigeminy and trigeminy; sinus tachycardia to 120, runs of NSVT, longest 9 beats  Relevant CV Studies:  Echo 03/23/19 1. Left ventricular ejection fraction, by estimation, is 45%. The left  ventricle has mildly decreased function. The left ventrical demonstrates  regional wall motion abnormalities (see scoring diagram/findings for  description). Akinesis of the mid to  apical anteroseptal and inferoseptal walls. Left ventricular diastolic  parameters are consistent with Grade I diastolic dysfunction (impaired  relaxation).  2. Right ventricular systolic function is mildly reduced. The right  ventricular size is mildly enlarged. There is mildly elevated pulmonary  artery systolic pressure. The estimated right ventricular systolic  pressure is 31.5 mmHg.  3. The aortic valve is tricuspid. Trivial aortic insufficiency, no aortic  stenosis.  4. No significant mitral regurgitation.  5. Dilated IVC with estimated RA pressure 15 mmHg.   Laboratory Data:  High Sensitivity Troponin:   Recent Labs  Lab 03/24/19 0950  TROPONINIHS 27*     Chemistry Recent Labs  Lab 03/23/19 0303 03/23/19 0303 03/23/19 1640 03/24/19 0423 03/25/19 0507  NA 139   < > 141 140 141  K 3.2*   < > 3.8 3.9 3.7  CL 105  --   --  111 114*  CO2 20*  --   --  21* 21*  GLUCOSE 154*  --   --  145* 190*  BUN 17  --   --  13 17  CREATININE 1.17  --   --  1.19 1.32*  CALCIUM 9.0  --   --  8.4* 8.5*  GFRNONAA 56*  --   --  55* 49*  GFRAA >60  --   --  >60 56*  ANIONGAP 14  --   --  8 6   < > = values in this interval  not displayed.    Recent Labs  Lab 03/28/2019 1303 03/24/19 0423 03/25/19 0507  PROT 6.6  --   --   ALBUMIN 3.5 2.5* 2.2*  AST 19  --   --   ALT 15  --   --   ALKPHOS 54  --   --   BILITOT 0.8  --   --    Hematology Recent Labs  Lab 03/23/19 0303 03/23/19 1619 03/23/19 1640 03/24/19 0423 03/25/19 0507  WBC 14.3*  --   --  11.4* 9.1  RBC 4.14*  --   --  3.87* 3.75*  HGB 13.6   < > 11.9* 12.8* 12.3*  HCT 41.1   < > 35.0* 39.5 38.6*  MCV 99.3  --   --  102.1* 102.9*  MCH 32.9  --   --  33.1 32.8  MCHC 33.1  --   --  32.4 31.9  RDW 13.2  --   --  13.6 13.9  PLT 159  --   --  130* PLATELET CLUMPS NOTED ON SMEAR, UNABLE TO ESTIMATE   < > = values in this interval not displayed.   BNPNo results for input(s): BNP, PROBNP in the last 168 hours.  DDimer No results for input(s): DDIMER in the last 168 hours.   Radiology/Studies:  CT Code Stroke CTA Head W/WO contrast  Result Date: 04/10/2019 CLINICAL DATA:  86 year old male code stroke presentation with hyperdense right MCA on plain head CT. EXAM: CT ANGIOGRAPHY HEAD AND NECK TECHNIQUE: Multidetector CT imaging of the head and neck was performed using the standard protocol during bolus administration of intravenous contrast. Multiplanar CT image reconstructions and MIPs were obtained to evaluate the vascular anatomy. Carotid stenosis measurements (when applicable) are obtained utilizing NASCET criteria, using the distal internal carotid diameter as the denominator. CONTRAST:  14m OMNIPAQUE IOHEXOL 350 MG/ML SOLN COMPARISON:  Plain head CT 1306 hours today. FINDINGS: CTA NECK Skeleton: Absent maxillary dentition. Advanced degenerative changes in the cervical spine. No acute osseous abnormality identified. Upper chest: Mild upper lung atelectasis. No superior mediastinal lymphadenopathy. Other neck: Negative, no acute findings in the neck. Aortic arch: 3 vessel arch configuration. Mild to moderate arch atherosclerosis, with mostly soft  plaque in the distal arch. Right carotid system: No brachiocephalic or right CCA origin stenosis despite some plaque. No stenosis proximal to the bifurcation, but there is bulky soft plaque or thrombus throughout the right ICA origin and bulb resulting in critical radiographic string sign stenosis on series 9, image 51. Superimposed additional bulky calcified plaque distal to the bulb also resulting in tandem string sign stenoses (series 5, image 105 and series 9 image 50). Despite this the vessel remains patent although is asymmetrically smaller to the skull base (about 3 mm diameter). Left carotid system: No left CCA origin stenosis despite plaque. At the left carotid bifurcation the left ICA origin is patent, but at the distal bulb there is bulky calcified more so than soft plaque resulting in severe stenosis at or approaching a radiographic string sign on series 5, image 103. The vessel remains patent and has a fairly normal caliber distal to this region to the skull base.  Vertebral arteries: Calcified plaque in the proximal right subclavian artery with less than 50 % stenosis with respect to the distal vessel. Minor calcified plaque at the right vertebral artery origin without stenosis. However, there is bulky soft plaque in the right V1 segment resulting in moderate to severe stenosis on series 7, image 272. Distal to this the right vertebral caliber remains normal, and the vessel is then patent to the skull base without additional stenosis. Soft and calcified plaque plus tortuosity of the proximal left subclavian artery resulting in a kinked appearance and moderate stenosis as seen on series 9, image 109. Superimposed severe stenosis at the left vertebral artery origin due to soft plaque on series 8, image 133. The left vertebral is fairly codominant and then patent to the skull base without additional stenosis. CTA HEAD Posterior circulation: Non dominant left vertebral artery with mild calcified plaque does  remain patent to the vertebrobasilar junction without stenosis. Moderate right V4 segment calcification with mild stenosis. Patent vertebrobasilar junction and basilar artery without stenosis. SCA and PCA origins are patent. Posterior communicating arteries are diminutive or absent. There is mild bilateral PCA irregularity and stenosis. Anterior circulation: Both ICA siphons are patent. On the left there is mild to moderate calcified plaque without stenosis. Normal left ophthalmic artery origin. On the right there is mild calcified plaque without stenosis. Normal right ophthalmic artery origin. Patent carotid termini. Patent bilateral MCA and left ACA origins. The left A1 is dominant and the right diminutive or absent. Anterior communicating artery and bilateral ACA branches are within normal limits. Left MCA M1 segment and bifurcation are patent without stenosis. Right MCA proximal M1 segment is patent but becomes near occluded more distally corresponding to the hyperdense segment by plain CT. The mid and distal right M1 segment is affected (series 11, image 20 and series 10, image 19) with intermediate to poor reconstitution of right M2 and M3 branches (series 12, image 9). Venous sinuses: Early contrast timing, grossly patent. Anatomic variants: Mildly dominant right vertebral artery. Review of the MIP images confirms the above findings IMPRESSION: 1. Positive for Right MCA Emergent Large Vessel Occlusion affecting the mid and distal M1 segment. 2. Positive also for superimposed Critical Stenosis cervical Right ICA beginning at the origin, throughout the proximal 3.5 cm of the vessel. There are tandem RADIOGRAPHIC STRING SIGN STENOSES. 3. The above was discussed by telephone with to Dr. [name] at 1338 hours on 04/10/2019. 4. Additional severe atherosclerosis and multifocal high-grade arterial stenoses elsewhere: - Left ICA distal bulb RADIOGRAPHIC STRING SIGN STENOSIS. - Severe bilateral Vertebral Artery origin  stenoses. - proximal Left Subclavian Artery moderate stenosis due to plaque and tortuosity. 5.  Aortic Atherosclerosis (ICD10-I70.0). Electronically Signed   By: Genevie Ann M.D.   On: 03/20/2019 14:16   CT Code Stroke CTA Neck W/WO contrast  Result Date: 03/26/2019 CLINICAL DATA:  86 year old male code stroke presentation with hyperdense right MCA on plain head CT. EXAM: CT ANGIOGRAPHY HEAD AND NECK TECHNIQUE: Multidetector CT imaging of the head and neck was performed using the standard protocol during bolus administration of intravenous contrast. Multiplanar CT image reconstructions and MIPs were obtained to evaluate the vascular anatomy. Carotid stenosis measurements (when applicable) are obtained utilizing NASCET criteria, using the distal internal carotid diameter as the denominator. CONTRAST:  57m OMNIPAQUE IOHEXOL 350 MG/ML SOLN COMPARISON:  Plain head CT 1306 hours today. FINDINGS: CTA NECK Skeleton: Absent maxillary dentition. Advanced degenerative changes in the cervical spine. No acute osseous abnormality identified. Upper  chest: Mild upper lung atelectasis. No superior mediastinal lymphadenopathy. Other neck: Negative, no acute findings in the neck. Aortic arch: 3 vessel arch configuration. Mild to moderate arch atherosclerosis, with mostly soft plaque in the distal arch. Right carotid system: No brachiocephalic or right CCA origin stenosis despite some plaque. No stenosis proximal to the bifurcation, but there is bulky soft plaque or thrombus throughout the right ICA origin and bulb resulting in critical radiographic string sign stenosis on series 9, image 51. Superimposed additional bulky calcified plaque distal to the bulb also resulting in tandem string sign stenoses (series 5, image 105 and series 9 image 50). Despite this the vessel remains patent although is asymmetrically smaller to the skull base (about 3 mm diameter). Left carotid system: No left CCA origin stenosis despite plaque. At the left  carotid bifurcation the left ICA origin is patent, but at the distal bulb there is bulky calcified more so than soft plaque resulting in severe stenosis at or approaching a radiographic string sign on series 5, image 103. The vessel remains patent and has a fairly normal caliber distal to this region to the skull base. Vertebral arteries: Calcified plaque in the proximal right subclavian artery with less than 50 % stenosis with respect to the distal vessel. Minor calcified plaque at the right vertebral artery origin without stenosis. However, there is bulky soft plaque in the right V1 segment resulting in moderate to severe stenosis on series 7, image 272. Distal to this the right vertebral caliber remains normal, and the vessel is then patent to the skull base without additional stenosis. Soft and calcified plaque plus tortuosity of the proximal left subclavian artery resulting in a kinked appearance and moderate stenosis as seen on series 9, image 109. Superimposed severe stenosis at the left vertebral artery origin due to soft plaque on series 8, image 133. The left vertebral is fairly codominant and then patent to the skull base without additional stenosis. CTA HEAD Posterior circulation: Non dominant left vertebral artery with mild calcified plaque does remain patent to the vertebrobasilar junction without stenosis. Moderate right V4 segment calcification with mild stenosis. Patent vertebrobasilar junction and basilar artery without stenosis. SCA and PCA origins are patent. Posterior communicating arteries are diminutive or absent. There is mild bilateral PCA irregularity and stenosis. Anterior circulation: Both ICA siphons are patent. On the left there is mild to moderate calcified plaque without stenosis. Normal left ophthalmic artery origin. On the right there is mild calcified plaque without stenosis. Normal right ophthalmic artery origin. Patent carotid termini. Patent bilateral MCA and left ACA origins.  The left A1 is dominant and the right diminutive or absent. Anterior communicating artery and bilateral ACA branches are within normal limits. Left MCA M1 segment and bifurcation are patent without stenosis. Right MCA proximal M1 segment is patent but becomes near occluded more distally corresponding to the hyperdense segment by plain CT. The mid and distal right M1 segment is affected (series 11, image 20 and series 10, image 19) with intermediate to poor reconstitution of right M2 and M3 branches (series 12, image 9). Venous sinuses: Early contrast timing, grossly patent. Anatomic variants: Mildly dominant right vertebral artery. Review of the MIP images confirms the above findings IMPRESSION: 1. Positive for Right MCA Emergent Large Vessel Occlusion affecting the mid and distal M1 segment. 2. Positive also for superimposed Critical Stenosis cervical Right ICA beginning at the origin, throughout the proximal 3.5 cm of the vessel. There are tandem RADIOGRAPHIC STRING SIGN STENOSES. 3. The  above was discussed by telephone with to Dr. [name] at 1338 hours on 04/04/2019. 4. Additional severe atherosclerosis and multifocal high-grade arterial stenoses elsewhere: - Left ICA distal bulb RADIOGRAPHIC STRING SIGN STENOSIS. - Severe bilateral Vertebral Artery origin stenoses. - proximal Left Subclavian Artery moderate stenosis due to plaque and tortuosity. 5.  Aortic Atherosclerosis (ICD10-I70.0). Electronically Signed   By: Genevie Ann M.D.   On: 04/08/2019 14:16   MR BRAIN WO CONTRAST  Result Date: 03/23/2019 CLINICAL DATA:  Stroke, follow-up. EXAM: MRI HEAD WITHOUT CONTRAST TECHNIQUE: Multiplanar, multiecho pulse sequences of the brain and surrounding structures were obtained without intravenous contrast. COMPARISON:  Noncontrast CT head, CT angiogram head/neck and CT perfusion 03/21/2019 FINDINGS: Brain: There is an acute cortical/subcortical infarct within the anterolateral right temporal lobe measuring 3.1 x 1.5 cm in  transaxial dimensions (series 5, image 67). There are numerous additional small scattered cortical and subcortical infarcts within the right cerebral hemisphere MCA vascular territory involving the right frontal lobe, parietal lobe, occipital lobe and temporal lobe as well as right insula. There is no significant mass effect. No midline shift or extra-axial fluid collection. No chronic intracranial blood products. Moderate patchy T2/FLAIR hyperintensity within the cerebral white matter is nonspecific, but consistent with chronic small vessel ischemic disease. Moderate generalized parenchymal atrophy. Vascular: SWI signal loss in the region of proximal right M2 MCA branches (series 12, image 23) which may reflect residual thrombus. Skull and upper cervical spine: No focal marrow lesion. Incompletely assessed upper cervical spondylosis. Sinuses/Orbits: Visualized orbits demonstrate no acute abnormality. Paranasal sinus mucosal thickening. Most notably there is moderate mucosal thickening within bilateral ethmoid air cells. Left sphenoid sinus air-fluid level. Small bilateral mastoid effusions. IMPRESSION: 3.1 cm acute cortical/subcortical infarct within the anterolateral right temporal lobe. Numerous additional small scattered acute cortical and subcortical infarct within the right cerebral hemisphere MCA vascular territory. No significant mass effect or evidence of parenchymal hemorrhage. SWI signal loss in the region of proximal right M2 MCA branches which may reflect residual thrombus. Moderate generalized parenchymal atrophy and chronic small vessel ischemic disease. Paranasal sinus disease as described. Small bilateral mastoid effusions. Electronically Signed   By: Kellie Simmering DO   On: 03/23/2019 13:43   IR INTRAVSC STENT CERV CAROTID W/O EMB-PROT MOD SED  Result Date: 03/25/2019 INDICATION: New onset of slurred speech and left-sided weakness. Left-sided neglect. CT angiogram of the head and neck revealing a  pre occlusive severe stenosis of the right internal carotid proximally with angiographic string sign.  Large non occlusive filling defect in the right middle cerebral artery distally extending into the inferior division proximally.  EXAM: 1. EMERGENT LARGE VESSEL OCCLUSION THROMBOLYSIS (anterior CIRCULATION)  COMPARISON:  CT angiogram of the head and neck of March 22, 2019.  MEDICATIONS: Ancef 2 g IV antibiotic was administered within 1 hour of the procedure.  ANESTHESIA/SEDATION: General anesthesia.  CONTRAST:  Omnipaque 300 approximately 120 mL.  FLUOROSCOPY TIME:  Fluoroscopy Time: 76 minutes 48 seconds (1992 mGy).  COMPLICATIONS: None immediate.  TECHNIQUE: Following a full explanation of the procedure along with the potential associated complications, an informed witnessed consent was obtained from the wife. The risks of intracranial hemorrhage of 10%, worsening neurological deficit, ventilator dependency, death and inability to revascularize were all reviewed in detail with the patient's spouse.  The patient was then put under general anesthesia by the Department of Anesthesiology at [facility] ([facility]).  The right groin was prepped and draped in the usual sterile fashion. Thereafter using modified Seldinger technique,  transfemoral access into the right common femoral artery was obtained without difficulty. Over a 0.035 inch guidewire a 5 French Pinnacle sheath was inserted. Through this, and also over a 0.035 inch guidewire a 5 [place] JB 1 catheter was advanced to the aortic arch region and selectively positioned in the innominate artery and the right common carotid artery.  FINDINGS: The innominate artery angiogram demonstrates the origin of the right subclavian artery and the right common carotid artery to be widely patent.  There is mild narrowing of the origin of the right vertebral artery.  The vessel is, otherwise, seen to opacify to the cranial skull base to the level of the right  posterior-inferior cerebellar artery.  The right common carotid arteriogram demonstrates the origin the right external carotid artery and its major branches to be widely patent.  The right internal carotid artery at the bulb and just distally demonstrates severe pre occlusive stenosis with an angiographic string sign.  No focal area of severe stenosis also noted in the right internal carotid artery proximally. More distally there is decreased caliber of the right internal carotid artery to the cranial skull base. The left common carotid arteriogram demonstrates the left external carotid artery and its major branches to be widely patent.  The right internal carotid artery at the bulb demonstrates a high-grade stenosis of approximately 75% in the lateral projection associated with a segmental atherosclerotic plaque.  More distally the vessel is seen to opacify to the cranial skull base. The petrous, cavernous and the supraclinoid left ICA demonstrates wide patency.  The left anterior and the left middle cerebral artery opacify into the capillary and venous phases. There is mild to moderate narrowing of the left anterior cerebral A1 segment.  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  Delayed arterial and capillary phases demonstrate opacification of the right anterior cerebral artery territory distal to the A2 segment.  PROCEDURE: The diagnostic JB 1 catheter in the right common carotid artery was then exchanged over a 0.035 inch 300 cm Rosen exchange guidewire for an 8 [place] Pinnacle sheath in the right groin which was then connected to continuous heparinized saline infusion. The patient was loaded with aspirin 81 mg, and Brilinta 180 mg via an orogastric tube in anticipation for  revascularization of the right internal carotid artery proximally.  Over the exchange guidewire, an 087 balloon guide catheter which had been prepped with 50% contrast and 50% heparinized saline infusion was advanced and positioned just proximal to the right common carotid bifurcation. The guidewire was removed. Good aspiration was obtained from the hub of the balloon guide catheter. A gentle control arteriogram performed through this continued to demonstrate no change in the severely pre occlusive narrowing of the right internal carotid artery at the bulb, and also in the proximal right internal carotid artery.  Over a 0.014 inch standard Synchro micro guidewire, an 021 Trevo ProVue microcatheter was advanced to the distal end of the balloon guide catheter. The micro guidewire was then gently manipulated using a torque device and advanced without difficulty to the cervical petrous junction. However, advancement of the 021 Trevo ProVue micro catheter was met with significant resistance at the upper tandem calcified lesion.  This was then replaced instead with a 017 Headway 2 tip microcatheter which was again advanced over a 0.014  inch standard Synchro micro guidewire without difficulty to the cervical petrous junction.  The micro guidewire was removed. Good aspiration obtained from the hub of the microcatheter. This in turn was then exchanged for an 014 inch softip Transend 300 cm exchange micro guidewire with a J configuration.  Control arteriogram performed through the balloon guide catheter demonstrated improved flow through the right internal carotid artery proximally and distally. A 4 mm x 30 mm Viatrac 14 balloon angioplasty catheter was then prepped and purged with heparinized saline infusion.  Using the rapid exchange technique this was then advanced and positioned without difficulty across the proximal right ICA stenosis.  Control inflation was then carried out using micro inflation syringe device  via micro tubing to the normal pressure where it was maintained for approximately 20 seconds. At this time, there was significant bradycardia noted. This prompted immediate deflation and retrieval of the balloon whilst the exchange micro guidewire was kept and positioned in the petrous segment of the right internal carotid artery.  A control arteriogram performed through the balloon guide catheter in the right common carotid artery now demonstrated significantly improved caliber and flow through the proximally angioplastied segment of the right internal carotid artery now revealing the significant stenosis distal to this involving the calcified plaque.  More distally, free flow was seen in the right internal carotid artery to the cranial skull base in the petrous cavernous and supraclinoid segments. The right middle cerebral artery distribution was noted to be widely patent with no evidence of filling defect in the right middle cerebral artery over its distal territories.  A TICI 3 revascularization had been achieved.  There was no visualization of the right anterior cerebral artery A1 segment. This was also noted to be on the CT angiogram of the head and neck performed earlier.  Measurements were then performed of the right internal carotid artery in the most normal appearance angiographically distal to the proximal angioplasty segment, and also the right common carotid artery. It was decided to proceed with placement of a 6/8 mm x 40 mm Xact stent.  The delivery catheter of the stent was then prepped and purged with heparinized saline infusion retrogradely.  Again using the rapid exchange technique, this was then advanced without difficulty to the right common carotid bifurcation.  The distal portion of the stent was positioned such that it was proximal to the more tandem heavily calcified lesion.  This stent was then deployed without difficulty. The delivery system was then retrieved and removed.  A  control arteriogram performed through the balloon guide catheter in the right common carotid artery now demonstrates significantly improved caliber and flow through the stented segment and also through the internal carotid artery proximally and distally.  There continued to be the calcified significant stenosis involving the proximal right ICA as mentioned earlier.  Measurements were performed of the right internal carotid artery in the C1-C2 junction, and also aspirate was the noted in the right internal carotid artery at the proximal aspect of the initial stent.  Over the exchange micro guidewire, a 4 mm x 22 mm Resolute Onyx drug-eluting balloon mounted stent was prepped and purged with heparinized saline infusion and also with 50% contrast and 50% heparinized saline infusion.  This was then advanced again using the rapid exchange technique without difficulty into the distal portion of the initial stent. However, this stent could not be advanced into the C1-C2 junction on account of the severe calcific plaque.  This is most likely related to  calcified ledge protruding intraluminally.  After multiple attempts, the balloon mounted stent delivery system was retrieved and removed.  It was, therefore, decided to proceed with balloon angioplasty. A TREK 3 mm x 12 mm coronary angioplasty balloon catheter was prepped and purged with heparinized saline infusion. Using the rapid exchange technique, this was advanced without difficulty and positioned in the distal and proximal markers at the site of the severe high-grade stenosis. A control angioplasty was then performed again with a micro inflation syringe device via micro tubing. This was carried to 6 atmospheres where it was maintained for approximately 90 seconds. Upon deflation and proximal retrieval, a control arteriogram performed through the balloon guide catheter in the right common carotid artery now demonstrated significantly improved caliber and flow  through the angioplastied segment also distally.  It was elected to try advancement of the previously Resolute Onyx 4 x 22 mm balloon mounted stent in view of the improved caliber following the angioplasty.  At this time the stent delivery catheter was advanced more distally. However, further advancement was again met middle with significant resistance secondary to calcified plaque.  Again after multiple trials, this was abandoned.  Control arteriogram performed through the balloon guide catheter in the right common carotid artery continued to demonstrate significantly improved caliber and flow through the angioplastied segment of the right internal carotid artery.  There was approximately 80% patency noted at the site of the angioplasty.  10 minute control arteriogram performed through the balloon guide catheter continued to demonstrate significantly improved caliber and flow through the angioplastied segment.  A control arteriogram performed intracranially continued to demonstrate a completely revascularized right middle cerebral artery distribution. An anterior cerebral artery A1 segment again was visualized.  The balloon guide was then retrieved and removed. The 8 French sheath was left in place and connected to continuous heparinized saline infusion. No evidence of hematoma or bleeding was seen. The distal pulses remained Dopplerable in the dorsalis pedis, and the posterior tibial regions bilaterally unchanged.  The patient was also left intubated on account of the IV tPA the patient had had, in the large-bore Pinnacle sheath.  The patient was also given approximately 7.5 mg of Integrilin during the course of the treatment involving the right ICA angioplasty tandem lesion. Additionally it was decided to infuse Aggrastat over approximately 12 hours in order to obviate the potential for acute platelet aggregation at the site of the angioplasty, and also the stent.  A flat panel CT of the brain  demonstrated no evidence of gross midline shift or of hemorrhage.  The patient was then transferred to the neuro ICU to continue with post revascularization management.  IMPRESSION: Complete revascularization of previously noted large filling defect in the right middle cerebral artery distally extending into the proximal inferior division following proximal revascularization, and use of approximately 7.5 mg with a TICI 3 revascularization of the right middle cerebral artery distribution.  Status post endovascular revascularization of symptomatic near complete occlusion of the right internal carotid artery proximally with a string sign with placement of stent following balloon angioplasty as described.  Status post endovascular balloon angioplasty of significant stenosis secondary to a calcified atherosclerotic plaque involving the proximal and middle 1/3 of the right internal carotid artery with patency of 80%.  Non visualization of the right anterior cerebral artery A1 segment. Opacification of the right anterior cerebral artery at the level of the A2 segment from a communication with the left anterior cerebral artery A2 segment. 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  PLAN: Follow-up in the clinic 4 weeks post discharge.   Electronically Signed   By: Luanne Bras M.D.   On: 03/23/2019 15:13  IR CT [facility]  Result Date: 03/24/2019 INDICATION: New onset of slurred speech and left-sided weakness. Left-sided neglect. CT angiogram of the head and neck revealing a pre occlusive severe stenosis of the right internal carotid proximally with angiographic string sign. Large non occlusive filling defect in the right middle cerebral artery distally extending into the inferior division proximally. EXAM: 1. EMERGENT LARGE VESSEL OCCLUSION THROMBOLYSIS (anterior CIRCULATION) COMPARISON:  CT angiogram of the head and neck of March 22, 2019.  MEDICATIONS: Ancef 2 g IV antibiotic was administered within 1 hour of the procedure. ANESTHESIA/SEDATION: General anesthesia. CONTRAST:  Omnipaque 300 approximately 120 mL. FLUOROSCOPY TIME:  Fluoroscopy Time: 76 minutes 48 seconds (1992 mGy). COMPLICATIONS: None immediate. TECHNIQUE: Following a full explanation of the procedure along with the potential associated complications, an informed witnessed consent was obtained from the wife. The risks of intracranial hemorrhage of 10%, worsening neurological deficit, ventilator dependency, death and inability to revascularize were all reviewed in detail with the patient's spouse. The patient was then put under general anesthesia by the Department of Anesthesiology at [facility]. The right groin was prepped and draped in the usual sterile fashion. Thereafter using modified Seldinger technique, transfemoral access into the right common femoral artery was obtained without difficulty. Over a 0.035 inch guidewire a 5 French Pinnacle sheath was inserted. Through this, and also over a 0.035 inch guidewire a 5 [place] JB 1 catheter was advanced to the aortic arch region and selectively positioned in the innominate artery and the right common carotid artery. FINDINGS: The innominate artery angiogram demonstrates the origin of the right subclavian artery and the right common carotid artery to be widely patent. There is mild narrowing of the origin of the right vertebral artery. The vessel is, otherwise, seen to opacify to the cranial skull base to the level of the right posterior-inferior cerebellar artery. The right common carotid arteriogram demonstrates the origin the right external carotid artery and its major branches to be widely patent. The right internal carotid artery at the bulb and just distally demonstrates severe pre occlusive stenosis with an angiographic string sign. No focal area of severe stenosis also noted in the right internal carotid artery  proximally. More distally there is decreased caliber of the right internal carotid artery to the cranial skull base. The left common carotid arteriogram demonstrates the left external carotid artery and its major branches to be widely patent. The right internal carotid artery at the bulb demonstrates a high-grade stenosis of approximately 75% in the lateral projection associated with a segmental atherosclerotic plaque. More distally the vessel is seen to opacify to the cranial skull base. The petrous, cavernous and the supraclinoid left ICA demonstrates wide patency. The left anterior and the left middle cerebral artery opacify into the capillary and venous phases. There is mild to moderate narrowing of the left anterior cerebral A1 segment. 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 Delayed arterial and capillary phases demonstrate opacification of the right anterior cerebral artery territory distal to the A2 segment. PROCEDURE: The diagnostic JB 1 catheter in the right common carotid  artery was then exchanged over a 0.035 inch 300 cm Jonathan Holster exchange guidewire for an 8 [place] Pinnacle sheath in the right groin which was then connected to continuous heparinized saline infusion. The patient was loaded with aspirin 81 mg, and Brilinta 180 mg via an orogastric tube in anticipation for revascularization of the right internal carotid artery proximally. Over the exchange guidewire, an 087 balloon guide catheter which had been prepped with 50% contrast and 50% heparinized saline infusion was advanced and positioned just proximal to the right common carotid bifurcation. The guidewire was removed. Good aspiration was obtained from the hub of the balloon guide catheter. A gentle control arteriogram performed  through this continued to demonstrate no change in the severely pre occlusive narrowing of the right internal carotid artery at the bulb, and also in the proximal right internal carotid artery. Over a 0.014 inch standard Synchro micro guidewire, an 021 Trevo ProVue microcatheter was advanced to the distal end of the balloon guide catheter. The micro guidewire was then gently manipulated using a torque device and advanced without difficulty to the cervical petrous junction. However, advancement of the 021 Trevo ProVue micro catheter was met with significant resistance at the upper tandem calcified lesion. This was then replaced instead with a 017 Headway 2 tip microcatheter which was again advanced over a 0.014 inch standard Synchro micro guidewire without difficulty to the cervical petrous junction. The micro guidewire was removed. Good aspiration obtained from the hub of the microcatheter. This in turn was then exchanged for an 014 inch softip Transend 300 cm exchange micro guidewire with a J configuration. Control arteriogram performed through the balloon guide catheter demonstrated improved flow through the right internal carotid artery proximally and distally. A 4 mm x 30 mm Viatrac 14 balloon angioplasty catheter was then prepped and purged with heparinized saline infusion. Using the rapid exchange technique this was then advanced and positioned without difficulty across the proximal right ICA stenosis. Control inflation was then carried out using micro inflation syringe device via micro tubing to the normal pressure where it was maintained for approximately 20 seconds. At this time, there was significant bradycardia noted. This prompted immediate deflation and retrieval of the balloon whilst the exchange micro guidewire was kept and positioned in the petrous segment of the right internal carotid artery. A control arteriogram performed through the balloon guide catheter in the right common carotid artery now  demonstrated significantly improved caliber and flow through the proximally angioplastied segment of the right internal carotid artery now revealing the significant stenosis distal to this involving the calcified plaque. More distally, free flow was seen in the right internal carotid artery to the cranial skull base in the petrous cavernous and supraclinoid segments. The right middle cerebral artery distribution was noted to be widely patent with no evidence of filling defect in the right middle cerebral artery over its distal territories. A TICI 3 revascularization had been achieved. There was no visualization of the right anterior cerebral artery A1 segment. This was also noted to be on the CT angiogram of the head and neck performed earlier. Measurements were then performed of the right internal carotid artery in the most normal appearance angiographically distal to the proximal angioplasty segment, and also the right common carotid artery. It was decided to proceed with placement of a 6/8 mm x 40 mm Xact stent. The delivery catheter of the stent was then prepped and purged with heparinized saline infusion retrogradely. Again using the rapid exchange technique, this was then  advanced without difficulty to the right common carotid bifurcation. The distal portion of the stent was positioned such that it was proximal to the more tandem heavily calcified lesion. This stent was then deployed without difficulty. The delivery system was then retrieved and removed. A control arteriogram performed through the balloon guide catheter in the right common carotid artery now demonstrates significantly improved caliber and flow through the stented segment and also through the internal carotid artery proximally and distally. There continued to be the calcified significant stenosis involving the proximal right ICA as mentioned earlier. Measurements were performed of the right internal carotid artery in the C1-C2 junction, and  also aspirate was the noted in the right internal carotid artery at the proximal aspect of the initial stent. Over the exchange micro guidewire, a 4 mm x 22 mm Resolute Onyx drug-eluting balloon mounted stent was prepped and purged with heparinized saline infusion and also with 50% contrast and 50% heparinized saline infusion. This was then advanced again using the rapid exchange technique without difficulty into the distal portion of the initial stent. However, this stent could not be advanced into the C1-C2 junction on account of the severe calcific plaque. This is most likely related to calcified ledge protruding intraluminally. After multiple attempts, the balloon mounted stent delivery system was retrieved and removed. It was, therefore, decided to proceed with balloon angioplasty. A TREK 3 mm x 12 mm coronary angioplasty balloon catheter was prepped and purged with heparinized saline infusion. Using the rapid exchange technique, this was advanced without difficulty and positioned in the distal and proximal markers at the site of the severe high-grade stenosis. A control angioplasty was then performed again with a micro inflation syringe device via micro tubing. This was carried to 6 atmospheres where it was maintained for approximately 90 seconds. Upon deflation and proximal retrieval, a control arteriogram performed through the balloon guide catheter in the right common carotid artery now demonstrated significantly improved caliber and flow through the angioplastied segment also distally. It was elected to try advancement of the previously Resolute Onyx 4 x 22 mm balloon mounted stent in view of the improved caliber following the angioplasty. At this time the stent delivery catheter was advanced more distally. However, further advancement was again met middle with significant resistance secondary to calcified plaque. Again after multiple trials, this was abandoned. Control arteriogram performed through the  balloon guide catheter in the right common carotid artery continued to demonstrate significantly improved caliber and flow through the angioplastied segment of the right internal carotid artery. There was approximately 80% patency noted at the site of the angioplasty. 10 minute control arteriogram performed through the balloon guide catheter continued to demonstrate significantly improved caliber and flow through the angioplastied segment. A control arteriogram performed intracranially continued to demonstrate a completely revascularized right middle cerebral artery distribution. An anterior cerebral artery A1 segment again was visualized. The balloon guide was then retrieved and removed. The 8 French sheath was left in place and connected to continuous heparinized saline infusion. No evidence of hematoma or bleeding was seen. The distal pulses remained Dopplerable in the dorsalis pedis, and the posterior tibial regions bilaterally unchanged. The patient was also left intubated on account of the IV tPA the patient had had, in the large-bore Pinnacle sheath. The patient was also given approximately 7.5 mg of Integrilin during the course of the treatment involving the right ICA angioplasty tandem lesion. Additionally it was decided to infuse Aggrastat over approximately 12 hours in order to obviate  the potential for acute platelet aggregation at the site of the angioplasty, and also the stent. A flat panel CT of the brain demonstrated no evidence of gross midline shift or of hemorrhage. The patient was then transferred to the neuro ICU to continue with post revascularization management. IMPRESSION: Complete revascularization of previously noted large filling defect in the right middle cerebral artery distally extending into the proximal inferior division following proximal revascularization, and use of approximately 7.5 mg with a TICI 3 revascularization of the right middle cerebral artery distribution. Status post  endovascular revascularization of symptomatic near complete occlusion of the right internal carotid artery proximally with a string sign with placement of stent following balloon angioplasty as described. Status post endovascular balloon angioplasty of significant stenosis secondary to a calcified atherosclerotic plaque involving the proximal and middle 1/3 of the right internal carotid artery with patency of 80%. Non visualization of the right anterior cerebral artery A1 segment. Opacification of the right anterior cerebral artery at the level of the A2 segment from a communication with the left anterior cerebral artery A2 segment. 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 PLAN: Follow-up in the clinic 4 weeks post discharge. Electronically Signed   By: Luanne Bras M.D.   On: 03/23/2019 15:13   DG Chest Port 1 View  Result Date: 03/24/2019 CLINICAL DATA:  ET tube placed EXAM: PORTABLE CHEST 1 VIEW COMPARISON:  03/31/2019 FINDINGS: Endotracheal tube with the tip 3.6 cm above the carina. Nasogastric tube coursing below the diaphragm projecting over the stomach. Mild right basilar airspace disease. No pleural effusion or pneumothorax. Stable cardiomediastinal silhouette. No aggressive osseous lesion. IMPRESSION: ET tube with the tip 3.6 cm above the carina. Right lower lobe hazy airspace disease which may reflect atelectasis versus pneumonia. Electronically Signed   By: Kathreen Devoid   On: 03/24/2019 08:47   DG Chest Port 1 View  Result Date: 04/07/2019 CLINICAL DATA:  Respiratory failure. EXAM: PORTABLE CHEST 1 VIEW COMPARISON:  October 09, 2013 FINDINGS: An endotracheal tube is seen with its distal tip approximately 3.7 cm from the carina. A nasogastric tube is noted with its distal end overlying the body of the stomach. Very mild atelectasis and/or infiltrate is seen along the medial aspect of the left lung base. There is no evidence of a pleural  effusion or pneumothorax. There is moderate to marked severity enlargement of the cardiac silhouette. The visualized skeletal structures are unremarkable. IMPRESSION: 1. Endotracheal tube and nasogastric tube in good position. 2. Moderate to marked severity enlargement of the cardiac silhouette. 3. Mild left basilar atelectasis and/or infiltrate. Electronically Signed   By: Virgina Norfolk M.D.   On: 03/19/2019 19:05   ECHOCARDIOGRAM COMPLETE  Result Date: 03/23/2019    ECHOCARDIOGRAM REPORT   Patient Name:   Jonathan Berger Date of Exam: 03/23/2019 Medical Rec #:  [id number]        Height:       71.0 in Accession #:    [id number]       Weight:       189.2 lb Date of Birth:  1931/11/06        BSA:          2.06 m Patient Age:    59 years         BP:           112/66 mmHg Patient Gender: M  HR:           55 bpm. Exam Location:  Inpatient Procedure: 2D Echo, Cardiac Doppler and Color Doppler Indications:    CVA  History:        Patient has no prior history of Echocardiogram examinations.                 Stroke, Arrythmias:Bradycardia; Risk Factors:Hypertension.  Sonographer:    Dustin Flock Referring Phys: [phone] ERIC LINDZEN  Sonographer Comments: Echo performed with patient supine and on artificial respirator. Image acquisition challenging due to respiratory motion. IMPRESSIONS  1. Left ventricular ejection fraction, by estimation, is 45%. The left ventricle has mildly decreased function. The left ventrical demonstrates regional wall motion abnormalities (see scoring diagram/findings for description). Akinesis of the mid to apical anteroseptal and inferoseptal walls. Left ventricular diastolic parameters are consistent with Grade I diastolic dysfunction (impaired relaxation).  2. Right ventricular systolic function is mildly reduced. The right ventricular size is mildly enlarged. There is mildly elevated pulmonary artery systolic pressure. The estimated right ventricular systolic pressure is 73.4  mmHg.  3. The aortic valve is tricuspid. Trivial aortic insufficiency, no aortic stenosis.  4. No significant mitral regurgitation.  5. Dilated IVC with estimated RA pressure 15 mmHg. FINDINGS  Left Ventricle: Left ventricular ejection fraction, by estimation, is 45%. The left ventricle has mildly decreased function. The left ventricle demonstrates regional wall motion abnormalities. The left ventricular internal cavity size was normal in size. There is mildly increased left ventricular hypertrophy. Right Ventricle: The right ventricular size is mildly enlarged. No increase in right ventricular wall thickness. Right ventricular systolic function is mildly reduced. There is mildly elevated pulmonary artery systolic pressure. The tricuspid regurgitant  velocity is 2.54 m/s, and with an assumed right atrial pressure of 8 mmHg, the estimated right ventricular systolic pressure is 28.7 mmHg. Left Atrium: Left atrial size was normal in size. Right Atrium: Right atrial size was normal in size. Pericardium: There is no evidence of pericardial effusion. Mitral Valve: The mitral valve is normal in structure and function. No evidence of mitral valve regurgitation. No evidence of mitral valve stenosis. Tricuspid Valve: The tricuspid valve is normal in structure. Tricuspid valve regurgitation is mild. Aortic Valve: The aortic valve is tricuspid. Aortic valve regurgitation is trivial. Mild aortic valve sclerosis is present, with no evidence of aortic valve stenosis. Pulmonic Valve: The pulmonic valve was normal in structure. Pulmonic valve regurgitation is not visualized. Aorta: The aortic root and ascending aorta are structurally normal, with no evidence of dilitation. Venous: The inferior vena cava is dilated in size with less than 50% respiratory variability, suggesting right atrial pressure of 15 mmHg. IAS/Shunts: No atrial level shunt detected by color flow Doppler.  LEFT VENTRICLE PLAX 2D LVIDd:         4.63 cm  Diastology  LVIDs:         3.40 cm  LV e' lateral:   4.35 cm/s LV PW:         1.37 cm  LV E/e' lateral: 10.5 LV IVS:        1.36 cm  LV e' medial:    4.24 cm/s LVOT diam:     2.20 cm  LV E/e' medial:  10.8 LV SV:         62.72 ml LV SV Index:   24.65 LVOT Area:     3.80 cm  RIGHT VENTRICLE RV Basal diam:  3.02 cm RV S prime:     5.87 cm/s  TAPSE (M-mode): 3.3 cm LEFT ATRIUM           Index       RIGHT ATRIUM           Index LA diam:      4.30 cm 2.09 cm/m  RA Area:     17.80 cm LA Vol (A4C): 49.5 ml 24.04 ml/m RA Volume:   45.40 ml  22.05 ml/m  AORTIC VALVE LVOT Vmax:   75.00 cm/s LVOT Vmean:  51.800 cm/s LVOT VTI:    0.165 m  AORTA Ao Root diam: 2.90 cm MITRAL VALVE                        TRICUSPID VALVE MV Area (PHT): 2.16 cm             TR Peak grad:   25.8 mmHg MV Decel Time: 352 msec             TR Vmax:        254.00 cm/s MV E velocity: 45.80 cm/s 103 cm/s MV A velocity: 62.10 cm/s 70.3 cm/s SHUNTS MV E/A ratio:  0.74       1.5       Systemic VTI:  0.16 m                                     Systemic Diam: 2.20 cm Loralie Champagne MD Electronically signed by Loralie Champagne MD Signature Date/Time: 03/23/2019/3:23:23 PM    Final    IR ENDOVASC INTRACRANIAL INF OTHER THAN THROMBO ART INC DIAG ANGIO  Result Date: 03/24/2019 INDICATION: New onset of slurred speech and left-sided weakness. Left-sided neglect. CT angiogram of the head and neck revealing a pre occlusive severe stenosis of the right internal carotid proximally with angiographic string sign.  Large non occlusive filling defect in the right middle cerebral artery distally extending into the inferior division proximally.  EXAM: 1. EMERGENT LARGE VESSEL OCCLUSION THROMBOLYSIS (anterior CIRCULATION)  COMPARISON:  CT angiogram of the head and neck of March 22, 2019.  MEDICATIONS: Ancef 2 g IV antibiotic was administered within 1 hour of the procedure.  ANESTHESIA/SEDATION: General anesthesia.  CONTRAST:  Omnipaque 300 approximately 120 mL.  FLUOROSCOPY TIME:   Fluoroscopy Time: 76 minutes 48 seconds (1992 mGy).  COMPLICATIONS: None immediate.  TECHNIQUE: Following a full explanation of the procedure along with the potential associated complications, an informed witnessed consent was obtained from the wife. The risks of intracranial hemorrhage of 10%, worsening neurological deficit, ventilator dependency, death and inability to revascularize were all reviewed in detail with the patient's spouse.  The patient was then put under general anesthesia by the Department of Anesthesiology at [facility].  The right groin was prepped and draped in the usual sterile fashion. Thereafter using modified Seldinger technique, transfemoral access into the right common femoral artery was obtained without difficulty. Over a 0.035 inch guidewire a 5 French Pinnacle sheath was inserted. Through this, and also over a 0.035 inch guidewire a 5 [place] JB 1 catheter was advanced to the aortic arch region and selectively positioned in the innominate artery and the right common carotid artery.  FINDINGS: The innominate artery angiogram demonstrates the origin of the right subclavian artery and the right common carotid artery to be widely patent.  There is mild narrowing of the origin of the right vertebral artery.  The vessel is, otherwise,  seen to opacify to the cranial skull base to the level of the right posterior-inferior cerebellar artery.  The right common carotid arteriogram demonstrates the origin the right external carotid artery and its major branches to be widely patent.  The right internal carotid artery at the bulb and just distally demonstrates severe pre occlusive stenosis with an angiographic string sign.  No focal area of severe stenosis also noted in the right internal carotid artery proximally. More distally there is decreased caliber of the right internal carotid artery to the cranial skull base. The left common carotid arteriogram demonstrates the left external  carotid artery and its major branches to be widely patent.  The right internal carotid artery at the bulb demonstrates a high-grade stenosis of approximately 75% in the lateral projection associated with a segmental atherosclerotic plaque.  More distally the vessel is seen to opacify to the cranial skull base. The petrous, cavernous and the supraclinoid left ICA demonstrates wide patency.  The left anterior and the left middle cerebral artery opacify into the capillary and venous phases. There is mild to moderate narrowing of the left anterior cerebral A1 segment.  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  Delayed arterial and capillary phases demonstrate opacification of the right anterior cerebral artery territory distal to the A2 segment.  PROCEDURE: The diagnostic JB 1 catheter in the right common carotid artery was then exchanged over a 0.035 inch 300 cm Rosen exchange guidewire for an 8 [place] Pinnacle sheath in the right groin which was then connected to continuous heparinized saline infusion. The patient was loaded with aspirin 81 mg, and Brilinta 180 mg via an orogastric tube in anticipation for revascularization of the right internal carotid artery proximally.  Over the exchange guidewire, an 087 balloon guide catheter which had been prepped with 50% contrast and 50% heparinized saline infusion was advanced and positioned just proximal to the right common carotid bifurcation. The guidewire was removed. Good aspiration was obtained from the hub of the balloon guide catheter. A gentle control arteriogram performed through this continued to demonstrate no change in the severely pre occlusive narrowing of the right internal carotid artery at the bulb, and also in the proximal right  internal carotid artery.  Over a 0.014 inch standard Synchro micro guidewire, an 021 Trevo ProVue microcatheter was advanced to the distal end of the balloon guide catheter. The micro guidewire was then gently manipulated using a torque device and advanced without difficulty to the cervical petrous junction. However, advancement of the 021 Trevo ProVue micro catheter was met with significant resistance at the upper tandem calcified lesion.  This was then replaced instead with a 017 Headway 2 tip microcatheter which was again advanced over a 0.014 inch standard Synchro micro guidewire without difficulty to the cervical petrous junction.  The micro guidewire was removed. Good aspiration obtained from the hub of the microcatheter. This in turn was then exchanged for an 014 inch softip Transend 300 cm exchange micro guidewire with a J configuration.  Control arteriogram performed through the balloon guide catheter demonstrated improved flow through the right internal carotid artery proximally and distally. A 4 mm x 30 mm Viatrac 14 balloon angioplasty catheter was then prepped and purged with heparinized saline infusion.  Using the rapid exchange technique this was then advanced and positioned  without difficulty across the proximal right ICA stenosis.  Control inflation was then carried out using micro inflation syringe device via micro tubing to the normal pressure where it was maintained for approximately 20 seconds. At this time, there was significant bradycardia noted. This prompted immediate deflation and retrieval of the balloon whilst the exchange micro guidewire was kept and positioned in the petrous segment of the right internal carotid artery.  A control arteriogram performed through the balloon guide catheter in the right common carotid artery now demonstrated significantly improved caliber and flow through the proximally angioplastied segment of the right internal carotid artery now revealing the  significant stenosis distal to this involving the calcified plaque.  More distally, free flow was seen in the right internal carotid artery to the cranial skull base in the petrous cavernous and supraclinoid segments. The right middle cerebral artery distribution was noted to be widely patent with no evidence of filling defect in the right middle cerebral artery over its distal territories.  A TICI 3 revascularization had been achieved.  There was no visualization of the right anterior cerebral artery A1 segment. This was also noted to be on the CT angiogram of the head and neck performed earlier.  Measurements were then performed of the right internal carotid artery in the most normal appearance angiographically distal to the proximal angioplasty segment, and also the right common carotid artery. It was decided to proceed with placement of a 6/8 mm x 40 mm Xact stent.  The delivery catheter of the stent was then prepped and purged with heparinized saline infusion retrogradely.  Again using the rapid exchange technique, this was then advanced without difficulty to the right common carotid bifurcation.  The distal portion of the stent was positioned such that it was proximal to the more tandem heavily calcified lesion.  This stent was then deployed without difficulty. The delivery system was then retrieved and removed.  A control arteriogram performed through the balloon guide catheter in the right common carotid artery now demonstrates significantly improved caliber and flow through the stented segment and also through the internal carotid artery proximally and distally.  There continued to be the calcified significant stenosis involving the proximal right ICA as mentioned earlier.  Measurements were performed of the right internal carotid artery in the C1-C2 junction, and also aspirate was the noted in the right internal carotid artery at the proximal aspect of the initial stent.  Over the exchange  micro guidewire, a 4 mm x 22 mm Resolute Onyx drug-eluting balloon mounted stent was prepped and purged with heparinized saline infusion and also with 50% contrast and 50% heparinized saline infusion.  This was then advanced again using the rapid exchange technique without difficulty into the distal portion of the initial stent. However, this stent could not be advanced into the C1-C2 junction on account of the severe calcific plaque.  This is most likely related to calcified ledge protruding intraluminally.  After multiple attempts, the balloon mounted stent delivery system was retrieved and removed.  It was, therefore, decided to proceed with balloon angioplasty. A TREK 3 mm x 12 mm coronary angioplasty balloon catheter was prepped and purged with heparinized saline infusion. Using the rapid exchange technique, this was advanced without difficulty and positioned in the distal and proximal markers at the site of the severe high-grade stenosis. A control angioplasty was then performed again with a micro inflation syringe device via micro tubing. This was carried to 6 atmospheres where it was maintained for approximately  90 seconds. Upon deflation and proximal retrieval, a control arteriogram performed through the balloon guide catheter in the right common carotid artery now demonstrated significantly improved caliber and flow through the angioplastied segment also distally.  It was elected to try advancement of the previously Resolute Onyx 4 x 22 mm balloon mounted stent in view of the improved caliber following the angioplasty.  At this time the stent delivery catheter was advanced more distally. However, further advancement was again met middle with significant resistance secondary to calcified plaque.  Again after multiple trials, this was abandoned.  Control arteriogram performed through the balloon guide catheter in the right common carotid artery continued to demonstrate significantly improved caliber  and flow through the angioplastied segment of the right internal carotid artery.  There was approximately 80% patency noted at the site of the angioplasty.  10 minute control arteriogram performed through the balloon guide catheter continued to demonstrate significantly improved caliber and flow through the angioplastied segment.  A control arteriogram performed intracranially continued to demonstrate a completely revascularized right middle cerebral artery distribution. An anterior cerebral artery A1 segment again was visualized.  The balloon guide was then retrieved and removed. The 8 French sheath was left in place and connected to continuous heparinized saline infusion. No evidence of hematoma or bleeding was seen. The distal pulses remained Dopplerable in the dorsalis pedis, and the posterior tibial regions bilaterally unchanged.  The patient was also left intubated on account of the IV tPA the patient had had, in the large-bore Pinnacle sheath.  The patient was also given approximately 7.5 mg of Integrilin during the course of the treatment involving the right ICA angioplasty tandem lesion. Additionally it was decided to infuse Aggrastat over approximately 12 hours in order to obviate the potential for acute platelet aggregation at the site of the angioplasty, and also the stent.  A flat panel CT of the brain demonstrated no evidence of gross midline shift or of hemorrhage.  The patient was then transferred to the neuro ICU to continue with post revascularization management.  IMPRESSION: Complete revascularization of previously noted large filling defect in the right middle cerebral artery distally extending into the proximal inferior division following proximal revascularization, and use of approximately 7.5 mg with a TICI 3 revascularization of the right middle cerebral artery distribution.  Status post endovascular revascularization of symptomatic near complete occlusion of the right internal  carotid artery proximally with a string sign with placement of stent following balloon angioplasty as described.  Status post endovascular balloon angioplasty of significant stenosis secondary to a calcified atherosclerotic plaque involving the proximal and middle 1/3 of the right internal carotid artery with patency of 80%.  Non visualization of the right anterior cerebral artery A1 segment. Opacification of the right anterior cerebral artery at the level of the A2 segment from a communication with the left anterior cerebral artery A2 segment. 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  PLAN: Follow-up in the clinic 4 weeks post discharge.   Electronically Signed   By: Luanne Bras M.D.   On: 03/23/2019 15:13  CT HEAD CODE STROKE WO CONTRAST  Result Date: 03/23/2019 CLINICAL DATA:  Code stroke. 86 year old male with left facial droop and aphasia. EXAM: CT HEAD WITHOUT CONTRAST TECHNIQUE: Contiguous axial images were obtained from the base of the skull through the vertex without intravenous contrast. COMPARISON:  Head CT 12/25/2016. FINDINGS: Brain: Cerebral volume is not significantly changed since 2018. No  midline shift, mass effect, or evidence of intracranial mass lesion. No ventriculomegaly. No acute intracranial hemorrhage identified. Scattered bilateral cerebral white matter hypodensity has increased. The deep gray nuclei, brainstem and cerebellum remain within normal limits. No cortically based acute infarct identified. Vascular: Calcified atherosclerosis at the skull base. There is asymmetric right MCA M1 or proximal M2 hyperdensity best seen on series 5, image 29 which is new from 2018. Skull: Negative. Sinuses/Orbits: Visualized paranasal sinuses and mastoids are stable and well pneumatized. Other: Negative orbit and scalp soft tissues. ASPECTS [facility] Stroke Program Early CT Score) Total score (0-10 with 10 being normal): 10  IMPRESSION: 1. Hyperdense Right MCA suspicious for emergent large vessel occlusion. But no CT changes of acute cortically based infarct identified. ASPECTS 10. 2. No acute intracranial hemorrhage. Increased scattered white matter hypodensity since 2018. 3. These results were communicated to Dr. [name] at 1:14 pmon 02/08/2021by text page via the [facility] messaging system. Electronically Signed   By: Genevie Ann M.D.   On: 03/21/2019 13:16   [place] EKG SITE RITE  Result Date: 03/24/2019 If Site Rite image not attached, placement could not be confirmed due to current cardiac rhythm.  IR ANGIO INTRA EXTRACRAN SEL COM CAROTID INNOMINATE UNI L MOD SED  Result Date: 03/19/2019 INDICATION: New onset of slurred speech and left-sided weakness. Left-sided neglect. CT angiogram of the head and neck revealing a pre occlusive severe stenosis of the right internal carotid proximally with angiographic string sign.  Large non occlusive filling defect in the right middle cerebral artery distally extending into the inferior division proximally.  EXAM: 1. EMERGENT LARGE VESSEL OCCLUSION THROMBOLYSIS (anterior CIRCULATION)  COMPARISON:  CT angiogram of the head and neck of March 22, 2019.  MEDICATIONS: Ancef 2 g IV antibiotic was administered within 1 hour of the procedure.  ANESTHESIA/SEDATION: General anesthesia.  CONTRAST:  Omnipaque 300 approximately 120 mL.  FLUOROSCOPY TIME:  Fluoroscopy Time: 76 minutes 48 seconds (1992 mGy).  COMPLICATIONS: None immediate.  TECHNIQUE: Following a full explanation of the procedure along with the potential associated complications, an informed witnessed consent was obtained from the wife. The risks of intracranial hemorrhage of 10%, worsening neurological deficit, ventilator dependency, death and inability to revascularize were all reviewed in detail with the patient's spouse.  The patient was then put under general anesthesia by the Department of Anesthesiology at [facility].  The  right groin was prepped and draped in the usual sterile fashion. Thereafter using modified Seldinger technique, transfemoral access into the right common femoral artery was obtained without difficulty. Over a 0.035 inch guidewire a 5 French Pinnacle sheath was inserted. Through this, and also over a 0.035 inch guidewire a 5 [place] JB 1 catheter was advanced to the aortic arch region and selectively positioned in the innominate artery and the right common carotid artery.  FINDINGS: The innominate artery angiogram demonstrates the origin of the right subclavian artery and the right common carotid artery to be widely patent.  There is mild narrowing of the origin of the right vertebral artery.  The vessel is, otherwise, seen to opacify to the cranial skull base to the level of the right posterior-inferior cerebellar artery.  The right common carotid arteriogram demonstrates the origin the right external carotid artery and its major branches to be widely patent.  The right internal carotid artery at the bulb and just distally demonstrates severe pre occlusive stenosis with an angiographic string sign.  No focal area of severe stenosis also noted in the right internal  carotid artery proximally. More distally there is decreased caliber of the right internal carotid artery to the cranial skull base. The left common carotid arteriogram demonstrates the left external carotid artery and its major branches to be widely patent.  The right internal carotid artery at the bulb demonstrates a high-grade stenosis of approximately 75% in the lateral projection associated with a segmental atherosclerotic plaque.  More distally the vessel is seen to opacify to the cranial skull base. The petrous, cavernous and the supraclinoid left ICA demonstrates wide patency.  The left anterior and the left middle cerebral artery opacify into the capillary and venous phases. There is mild to moderate narrowing of the left anterior cerebral  A1 segment.  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  Delayed arterial and capillary phases demonstrate opacification of the right anterior cerebral artery territory distal to the A2 segment.  PROCEDURE: The diagnostic JB 1 catheter in the right common carotid artery was then exchanged over a 0.035 inch 300 cm Rosen exchange guidewire for an 8 [place] Pinnacle sheath in the right groin which was then connected to continuous heparinized saline infusion. The patient was loaded with aspirin 81 mg, and Brilinta 180 mg via an orogastric tube in anticipation for revascularization of the right internal carotid artery proximally.  Over the exchange guidewire, an 087 balloon guide catheter which had been prepped with 50% contrast and 50% heparinized saline infusion was advanced and positioned just proximal to the right common carotid bifurcation. The guidewire was removed. Good aspiration was obtained from the hub of the balloon guide catheter. A gentle control arteriogram performed through this continued to demonstrate no change in the severely pre occlusive narrowing of the right internal carotid artery at the bulb, and also in the proximal right internal carotid artery.  Over a 0.014 inch standard Synchro micro guidewire, an 021 Trevo ProVue microcatheter was advanced to the distal end of the balloon guide catheter. The micro guidewire was then gently manipulated using a torque device and advanced without difficulty to the cervical petrous junction. However, advancement of the 021 Trevo ProVue micro catheter was met with significant resistance at the upper tandem calcified lesion.  This was then replaced instead with a 017 Headway 2 tip microcatheter which was again advanced over a 0.014 inch  standard Synchro micro guidewire without difficulty to the cervical petrous junction.  The micro guidewire was removed. Good aspiration obtained from the hub of the microcatheter. This in turn was then exchanged for an 014 inch softip Transend 300 cm exchange micro guidewire with a J configuration.  Control arteriogram performed through the balloon guide catheter demonstrated improved flow through the right internal carotid artery proximally and distally. A 4 mm x 30 mm Viatrac 14 balloon angioplasty catheter was then prepped and purged with heparinized saline infusion.  Using the rapid exchange technique this was then advanced and positioned without difficulty across the proximal right ICA stenosis.  Control inflation was then carried out using micro inflation syringe device via micro tubing to the normal pressure where it was maintained for approximately 20 seconds. At this time, there was significant bradycardia noted. This prompted immediate deflation and retrieval of the balloon whilst the exchange micro guidewire was kept and positioned in the petrous segment of the right internal carotid artery.  A control  arteriogram performed through the balloon guide catheter in the right common carotid artery now demonstrated significantly improved caliber and flow through the proximally angioplastied segment of the right internal carotid artery now revealing the significant stenosis distal to this involving the calcified plaque.  More distally, free flow was seen in the right internal carotid artery to the cranial skull base in the petrous cavernous and supraclinoid segments. The right middle cerebral artery distribution was noted to be widely patent with no evidence of filling defect in the right middle cerebral artery over its distal territories.  A TICI 3 revascularization had been achieved.  There was no visualization of the right anterior cerebral artery A1 segment. This was also noted to be on the CT  angiogram of the head and neck performed earlier.  Measurements were then performed of the right internal carotid artery in the most normal appearance angiographically distal to the proximal angioplasty segment, and also the right common carotid artery. It was decided to proceed with placement of a 6/8 mm x 40 mm Xact stent.  The delivery catheter of the stent was then prepped and purged with heparinized saline infusion retrogradely.  Again using the rapid exchange technique, this was then advanced without difficulty to the right common carotid bifurcation.  The distal portion of the stent was positioned such that it was proximal to the more tandem heavily calcified lesion.  This stent was then deployed without difficulty. The delivery system was then retrieved and removed.  A control arteriogram performed through the balloon guide catheter in the right common carotid artery now demonstrates significantly improved caliber and flow through the stented segment and also through the internal carotid artery proximally and distally.  There continued to be the calcified significant stenosis involving the proximal right ICA as mentioned earlier.  Measurements were performed of the right internal carotid artery in the C1-C2 junction, and also aspirate was the noted in the right internal carotid artery at the proximal aspect of the initial stent.  Over the exchange micro guidewire, a 4 mm x 22 mm Resolute Onyx drug-eluting balloon mounted stent was prepped and purged with heparinized saline infusion and also with 50% contrast and 50% heparinized saline infusion.  This was then advanced again using the rapid exchange technique without difficulty into the distal portion of the initial stent. However, this stent could not be advanced into the C1-C2 junction on account of the severe calcific plaque.  This is most likely related to calcified ledge protruding intraluminally.  After multiple attempts, the balloon mounted  stent delivery system was retrieved and removed.  It was, therefore, decided to proceed with balloon angioplasty. A TREK 3 mm x 12 mm coronary angioplasty balloon catheter was prepped and purged with heparinized saline infusion. Using the rapid exchange technique, this was advanced without difficulty and positioned in the distal and proximal markers at the site of the severe high-grade stenosis. A control angioplasty was then performed again with a micro inflation syringe device via micro tubing. This was carried to 6 atmospheres where it was maintained for approximately 90 seconds. Upon deflation and proximal retrieval, a control arteriogram performed through the balloon guide catheter in the right common carotid artery now demonstrated significantly improved caliber and flow through the angioplastied segment also distally.  It was elected to try advancement of the previously Resolute Onyx 4 x 22 mm balloon mounted stent in view of the improved caliber following the angioplasty.  At this time the stent delivery catheter was advanced more  distally. However, further advancement was again met middle with significant resistance secondary to calcified plaque.  Again after multiple trials, this was abandoned.  Control arteriogram performed through the balloon guide catheter in the right common carotid artery continued to demonstrate significantly improved caliber and flow through the angioplastied segment of the right internal carotid artery.  There was approximately 80% patency noted at the site of the angioplasty.  10 minute control arteriogram performed through the balloon guide catheter continued to demonstrate significantly improved caliber and flow through the angioplastied segment.  A control arteriogram performed intracranially continued to demonstrate a completely revascularized right middle cerebral artery distribution. An anterior cerebral artery A1 segment again was visualized.  The balloon guide was  then retrieved and removed. The 8 French sheath was left in place and connected to continuous heparinized saline infusion. No evidence of hematoma or bleeding was seen. The distal pulses remained Dopplerable in the dorsalis pedis, and the posterior tibial regions bilaterally unchanged.  The patient was also left intubated on account of the IV tPA the patient had had, in the large-bore Pinnacle sheath.  The patient was also given approximately 7.5 mg of Integrilin during the course of the treatment involving the right ICA angioplasty tandem lesion. Additionally it was decided to infuse Aggrastat over approximately 12 hours in order to obviate the potential for acute platelet aggregation at the site of the angioplasty, and also the stent.  A flat panel CT of the brain demonstrated no evidence of gross midline shift or of hemorrhage.  The patient was then transferred to the neuro ICU to continue with post revascularization management.  IMPRESSION: Complete revascularization of previously noted large filling defect in the right middle cerebral artery distally extending into the proximal inferior division following proximal revascularization, and use of approximately 7.5 mg with a TICI 3 revascularization of the right middle cerebral artery distribution.  Status post endovascular revascularization of symptomatic near complete occlusion of the right internal carotid artery proximally with a string sign with placement of stent following balloon angioplasty as described.  Status post endovascular balloon angioplasty of significant stenosis secondary to a calcified atherosclerotic plaque involving the proximal and middle 1/3 of the right internal carotid artery with patency of 80%.  Non visualization of the right anterior cerebral artery A1 segment. Opacification of the right anterior cerebral artery at the level of the A2 segment from a communication with the left anterior cerebral artery A2 segment. 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  PLAN: Follow-up in the clinic 4 weeks post discharge.   Electronically Signed   By: Luanne Bras M.D.   On: 03/23/2019 15:13  IR ANGIO VERTEBRAL SEL SUBCLAVIAN INNOMINATE UNI L MOD SED  Result Date: 03/31/2019 INDICATION: New onset of slurred speech and left-sided weakness. Left-sided neglect. CT angiogram of the head and neck revealing a pre occlusive severe stenosis of the right internal carotid proximally with angiographic string sign.  Large non occlusive filling defect in the right middle cerebral artery distally extending into the inferior division proximally.  EXAM: 1. EMERGENT LARGE VESSEL OCCLUSION THROMBOLYSIS (anterior CIRCULATION)  COMPARISON:  CT angiogram of the head and neck of March 22, 2019.  MEDICATIONS: Ancef 2 g IV antibiotic was administered within 1 hour of the procedure.  ANESTHESIA/SEDATION: General anesthesia.  CONTRAST:  Omnipaque 300 approximately 120 mL.  FLUOROSCOPY TIME:  Fluoroscopy Time: 76 minutes 48 seconds (1992 mGy).  COMPLICATIONS: None immediate.  TECHNIQUE:  Following a full explanation of the procedure along with the potential associated complications, an informed witnessed consent was obtained from the wife. The risks of intracranial hemorrhage of 10%, worsening neurological deficit, ventilator dependency, death and inability to revascularize were all reviewed in detail with the patient's spouse.  The patient was then put under general anesthesia by the Department of Anesthesiology at [facility].  The right groin was prepped and draped in the usual sterile fashion. Thereafter using modified Seldinger technique, transfemoral access into the right common femoral artery was obtained without difficulty. Over a 0.035 inch guidewire a 5 French Pinnacle sheath was inserted. Through this, and also over a 0.035 inch guidewire a 5 [place] JB 1 catheter was advanced to  the aortic arch region and selectively positioned in the innominate artery and the right common carotid artery.  FINDINGS: The innominate artery angiogram demonstrates the origin of the right subclavian artery and the right common carotid artery to be widely patent.  There is mild narrowing of the origin of the right vertebral artery.  The vessel is, otherwise, seen to opacify to the cranial skull base to the level of the right posterior-inferior cerebellar artery.  The right common carotid arteriogram demonstrates the origin the right external carotid artery and its major branches to be widely patent.  The right internal carotid artery at the bulb and just distally demonstrates severe pre occlusive stenosis with an angiographic string sign.  No focal area of severe stenosis also noted in the right internal carotid artery proximally. More distally there is decreased caliber of the right internal carotid artery to the cranial skull base. The left common carotid arteriogram demonstrates the left external carotid artery and its major branches to be widely patent.  The right internal carotid artery at the bulb demonstrates a high-grade stenosis of approximately 75% in the lateral projection associated with a segmental atherosclerotic plaque.  More distally the vessel is seen to opacify to the cranial skull base. The petrous, cavernous and the supraclinoid left ICA demonstrates wide patency.  The left anterior and the left middle cerebral artery opacify into the capillary and venous phases. There is mild to moderate narrowing of the left anterior cerebral A1 segment.  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  Delayed arterial and capillary phases demonstrate opacification of the right  anterior cerebral artery territory distal to the A2 segment.  PROCEDURE: The diagnostic JB 1 catheter in the right common carotid artery was then exchanged over a 0.035 inch 300 cm Rosen exchange guidewire for an 8 [place] Pinnacle sheath in the right groin which was then connected to continuous heparinized saline infusion. The patient was loaded with aspirin 81 mg, and Brilinta 180 mg via an orogastric tube in anticipation for revascularization of the right internal carotid artery proximally.  Over the exchange guidewire, an 087 balloon guide catheter which had been prepped with 50% contrast and 50% heparinized saline infusion was advanced and positioned just proximal to the right common carotid bifurcation. The guidewire was removed. Good aspiration was obtained from the hub of the balloon guide catheter. A gentle control arteriogram performed through this continued to demonstrate no change in the severely pre occlusive narrowing of the right internal carotid artery at the bulb, and also in the proximal right internal carotid artery.  Over a 0.014 inch standard Synchro micro guidewire, an 021 Trevo ProVue microcatheter was advanced to the distal end of the balloon guide catheter. The micro guidewire was then gently manipulated using a torque device and advanced without difficulty to the cervical petrous junction. However, advancement of the 021 Trevo ProVue micro catheter was met with significant resistance at the upper tandem calcified lesion.  This was then replaced instead with a 017 Headway 2 tip microcatheter which was again advanced over a 0.014 inch standard Synchro micro guidewire without difficulty to the cervical petrous junction.  The micro guidewire was removed. Good aspiration obtained from the hub of the microcatheter. This in turn was then exchanged for an 014 inch softip Transend 300 cm exchange micro guidewire with a J configuration.  Control arteriogram performed through the balloon guide  catheter demonstrated improved flow through the right internal carotid artery proximally and distally. A 4 mm x 30 mm Viatrac 14 balloon angioplasty catheter was then prepped and purged with heparinized saline infusion.  Using the rapid exchange technique this was then advanced and positioned without difficulty across the proximal right ICA stenosis.  Control inflation was then carried out using micro inflation syringe device via micro tubing to the normal pressure where it was maintained for approximately 20 seconds. At this time, there was significant bradycardia noted. This prompted immediate deflation and retrieval of the balloon whilst the exchange micro guidewire was kept and positioned in the petrous segment of the right internal carotid artery.  A control arteriogram performed through the balloon guide catheter in the right common carotid artery now demonstrated significantly improved caliber and flow through the proximally angioplastied segment of the right internal carotid artery now revealing the significant stenosis distal to this involving the calcified plaque.  More distally, free flow was seen in the right internal carotid artery to the cranial skull base in the petrous cavernous and supraclinoid segments. The right middle cerebral artery distribution was noted to be widely patent with no evidence of filling defect in the right middle cerebral artery over its distal territories.  A TICI 3 revascularization had been achieved.  There was no visualization of the right anterior cerebral artery A1 segment. This was also noted to be on the CT angiogram of the head and neck performed earlier.  Measurements were then performed of the right internal carotid artery in the most normal appearance angiographically distal to the proximal angioplasty segment, and also the right common carotid artery. It was decided to proceed with placement of a 6/8 mm x 40 mm Xact stent.  The delivery catheter of the stent  was then prepped and purged with heparinized saline infusion retrogradely.  Again using the rapid exchange technique, this was then advanced without difficulty to the right common carotid bifurcation.  The distal portion of the stent was positioned such that it was proximal to the more tandem heavily calcified lesion.  This stent was then deployed without difficulty. The delivery system was then retrieved and removed.  A control arteriogram performed through the balloon guide catheter in the right common carotid artery now demonstrates significantly improved caliber and flow through the stented segment and also through the internal carotid artery proximally and distally.  There continued to be the calcified significant stenosis involving the proximal right ICA as mentioned earlier.  Measurements were performed of the right internal carotid artery in the C1-C2 junction, and also aspirate was the noted in the right internal carotid artery at the proximal aspect of the  initial stent.  Over the exchange micro guidewire, a 4 mm x 22 mm Resolute Onyx drug-eluting balloon mounted stent was prepped and purged with heparinized saline infusion and also with 50% contrast and 50% heparinized saline infusion.  This was then advanced again using the rapid exchange technique without difficulty into the distal portion of the initial stent. However, this stent could not be advanced into the C1-C2 junction on account of the severe calcific plaque.  This is most likely related to calcified ledge protruding intraluminally.  After multiple attempts, the balloon mounted stent delivery system was retrieved and removed.  It was, therefore, decided to proceed with balloon angioplasty. A TREK 3 mm x 12 mm coronary angioplasty balloon catheter was prepped and purged with heparinized saline infusion. Using the rapid exchange technique, this was advanced without difficulty and positioned in the distal and proximal markers at the site  of the severe high-grade stenosis. A control angioplasty was then performed again with a micro inflation syringe device via micro tubing. This was carried to 6 atmospheres where it was maintained for approximately 90 seconds. Upon deflation and proximal retrieval, a control arteriogram performed through the balloon guide catheter in the right common carotid artery now demonstrated significantly improved caliber and flow through the angioplastied segment also distally.  It was elected to try advancement of the previously Resolute Onyx 4 x 22 mm balloon mounted stent in view of the improved caliber following the angioplasty.  At this time the stent delivery catheter was advanced more distally. However, further advancement was again met middle with significant resistance secondary to calcified plaque.  Again after multiple trials, this was abandoned.  Control arteriogram performed through the balloon guide catheter in the right common carotid artery continued to demonstrate significantly improved caliber and flow through the angioplastied segment of the right internal carotid artery.  There was approximately 80% patency noted at the site of the angioplasty.  10 minute control arteriogram performed through the balloon guide catheter continued to demonstrate significantly improved caliber and flow through the angioplastied segment.  A control arteriogram performed intracranially continued to demonstrate a completely revascularized right middle cerebral artery distribution. An anterior cerebral artery A1 segment again was visualized.  The balloon guide was then retrieved and removed. The 8 French sheath was left in place and connected to continuous heparinized saline infusion. No evidence of hematoma or bleeding was seen. The distal pulses remained Dopplerable in the dorsalis pedis, and the posterior tibial regions bilaterally unchanged.  The patient was also left intubated on account of the IV tPA the patient  had had, in the large-bore Pinnacle sheath.  The patient was also given approximately 7.5 mg of Integrilin during the course of the treatment involving the right ICA angioplasty tandem lesion. Additionally it was decided to infuse Aggrastat over approximately 12 hours in order to obviate the potential for acute platelet aggregation at the site of the angioplasty, and also the stent.  A flat panel CT of the brain demonstrated no evidence of gross midline shift or of hemorrhage.  The patient was then transferred to the neuro ICU to continue with post revascularization management.  IMPRESSION: Complete revascularization of previously noted large filling defect in the right middle cerebral artery distally extending into the proximal inferior division following proximal revascularization, and use of approximately 7.5 mg with a TICI 3 revascularization of the right middle cerebral artery distribution.  Status post endovascular revascularization of symptomatic near complete occlusion of the right internal carotid artery proximally with  a string sign with placement of stent following balloon angioplasty as described.  Status post endovascular balloon angioplasty of significant stenosis secondary to a calcified atherosclerotic plaque involving the proximal and middle 1/3 of the right internal carotid artery with patency of 80%.  Non visualization of the right anterior cerebral artery A1 segment. Opacification of the right anterior cerebral artery at the level of the A2 segment from a communication with the left anterior cerebral artery A2 segment. 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  PLAN: Follow-up in the clinic 4 weeks post discharge.   Electronically Signed   By: Luanne Bras M.D.   On: 03/23/2019 15:13  { Assessment and Plan:   R MCA infarct s/p tPA and IR and R ICA stent placement - Patient came in 2/7 for left sided weakness and  slurred speech found to have stroke - Possible etiology from large vessel disease  - 2D echo with Ef 45% - LDL 80 - A1C 5.9 - started on Brilinta and heparin. ASA was discontinued - per nephrology  Systolic dysfunction - Echo showed mildly reduced EF  45% with G1DD, regional wall motion abnormalities, RV function is mildly reduced, mildly elevated pulm artery pressure, trivial AI, dilated IVC with estimated RA pressure 15 mmHg. No previous echo - Patient has no h/o of MI or coronary stents - EKG shows NSR with non-specific ST/T changes. Hs troponin 27 - Would repeat echo once patient has recovered from acute illness. It he still has wall motion abnormalities than can consider further ischemic work-up.  - would add on BB once off pressors   Diastolic HF - new diagnosis. Echo as above  - appears euvolemic on exam  Vent dependent/Respiratory failure - intubated for IR procedure  - CXR with RLL infiltrates and febrile - CCM following  Carotid stenosis - per CTA neck - s/p right ICA stenting and s/p left ICA angioplasty  Possible Arrhythmia - Possible Afib documented in chart - EKG review shows NSR with PACs - Telemetry shows NSR with frequent ectopy/NSVT and intermittent elevated rates to 120, but no evidence of Afib - Does not likely need heparin as no clear Afib seen - Can consider heart monitor to further investigate for possible afib on discharge  Hypotension/H/o hypertension - home med were valsartan and amlodipine>>on hold and currently on levophed drip  HLD - lipitor 40 mg daily  NSVT - longest run 9 beats - would start BB once off pressors as above   For questions or updates, please contact [facility] Please consult [email or web address] for contact info under    Signed, Cadence Ninfa Meeker, PA-C  03/25/2019 10:45 AM   Patient seen and examined.  Agree with above documentation.  Jonathan Berger is an 86 year old male with a history of tobacco use, hypertension who we are  seeing today for evaluation of systolic dysfunction at the request of Dr. Erlinda Hong.  Patient has no known cardiac history.  He presented to ED on 03/30/2019 with slurred speech and left-sided weakness.  CTA head and neck showed right MCA large vessel occlusion and right ICA critical stenosis.  He was given TPA and underwent right ICA stent placement with IR.  He is currently intubated, and has been requiring pressor support with Levophed.  On exam, patient is intubated and sedated, irregular rhythm, no murmurs, diffuse rhonchi, no LE edema.  EKG shows sinus rhythm with PACs/PVCs, rate 65, low voltage.  On review of telemetry,  no clear atrial fibrillation seen, irregular rhythm appears to be due to sinus rhythm with PACs.  TTE showed EF 45%, with akinesis of mid to apical septum.  For his systolic dysfunction, will plan to start on beta-blocker once he was BP improves and is weaned off pressors.  Beta-blocker would be helpful with his frequent ectopy as well.  Systolic dysfunction likely related to critical illness, will plan to repeat echo after he recovers as outpatient.    Donato Heinz, MD

## 2019-03-25 NOTE — Progress Notes (Signed)
SLP Cancellation Note  Patient Details Name: Jonathan Berger MRN: [id number] DOB: [birth year]   Cancelled treatment:       Reason Eval/Treat Not Completed: Medical issues which prohibited therapy (remains on vent this am)    Mahala Menghini., M.A. CCC-SLP Acute Rehabilitation Services Pager [phone] Office [phone]  03/25/2019, 7:31 AM

## 2019-03-25 NOTE — Progress Notes (Signed)
ANTICOAGULATION CONSULT NOTE - Follow Up Consult  Pharmacy Consult for heparin Indication: Afib in setting of acute CVA  Labs: Recent Labs    03/30/2019 1303 03/31/2019 1303 03/23/2019 1308 03/18/2019 1308 03/23/19 0303 03/23/19 0303 03/23/19 1619 03/23/19 1619 03/23/19 1640 03/24/19 0423 03/24/19 0950 03/24/19 1827 03/24/19 2230  HGB 16.1   < > 16.0   < > 13.6   < > 12.6*   < > 11.9* 12.8*  --   --   --   HCT 48.0   < > 47.0   < > 41.1   < > 38.5*  --  35.0* 39.5  --   --   --   PLT 145*  --   --   --  159  --   --   --   --  130*  --   --   --   APTT 28  --   --   --   --   --   --   --   --   --   --   --   --   LABPROT 12.3  --   --   --   --   --   --   --   --   --   --   --   --   INR 0.9  --   --   --   --   --   --   --   --   --   --   --   --   HEPARINUNFRC  --   --   --   --   --   --   --   --   --   --   --  0.20* 0.25*  CREATININE 1.36*   < > 1.30*  --  1.17  --   --   --   --  1.19  --   --   --   TROPONINIHS  --   --   --   --   --   --   --   --   --   --  27*  --   --    < > = values in this interval not displayed.    Assessment: 86yo male subtherapeutic on heparin with initial dosing for Afib; no gtt issues or signs of bleeding per RN.  Goal of Therapy:  Heparin level 0.3-0.5 units/ml   Plan:  Will increase heparin gtt slightly to 1300 units/hr and check level in 8 hours.    Vernard Gambles, PharmD, BCPS  03/25/2019,12:32 AM

## 2019-03-25 NOTE — Progress Notes (Signed)
Patient with multiple PVCs. E-link notified.

## 2019-03-25 NOTE — Progress Notes (Addendum)
STROKE TEAM PROGRESS NOTE   INTERVAL HISTORY RN is at bedside. Pt still intubated on propofol and fentanyl. He was intermittently agitated on fentanyl and precedex, so the precedex was replaced by propofol. Still has a lot of secretions and on high FiO2, not candidate for extubation. As per RN, pt moving all extremities with agitation and seems equal strength and strong.    Vitals:   03/25/19 0645 03/25/19 0700 03/25/19 0800 03/25/19 0849  BP: (!) 148/69 139/67 127/67 114/64  Pulse: 91 94 (!) 103 82  Resp: 16 12 14 20   Temp:   99.8 F (37.7 C)   TempSrc:   Axillary   SpO2: 95% 96% 96% 92%  Weight:      Height:        CBC:  Recent Labs  Lab 03-25-2019 1303 March 25, 2019 1308 03/23/19 0303 03/23/19 1619 03/24/19 0423 03/25/19 0507  WBC 9.5   < > 14.3*  --  11.4* 9.1  NEUTROABS 5.0  --  10.3*  --   --   --   HGB 16.1   < > 13.6   < > 12.8* 12.3*  HCT 48.0   < > 41.1   < > 39.5 38.6*  MCV 97.8   < > 99.3  --  102.1* 102.9*  PLT 145*   < > 159  --  130* PLATELET CLUMPS NOTED ON SMEAR, UNABLE TO ESTIMATE   < > = values in this interval not displayed.    Basic Metabolic Panel:  Recent Labs  Lab 03/24/19 0423 03/24/19 0423 03/24/19 1827 03/25/19 0507  NA 140  --   --  141  K 3.9  --   --  3.7  CL 111  --   --  114*  CO2 21*  --   --  21*  GLUCOSE 145*  --   --  190*  BUN 13  --   --  17  CREATININE 1.19  --   --  1.32*  CALCIUM 8.4*  --   --  8.5*  MG 2.0   < > 1.7 1.9  PHOS 1.9*   < > 1.5* 1.6*   < > = values in this interval not displayed.   Lipid Panel:     Component Value Date/Time   CHOL 154 03/23/2019 0303   TRIG 109 03/24/2019 2100   HDL 36 (L) 03/23/2019 0303   CHOLHDL 4.3 03/23/2019 0303   VLDL 38 03/23/2019 0303   LDLCALC 80 03/23/2019 0303   HgbA1c:  Lab Results  Component Value Date   HGBA1C 5.9 (H) 03/23/2019   Urine Drug Screen: No results found for: LABOPIA, COCAINSCRNUR, LABBENZ, AMPHETMU, THCU, LABBARB  Alcohol Level No results found for:  ETH  IMAGING past 48 hours MR BRAIN WO CONTRAST  Result Date: 03/23/2019 CLINICAL DATA:  Stroke, follow-up. EXAM: MRI HEAD WITHOUT CONTRAST TECHNIQUE: Multiplanar, multiecho pulse sequences of the brain and surrounding structures were obtained without intravenous contrast. COMPARISON:  Noncontrast CT head, CT angiogram head/neck and CT perfusion 2019-03-25 FINDINGS: Brain: There is an acute cortical/subcortical infarct within the anterolateral right temporal lobe measuring 3.1 x 1.5 cm in transaxial dimensions (series 5, image 67). There are numerous additional small scattered cortical and subcortical infarcts within the right cerebral hemisphere MCA vascular territory involving the right frontal lobe, parietal lobe, occipital lobe and temporal lobe as well as right insula. There is no significant mass effect. No midline shift or extra-axial fluid collection. No chronic intracranial blood products. Moderate patchy  T2/FLAIR hyperintensity within the cerebral white matter is nonspecific, but consistent with chronic small vessel ischemic disease. Moderate generalized parenchymal atrophy. Vascular: SWI signal loss in the region of proximal right M2 MCA branches (series 12, image 23) which may reflect residual thrombus. Skull and upper cervical spine: No focal marrow lesion. Incompletely assessed upper cervical spondylosis. Sinuses/Orbits: Visualized orbits demonstrate no acute abnormality. Paranasal sinus mucosal thickening. Most notably there is moderate mucosal thickening within bilateral ethmoid air cells. Left sphenoid sinus air-fluid level. Small bilateral mastoid effusions. IMPRESSION: 3.1 cm acute cortical/subcortical infarct within the anterolateral right temporal lobe. Numerous additional small scattered acute cortical and subcortical infarct within the right cerebral hemisphere MCA vascular territory. No significant mass effect or evidence of parenchymal hemorrhage. SWI signal loss in the region of  proximal right M2 MCA branches which may reflect residual thrombus. Moderate generalized parenchymal atrophy and chronic small vessel ischemic disease. Paranasal sinus disease as described. Small bilateral mastoid effusions. Electronically Signed   By: Jackey Loge DO   On: 03/23/2019 13:43   DG Chest Port 1 View  Result Date: 03/24/2019 CLINICAL DATA:  ET tube placed EXAM: PORTABLE CHEST 1 VIEW COMPARISON:  08-Apr-2019 FINDINGS: Endotracheal tube with the tip 3.6 cm above the carina. Nasogastric tube coursing below the diaphragm projecting over the stomach. Mild right basilar airspace disease. No pleural effusion or pneumothorax. Stable cardiomediastinal silhouette. No aggressive osseous lesion. IMPRESSION: ET tube with the tip 3.6 cm above the carina. Right lower lobe hazy airspace disease which may reflect atelectasis versus pneumonia. Electronically Signed   By: Elige Ko   On: 03/24/2019 08:47   [place] EKG SITE RITE  Result Date: 03/24/2019 If Site Rite image not attached, placement could not be confirmed due to current cardiac rhythm.  Cerebral Angiogram April 08, 2019 S/P bilateral common carotid arteriograms followed by complete revascularization of RT MCA pre occlusive thrombus following balloon angioplasty with stent placement at prox RT ICA for severe pre occlusive stenosis ,and balloon angioplasty of heavily calcified plaque in proc RT ICAA with a patency  Of approx 80 % .  With TICI 3 revascularization    PHYSICAL EXAM  Temp:  [99.6 F (37.6 C)-101.1 F (38.4 C)] 99.8 F (37.7 C) (02/10 0800) Pulse Rate:  [38-131] 82 (02/10 0849) Resp:  [10-36] 20 (02/10 0849) BP: (93-202)/(47-131) 114/64 (02/10 0849) SpO2:  [87 %-100 %] 92 % (02/10 0849) FiO2 (%):  [60 %-70 %] 60 % (02/10 0849)  General - Well nourished, well developed, intubated on sedation.  Ophthalmologic - fundi not visualized due to noncooperation.  Cardiovascular - irregular heart beats with bradycardia, frequent PVCs and  PACs on tele.  Neuro - intubated on sedation with fentanyl and propofol, eyes open on voice and pain but not following commands, not tracking. With eye opening, eyes in mid position, not consistently blinking to visual threat, doll's eyes sluggish, pupils equal size reactive to light. Corneal reflex present bilaterally, gag and cough present. Breathing over the vent.  Facial symmetry not able to test due to ET tube.  Tongue protrusion not cooperative. On pain stimulation, did not move all extremities. However, pt spontaneously postioning himself and moving BUEs equally to support himself and moving BLEs at least 2/5. DTR 1+ and negative bilateral babinski. Sensation, coordination and gait not tested.   ASSESSMENT/PLAN Jonathan Berger is a 86 y.o. male with history of HTN presenting with slurred speech and L facial droop. Received tPA 04-08-2019 at 1324. Taken for IR for distal R M1  thrombus.   Stroke:   R MCA scattered infarcts s/p tPA and IR w/ R ICA stent placement, infarct secondary to large vessel disease source  CT head hyperdense R MCA.     CTA head & neck R MCA LVO mid and distal segment. Cervical R ICA critical stenosis w/ random string sign stenoses. L ICA distal bulb string sign, severe B VA origin stenoses   Cerebral angio TICI 3 revascularization R MCA preocclusive thrombus w/ angioplasty and stent at proximal R ICA and angioplasty of 80% plaque in L ICA.   MRI  R temporal lobe cortical/ subcortical infarct w/ numerous small scattered R brain infarcts. Proximal R M2 MCA w/ loss of signal likely residual thrombus.    2D Echo EF 45%, no SOE seen  LDL 80  HgbA1c 5.9  P2Y12 = 8  lovenox for VTE prophylaxis  No antithrombotic prior to admission, now on Brilinta (ticagrelor) 90 mg bid and heparin IV. ASA discontinued   Therapy recommendations:  pending   Disposition:  pending   Vent dependent respiratory failure RLL aspiration pneumonia  intubate for IR, left intubated    Sedated   Copious secretion and high FiO2 requirement exclude for extubation today  Weaning off sedation and vent as able  CXR RLL infiltrates  On unasyn 2/9>>  Leukocytosis WBC 11.4->9.1  Fever 101.1->99.8  CCM on board  Carotid stenosis  CTA neck - bilateral ICA bulb string sign  S/p right ICA stenting  S/p left ICA angioplasty  Consider further left ICA intervention in the near future - Dr. Estanislado Pandy aware  PAF  Intermittently on tele  EKG showed junctional vs. afib  Currently tele showed frequent PACs and PVCs  On heparin IV  Close monitoring Hb and platelet  Hx of Hypertension hypotension  Home meds:  Valsartan-HCTZ 160-12.5, amlodipine 5  On levophed gtt . BP goal 110-180  . Wean off pressor as able . Long-term BP goal normotensive  Hyperlipidemia  Home meds:  No statin  LDL 80, goal < 70  On lipitor 40   Continue statin at discharge  Dysphagia . Secondary to stroke . NPO . On TF @ 45 . Speech to see once extubated  Other Stroke Risk Factors  Advanced age  Former Cigarette smoker, quit 45 yrs ago  ETOH use, will advise to drink no more than 2 drink(s) a day   Other Active Problems  Dementia, on aricept and namenda  Urinary retention, foley placed  Thrombocytopenia - platelet 159->130  Hospital day # 3  This patient is critically ill due to right MCA stroke, bilateral carotid stenosis, status post TPA, and IR, respiratory failure and at significant risk of neurological worsening, death form recurrent stroke, hemorrhagic conversion, seizure, heart failure. This patient's care requires constant monitoring of vital signs, hemodynamics, respiratory and cardiac monitoring, review of multiple databases, neurological assessment, discussion with family, other specialists and medical decision making of high complexity. I spent 35 minutes of neurocritical care time in the care of this patient.    Rosalin Hawking, MD PhD Stroke  Neurology 03/25/2019 9:18 AM  To contact Stroke Continuity provider, please refer to [email or web address] After hours, contact General Neurology

## 2019-03-25 NOTE — Progress Notes (Signed)
eLink Physician-Brief Progress Note Patient Name: Jonathan Berger DOB: 1931-12-30 MRN: [id number]   Date of Service  03/25/2019  HPI/Events of Note  Pt now has a PICC line and needs parameters for Norepinephrine infusion adjusted.  eICU Interventions  Norepinephrine infusion parameters changed to 0-40 mcg as needed to maintain MAP > 65 mmHg.        Thomasene Lot Corion Sherrod 03/25/2019, 2:28 AM

## 2019-03-25 NOTE — Progress Notes (Signed)
PT Cancellation Note  Patient Details Name: Jonathan Berger MRN: [id number] DOB: Apr 27, 1931   Cancelled Treatment:    Reason Eval/Treat Not Completed: Medical issues which prohibited therapy; remains sedated on vent and with soft BP.  Will attempt another day.   [name] 03/25/2019, 11:14 AM  Sheran Lawless, PT Acute Rehabilitation Services [phone] 03/25/2019

## 2019-03-25 NOTE — Progress Notes (Signed)
PULMONARY / CRITICAL CARE MEDICINE   NAME:  DUILIO HERITAGE, MRN:  [id number], DOB:  07-02-1931, LOS: 3 ADMISSION DATE:  04/02/2019, CONSULTATION DATE: 04/09/2019 REFERRING MD: Interventional radiology neurology, CHIEF COMPLAINT: Vent dependent respiratory failure secondary to procedure  BRIEF HISTORY:    86 year old presenting 2/7 with slurred speech and dysphagia found to have acute right middle cerebral artery stroke s/p tPA and EVR who returns to ICU on mechanical ventilation overnight.   SIGNIFICANT PAST MEDICAL HISTORY   Hypertension  SIGNIFICANT EVENTS:  04/03/2019 right MCA stroke  STUDIES:   2/7 CTH >>  Hyperdense Right MCA suspicious for emergent large vessel occlusion.  No acute intracranial hemorrhage. 2/7 CTA head/ neck>> Positive for Right MCA Emergent Large Vessel Occlusion affecting the mid and distal M1 segment. Critical Stenosis cervical Right ICA beginning at the origin, throughout the proximal 3.5 cm of the vessel. There are tandem RADIOGRAPHIC STRING SIGN STENOSES. Additional severe atherosclerosis and multifocal high-grade arterial stenoses elsewhere: - Left ICA distal bulb RADIOGRAPHIC STRING SIGN STENOSIS. - Severe bilateral Vertebral Artery origin stenoses. - proximal Left Subclavian Artery moderate stenosis due to plaque and tortuosity. 2/8 TTE >>  Left ventricular ejection fraction, by estimation, is 45%. The left ventricle has mildly decreased function. The left ventrical demonstrates regional wall motion abnormalities (see scoring diagram/findings for description). Akinesis of the mid to apical anteroseptal and inferoseptal walls. Left ventricular diastolic parameters are consistent with Grade I diastolic dysfunction (impaired relaxation). 2. Right ventricular systolic function is mildly reduced. 2/8 MRI Brain >> 3.1 cm acute cortical/subcortical infarct within the anterolateral right temporal lobe. Numerous additional small scattered acute cortical and subcortical infarct  within the right cerebral hemisphere MCA vascular territory.  CULTURES:  2/7 SARS2/ Flu A/B >> neg 2/8 urine > 2/8 resp >  ANTIBIOTICS:  2/7 cefazolin pre op 2/9 unasyn >>  LINES/TUBES:  03/29/2019 endotracheal tube >>  04/02/2019 left radial A-line >>2/9 03/31/2019 right femoral sheath >>2/9  CONSULTANTS:  04/09/2019 pulmonary critical care   SUBJECTIVE/INTERVAL:  Agitated overnight and sedation switched to propofol. Remains on 97mcg levophed.                       CONSTITUTIONAL: BP 139/67   Pulse 94   Temp 99.6 F (37.6 C) (Axillary)   Resp 12   Ht 5\' 11"  (1.803 m)   Wt 85.8 kg   SpO2 96%   BMI 26.38 kg/m   I/O last 3 completed shifts: In: 3652.2 [I.V.:2523.8; NG/GT:808.3; IV Piggyback:320.1] Out: 2185 [Urine:1885; Emesis/NG output:300]     Vent Mode: PRVC FiO2 (%):  [60 %-70 %] 60 % Set Rate:  [18 bmp] 18 bmp Vt Set:  [600 mL] 600 mL PEEP:  [5 cmH20] 5 cmH20 Plateau Pressure:  [17 cmH20-18 cmH20] 17 cmH20  PHYSICAL EXAM:  General:  Elderly male on vent HEENT:  [place]/AT, pupils 55mm and sluggish, no JVD Neuro: sedated, intermittent agitation.  CV: RRR, frequent PVC. Some AF and vent bigeminy overnight.   PULM:  Non labored on MV, diffuse rhonchi. Thick tan secretions GI: soft, bs+, foley Extremities: warm/dry, no LE edema  Skin: no rashes  RESOLVED PROBLEM LIST   ASSESSMENT AND PLAN    Acute hypoxemic respiratory failure: concern he has aspirated. RLL infiltrate.  P:  Continue full MV support Wean FiO2 for sat goal > 94-99 Remains on 60%, not a candidate to wean today.  VAP bundle Unasyn Follow trach asp sent 2/8 Propofol/fentanyl for RASS goal 0 to -1.    R MCA infarct s/p tPA and IR with L ICA stent placement. In and out of AF P:  Per Neurology  SBP goal per Neurology > 110; <180 Serial neuro exams Continue soft collar per Neuro IR ASA/ Brillinta  Heparin  infusion TTE as below  Shock  - possibly due to sedation vs related to ICA stent with carotid sinus being affected vs ?developing sepsis vs cardiogenic component given EF 45% (unclear if this new, no prior for comparison) Hx HTN  P:  Tele monitoring  SBP goal per Neuro >110; < 180 RA pressures on TTE 15, thus volume status appears adequate, does not clinically appear volume up Following cultures  Trending renal function  Consider cardiology consult if he recovers re: echo findings.    Hypokalemia - better today but goal > 4, Mag > 2 P:  Give Kphos Trend BMP/ Mag    Leukocytosis - stable, PCT up. improved - UA not overwhelming  P:  Follow cultures (UC/ trach asp) unasyn Trend fever curve/ WBC   Best Practice / Goals of Care / Disposition.   DVT PROPHYLAXIS: SCDs only/ adding heparin SQ if ok with neuro  (24hr post tPA) SUP: PPI NUTRITION: NPO, starting TF today  MOBILITY: Bedrest GOALS OF CARE: Full code FAMILY DISCUSSIONS:  Left message for wife DISPOSITION: ICU   LABS  Glucose Recent Labs  Lab 03/24/19 0829 03/24/19 1146 03/24/19 1534 03/24/19 2009 03/25/19 0007 03/25/19 0348  GLUCAP 145* 139* 153* 171* 137* 181*    BMET Recent Labs  Lab 03/23/19 0303 03/23/19 0303 03/23/19 1640 03/24/19 0423 03/25/19 0507  NA 139   < > 141 140 141  K 3.2*   < > 3.8 3.9 3.7  CL 105  --   --  111 114*  CO2 20*  --   --  21* 21*  BUN 17  --   --  13 17  CREATININE 1.17  --   --  1.19 1.32*  GLUCOSE 154*  --   --  145* 190*   < > = values in this interval not displayed.    Liver Enzymes Recent Labs  Lab 2019-04-09 1303 03/24/19 0423 03/25/19 0507  AST 19  --   --   ALT 15  --   --   ALKPHOS 54  --   --   BILITOT 0.8  --   --   ALBUMIN 3.5 2.5* 2.2*    Electrolytes Recent Labs  Lab 03/23/19 0303 03/23/19 0303 03/24/19 0423 03/24/19 1827 03/25/19 0507  CALCIUM 9.0  --  8.4*  --  8.5*  MG 1.7   < > 2.0 1.7 1.9  PHOS  --   --  1.9* 1.5* 1.6*   < >  = values in this interval not displayed.    CBC Recent Labs  Lab 03/23/19 0303 03/23/19 1619 03/23/19 1640 03/24/19 0423 03/25/19 0507  WBC 14.3*  --   --  11.4* 9.1  HGB 13.6   < > 11.9* 12.8* 12.3*  HCT 41.1   < > 35.0* 39.5 38.6*  PLT 159  --   --  130*  PLATELET CLUMPS NOTED ON SMEAR, UNABLE TO ESTIMATE   < > = values in this interval not displayed.    ABG Recent Labs  Lab 03/23/19 1640  PHART 7.364  PCO2ART 33.0  PO2ART 72.0*    Coag's Recent Labs  Lab 04/03/2019 1303  APTT 28  INR 0.9    Critical care time 40 mins    Joneen Roach, AGACNP-BC [facility]  See Amion for personal pager PCCM on call pager [phone]  03/25/2019 8:04 AM

## 2019-03-25 NOTE — Progress Notes (Signed)
ANTICOAGULATION CONSULT NOTE - Follow Up Consult  Pharmacy Consult for Heparin Indication: atrial fibrillation  No Known Allergies  Patient Measurements: Height: 5\' 11"  (180.3 cm) Weight: 189 lb 2.5 oz (85.8 kg) IBW/kg (Calculated) : 75.3 Heparin Dosing Weight: 85.8 kg  Vital Signs: Temp: 99.8 F (37.7 C) (02/10 0800) Temp Source: Axillary (02/10 0800) BP: 143/73 (02/10 0915) Pulse Rate: 79 (02/10 0915)  Labs: Recent Labs    04/03/2019 1303 03/20/2019 1308 03/23/19 0303 03/23/19 1619 03/23/19 1640 03/23/19 1640 03/24/19 0423 03/24/19 0950 03/24/19 1827 03/24/19 2230 03/25/19 0507 03/25/19 0805  HGB 16.1   < > 13.6   < > 11.9*   < > 12.8*  --   --   --  12.3*  --   HCT 48.0   < > 41.1   < > 35.0*  --  39.5  --   --   --  38.6*  --   PLT 145*   < > 159  --   --   --  130*  --   --   --  PLATELET CLUMPS NOTED ON SMEAR, UNABLE TO ESTIMATE  --   APTT 28  --   --   --   --   --   --   --   --   --   --   --   LABPROT 12.3  --   --   --   --   --   --   --   --   --   --   --   INR 0.9  --   --   --   --   --   --   --   --   --   --   --   HEPARINUNFRC  --   --   --   --   --   --   --   --  0.20* 0.25*  --  0.22*  CREATININE 1.36*   < > 1.17  --   --   --  1.19  --   --   --  1.32*  --   TROPONINIHS  --   --   --   --   --   --   --  27*  --   --   --   --    < > = values in this interval not displayed.    Estimated Creatinine Clearance: 42.8 mL/min (A) (by C-G formula based on SCr of 1.32 mg/dL (H)).   Medical History: Past Medical History:  Diagnosis Date  . Anemia   . Chronic back pain    herniated disc  . Hepatitis    50+yrs ago  . Hypertension    takes Amlodipine/Valsartan/HCTZ daily  . Personal history of kidney stones     Assessment: 86 yr old male presented 2/7 with slurred speech and dysphagia and was found to have acute right middle cerebral artery stroke, S/P tPA and EVR, who returned to ICU on mechanical ventilation overnight.   Pharmacy is  consulted to start heparin for atrial fibrillation - no bolus. Pt was on no anticoagulation PTA.  Heparin level subtherapeutic at 1300 units/hr was 0.22 units/ml, which is below the desired goal range for this pt. Hgb 12.3. Per RN, no issues with IV or bleeding observed.  Goal of Therapy:  Heparin level 0.3 - 0.5 units/ml Monitor platelets by anticoagulation protocol: Yes   Plan:  Incr heparin infusion to 1450 units/hr  Check heparin level 1600  Monitor daily heparin level, CBC Monitor for signs/symptoms of bleeding  Alanda Slim, PharmD, [facility] Clinical Pharmacist Please see AMION for all Pharmacists' Contact Phone Numbers 03/25/2019, 9:40 AM

## 2019-03-25 NOTE — Progress Notes (Signed)
OT Cancellation Note  Patient Details Name: AMARO MANGOLD MRN: [id number] DOB: [birth year]   Cancelled Treatment:    Reason Eval/Treat Not Completed: Patient not medically ready(Pt remains intubated and soft BP.)  RN requesting no therapy today as pt is not appropriate.  Flora Lipps, OTR/L Acute Rehabilitation Services Pager: [phone] Office: [phone]   Lonzo Cloud 03/25/2019, 1:46 PM

## 2019-03-25 NOTE — Progress Notes (Signed)
ANTICOAGULATION CONSULT NOTE  Pharmacy Consult for Heparin Indication: atrial fibrillation  No Known Allergies  Patient Measurements: Height: 5\' 11"  (180.3 cm) Weight: 189 lb 2.5 oz (85.8 kg) IBW/kg (Calculated) : 75.3 Heparin Dosing Weight: 85.8 kg  Vital Signs: Temp: 98 F (36.7 C) (02/10 1605) Temp Source: Axillary (02/10 1605) BP: 128/57 (02/10 1700) Pulse Rate: 49 (02/10 1700)  Labs: Recent Labs    03/23/19 0303 03/23/19 1619 03/23/19 1640 03/23/19 1640 03/24/19 0423 03/24/19 0950 03/24/19 1827 03/24/19 2230 03/25/19 0507 03/25/19 0805 03/25/19 1619  HGB 13.6   < > 11.9*   < > 12.8*  --   --   --  12.3*  --   --   HCT 41.1   < > 35.0*  --  39.5  --   --   --  38.6*  --   --   PLT 159  --   --   --  130*  --   --   --  PLATELET CLUMPS NOTED ON SMEAR, UNABLE TO ESTIMATE  --   --   HEPARINUNFRC  --   --   --   --   --   --    < > 0.25*  --  0.22* 0.22*  CREATININE 1.17  --   --   --  1.19  --   --   --  1.32*  --   --   TROPONINIHS  --   --   --   --   --  27*  --   --   --   --   --    < > = values in this interval not displayed.    Estimated Creatinine Clearance: 42.8 mL/min (A) (by C-G formula based on SCr of 1.32 mg/dL (H)).   Assessment: 86 yr old male presented 2/7 with slurred speech and dysphagia and was found to have acute right middle cerebral artery stroke, s/p tPA and EVR, who returned to ICU on mechanical ventilation overnight.   Pharmacy is consulted to dose IV heparin for atrial fibrillation - no bolus.  Heparin level remains sub-therapeutic despite several rate increases.  No issue with heparin infusion nor lab draw per discussion with RN.  No bleeding reported.  Goal of Therapy:  Heparin level 0.3 - 0.5 units/ml Monitor platelets by anticoagulation protocol: Yes   Plan:  Increase heparin gtt to 1600 units/hr Check 8 hr heparin level Daily heparin level and CBC  Rosha Cocker D. 4/7, PharmD, BCPS, BCCCP 03/25/2019, 5:28 PM

## 2019-03-25 NOTE — Progress Notes (Signed)
Rehab Admissions Coordinator Note:  Per PT and OT recommendation from 2/9, this patient was screened by Cheri Rous for appropriateness for an Inpatient Acute Rehab Consult. Noted pt remains on the vent and is not yet medically ready for consult. Will follow along and will request consult order once appropriate.    Cheri Rous 03/25/2019, 3:00 PM  I can be reached at [phone].

## 2019-03-25 NOTE — Progress Notes (Signed)
Referring Physician(s): Code Stroke- Kerney Elbe  Supervising Physician: Luanne Bras  Patient Status:  [facility] - In-pt  Chief Complaint: None- intubated with sedation  Subjective:  Acute CVA s/p cerebral arteriogram s/p emergent stent placement of proximal right ICA severe pre-occlusive stenosis along with balloon angioplasty of proximal/mid right ICA achieving a TICI 3 revascularization 03/25/2019 by Dr. Estanislado Pandy. Patient laying in bed intubated with sedation. He opens eyes to name. Can spontaneously move all extremities. Right groin incision c/d/i.   Allergies: Patient has no known allergies.  Medications: Prior to Admission medications   Medication Sig Start Date End Date Taking? Authorizing Provider  amLODipine (NORVASC) 5 MG tablet Take 5 mg by mouth daily. 01/29/19  Yes [provider]  donepezil (ARICEPT) 5 MG tablet Take 5 mg by mouth at bedtime. 03/05/19  Yes [provider]  Ensure (ENSURE) Take 237 mLs by mouth 3 (three) times daily between meals.   Yes [provider]  memantine (NAMENDA) 5 MG tablet Take 5 mg by mouth 2 (two) times daily. 02/04/19  Yes [provider]  triamcinolone cream (KENALOG) 0.1 % Apply 1 application topically at bedtime. Apply to rash on back of right leg below knee   Yes [provider]  valsartan-hydrochlorothiazide (DIOVAN-HCT) 160-12.5 MG tablet Take 1 tablet by mouth daily. 01/29/19  Yes [provider]  amLODipine-valsartan (EXFORGE) 5-320 MG per tablet Take 1 tablet by mouth daily.    04/27/11  [provider]     Vital Signs: BP 127/67 (BP Location: Left Arm)    Pulse (!) 103    Temp 99.8 F (37.7 C) (Axillary)    Resp 14    Ht '5\' 11"'  (1.803 m)    Wt 189 lb 2.5 oz (85.8 kg)    SpO2 96%    BMI 26.38 kg/m   Physical Exam Vitals and nursing note reviewed.  Constitutional:      General: He is not in acute distress.    Comments: Intubated with sedation.    Pulmonary:     Effort: Pulmonary effort is normal. No respiratory distress.     Comments: Intubated with sedation. Skin:    General: Skin is warm and dry.     Comments: Right groin incision soft without active bleeding or hematoma.  Neurological:     Comments: Intubated with sedation. He opens eyes to voice. PERRL bilaterally. Can spontaneously move all extremities. Distal pulses 1+ bilaterally.   Psychiatric:     Comments: Intubated with sedation.     Imaging: CT Code Stroke CTA Head W/WO contrast  Result Date: 03/31/2019 CLINICAL DATA:  86 year old male code stroke presentation with hyperdense right MCA on plain head CT. EXAM: CT ANGIOGRAPHY HEAD AND NECK TECHNIQUE: Multidetector CT imaging of the head and neck was performed using the standard protocol during bolus administration of intravenous contrast. Multiplanar CT image reconstructions and MIPs were obtained to evaluate the vascular anatomy. Carotid stenosis measurements (when applicable) are obtained utilizing NASCET criteria, using the distal internal carotid diameter as the denominator. CONTRAST:  65m OMNIPAQUE IOHEXOL 350 MG/ML SOLN COMPARISON:  Plain head CT 1306 hours today. FINDINGS: CTA NECK Skeleton: Absent maxillary dentition. Advanced degenerative changes in the cervical spine. No acute osseous abnormality identified. Upper chest: Mild upper lung atelectasis. No superior mediastinal lymphadenopathy. Other neck: Negative, no acute findings in the neck. Aortic arch: 3 vessel arch configuration. Mild to moderate arch atherosclerosis, with mostly soft plaque in the distal arch. Right carotid system: No brachiocephalic or  right CCA origin stenosis despite some plaque. No stenosis proximal to the bifurcation, but there is bulky soft plaque or thrombus throughout the right ICA origin and bulb resulting in critical radiographic string sign stenosis on series 9, image 51. Superimposed additional bulky calcified plaque distal to the  bulb also resulting in tandem string sign stenoses (series 5, image 105 and series 9 image 50). Despite this the vessel remains patent although is asymmetrically smaller to the skull base (about 3 mm diameter). Left carotid system: No left CCA origin stenosis despite plaque. At the left carotid bifurcation the left ICA origin is patent, but at the distal bulb there is bulky calcified more so than soft plaque resulting in severe stenosis at or approaching a radiographic string sign on series 5, image 103. The vessel remains patent and has a fairly normal caliber distal to this region to the skull base. Vertebral arteries: Calcified plaque in the proximal right subclavian artery with less than 50 % stenosis with respect to the distal vessel. Minor calcified plaque at the right vertebral artery origin without stenosis. However, there is bulky soft plaque in the right V1 segment resulting in moderate to severe stenosis on series 7, image 272. Distal to this the right vertebral caliber remains normal, and the vessel is then patent to the skull base without additional stenosis. Soft and calcified plaque plus tortuosity of the proximal left subclavian artery resulting in a kinked appearance and moderate stenosis as seen on series 9, image 109. Superimposed severe stenosis at the left vertebral artery origin due to soft plaque on series 8, image 133. The left vertebral is fairly codominant and then patent to the skull base without additional stenosis. CTA HEAD Posterior circulation: Non dominant left vertebral artery with mild calcified plaque does remain patent to the vertebrobasilar junction without stenosis. Moderate right V4 segment calcification with mild stenosis. Patent vertebrobasilar junction and basilar artery without stenosis. SCA and PCA origins are patent. Posterior communicating arteries are diminutive or absent. There is mild bilateral PCA irregularity and stenosis. Anterior circulation: Both ICA siphons are  patent. On the left there is mild to moderate calcified plaque without stenosis. Normal left ophthalmic artery origin. On the right there is mild calcified plaque without stenosis. Normal right ophthalmic artery origin. Patent carotid termini. Patent bilateral MCA and left ACA origins. The left A1 is dominant and the right diminutive or absent. Anterior communicating artery and bilateral ACA branches are within normal limits. Left MCA M1 segment and bifurcation are patent without stenosis. Right MCA proximal M1 segment is patent but becomes near occluded more distally corresponding to the hyperdense segment by plain CT. The mid and distal right M1 segment is affected (series 11, image 20 and series 10, image 19) with intermediate to poor reconstitution of right M2 and M3 branches (series 12, image 9). Venous sinuses: Early contrast timing, grossly patent. Anatomic variants: Mildly dominant right vertebral artery. Review of the MIP images confirms the above findings IMPRESSION: 1. Positive for Right MCA Emergent Large Vessel Occlusion affecting the mid and distal M1 segment. 2. Positive also for superimposed Critical Stenosis cervical Right ICA beginning at the origin, throughout the proximal 3.5 cm of the vessel. There are tandem RADIOGRAPHIC STRING SIGN STENOSES. 3. The above was discussed by telephone with to Dr. [name] at 1338 hours on 04/12/2019. 4. Additional severe atherosclerosis and multifocal high-grade arterial stenoses elsewhere: - Left ICA distal bulb RADIOGRAPHIC STRING SIGN STENOSIS. - Severe bilateral Vertebral Artery origin stenoses. - proximal Left  Subclavian Artery moderate stenosis due to plaque and tortuosity. 5.  Aortic Atherosclerosis (ICD10-I70.0). Electronically Signed   By: Genevie Ann M.D.   On: 04/10/2019 14:16   CT Code Stroke CTA Neck W/WO contrast  Result Date: 03/17/2019 CLINICAL DATA:  86 year old male code stroke presentation with hyperdense right MCA on plain head CT. EXAM: CT  ANGIOGRAPHY HEAD AND NECK TECHNIQUE: Multidetector CT imaging of the head and neck was performed using the standard protocol during bolus administration of intravenous contrast. Multiplanar CT image reconstructions and MIPs were obtained to evaluate the vascular anatomy. Carotid stenosis measurements (when applicable) are obtained utilizing NASCET criteria, using the distal internal carotid diameter as the denominator. CONTRAST:  31m OMNIPAQUE IOHEXOL 350 MG/ML SOLN COMPARISON:  Plain head CT 1306 hours today. FINDINGS: CTA NECK Skeleton: Absent maxillary dentition. Advanced degenerative changes in the cervical spine. No acute osseous abnormality identified. Upper chest: Mild upper lung atelectasis. No superior mediastinal lymphadenopathy. Other neck: Negative, no acute findings in the neck. Aortic arch: 3 vessel arch configuration. Mild to moderate arch atherosclerosis, with mostly soft plaque in the distal arch. Right carotid system: No brachiocephalic or right CCA origin stenosis despite some plaque. No stenosis proximal to the bifurcation, but there is bulky soft plaque or thrombus throughout the right ICA origin and bulb resulting in critical radiographic string sign stenosis on series 9, image 51. Superimposed additional bulky calcified plaque distal to the bulb also resulting in tandem string sign stenoses (series 5, image 105 and series 9 image 50). Despite this the vessel remains patent although is asymmetrically smaller to the skull base (about 3 mm diameter). Left carotid system: No left CCA origin stenosis despite plaque. At the left carotid bifurcation the left ICA origin is patent, but at the distal bulb there is bulky calcified more so than soft plaque resulting in severe stenosis at or approaching a radiographic string sign on series 5, image 103. The vessel remains patent and has a fairly normal caliber distal to this region to the skull base. Vertebral arteries: Calcified plaque in the proximal  right subclavian artery with less than 50 % stenosis with respect to the distal vessel. Minor calcified plaque at the right vertebral artery origin without stenosis. However, there is bulky soft plaque in the right V1 segment resulting in moderate to severe stenosis on series 7, image 272. Distal to this the right vertebral caliber remains normal, and the vessel is then patent to the skull base without additional stenosis. Soft and calcified plaque plus tortuosity of the proximal left subclavian artery resulting in a kinked appearance and moderate stenosis as seen on series 9, image 109. Superimposed severe stenosis at the left vertebral artery origin due to soft plaque on series 8, image 133. The left vertebral is fairly codominant and then patent to the skull base without additional stenosis. CTA HEAD Posterior circulation: Non dominant left vertebral artery with mild calcified plaque does remain patent to the vertebrobasilar junction without stenosis. Moderate right V4 segment calcification with mild stenosis. Patent vertebrobasilar junction and basilar artery without stenosis. SCA and PCA origins are patent. Posterior communicating arteries are diminutive or absent. There is mild bilateral PCA irregularity and stenosis. Anterior circulation: Both ICA siphons are patent. On the left there is mild to moderate calcified plaque without stenosis. Normal left ophthalmic artery origin. On the right there is mild calcified plaque without stenosis. Normal right ophthalmic artery origin. Patent carotid termini. Patent bilateral MCA and left ACA origins. The left A1 is dominant  and the right diminutive or absent. Anterior communicating artery and bilateral ACA branches are within normal limits. Left MCA M1 segment and bifurcation are patent without stenosis. Right MCA proximal M1 segment is patent but becomes near occluded more distally corresponding to the hyperdense segment by plain CT. The mid and distal right M1  segment is affected (series 11, image 20 and series 10, image 19) with intermediate to poor reconstitution of right M2 and M3 branches (series 12, image 9). Venous sinuses: Early contrast timing, grossly patent. Anatomic variants: Mildly dominant right vertebral artery. Review of the MIP images confirms the above findings IMPRESSION: 1. Positive for Right MCA Emergent Large Vessel Occlusion affecting the mid and distal M1 segment. 2. Positive also for superimposed Critical Stenosis cervical Right ICA beginning at the origin, throughout the proximal 3.5 cm of the vessel. There are tandem RADIOGRAPHIC STRING SIGN STENOSES. 3. The above was discussed by telephone with to Dr. [name] at 1338 hours on 03/18/2019. 4. Additional severe atherosclerosis and multifocal high-grade arterial stenoses elsewhere: - Left ICA distal bulb RADIOGRAPHIC STRING SIGN STENOSIS. - Severe bilateral Vertebral Artery origin stenoses. - proximal Left Subclavian Artery moderate stenosis due to plaque and tortuosity. 5.  Aortic Atherosclerosis (ICD10-I70.0). Electronically Signed   By: Genevie Ann M.D.   On: 03/27/2019 14:16   MR BRAIN WO CONTRAST  Result Date: 03/23/2019 CLINICAL DATA:  Stroke, follow-up. EXAM: MRI HEAD WITHOUT CONTRAST TECHNIQUE: Multiplanar, multiecho pulse sequences of the brain and surrounding structures were obtained without intravenous contrast. COMPARISON:  Noncontrast CT head, CT angiogram head/neck and CT perfusion 03/23/2019 FINDINGS: Brain: There is an acute cortical/subcortical infarct within the anterolateral right temporal lobe measuring 3.1 x 1.5 cm in transaxial dimensions (series 5, image 67). There are numerous additional small scattered cortical and subcortical infarcts within the right cerebral hemisphere MCA vascular territory involving the right frontal lobe, parietal lobe, occipital lobe and temporal lobe as well as right insula. There is no significant mass effect. No midline shift or extra-axial fluid  collection. No chronic intracranial blood products. Moderate patchy T2/FLAIR hyperintensity within the cerebral white matter is nonspecific, but consistent with chronic small vessel ischemic disease. Moderate generalized parenchymal atrophy. Vascular: SWI signal loss in the region of proximal right M2 MCA branches (series 12, image 23) which may reflect residual thrombus. Skull and upper cervical spine: No focal marrow lesion. Incompletely assessed upper cervical spondylosis. Sinuses/Orbits: Visualized orbits demonstrate no acute abnormality. Paranasal sinus mucosal thickening. Most notably there is moderate mucosal thickening within bilateral ethmoid air cells. Left sphenoid sinus air-fluid level. Small bilateral mastoid effusions. IMPRESSION: 3.1 cm acute cortical/subcortical infarct within the anterolateral right temporal lobe. Numerous additional small scattered acute cortical and subcortical infarct within the right cerebral hemisphere MCA vascular territory. No significant mass effect or evidence of parenchymal hemorrhage. SWI signal loss in the region of proximal right M2 MCA branches which may reflect residual thrombus. Moderate generalized parenchymal atrophy and chronic small vessel ischemic disease. Paranasal sinus disease as described. Small bilateral mastoid effusions. Electronically Signed   By: Kellie Simmering DO   On: 03/23/2019 13:43   IR INTRAVSC STENT CERV CAROTID W/O EMB-PROT MOD SED  Result Date: 04/11/2019 INDICATION: New onset of slurred speech and left-sided weakness. Left-sided neglect. CT angiogram of the head and neck revealing a pre occlusive severe stenosis of the right internal carotid proximally with angiographic string sign.  Large non occlusive filling defect in the right middle cerebral artery distally extending into the inferior division proximally.  EXAM: 1. EMERGENT LARGE VESSEL OCCLUSION THROMBOLYSIS (anterior CIRCULATION)  COMPARISON:  CT angiogram of the head and neck of  March 22, 2019.  MEDICATIONS: Ancef 2 g IV antibiotic was administered within 1 hour of the procedure.  ANESTHESIA/SEDATION: General anesthesia.  CONTRAST:  Omnipaque 300 approximately 120 mL.  FLUOROSCOPY TIME:  Fluoroscopy Time: 76 minutes 48 seconds (1992 mGy).  COMPLICATIONS: None immediate.  TECHNIQUE: Following a full explanation of the procedure along with the potential associated complications, an informed witnessed consent was obtained from the wife. The risks of intracranial hemorrhage of 10%, worsening neurological deficit, ventilator dependency, death and inability to revascularize were all reviewed in detail with the patient's spouse.  The patient was then put under general anesthesia by the Department of Anesthesiology at [facility].  The right groin was prepped and draped in the usual sterile fashion. Thereafter using modified Seldinger technique, transfemoral access into the right common femoral artery was obtained without difficulty. Over a 0.035 inch guidewire a 5 French Pinnacle sheath was inserted. Through this, and also over a 0.035 inch guidewire a 5 [place] JB 1 catheter was advanced to the aortic arch region and selectively positioned in the innominate artery and the right common carotid artery.  FINDINGS: The innominate artery angiogram demonstrates the origin of the right subclavian artery and the right common carotid artery to be widely patent.  There is mild narrowing of the origin of the right vertebral artery.  The vessel is, otherwise, seen to opacify to the cranial skull base to the level of the right posterior-inferior cerebellar artery.  The right common carotid arteriogram demonstrates the origin the right external carotid artery and its major branches to be widely patent.  The right internal carotid artery at the bulb and just distally demonstrates severe pre occlusive stenosis with an angiographic string sign.  No focal area of severe stenosis also noted  in the right internal carotid artery proximally. More distally there is decreased caliber of the right internal carotid artery to the cranial skull base. The left common carotid arteriogram demonstrates the left external carotid artery and its major branches to be widely patent.  The right internal carotid artery at the bulb demonstrates a high-grade stenosis of approximately 75% in the lateral projection associated with a segmental atherosclerotic plaque.  More distally the vessel is seen to opacify to the cranial skull base. The petrous, cavernous and the supraclinoid left ICA demonstrates wide patency.  The left anterior and the left middle cerebral artery opacify into the capillary and venous phases. There is mild to moderate narrowing of the left anterior cerebral A1 segment.  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  Delayed arterial and capillary phases demonstrate opacification of the right anterior cerebral artery territory distal to the A2 segment.  PROCEDURE: The diagnostic JB 1 catheter in the right common carotid artery was then exchanged over a 0.035 inch 300 cm Rosen exchange guidewire for an 8 [place] Pinnacle sheath in the right groin which was then connected to continuous heparinized saline infusion. The patient was loaded with aspirin 81 mg, and Brilinta 180 mg via an orogastric tube in anticipation for revascularization of the right internal carotid artery proximally.  Over the exchange guidewire, an 087 balloon guide catheter which had been prepped with 50% contrast and 50%  heparinized saline infusion was advanced and positioned just proximal to the right common carotid bifurcation. The guidewire was removed. Good aspiration was obtained from the hub of the balloon guide  catheter. A gentle control arteriogram performed through this continued to demonstrate no change in the severely pre occlusive narrowing of the right internal carotid artery at the bulb, and also in the proximal right internal carotid artery.  Over a 0.014 inch standard Synchro micro guidewire, an 021 Trevo ProVue microcatheter was advanced to the distal end of the balloon guide catheter. The micro guidewire was then gently manipulated using a torque device and advanced without difficulty to the cervical petrous junction. However, advancement of the 021 Trevo ProVue micro catheter was met with significant resistance at the upper tandem calcified lesion.  This was then replaced instead with a 017 Headway 2 tip microcatheter which was again advanced over a 0.014 inch standard Synchro micro guidewire without difficulty to the cervical petrous junction.  The micro guidewire was removed. Good aspiration obtained from the hub of the microcatheter. This in turn was then exchanged for an 014 inch softip Transend 300 cm exchange micro guidewire with a J configuration.  Control arteriogram performed through the balloon guide catheter demonstrated improved flow through the right internal carotid artery proximally and distally. A 4 mm x 30 mm Viatrac 14 balloon angioplasty catheter was then prepped and purged with heparinized saline infusion.  Using the rapid exchange technique this was then advanced and positioned without difficulty across the proximal right ICA stenosis.  Control inflation was then carried out using micro inflation syringe device via micro tubing to the normal pressure where it was maintained for approximately 20 seconds. At this time, there was significant bradycardia noted. This prompted immediate deflation and retrieval of the balloon whilst the exchange micro guidewire was kept and positioned in the petrous segment of the right internal carotid artery.  A control arteriogram performed through the  balloon guide catheter in the right common carotid artery now demonstrated significantly improved caliber and flow through the proximally angioplastied segment of the right internal carotid artery now revealing the significant stenosis distal to this involving the calcified plaque.  More distally, free flow was seen in the right internal carotid artery to the cranial skull base in the petrous cavernous and supraclinoid segments. The right middle cerebral artery distribution was noted to be widely patent with no evidence of filling defect in the right middle cerebral artery over its distal territories.  A TICI 3 revascularization had been achieved.  There was no visualization of the right anterior cerebral artery A1 segment. This was also noted to be on the CT angiogram of the head and neck performed earlier.  Measurements were then performed of the right internal carotid artery in the most normal appearance angiographically distal to the proximal angioplasty segment, and also the right common carotid artery. It was decided to proceed with placement of a 6/8 mm x 40 mm Xact stent.  The delivery catheter of the stent was then prepped and purged with heparinized saline infusion retrogradely.  Again using the rapid exchange technique, this was then advanced without difficulty to the right common carotid bifurcation.  The distal portion of the stent was positioned such that it was proximal to the more tandem heavily calcified lesion.  This stent was then deployed without difficulty. The delivery system was then retrieved and removed.  A control arteriogram performed through the balloon guide catheter in the right common carotid artery now demonstrates  significantly improved caliber and flow through the stented segment and also through the internal carotid artery proximally and distally.  There continued to be the calcified significant stenosis involving the proximal right ICA as mentioned earlier.   Measurements were performed of the right internal carotid artery in the C1-C2 junction, and also aspirate was the noted in the right internal carotid artery at the proximal aspect of the initial stent.  Over the exchange micro guidewire, a 4 mm x 22 mm Resolute Onyx drug-eluting balloon mounted stent was prepped and purged with heparinized saline infusion and also with 50% contrast and 50% heparinized saline infusion.  This was then advanced again using the rapid exchange technique without difficulty into the distal portion of the initial stent. However, this stent could not be advanced into the C1-C2 junction on account of the severe calcific plaque.  This is most likely related to calcified ledge protruding intraluminally.  After multiple attempts, the balloon mounted stent delivery system was retrieved and removed.  It was, therefore, decided to proceed with balloon angioplasty. A TREK 3 mm x 12 mm coronary angioplasty balloon catheter was prepped and purged with heparinized saline infusion. Using the rapid exchange technique, this was advanced without difficulty and positioned in the distal and proximal markers at the site of the severe high-grade stenosis. A control angioplasty was then performed again with a micro inflation syringe device via micro tubing. This was carried to 6 atmospheres where it was maintained for approximately 90 seconds. Upon deflation and proximal retrieval, a control arteriogram performed through the balloon guide catheter in the right common carotid artery now demonstrated significantly improved caliber and flow through the angioplastied segment also distally.  It was elected to try advancement of the previously Resolute Onyx 4 x 22 mm balloon mounted stent in view of the improved caliber following the angioplasty.  At this time the stent delivery catheter was advanced more distally. However, further advancement was again met middle with significant resistance secondary to  calcified plaque.  Again after multiple trials, this was abandoned.  Control arteriogram performed through the balloon guide catheter in the right common carotid artery continued to demonstrate significantly improved caliber and flow through the angioplastied segment of the right internal carotid artery.  There was approximately 80% patency noted at the site of the angioplasty.  10 minute control arteriogram performed through the balloon guide catheter continued to demonstrate significantly improved caliber and flow through the angioplastied segment.  A control arteriogram performed intracranially continued to demonstrate a completely revascularized right middle cerebral artery distribution. An anterior cerebral artery A1 segment again was visualized.  The balloon guide was then retrieved and removed. The 8 French sheath was left in place and connected to continuous heparinized saline infusion. No evidence of hematoma or bleeding was seen. The distal pulses remained Dopplerable in the dorsalis pedis, and the posterior tibial regions bilaterally unchanged.  The patient was also left intubated on account of the IV tPA the patient had had, in the large-bore Pinnacle sheath.  The patient was also given approximately 7.5 mg of Integrilin during the course of the treatment involving the right ICA angioplasty tandem lesion. Additionally it was decided to infuse Aggrastat over approximately 12 hours in order to obviate the potential for acute platelet aggregation at the site of the angioplasty, and also the stent.  A flat panel CT of the brain demonstrated no evidence of gross midline shift or of hemorrhage.  The patient was then transferred to the neuro ICU  to continue with post revascularization management.  IMPRESSION: Complete revascularization of previously noted large filling defect in the right middle cerebral artery distally extending into the proximal inferior division following proximal  revascularization, and use of approximately 7.5 mg with a TICI 3 revascularization of the right middle cerebral artery distribution.  Status post endovascular revascularization of symptomatic near complete occlusion of the right internal carotid artery proximally with a string sign with placement of stent following balloon angioplasty as described.  Status post endovascular balloon angioplasty of significant stenosis secondary to a calcified atherosclerotic plaque involving the proximal and middle 1/3 of the right internal carotid artery with patency of 80%.  Non visualization of the right anterior cerebral artery A1 segment. Opacification of the right anterior cerebral artery at the level of the A2 segment from a communication with the left anterior cerebral artery A2 segment. 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  PLAN: Follow-up in the clinic 4 weeks post discharge.   Electronically Signed   By: Luanne Bras M.D.   On: 03/23/2019 15:13  IR CT Head Ltd  Result Date: 03/24/2019 INDICATION: New onset of slurred speech and left-sided weakness. Left-sided neglect. CT angiogram of the head and neck revealing a pre occlusive severe stenosis of the right internal carotid proximally with angiographic string sign. Large non occlusive filling defect in the right middle cerebral artery distally extending into the inferior division proximally. EXAM: 1. EMERGENT LARGE VESSEL OCCLUSION THROMBOLYSIS (anterior CIRCULATION) COMPARISON:  CT angiogram of the head and neck of March 22, 2019. MEDICATIONS: Ancef 2 g IV antibiotic was administered within 1 hour of the procedure. ANESTHESIA/SEDATION: General anesthesia. CONTRAST:  Omnipaque 300 approximately 120 mL. FLUOROSCOPY TIME:  Fluoroscopy Time: 76 minutes 48 seconds (1992 mGy). COMPLICATIONS: None immediate. TECHNIQUE: Following a full explanation of the procedure along with the potential associated  complications, an informed witnessed consent was obtained from the wife. The risks of intracranial hemorrhage of 10%, worsening neurological deficit, ventilator dependency, death and inability to revascularize were all reviewed in detail with the patient's spouse. The patient was then put under general anesthesia by the Department of Anesthesiology at [facility]. The right groin was prepped and draped in the usual sterile fashion. Thereafter using modified Seldinger technique, transfemoral access into the right common femoral artery was obtained without difficulty. Over a 0.035 inch guidewire a 5 French Pinnacle sheath was inserted. Through this, and also over a 0.035 inch guidewire a 5 [place] JB 1 catheter was advanced to the aortic arch region and selectively positioned in the innominate artery and the right common carotid artery. FINDINGS: The innominate artery angiogram demonstrates the origin of the right subclavian artery and the right common carotid artery to be widely patent. There is mild narrowing of the origin of the right vertebral artery. The vessel is, otherwise, seen to opacify to the cranial skull base to the level of the right posterior-inferior cerebellar artery. The right common carotid arteriogram demonstrates the origin the right external carotid artery and its major branches to be widely patent. The right internal carotid artery at the bulb and just distally demonstrates severe pre occlusive stenosis with an angiographic string sign. No focal area of severe stenosis also noted in the right internal carotid artery proximally. More distally there is decreased caliber of the right internal carotid artery to the cranial skull base. The left common carotid arteriogram demonstrates the left external carotid artery and its major branches  to be widely patent. The right internal carotid artery at the bulb demonstrates a high-grade stenosis of approximately 75% in the lateral projection  associated with a segmental atherosclerotic plaque. More distally the vessel is seen to opacify to the cranial skull base. The petrous, cavernous and the supraclinoid left ICA demonstrates wide patency. The left anterior and the left middle cerebral artery opacify into the capillary and venous phases. There is mild to moderate narrowing of the left anterior cerebral A1 segment. 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 Delayed arterial and capillary phases demonstrate opacification of the right anterior cerebral artery territory distal to the A2 segment. PROCEDURE: The diagnostic JB 1 catheter in the right common carotid artery was then exchanged over a 0.035 inch 300 cm Rosen exchange guidewire for an 8 [place] Pinnacle sheath in the right groin which was then connected to continuous heparinized saline infusion. The patient was loaded with aspirin 81 mg, and Brilinta 180 mg via an orogastric tube in anticipation for revascularization of the right internal carotid artery proximally. Over the exchange guidewire, an 087 balloon guide catheter which had been prepped with 50% contrast and 50% heparinized saline infusion was advanced and positioned just proximal to the right common carotid bifurcation. The guidewire was removed. Good aspiration was obtained from the hub of the balloon guide catheter. A gentle control arteriogram performed through this continued to demonstrate no change in the severely pre occlusive narrowing of the right internal carotid artery at the bulb, and also in the proximal right internal carotid artery. Over a 0.014 inch standard Synchro micro guidewire, an 021 Trevo ProVue microcatheter was advanced to the distal end of the balloon guide catheter. The micro guidewire was then  gently manipulated using a torque device and advanced without difficulty to the cervical petrous junction. However, advancement of the 021 Trevo ProVue micro catheter was met with significant resistance at the upper tandem calcified lesion. This was then replaced instead with a 017 Headway 2 tip microcatheter which was again advanced over a 0.014 inch standard Synchro micro guidewire without difficulty to the cervical petrous junction. The micro guidewire was removed. Good aspiration obtained from the hub of the microcatheter. This in turn was then exchanged for an 014 inch softip Transend 300 cm exchange micro guidewire with a J configuration. Control arteriogram performed through the balloon guide catheter demonstrated improved flow through the right internal carotid artery proximally and distally. A 4 mm x 30 mm Viatrac 14 balloon angioplasty catheter was then prepped and purged with heparinized saline infusion. Using the rapid exchange technique this was then advanced and positioned without difficulty across the proximal right ICA stenosis. Control inflation was then carried out using micro inflation syringe device via micro tubing to the normal pressure where it was maintained for approximately 20 seconds. At this time, there was significant bradycardia noted. This prompted immediate deflation and retrieval of the balloon whilst the exchange micro guidewire was kept and positioned in the petrous segment of the right internal carotid artery. A control arteriogram performed through the balloon guide catheter in the right common carotid artery now demonstrated significantly improved caliber and flow through the proximally angioplastied segment of the right internal carotid artery now revealing the significant stenosis distal to this involving the calcified plaque. More distally, free flow was seen  in the right internal carotid artery to the cranial skull base in the petrous cavernous and supraclinoid segments. The  right middle cerebral artery distribution was noted to be widely patent with no evidence of filling defect in the right middle cerebral artery over its distal territories. A TICI 3 revascularization had been achieved. There was no visualization of the right anterior cerebral artery A1 segment. This was also noted to be on the CT angiogram of the head and neck performed earlier. Measurements were then performed of the right internal carotid artery in the most normal appearance angiographically distal to the proximal angioplasty segment, and also the right common carotid artery. It was decided to proceed with placement of a 6/8 mm x 40 mm Xact stent. The delivery catheter of the stent was then prepped and purged with heparinized saline infusion retrogradely. Again using the rapid exchange technique, this was then advanced without difficulty to the right common carotid bifurcation. The distal portion of the stent was positioned such that it was proximal to the more tandem heavily calcified lesion. This stent was then deployed without difficulty. The delivery system was then retrieved and removed. A control arteriogram performed through the balloon guide catheter in the right common carotid artery now demonstrates significantly improved caliber and flow through the stented segment and also through the internal carotid artery proximally and distally. There continued to be the calcified significant stenosis involving the proximal right ICA as mentioned earlier. Measurements were performed of the right internal carotid artery in the C1-C2 junction, and also aspirate was the noted in the right internal carotid artery at the proximal aspect of the initial stent. Over the exchange micro guidewire, a 4 mm x 22 mm Resolute Onyx drug-eluting balloon mounted stent was prepped and purged with heparinized saline infusion and also with 50% contrast and 50% heparinized saline infusion. This was then advanced again using the rapid  exchange technique without difficulty into the distal portion of the initial stent. However, this stent could not be advanced into the C1-C2 junction on account of the severe calcific plaque. This is most likely related to calcified ledge protruding intraluminally. After multiple attempts, the balloon mounted stent delivery system was retrieved and removed. It was, therefore, decided to proceed with balloon angioplasty. A TREK 3 mm x 12 mm coronary angioplasty balloon catheter was prepped and purged with heparinized saline infusion. Using the rapid exchange technique, this was advanced without difficulty and positioned in the distal and proximal markers at the site of the severe high-grade stenosis. A control angioplasty was then performed again with a micro inflation syringe device via micro tubing. This was carried to 6 atmospheres where it was maintained for approximately 90 seconds. Upon deflation and proximal retrieval, a control arteriogram performed through the balloon guide catheter in the right common carotid artery now demonstrated significantly improved caliber and flow through the angioplastied segment also distally. It was elected to try advancement of the previously Resolute Onyx 4 x 22 mm balloon mounted stent in view of the improved caliber following the angioplasty. At this time the stent delivery catheter was advanced more distally. However, further advancement was again met middle with significant resistance secondary to calcified plaque. Again after multiple trials, this was abandoned. Control arteriogram performed through the balloon guide catheter in the right common carotid artery continued to demonstrate significantly improved caliber and flow through the angioplastied segment of the right internal carotid artery. There was approximately 80% patency noted at the site of the angioplasty. 10  minute control arteriogram performed through the balloon guide catheter continued to demonstrate  significantly improved caliber and flow through the angioplastied segment. A control arteriogram performed intracranially continued to demonstrate a completely revascularized right middle cerebral artery distribution. An anterior cerebral artery A1 segment again was visualized. The balloon guide was then retrieved and removed. The 8 French sheath was left in place and connected to continuous heparinized saline infusion. No evidence of hematoma or bleeding was seen. The distal pulses remained Dopplerable in the dorsalis pedis, and the posterior tibial regions bilaterally unchanged. The patient was also left intubated on account of the IV tPA the patient had had, in the large-bore Pinnacle sheath. The patient was also given approximately 7.5 mg of Integrilin during the course of the treatment involving the right ICA angioplasty tandem lesion. Additionally it was decided to infuse Aggrastat over approximately 12 hours in order to obviate the potential for acute platelet aggregation at the site of the angioplasty, and also the stent. A flat panel CT of the brain demonstrated no evidence of gross midline shift or of hemorrhage. The patient was then transferred to the neuro ICU to continue with post revascularization management. IMPRESSION: Complete revascularization of previously noted large filling defect in the right middle cerebral artery distally extending into the proximal inferior division following proximal revascularization, and use of approximately 7.5 mg with a TICI 3 revascularization of the right middle cerebral artery distribution. Status post endovascular revascularization of symptomatic near complete occlusion of the right internal carotid artery proximally with a string sign with placement of stent following balloon angioplasty as described. Status post endovascular balloon angioplasty of significant stenosis secondary to a calcified atherosclerotic plaque involving the proximal and middle 1/3 of the  right internal carotid artery with patency of 80%. Non visualization of the right anterior cerebral artery A1 segment. Opacification of the right anterior cerebral artery at the level of the A2 segment from a communication with the left anterior cerebral artery A2 segment. 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 PLAN: Follow-up in the clinic 4 weeks post discharge. Electronically Signed   By: Luanne Bras M.D.   On: 03/23/2019 15:13   DG Chest Port 1 View  Result Date: 03/24/2019 CLINICAL DATA:  ET tube placed EXAM: PORTABLE CHEST 1 VIEW COMPARISON:  04/11/2019 FINDINGS: Endotracheal tube with the tip 3.6 cm above the carina. Nasogastric tube coursing below the diaphragm projecting over the stomach. Mild right basilar airspace disease. No pleural effusion or pneumothorax. Stable cardiomediastinal silhouette. No aggressive osseous lesion. IMPRESSION: ET tube with the tip 3.6 cm above the carina. Right lower lobe hazy airspace disease which may reflect atelectasis versus pneumonia. Electronically Signed   By: Kathreen Devoid   On: 03/24/2019 08:47   DG Chest Port 1 View  Result Date: 03/23/2019 CLINICAL DATA:  Respiratory failure. EXAM: PORTABLE CHEST 1 VIEW COMPARISON:  October 09, 2013 FINDINGS: An endotracheal tube is seen with its distal tip approximately 3.7 cm from the carina. A nasogastric tube is noted with its distal end overlying the body of the stomach. Very mild atelectasis and/or infiltrate is seen along the medial aspect of the left lung base. There is no evidence of a pleural effusion or pneumothorax. There is moderate to marked severity enlargement of the cardiac silhouette. The visualized skeletal structures are unremarkable. IMPRESSION: 1. Endotracheal tube and nasogastric tube in good position. 2. Moderate to marked severity enlargement of the cardiac silhouette. 3. Mild  left basilar atelectasis and/or infiltrate. Electronically  Signed   By: Virgina Norfolk M.D.   On: 04/10/2019 19:05   ECHOCARDIOGRAM COMPLETE  Result Date: 03/23/2019    ECHOCARDIOGRAM REPORT   Patient Name:   Jonathan Berger Date of Exam: 03/23/2019 Medical Rec #:  [id number]        Height:       71.0 in Accession #:    [id number]       Weight:       189.2 lb Date of Birth:  01/08/1932        BSA:          2.06 m Patient Age:    86 years         BP:           112/66 mmHg Patient Gender: M                HR:           55 bpm. Exam Location:  Inpatient Procedure: 2D Echo, Cardiac Doppler and Color Doppler Indications:    CVA  History:        Patient has no prior history of Echocardiogram examinations.                 Stroke, Arrythmias:Bradycardia; Risk Factors:Hypertension.  Sonographer:    Dustin Flock Referring Phys: [id number] ERIC LINDZEN  Sonographer Comments: Echo performed with patient supine and on artificial respirator. Image acquisition challenging due to respiratory motion. IMPRESSIONS  1. Left ventricular ejection fraction, by estimation, is 45%. The left ventricle has mildly decreased function. The left ventrical demonstrates regional wall motion abnormalities (see scoring diagram/findings for description). Akinesis of the mid to apical anteroseptal and inferoseptal walls. Left ventricular diastolic parameters are consistent with Grade I diastolic dysfunction (impaired relaxation).  2. Right ventricular systolic function is mildly reduced. The right ventricular size is mildly enlarged. There is mildly elevated pulmonary artery systolic pressure. The estimated right ventricular systolic pressure is 77.9 mmHg.  3. The aortic valve is tricuspid. Trivial aortic insufficiency, no aortic stenosis.  4. No significant mitral regurgitation.  5. Dilated IVC with estimated RA pressure 15 mmHg. FINDINGS  Left Ventricle: Left ventricular ejection fraction, by estimation, is 45%. The left ventricle has mildly decreased function. The left ventricle demonstrates regional  wall motion abnormalities. The left ventricular internal cavity size was normal in size. There is mildly increased left ventricular hypertrophy. Right Ventricle: The right ventricular size is mildly enlarged. No increase in right ventricular wall thickness. Right ventricular systolic function is mildly reduced. There is mildly elevated pulmonary artery systolic pressure. The tricuspid regurgitant  velocity is 2.54 m/s, and with an assumed right atrial pressure of 8 mmHg, the estimated right ventricular systolic pressure is 39.0 mmHg. Left Atrium: Left atrial size was normal in size. Right Atrium: Right atrial size was normal in size. Pericardium: There is no evidence of pericardial effusion. Mitral Valve: The mitral valve is normal in structure and function. No evidence of mitral valve regurgitation. No evidence of mitral valve stenosis. Tricuspid Valve: The tricuspid valve is normal in structure. Tricuspid valve regurgitation is mild. Aortic Valve: The aortic valve is tricuspid. Aortic valve regurgitation is trivial. Mild aortic valve sclerosis is present, with no evidence of aortic valve stenosis. Pulmonic Valve: The pulmonic valve was normal in structure. Pulmonic valve regurgitation is not visualized. Aorta: The aortic root and ascending aorta are structurally normal, with no evidence of dilitation. Venous: The inferior vena cava is dilated  in size with less than 50% respiratory variability, suggesting right atrial pressure of 15 mmHg. IAS/Shunts: No atrial level shunt detected by color flow Doppler.  LEFT VENTRICLE PLAX 2D LVIDd:         4.63 cm  Diastology LVIDs:         3.40 cm  LV e' lateral:   4.35 cm/s LV PW:         1.37 cm  LV E/e' lateral: 10.5 LV IVS:        1.36 cm  LV e' medial:    4.24 cm/s LVOT diam:     2.20 cm  LV E/e' medial:  10.8 LV SV:         62.72 ml LV SV Index:   24.65 LVOT Area:     3.80 cm  RIGHT VENTRICLE RV Basal diam:  3.02 cm RV S prime:     5.87 cm/s TAPSE (M-mode): 3.3 cm LEFT  ATRIUM           Index       RIGHT ATRIUM           Index LA diam:      4.30 cm 2.09 cm/m  RA Area:     17.80 cm LA Vol (A4C): 49.5 ml 24.04 ml/m RA Volume:   45.40 ml  22.05 ml/m  AORTIC VALVE LVOT Vmax:   75.00 cm/s LVOT Vmean:  51.800 cm/s LVOT VTI:    0.165 m  AORTA Ao Root diam: 2.90 cm MITRAL VALVE                        TRICUSPID VALVE MV Area (PHT): 2.16 cm             TR Peak grad:   25.8 mmHg MV Decel Time: 352 msec             TR Vmax:        254.00 cm/s MV E velocity: 45.80 cm/s 103 cm/s MV A velocity: 62.10 cm/s 70.3 cm/s SHUNTS MV E/A ratio:  0.74       1.5       Systemic VTI:  0.16 m                                     Systemic Diam: 2.20 cm Loralie Champagne MD Electronically signed by Loralie Champagne MD Signature Date/Time: 03/23/2019/3:23:23 PM    Final    IR ENDOVASC INTRACRANIAL INF OTHER THAN THROMBO ART INC DIAG ANGIO  Result Date: 03/24/2019 INDICATION: New onset of slurred speech and left-sided weakness. Left-sided neglect. CT angiogram of the head and neck revealing a pre occlusive severe stenosis of the right internal carotid proximally with angiographic string sign.  Large non occlusive filling defect in the right middle cerebral artery distally extending into the inferior division proximally.  EXAM: 1. EMERGENT LARGE VESSEL OCCLUSION THROMBOLYSIS (anterior CIRCULATION)  COMPARISON:  CT angiogram of the head and neck of March 22, 2019.  MEDICATIONS: Ancef 2 g IV antibiotic was administered within 1 hour of the procedure.  ANESTHESIA/SEDATION: General anesthesia.  CONTRAST:  Omnipaque 300 approximately 120 mL.  FLUOROSCOPY TIME:  Fluoroscopy Time: 76 minutes 48 seconds (1992 mGy).  COMPLICATIONS: None immediate.  TECHNIQUE: Following a full explanation of the procedure along with the potential associated complications, an informed witnessed consent was obtained from the wife. The risks of intracranial hemorrhage of  10%, worsening neurological deficit, ventilator dependency, death  and inability to revascularize were all reviewed in detail with the patient's spouse.  The patient was then put under general anesthesia by the Department of Anesthesiology at [facility], [place].  The right groin was prepped and draped in the usual sterile fashion. Thereafter using modified Seldinger technique, transfemoral access into the right common femoral artery was obtained without difficulty. Over a 0.035 inch guidewire a 5 French Pinnacle sheath was inserted. Through this, and also over a 0.035 inch guidewire a 5 [place] JB 1 catheter was advanced to the aortic arch region and selectively positioned in the innominate artery and the right common carotid artery.  FINDINGS: The innominate artery angiogram demonstrates the origin of the right subclavian artery and the right common carotid artery to be widely patent.  There is mild narrowing of the origin of the right vertebral artery.  The vessel is, otherwise, seen to opacify to the cranial skull base to the level of the right posterior-inferior cerebellar artery.  The right common carotid arteriogram demonstrates the origin the right external carotid artery and its major branches to be widely patent.  The right internal carotid artery at the bulb and just distally demonstrates severe pre occlusive stenosis with an angiographic string sign.  No focal area of severe stenosis also noted in the right internal carotid artery proximally. More distally there is decreased caliber of the right internal carotid artery to the cranial skull base. The left common carotid arteriogram demonstrates the left external carotid artery and its major branches to be widely patent.  The right internal carotid artery at the bulb demonstrates a high-grade stenosis of approximately 75% in the lateral projection associated with a segmental atherosclerotic plaque.  More distally the vessel is seen to opacify to the cranial skull base. The petrous, cavernous and the supraclinoid  left ICA demonstrates wide patency.  The left anterior and the left middle cerebral artery opacify into the capillary and venous phases. There is mild to moderate narrowing of the left anterior cerebral A1 segment.  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  Delayed arterial and capillary phases demonstrate opacification of the right anterior cerebral artery territory distal to the A2 segment.  PROCEDURE: The diagnostic JB 1 catheter in the right common carotid artery was then exchanged over a 0.035 inch 300 cm Rosen exchange guidewire for an 8 [place] Pinnacle sheath in the right groin which was then connected to continuous heparinized saline infusion. The patient was loaded with aspirin 81 mg, and Brilinta 180 mg via an orogastric tube in anticipation for revascularization of the right internal carotid artery proximally.  Over the exchange guidewire, an 087 balloon guide catheter which had been prepped with 50% contrast and 50% heparinized saline infusion was advanced and positioned just proximal to the right common carotid bifurcation. The guidewire was removed. Good aspiration was obtained from the hub of the balloon guide catheter. A gentle control arteriogram performed through this continued to demonstrate no change in the severely pre occlusive narrowing of the right internal carotid artery at the bulb, and also in the proximal right internal carotid artery.  Over a 0.014 inch standard Synchro micro guidewire, an 021 Trevo ProVue microcatheter was advanced to the distal end of the balloon guide catheter. The micro guidewire was  then gently manipulated using a torque device and advanced without difficulty to the cervical petrous junction. However, advancement of the 021 Trevo ProVue micro  catheter was met with significant resistance at the upper tandem calcified lesion.  This was then replaced instead with a 017 Headway 2 tip microcatheter which was again advanced over a 0.014 inch standard Synchro micro guidewire without difficulty to the cervical petrous junction.  The micro guidewire was removed. Good aspiration obtained from the hub of the microcatheter. This in turn was then exchanged for an 014 inch softip Transend 300 cm exchange micro guidewire with a J configuration.  Control arteriogram performed through the balloon guide catheter demonstrated improved flow through the right internal carotid artery proximally and distally. A 4 mm x 30 mm Viatrac 14 balloon angioplasty catheter was then prepped and purged with heparinized saline infusion.  Using the rapid exchange technique this was then advanced and positioned without difficulty across the proximal right ICA stenosis.  Control inflation was then carried out using micro inflation syringe device via micro tubing to the normal pressure where it was maintained for approximately 20 seconds. At this time, there was significant bradycardia noted. This prompted immediate deflation and retrieval of the balloon whilst the exchange micro guidewire was kept and positioned in the petrous segment of the right internal carotid artery.  A control arteriogram performed through the balloon guide catheter in the right common carotid artery now demonstrated significantly improved caliber and flow through the proximally angioplastied segment of the right internal carotid artery now revealing the significant stenosis distal to this involving the calcified plaque.  More distally, free flow was seen in the right internal carotid artery to the cranial skull base in the petrous cavernous and supraclinoid segments. The right middle cerebral artery distribution was noted to be widely patent with no evidence of filling defect in the right middle cerebral artery  over its distal territories.  A TICI 3 revascularization had been achieved.  There was no visualization of the right anterior cerebral artery A1 segment. This was also noted to be on the CT angiogram of the head and neck performed earlier.  Measurements were then performed of the right internal carotid artery in the most normal appearance angiographically distal to the proximal angioplasty segment, and also the right common carotid artery. It was decided to proceed with placement of a 6/8 mm x 40 mm Xact stent.  The delivery catheter of the stent was then prepped and purged with heparinized saline infusion retrogradely.  Again using the rapid exchange technique, this was then advanced without difficulty to the right common carotid bifurcation.  The distal portion of the stent was positioned such that it was proximal to the more tandem heavily calcified lesion.  This stent was then deployed without difficulty. The delivery system was then retrieved and removed.  A control arteriogram performed through the balloon guide catheter in the right common carotid artery now demonstrates significantly improved caliber and flow through the stented segment and also through the internal carotid artery proximally and distally.  There continued to be the calcified significant stenosis involving the proximal right ICA as mentioned earlier.  Measurements were performed of the right internal carotid artery in the C1-C2 junction, and also aspirate was the noted in the right internal carotid artery at the proximal aspect of the initial stent.  Over the exchange micro guidewire, a 4 mm x 22 mm Resolute Onyx drug-eluting balloon mounted stent was prepped and purged with heparinized saline infusion  and also with 50% contrast and 50% heparinized saline infusion.  This was then advanced again using the rapid exchange technique without difficulty into the distal portion of the initial stent. However, this stent could not be  advanced into the C1-C2 junction on account of the severe calcific plaque.  This is most likely related to calcified ledge protruding intraluminally.  After multiple attempts, the balloon mounted stent delivery system was retrieved and removed.  It was, therefore, decided to proceed with balloon angioplasty. A TREK 3 mm x 12 mm coronary angioplasty balloon catheter was prepped and purged with heparinized saline infusion. Using the rapid exchange technique, this was advanced without difficulty and positioned in the distal and proximal markers at the site of the severe high-grade stenosis. A control angioplasty was then performed again with a micro inflation syringe device via micro tubing. This was carried to 6 atmospheres where it was maintained for approximately 90 seconds. Upon deflation and proximal retrieval, a control arteriogram performed through the balloon guide catheter in the right common carotid artery now demonstrated significantly improved caliber and flow through the angioplastied segment also distally.  It was elected to try advancement of the previously Resolute Onyx 4 x 22 mm balloon mounted stent in view of the improved caliber following the angioplasty.  At this time the stent delivery catheter was advanced more distally. However, further advancement was again met middle with significant resistance secondary to calcified plaque.  Again after multiple trials, this was abandoned.  Control arteriogram performed through the balloon guide catheter in the right common carotid artery continued to demonstrate significantly improved caliber and flow through the angioplastied segment of the right internal carotid artery.  There was approximately 80% patency noted at the site of the angioplasty.  10 minute control arteriogram performed through the balloon guide catheter continued to demonstrate significantly improved caliber and flow through the angioplastied segment.  A control arteriogram  performed intracranially continued to demonstrate a completely revascularized right middle cerebral artery distribution. An anterior cerebral artery A1 segment again was visualized.  The balloon guide was then retrieved and removed. The 8 French sheath was left in place and connected to continuous heparinized saline infusion. No evidence of hematoma or bleeding was seen. The distal pulses remained Dopplerable in the dorsalis pedis, and the posterior tibial regions bilaterally unchanged.  The patient was also left intubated on account of the IV tPA the patient had had, in the large-bore Pinnacle sheath.  The patient was also given approximately 7.5 mg of Integrilin during the course of the treatment involving the right ICA angioplasty tandem lesion. Additionally it was decided to infuse Aggrastat over approximately 12 hours in order to obviate the potential for acute platelet aggregation at the site of the angioplasty, and also the stent.  A flat panel CT of the brain demonstrated no evidence of gross midline shift or of hemorrhage.  The patient was then transferred to the neuro ICU to continue with post revascularization management.  IMPRESSION: Complete revascularization of previously noted large filling defect in the right middle cerebral artery distally extending into the proximal inferior division following proximal revascularization, and use of approximately 7.5 mg with a TICI 3 revascularization of the right middle cerebral artery distribution.  Status post endovascular revascularization of symptomatic near complete occlusion of the right internal carotid artery proximally with a string sign with placement of stent following balloon angioplasty as described.  Status post endovascular balloon angioplasty of significant stenosis secondary to a calcified atherosclerotic plaque involving  the proximal and middle 1/3 of the right internal carotid artery with patency of 80%.  Non visualization of the right  anterior cerebral artery A1 segment. Opacification of the right anterior cerebral artery at the level of the A2 segment from a communication with the left anterior cerebral artery A2 segment. 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  PLAN: Follow-up in the clinic 4 weeks post discharge.   Electronically Signed   By: Luanne Bras M.D.   On: 03/23/2019 15:13  CT HEAD CODE STROKE WO CONTRAST  Result Date: 03/18/2019 CLINICAL DATA:  Code stroke. 86 year old male with left facial droop and aphasia. EXAM: CT HEAD WITHOUT CONTRAST TECHNIQUE: Contiguous axial images were obtained from the base of the skull through the vertex without intravenous contrast. COMPARISON:  Head CT 12/25/2016. FINDINGS: Brain: Cerebral volume is not significantly changed since 2018. No midline shift, mass effect, or evidence of intracranial mass lesion. No ventriculomegaly. No acute intracranial hemorrhage identified. Scattered bilateral cerebral white matter hypodensity has increased. The deep gray nuclei, brainstem and cerebellum remain within normal limits. No cortically based acute infarct identified. Vascular: Calcified atherosclerosis at the skull base. There is asymmetric right MCA M1 or proximal M2 hyperdensity best seen on series 5, image 29 which is new from 2018. Skull: Negative. Sinuses/Orbits: Visualized paranasal sinuses and mastoids are stable and well pneumatized. Other: Negative orbit and scalp soft tissues. ASPECTS [facility] Stroke Program Early CT Score) Total score (0-10 with 10 being normal): 10 IMPRESSION: 1. Hyperdense Right MCA suspicious for emergent large vessel occlusion. But no CT changes of acute cortically based infarct identified. ASPECTS 10. 2. No acute intracranial hemorrhage. Increased scattered white matter hypodensity since 2018. 3. These results were communicated to Dr. [name] at 1:14 pmon 02/15/2021by text page via the [facility] messaging  system. Electronically Signed   By: Genevie Ann M.D.   On: 03/19/2019 13:16   [place] EKG SITE RITE  Result Date: 03/24/2019 If Site Rite image not attached, placement could not be confirmed due to current cardiac rhythm.  IR ANGIO INTRA EXTRACRAN SEL COM CAROTID INNOMINATE UNI L MOD SED  Result Date: 04/01/2019 INDICATION: New onset of slurred speech and left-sided weakness. Left-sided neglect. CT angiogram of the head and neck revealing a pre occlusive severe stenosis of the right internal carotid proximally with angiographic string sign.  Large non occlusive filling defect in the right middle cerebral artery distally extending into the inferior division proximally.  EXAM: 1. EMERGENT LARGE VESSEL OCCLUSION THROMBOLYSIS (anterior CIRCULATION)  COMPARISON:  CT angiogram of the head and neck of March 22, 2019.  MEDICATIONS: Ancef 2 g IV antibiotic was administered within 1 hour of the procedure.  ANESTHESIA/SEDATION: General anesthesia.  CONTRAST:  Omnipaque 300 approximately 120 mL.  FLUOROSCOPY TIME:  Fluoroscopy Time: 76 minutes 48 seconds (1992 mGy).  COMPLICATIONS: None immediate.  TECHNIQUE: Following a full explanation of the procedure along with the potential associated complications, an informed witnessed consent was obtained from the wife. The risks of intracranial hemorrhage of 10%, worsening neurological deficit, ventilator dependency, death and inability to revascularize were all reviewed in detail with the patient's spouse.  The patient was then put under general anesthesia by the Department of Anesthesiology at [facility].  The right groin was prepped and draped in the usual sterile fashion. Thereafter using modified Seldinger technique, transfemoral access into the right common femoral artery was obtained without difficulty. Over a 0.035 inch guidewire a  5 French Pinnacle sheath was inserted. Through this, and also over a 0.035 inch guidewire a 5 [place] JB 1 catheter was advanced  to the aortic arch region and selectively positioned in the innominate artery and the right common carotid artery.  FINDINGS: The innominate artery angiogram demonstrates the origin of the right subclavian artery and the right common carotid artery to be widely patent.  There is mild narrowing of the origin of the right vertebral artery.  The vessel is, otherwise, seen to opacify to the cranial skull base to the level of the right posterior-inferior cerebellar artery.  The right common carotid arteriogram demonstrates the origin the right external carotid artery and its major branches to be widely patent.  The right internal carotid artery at the bulb and just distally demonstrates severe pre occlusive stenosis with an angiographic string sign.  No focal area of severe stenosis also noted in the right internal carotid artery proximally. More distally there is decreased caliber of the right internal carotid artery to the cranial skull base. The left common carotid arteriogram demonstrates the left external carotid artery and its major branches to be widely patent.  The right internal carotid artery at the bulb demonstrates a high-grade stenosis of approximately 75% in the lateral projection associated with a segmental atherosclerotic plaque.  More distally the vessel is seen to opacify to the cranial skull base. The petrous, cavernous and the supraclinoid left ICA demonstrates wide patency.  The left anterior and the left middle cerebral artery opacify into the capillary and venous phases. There is mild to moderate narrowing of the left anterior cerebral A1 segment.  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  Delayed arterial and capillary phases demonstrate opacification of the right  anterior cerebral artery territory distal to the A2 segment.  PROCEDURE: The diagnostic JB 1 catheter in the right common carotid artery was then exchanged over a 0.035 inch 300 cm Rosen exchange guidewire for an 8 [place] Pinnacle sheath in the right groin which was then connected to continuous heparinized saline infusion. The patient was loaded with aspirin 81 mg, and Brilinta 180 mg via an orogastric tube in anticipation for revascularization of the right internal carotid artery proximally.  Over the exchange guidewire, an 087 balloon guide catheter which had been prepped with 50% contrast and 50% heparinized saline infusion was advanced and positioned just proximal to the right common carotid bifurcation. The guidewire was removed. Good aspiration was obtained from the hub of the balloon guide catheter. A gentle control arteriogram performed through this continued to demonstrate no change in the severely pre occlusive narrowing of the right internal carotid artery at the bulb, and also in the proximal right internal carotid artery.  Over a 0.014 inch standard Synchro micro guidewire, an 021 Trevo ProVue microcatheter was advanced to the distal end of the balloon guide catheter. The micro guidewire was then gently manipulated using a torque device and advanced without difficulty to the cervical petrous junction. However, advancement of the 021 Trevo ProVue micro catheter was met with significant resistance at the upper tandem calcified lesion.  This was then replaced instead with a 017 Headway 2 tip microcatheter which was again advanced over a 0.014 inch standard Synchro micro guidewire without difficulty to the cervical petrous junction.  The micro guidewire was removed.  Good aspiration obtained from the hub of the microcatheter. This in turn was then exchanged for an 014 inch softip Transend 300 cm exchange micro guidewire with a J configuration.  Control arteriogram performed through the balloon guide  catheter demonstrated improved flow through the right internal carotid artery proximally and distally. A 4 mm x 30 mm Viatrac 14 balloon angioplasty catheter was then prepped and purged with heparinized saline infusion.  Using the rapid exchange technique this was then advanced and positioned without difficulty across the proximal right ICA stenosis.  Control inflation was then carried out using micro inflation syringe device via micro tubing to the normal pressure where it was maintained for approximately 20 seconds. At this time, there was significant bradycardia noted. This prompted immediate deflation and retrieval of the balloon whilst the exchange micro guidewire was kept and positioned in the petrous segment of the right internal carotid artery.  A control arteriogram performed through the balloon guide catheter in the right common carotid artery now demonstrated significantly improved caliber and flow through the proximally angioplastied segment of the right internal carotid artery now revealing the significant stenosis distal to this involving the calcified plaque.  More distally, free flow was seen in the right internal carotid artery to the cranial skull base in the petrous cavernous and supraclinoid segments. The right middle cerebral artery distribution was noted to be widely patent with no evidence of filling defect in the right middle cerebral artery over its distal territories.  A TICI 3 revascularization had been achieved.  There was no visualization of the right anterior cerebral artery A1 segment. This was also noted to be on the CT angiogram of the head and neck performed earlier.  Measurements were then performed of the right internal carotid artery in the most normal appearance angiographically distal to the proximal angioplasty segment, and also the right common carotid artery. It was decided to proceed with placement of a 6/8 mm x 40 mm Xact stent.  The delivery catheter of the stent  was then prepped and purged with heparinized saline infusion retrogradely.  Again using the rapid exchange technique, this was then advanced without difficulty to the right common carotid bifurcation.  The distal portion of the stent was positioned such that it was proximal to the more tandem heavily calcified lesion.  This stent was then deployed without difficulty. The delivery system was then retrieved and removed.  A control arteriogram performed through the balloon guide catheter in the right common carotid artery now demonstrates significantly improved caliber and flow through the stented segment and also through the internal carotid artery proximally and distally.  There continued to be the calcified significant stenosis involving the proximal right ICA as mentioned earlier.  Measurements were performed of the right internal carotid artery in the C1-C2 junction, and also aspirate was the noted in the right internal carotid artery at the proximal aspect of the initial stent.  Over the exchange micro guidewire, a 4 mm x 22 mm Resolute Onyx drug-eluting balloon mounted stent was prepped and purged with heparinized saline infusion and also with 50% contrast and 50% heparinized saline infusion.  This was then advanced again using the rapid exchange technique without difficulty into the distal portion of the initial stent. However, this stent could not be advanced into the C1-C2 junction on account of the severe calcific plaque.  This is most likely related to calcified ledge protruding intraluminally.  After multiple attempts, the balloon mounted stent delivery system was retrieved and removed.  It was, therefore, decided to proceed with balloon angioplasty. A TREK 3 mm x 12 mm coronary angioplasty balloon catheter was prepped and purged with heparinized saline infusion. Using the rapid exchange technique, this was advanced without difficulty and positioned in the distal and proximal markers at the site  of the severe high-grade stenosis. A control angioplasty was then performed again with a micro inflation syringe device via micro tubing. This was carried to 6 atmospheres where it was maintained for approximately 90 seconds. Upon deflation and proximal retrieval, a control arteriogram performed through the balloon guide catheter in the right common carotid artery now demonstrated significantly improved caliber and flow through the angioplastied segment also distally.  It was elected to try advancement of the previously Resolute Onyx 4 x 22 mm balloon mounted stent in view of the improved caliber following the angioplasty.  At this time the stent delivery catheter was advanced more distally. However, further advancement was again met middle with significant resistance secondary to calcified plaque.  Again after multiple trials, this was abandoned.  Control arteriogram performed through the balloon guide catheter in the right common carotid artery continued to demonstrate significantly improved caliber and flow through the angioplastied segment of the right internal carotid artery.  There was approximately 80% patency noted at the site of the angioplasty.  10 minute control arteriogram performed through the balloon guide catheter continued to demonstrate significantly improved caliber and flow through the angioplastied segment.  A control arteriogram performed intracranially continued to demonstrate a completely revascularized right middle cerebral artery distribution. An anterior cerebral artery A1 segment again was visualized.  The balloon guide was then retrieved and removed. The 8 French sheath was left in place and connected to continuous heparinized saline infusion. No evidence of hematoma or bleeding was seen. The distal pulses remained Dopplerable in the dorsalis pedis, and the posterior tibial regions bilaterally unchanged.  The patient was also left intubated on account of the IV tPA the patient  had had, in the large-bore Pinnacle sheath.  The patient was also given approximately 7.5 mg of Integrilin during the course of the treatment involving the right ICA angioplasty tandem lesion. Additionally it was decided to infuse Aggrastat over approximately 12 hours in order to obviate the potential for acute platelet aggregation at the site of the angioplasty, and also the stent.  A flat panel CT of the brain demonstrated no evidence of gross midline shift or of hemorrhage.  The patient was then transferred to the neuro ICU to continue with post revascularization management.  IMPRESSION: Complete revascularization of previously noted large filling defect in the right middle cerebral artery distally extending into the proximal inferior division following proximal revascularization, and use of approximately 7.5 mg with a TICI 3 revascularization of the right middle cerebral artery distribution.  Status post endovascular revascularization of symptomatic near complete occlusion of the right internal carotid artery proximally with a string sign with placement of stent following balloon angioplasty as described.  Status post endovascular balloon angioplasty of significant stenosis secondary to a calcified atherosclerotic plaque involving the proximal and middle 1/3 of the right internal carotid artery with patency of 80%.  Non visualization of the right anterior cerebral artery A1 segment. Opacification of the right anterior cerebral artery at the level of the A2 segment from a communication with the left anterior cerebral artery A2 segment. 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  PLAN: Follow-up in the clinic 4  weeks post discharge.   Electronically Signed   By: Luanne Bras M.D.   On: 03/23/2019 15:13  IR ANGIO VERTEBRAL SEL SUBCLAVIAN INNOMINATE UNI L MOD SED  Result Date: 04/09/2019 INDICATION: New onset of slurred speech and  left-sided weakness. Left-sided neglect. CT angiogram of the head and neck revealing a pre occlusive severe stenosis of the right internal carotid proximally with angiographic string sign.  Large non occlusive filling defect in the right middle cerebral artery distally extending into the inferior division proximally.  EXAM: 1. EMERGENT LARGE VESSEL OCCLUSION THROMBOLYSIS (anterior CIRCULATION)  COMPARISON:  CT angiogram of the head and neck of March 22, 2019.  MEDICATIONS: Ancef 2 g IV antibiotic was administered within 1 hour of the procedure.  ANESTHESIA/SEDATION: General anesthesia.  CONTRAST:  Omnipaque 300 approximately 120 mL.  FLUOROSCOPY TIME:  Fluoroscopy Time: 76 minutes 48 seconds (1992 mGy).  COMPLICATIONS: None immediate.  TECHNIQUE: Following a full explanation of the procedure along with the potential associated complications, an informed witnessed consent was obtained from the wife. The risks of intracranial hemorrhage of 10%, worsening neurological deficit, ventilator dependency, death and inability to revascularize were all reviewed in detail with the patient's spouse.  The patient was then put under general anesthesia by the Department of Anesthesiology at [facility].  The right groin was prepped and draped in the usual sterile fashion. Thereafter using modified Seldinger technique, transfemoral access into the right common femoral artery was obtained without difficulty. Over a 0.035 inch guidewire a 5 French Pinnacle sheath was inserted. Through this, and also over a 0.035 inch guidewire a 5 [place] JB 1 catheter was advanced to the aortic arch region and selectively positioned in the innominate artery and the right common carotid artery.  FINDINGS: The innominate artery angiogram demonstrates the origin of the right subclavian artery and the right common carotid artery to be widely patent.  There is mild narrowing of the origin of the right vertebral artery.  The vessel  is, otherwise, seen to opacify to the cranial skull base to the level of the right posterior-inferior cerebellar artery.  The right common carotid arteriogram demonstrates the origin the right external carotid artery and its major branches to be widely patent.  The right internal carotid artery at the bulb and just distally demonstrates severe pre occlusive stenosis with an angiographic string sign.  No focal area of severe stenosis also noted in the right internal carotid artery proximally. More distally there is decreased caliber of the right internal carotid artery to the cranial skull base. The left common carotid arteriogram demonstrates the left external carotid artery and its major branches to be widely patent.  The right internal carotid artery at the bulb demonstrates a high-grade stenosis of approximately 75% in the lateral projection associated with a segmental atherosclerotic plaque.  More distally the vessel is seen to opacify to the cranial skull base. The petrous, cavernous and the supraclinoid left ICA demonstrates wide patency.  The left anterior and the left middle cerebral artery opacify into the capillary and venous phases. There is mild to moderate narrowing of the left anterior cerebral A1 segment.  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  Delayed arterial and capillary phases demonstrate opacification of the right anterior cerebral artery territory distal to the A2 segment.  PROCEDURE:  The diagnostic JB 1 catheter in the right common carotid artery was then exchanged over a 0.035 inch 300 cm Rosen exchange guidewire for an 8 [place] Pinnacle sheath in the right groin which was then connected to continuous heparinized saline infusion. The patient was loaded with  aspirin 81 mg, and Brilinta 180 mg via an orogastric tube in anticipation for revascularization of the right internal carotid artery proximally.  Over the exchange guidewire, an 087 balloon guide catheter which had been prepped with 50% contrast and 50% heparinized saline infusion was advanced and positioned just proximal to the right common carotid bifurcation. The guidewire was removed. Good aspiration was obtained from the hub of the balloon guide catheter. A gentle control arteriogram performed through this continued to demonstrate no change in the severely pre occlusive narrowing of the right internal carotid artery at the bulb, and also in the proximal right internal carotid artery.  Over a 0.014 inch standard Synchro micro guidewire, an 021 Trevo ProVue microcatheter was advanced to the distal end of the balloon guide catheter. The micro guidewire was then gently manipulated using a torque device and advanced without difficulty to the cervical petrous junction. However, advancement of the 021 Trevo ProVue micro catheter was met with significant resistance at the upper tandem calcified lesion.  This was then replaced instead with a 017 Headway 2 tip microcatheter which was again advanced over a 0.014 inch standard Synchro micro guidewire without difficulty to the cervical petrous junction.  The micro guidewire was removed. Good aspiration obtained from the hub of the microcatheter. This in turn was then exchanged for an 014 inch softip Transend 300 cm exchange micro guidewire with a J configuration.  Control arteriogram performed through the balloon guide catheter demonstrated improved flow through the right internal carotid artery proximally and distally. A 4 mm x 30 mm Viatrac 14 balloon angioplasty catheter was then prepped and purged with heparinized saline infusion.  Using the rapid exchange technique this was then advanced and positioned without difficulty across the proximal right ICA stenosis.   Control inflation was then carried out using micro inflation syringe device via micro tubing to the normal pressure where it was maintained for approximately 20 seconds. At this time, there was significant bradycardia noted. This prompted immediate deflation and retrieval of the balloon whilst the exchange micro guidewire was kept and positioned in the petrous segment of the right internal carotid artery.  A control arteriogram performed through the balloon guide catheter in the right common carotid artery now demonstrated significantly improved caliber and flow through the proximally angioplastied segment of the right internal carotid artery now revealing the significant stenosis distal to this involving the calcified plaque.  More distally, free flow was seen in the right internal carotid artery to the cranial skull base in the petrous cavernous and supraclinoid segments. The right middle cerebral artery distribution was noted to be widely patent with no evidence of filling defect in the right middle cerebral artery over its distal territories.  A TICI 3 revascularization had been achieved.  There was no visualization of the right anterior cerebral artery A1 segment. This was also noted to be on the CT angiogram of the head and neck performed earlier.  Measurements were then performed of the right internal carotid artery in the most normal appearance angiographically distal to the proximal angioplasty segment, and also the right common carotid artery. It was decided to proceed with placement of a 6/8 mm x 40 mm Xact stent.  The delivery  catheter of the stent was then prepped and purged with heparinized saline infusion retrogradely.  Again using the rapid exchange technique, this was then advanced without difficulty to the right common carotid bifurcation.  The distal portion of the stent was positioned such that it was proximal to the more tandem heavily calcified lesion.  This stent was then deployed  without difficulty. The delivery system was then retrieved and removed.  A control arteriogram performed through the balloon guide catheter in the right common carotid artery now demonstrates significantly improved caliber and flow through the stented segment and also through the internal carotid artery proximally and distally.  There continued to be the calcified significant stenosis involving the proximal right ICA as mentioned earlier.  Measurements were performed of the right internal carotid artery in the C1-C2 junction, and also aspirate was the noted in the right internal carotid artery at the proximal aspect of the initial stent.  Over the exchange micro guidewire, a 4 mm x 22 mm Resolute Onyx drug-eluting balloon mounted stent was prepped and purged with heparinized saline infusion and also with 50% contrast and 50% heparinized saline infusion.  This was then advanced again using the rapid exchange technique without difficulty into the distal portion of the initial stent. However, this stent could not be advanced into the C1-C2 junction on account of the severe calcific plaque.  This is most likely related to calcified ledge protruding intraluminally.  After multiple attempts, the balloon mounted stent delivery system was retrieved and removed.  It was, therefore, decided to proceed with balloon angioplasty. A TREK 3 mm x 12 mm coronary angioplasty balloon catheter was prepped and purged with heparinized saline infusion. Using the rapid exchange technique, this was advanced without difficulty and positioned in the distal and proximal markers at the site of the severe high-grade stenosis. A control angioplasty was then performed again with a micro inflation syringe device via micro tubing. This was carried to 6 atmospheres where it was maintained for approximately 90 seconds. Upon deflation and proximal retrieval, a control arteriogram performed through the balloon guide catheter in the right common  carotid artery now demonstrated significantly improved caliber and flow through the angioplastied segment also distally.  It was elected to try advancement of the previously Resolute Onyx 4 x 22 mm balloon mounted stent in view of the improved caliber following the angioplasty.  At this time the stent delivery catheter was advanced more distally. However, further advancement was again met middle with significant resistance secondary to calcified plaque.  Again after multiple trials, this was abandoned.  Control arteriogram performed through the balloon guide catheter in the right common carotid artery continued to demonstrate significantly improved caliber and flow through the angioplastied segment of the right internal carotid artery.  There was approximately 80% patency noted at the site of the angioplasty.  10 minute control arteriogram performed through the balloon guide catheter continued to demonstrate significantly improved caliber and flow through the angioplastied segment.  A control arteriogram performed intracranially continued to demonstrate a completely revascularized right middle cerebral artery distribution. An anterior cerebral artery A1 segment again was visualized.  The balloon guide was then retrieved and removed. The 8 French sheath was left in place and connected to continuous heparinized saline infusion. No evidence of hematoma or bleeding was seen. The distal pulses remained Dopplerable in the dorsalis pedis, and the posterior tibial regions bilaterally unchanged.  The patient was also left intubated on account of the IV tPA the patient had had,  in the large-bore Pinnacle sheath.  The patient was also given approximately 7.5 mg of Integrilin during the course of the treatment involving the right ICA angioplasty tandem lesion. Additionally it was decided to infuse Aggrastat over approximately 12 hours in order to obviate the potential for acute platelet aggregation at the site of the  angioplasty, and also the stent.  A flat panel CT of the brain demonstrated no evidence of gross midline shift or of hemorrhage.  The patient was then transferred to the neuro ICU to continue with post revascularization management.  IMPRESSION: Complete revascularization of previously noted large filling defect in the right middle cerebral artery distally extending into the proximal inferior division following proximal revascularization, and use of approximately 7.5 mg with a TICI 3 revascularization of the right middle cerebral artery distribution.  Status post endovascular revascularization of symptomatic near complete occlusion of the right internal carotid artery proximally with a string sign with placement of stent following balloon angioplasty as described.  Status post endovascular balloon angioplasty of significant stenosis secondary to a calcified atherosclerotic plaque involving the proximal and middle 1/3 of the right internal carotid artery with patency of 80%.  Non visualization of the right anterior cerebral artery A1 segment. Opacification of the right anterior cerebral artery at the level of the A2 segment from a communication with the left anterior cerebral artery A2 segment. 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  PLAN: Follow-up in the clinic 4 weeks post discharge.   Electronically Signed   By: Luanne Bras M.D.   On: 03/23/2019 15:13   Labs:  CBC: Recent Labs    04/01/2019 1303 03/18/2019 1308 03/23/19 0303 03/23/19 0303 03/23/19 1619 03/23/19 1640 03/24/19 0423 03/25/19 0507  WBC 9.5  --  14.3*  --   --   --  11.4* 9.1  HGB 16.1   < > 13.6   < > 12.6* 11.9* 12.8* 12.3*  HCT 48.0   < > 41.1   < > 38.5* 35.0* 39.5 38.6*  PLT 145*  --  159  --   --   --  130* PLATELET CLUMPS NOTED ON SMEAR, UNABLE TO ESTIMATE   < > = values in this interval not displayed.    COAGS: Recent Labs    03/26/2019 1303  INR  0.9  APTT 28    BMP: Recent Labs    04/09/2019 1303 03/21/2019 1303 04/02/2019 1308 04/03/2019 1308 03/23/19 0303 03/23/19 1640 03/24/19 0423 03/25/19 0507  NA 136   < > 137   < > 139 141 140 141  K 3.6   < > 3.6   < > 3.2* 3.8 3.9 3.7  CL 102   < > 102  --  105  --  111 114*  CO2 25  --   --   --  20*  --  21* 21*  GLUCOSE 160*   < > 155*  --  154*  --  145* 190*  BUN 22   < > 24*  --  17  --  13 17  CALCIUM 10.3  --   --   --  9.0  --  8.4* 8.5*  CREATININE 1.36*   < > 1.30*  --  1.17  --  1.19 1.32*  GFRNONAA 47*  --   --   --  56*  --  55* 49*  GFRAA 54*  --   --   --  >  60  --  >60 56*   < > = values in this interval not displayed.    LIVER FUNCTION TESTS: Recent Labs    03/20/2019 1303 03/24/19 0423 03/25/19 0507  BILITOT 0.8  --   --   AST 19  --   --   ALT 15  --   --   ALKPHOS 54  --   --   PROT 6.6  --   --   ALBUMIN 3.5 2.5* 2.2*    Assessment and Plan:  Acute CVA s/p cerebral arteriogram s/p emergent stent placement of proximal right ICA severe pre-occlusive stenosis along with balloon angioplasty of proximal/mid right ICA achieving a TICI 3 revascularization 03/20/2019 by Dr. Estanislado Pandy. Patient's condition stable- remains intubated/sedated, moves all extremities. Right groin incision stable, distal pulses 1+ bilaterally. Continue taking Brilinta 90 mg twice daily and Aspirin 81 mg once daily. Further plans per neurology/CCM- appreciate and agree with management. NIR to follow.   Electronically Signed: Earley Abide, PA-C 03/25/2019, 8:48 AM   I spent a total of 25 Minutes at the the patient's bedside AND on the patient's hospital floor or unit, greater than 50% of which was counseling/coordinating care for proximal right ICA pre-occlusive stenosis s/p revascularization.

## 2019-03-25 NOTE — Plan of Care (Signed)
  Problem: Nutrition: Goal: Dietary intake will improve Outcome: Progressing   Problem: Ischemic Stroke/TIA Tissue Perfusion: Goal: Complications of ischemic stroke/TIA will be minimized Outcome: Progressing   Problem: Nutrition: Goal: Adequate nutrition will be maintained Outcome: Progressing   Problem: Pain Managment: Goal: General experience of comfort will improve Outcome: Progressing   Problem: Skin Integrity: Goal: Risk for impaired skin integrity will decrease Outcome: Progressing   Problem: Activity: Goal: Risk for activity intolerance will decrease Outcome: Not Progressing   Problem: Elimination: Goal: Will not experience complications related to bowel motility Outcome: Not Progressing

## 2019-03-26 ENCOUNTER — Inpatient Hospital Stay (HOSPITAL_COMMUNITY): Payer: Medicare Other

## 2019-03-26 DIAGNOSIS — I491 Atrial premature depolarization: Secondary | ICD-10-CM

## 2019-03-26 DIAGNOSIS — J9690 Respiratory failure, unspecified, unspecified whether with hypoxia or hypercapnia: Secondary | ICD-10-CM | POA: Insufficient documentation

## 2019-03-26 LAB — BASIC METABOLIC PANEL
Anion gap: 7 (ref 5–15)
Anion gap: 11 (ref 5–15)
BUN: 18 mg/dL (ref 8–23)
BUN: 16 mg/dL (ref 8–23)
CO2: 19 mmol/L — ABNORMAL LOW (ref 22–32)
CO2: 25 mmol/L (ref 22–32)
Calcium: 7.8 mg/dL — ABNORMAL LOW (ref 8.9–10.3)
Calcium: 8.7 mg/dL — ABNORMAL LOW (ref 8.9–10.3)
Chloride: 112 mmol/L — ABNORMAL HIGH (ref 98–111)
Chloride: 107 mmol/L (ref 98–111)
Creatinine, Ser: 0.91 mg/dL (ref 0.61–1.24)
Creatinine, Ser: 1.17 mg/dL (ref 0.61–1.24)
GFR calc Af Amer: >60 mL/min (ref >60)
GFR calc Af Amer: >60 mL/min (ref >60)
GFR calc non Af Amer: >60 mL/min (ref >60)
GFR calc non Af Amer: 56 mL/min — ABNORMAL LOW (ref >60)
Glucose, Bld: 138 mg/dL — ABNORMAL HIGH (ref 70–99)
Glucose, Bld: 141 mg/dL — ABNORMAL HIGH (ref 70–99)
Potassium: 4.6 mmol/L (ref 3.5–5.1)
Potassium: 2.9 mmol/L — ABNORMAL LOW (ref 3.5–5.1)
Sodium: 138 mmol/L (ref 135–145)
Sodium: 143 mmol/L (ref 135–145)

## 2019-03-26 LAB — GLUCOSE, CAPILLARY
Glucose-Capillary: 130 mg/dL — ABNORMAL HIGH (ref 70–99)
Glucose-Capillary: 133 mg/dL — ABNORMAL HIGH (ref 70–99)
Glucose-Capillary: 104 mg/dL — ABNORMAL HIGH (ref 70–99)

## 2019-03-26 LAB — MAGNESIUM: Magnesium: 1.9 mg/dL (ref 1.7–2.4)

## 2019-03-26 LAB — CULTURE, RESPIRATORY W GRAM STAIN: Culture: NORMAL

## 2019-03-26 LAB — HEPARIN LEVEL (UNFRACTIONATED)
Heparin Unfractionated: 0.18 IU/mL — ABNORMAL LOW (ref 0.30–0.70)
Heparin Unfractionated: 0.27 IU/mL — ABNORMAL LOW (ref 0.30–0.70)

## 2019-03-26 LAB — CBC
HCT: 34 % — ABNORMAL LOW (ref 39.0–52.0)
Hemoglobin: 11.1 g/dL — ABNORMAL LOW (ref 13.0–17.0)
MCH: 33.4 pg (ref 26.0–34.0)
MCHC: 32.6 g/dL (ref 30.0–36.0)
MCV: 102.4 fL — ABNORMAL HIGH (ref 80.0–100.0)
Platelets: UNDETERMINED 10*3/uL (ref 150–400)
RBC: 3.32 MIL/uL — ABNORMAL LOW (ref 4.22–5.81)
RDW: 14.1 % (ref 11.5–15.5)
WBC: 7 10*3/uL (ref 4.0–10.5)
nRBC: 0 % (ref 0.0–0.2)

## 2019-03-26 LAB — PHOSPHORUS: Phosphorus: 1.3 mg/dL — ABNORMAL LOW (ref 2.5–4.6)

## 2019-03-26 MED ORDER — SODIUM CHLORIDE 3 % IN NEBU
4.0000 mL | INHALATION_SOLUTION | Freq: Every day | RESPIRATORY_TRACT | Status: AC
  Administered 2019-03-26 – 2019-03-28 (×3): 4 mL via RESPIRATORY_TRACT
  Filled 2019-03-26 (×3): qty 4

## 2019-03-26 MED ORDER — ORAL CARE MOUTH RINSE
15.0000 mL | Freq: Two times a day (BID) | OROMUCOSAL | Status: DC
  Administered 2019-03-26 – 2019-03-30 (×9): 15 mL via OROMUCOSAL

## 2019-03-26 MED ORDER — FUROSEMIDE 10 MG/ML IJ SOLN
40.0000 mg | Freq: Once | INTRAMUSCULAR | Status: AC
  Administered 2019-03-26: 40 mg via INTRAVENOUS
  Filled 2019-03-26: qty 4

## 2019-03-26 MED ORDER — FUROSEMIDE 10 MG/ML IJ SOLN
INTRAMUSCULAR | Status: AC
  Filled 2019-03-26: qty 4

## 2019-03-26 MED ORDER — METOPROLOL TARTRATE 25 MG PO TABS
25.0000 mg | ORAL_TABLET | Freq: Two times a day (BID) | ORAL | Status: DC
  Administered 2019-03-27 – 2019-03-30 (×7): 25 mg
  Filled 2019-03-26 (×7): qty 1

## 2019-03-26 MED ORDER — CHLORHEXIDINE GLUCONATE 0.12 % MT SOLN
15.0000 mL | Freq: Two times a day (BID) | OROMUCOSAL | Status: DC
  Administered 2019-03-26 – 2019-03-30 (×11): 15 mL via OROMUCOSAL
  Filled 2019-03-26 (×6): qty 15

## 2019-03-26 MED ORDER — METOPROLOL TARTRATE 5 MG/5ML IV SOLN
2.5000 mg | INTRAVENOUS | Status: DC | PRN
  Administered 2019-03-26 – 2019-03-31 (×4): 5 mg via INTRAVENOUS
  Administered 2019-03-31: 13:00:00 2.5 mg via INTRAVENOUS
  Filled 2019-03-26 (×6): qty 5

## 2019-03-26 NOTE — Progress Notes (Signed)
Nutrition Follow-up  DOCUMENTATION CODES:   Not applicable  INTERVENTION:   Monitor ability to advance diet.   NUTRITION DIAGNOSIS:   Inadequate oral intake related to inability to eat as evidenced by NPO status. Ongoing.   GOAL:   Patient will meet greater than or equal to 90% of their needs Not met.   MONITOR:   Labs, Weight trends, I & O's, Vent status, TF tolerance, Skin, Diet advancement  REASON FOR ASSESSMENT:   Consult, Ventilator Enteral/tube feeding initiation and management  ASSESSMENT:   86 year old male with past medical history of HTN , anemia, and chronic back pain presented to ED with slurred speech and left facial droop. CTA showed right ICA near occulusion with distal M1 thrombus extending into M2 branch.  Pt with new onset heart failure likely related to critical illness.   2/7 IR - R MCA embolization and revascularization 2/8 TEE - Left EF ~45% 2/9 CXR - new RLL infiltrate vs atelectasis 2/11 just extubated  Medications reviewed and include: lasix Labs reviewed: PO4: 1.3 (L)   Diet Order:   Diet Order            Diet NPO time specified  Diet effective now              EDUCATION NEEDS:   No education needs have been identified at this time  Skin:  Skin Assessment: Reviewed RN Assessment  Last BM:  PTA  Height:   Ht Readings from Last 1 Encounters:  04/07/2019 '5\' 11"'  (1.803 m)    Weight:   Wt Readings from Last 1 Encounters:  03/24/2019 85.8 kg    Ideal Body Weight:  78.2 kg  BMI:  Body mass index is 26.38 kg/m.  Estimated Nutritional Needs:   Kcal:  1900-2100  Protein:  110-125 grams  Fluid:  2L/day   Lockie Pares., RD, LDN, CNSC See AMiON for contact information

## 2019-03-26 NOTE — Progress Notes (Signed)
SLP Cancellation Note  Patient Details Name: Jonathan Berger MRN: [id number] DOB: 1931-10-02   Cancelled treatment:       Reason Eval/Treat Not Completed: Medical issues which prohibited therapy. Pt remains intubated. Will defer speech/language eval for now. Please reorder SLP when ready.    Mahala Menghini., M.A. CCC-SLP Acute Rehabilitation Services Pager [phone] Office [phone]  03/26/2019, 7:56 AM

## 2019-03-26 NOTE — Progress Notes (Signed)
ANTICOAGULATION CONSULT NOTE  Pharmacy Consult for Heparin Indication: atrial fibrillation  No Known Allergies  Patient Measurements: Height: 5\' 11"  (180.3 cm) Weight: 189 lb 2.5 oz (85.8 kg) IBW/kg (Calculated) : 75.3 Heparin Dosing Weight: 85.8 kg  Vital Signs: Temp: 98 F (36.7 C) (02/11 0000) Temp Source: Axillary (02/11 0000) BP: 104/56 (02/11 0100) Pulse Rate: 69 (02/11 0100)  Labs: Recent Labs    03/23/19 0303 03/23/19 1619 03/23/19 1640 03/23/19 1640 03/24/19 0423 03/24/19 0950 03/24/19 1827 03/24/19 2230 03/25/19 0507 03/25/19 0805 03/25/19 1619 03/26/19 0218  HGB 13.6   < > 11.9*   < > 12.8*  --   --   --  12.3*  --   --   --   HCT 41.1   < > 35.0*  --  39.5  --   --   --  38.6*  --   --   --   PLT 159  --   --   --  130*  --   --   --  PLATELET CLUMPS NOTED ON SMEAR, UNABLE TO ESTIMATE  --   --   --   HEPARINUNFRC  --   --   --   --   --   --    < >   < >  --  0.22* 0.22* 0.18*  CREATININE 1.17  --   --   --  1.19  --   --   --  1.32*  --   --   --   TROPONINIHS  --   --   --   --   --  27*  --   --   --   --   --   --    < > = values in this interval not displayed.    Estimated Creatinine Clearance: 42.8 mL/min (A) (by C-G formula based on SCr of 1.32 mg/dL (H)).   Assessment: 86 y.o. male with CVA and Afib for heparin  Goal of Therapy:  Heparin level 0.3 - 0.5 units/ml Monitor platelets by anticoagulation protocol: Yes   Plan:  Increase Heparin  1800 units/hr Check heparin level in 8 hours.   88, PharmD, BCPS  03/26/2019, 2:58 AM

## 2019-03-26 NOTE — Progress Notes (Signed)
Wasted 265 mL fentanyl in sink w/ Medical laboratory scientific officer

## 2019-03-26 NOTE — Evaluation (Signed)
Occupational Therapy Evaluation Patient Details Name: Jonathan Berger MRN: [id number] DOB: [birth year] Today's Date: 03/26/2019    History of Present Illness 86 year old presenting 2/7 with slurred speech and dysphagia found to have acute right middle cerebral artery stroke s/p tPA and EVR R MCA and ICA with stent.   Clinical Impression   Pt admitted with above diagnoses, presenting with cognitive deficits, L sided deficits, and decreased ability to perform usual BADL. PLOF and hx taken per chart review from PT whom had opportunity to speak with wife. At time of eval, pt had been extubated earlier than morning, now on venturi mask at 15L with 55% FiO2. RR up to mid 20s with bed level activity. Pt is agitated and restless in bed so OOB activity deferred. Pt able to actively move BUEs with increased weakness on LUE. He was able to track to L side with max cueing, but not sustain. Pt able to follow commands with max redirection to pull up into long sitting with mod A. Given his current status, recommend SNF at d/c but will continue to monitor if medical status changes. Will continue to follow per POC listed below.    Follow Up Recommendations  SNF;Supervision/Assistance - 24 hour;Other (comment)(will continue to monitor as med status changes)    Equipment Recommendations  Other (comment)(TBD)    Recommendations for Other Services       Precautions / Restrictions Precautions Precautions: Fall Precaution Comments: venturi mask, restraints Restrictions Weight Bearing Restrictions: No      Mobility Bed Mobility               General bed mobility comments: pt able to pull up to long sitting in bed with max cues for hand placement and mod A at trunk  Transfers                 General transfer comment: deferred 2/2 agitation    Balance                                           ADL either performed or assessed with clinical judgement   ADL                                          General ADL Comments: Pt currently total A for all BADL at this time due to mental status. he is able to bring his hand to his mouth to begin to participate in grooming tasks but needs constant redirection to not pull on lines/O2     Vision   Additional Comments: Difficult to asses per cognitive status; Needing increased cues to track to the L side     Perception     Praxis      Pertinent Vitals/Pain Pain Assessment: Faces Faces Pain Scale: Hurts a little bit Pain Location: could not locate, had just been catheterized Pain Descriptors / Indicators: Moaning Pain Intervention(s): Limited activity within patient's tolerance;Monitored during session;Repositioned     Hand Dominance     Extremity/Trunk Assessment Upper Extremity Assessment Upper Extremity Assessment: Difficult to assess due to impaired cognition;RUE deficits/detail;LUE deficits/detail RUE Deficits / Details: appears to be [facility] LUE Deficits / Details: increased weakness   Lower Extremity Assessment Lower Extremity Assessment: Defer to PT evaluation       Communication  Cognition Arousal/Alertness: Lethargic;Suspect due to medications Behavior During Therapy: Restless;Agitated Overall Cognitive Status: Difficult to assess Area of Impairment: Orientation;Memory                 Orientation Level: Disoriented to;Situation;Time   Memory: Decreased short-term memory         General Comments: pt able to carry over recall of being in [facility] hospital after orienting offered from therapist.   General Comments       Exercises     Shoulder Instructions      Home Living Family/patient expects to be discharged to:: Private residence Living Arrangements: Spouse/significant other Available Help at Discharge: Family Type of Home: House Home Access: Stairs to enter Secretary/administrator of Steps: 3-4 Entrance Stairs-Rails: Right;Left Home Layout: Two  level;Bed/bath upstairs Alternate Level Stairs-Number of Steps: flight Alternate Level Stairs-Rails: Right Bathroom Shower/Tub: Tub/shower unit;Walk-in shower   Bathroom Toilet: Standard         Additional Comments: info taken from when PT evaluated and was able to speak with wife      Prior Functioning/Environment Level of Independence: Independent                 OT Problem List: Decreased strength;Impaired vision/perception;Decreased knowledge of use of DME or AE;Decreased coordination;Decreased knowledge of precautions;Decreased activity tolerance;Decreased cognition;Impaired UE functional use;Cardiopulmonary status limiting activity;Impaired balance (sitting and/or standing);Decreased safety awareness;Pain      OT Treatment/Interventions: Self-care/ADL training;Visual/perceptual remediation/compensation;Therapeutic exercise;Patient/family education;Neuromuscular education;Balance training;Energy conservation;Therapeutic activities;DME and/or AE instruction;Cognitive remediation/compensation    OT Goals(Current goals can be found in the care plan section) Acute Rehab OT Goals OT Goal Formulation: Patient unable to participate in goal setting Time For Goal Achievement: 04/09/19 Potential to Achieve Goals: Fair  OT Frequency: Min 2X/week   Barriers to D/C:            Co-evaluation              AM-PAC OT "6 Clicks" Daily Activity     Outcome Measure Help from another person eating meals?: Total Help from another person taking care of personal grooming?: Total Help from another person toileting, which includes using toliet, bedpan, or urinal?: Total Help from another person bathing (including washing, rinsing, drying)?: Total Help from another person to put on and taking off regular upper body clothing?: Total Help from another person to put on and taking off regular lower body clothing?: Total 6 Click Score: 6   End of Session Nurse Communication: Mobility  status  Activity Tolerance: Treatment limited secondary to agitation;Treatment limited secondary to medical complications (Comment);Other (comment)(respiratory status changes) Patient left: in bed;with call bell/phone within reach;with bed alarm set;with restraints reapplied  OT Visit Diagnosis: Other abnormalities of gait and mobility (R26.89);Muscle weakness (generalized) (M62.81);Other symptoms and signs involving cognitive function;Hemiplegia and hemiparesis Hemiplegia - Right/Left: Left Hemiplegia - caused by: Cerebral infarction                Time: 1017-1028 OT Time Calculation (min): 11 min Charges:  OT General Charges $OT Visit: 1 Visit OT Evaluation $OT Eval High Complexity: 1 High  Dalphine Handing, MSOT, OTR/L Acute Rehabilitation Services [facility] Office Number: [phone]  Dalphine Handing 03/26/2019, 1:34 PM

## 2019-03-26 NOTE — Progress Notes (Addendum)
STROKE TEAM PROGRESS NOTE   INTERVAL HISTORY RN and CCM PA are at bedside. Pt was just extubated, but O2 sat around 91-93% and tachycardia at 130s. He still has deep sputum and hard to cough out or suction out. He complains of "hard to breath". However, respiratory wise relatively stable, do not need re-intubation at this time. Will need close respiratory and cardiac monitoring, low threshold for re-intubation.     Vitals:   03/26/19 0700 03/26/19 0812 03/26/19 0822 03/26/19 0901  BP: 140/68 (!) 161/90  (!) 141/64  Pulse: 65 (!) 102  (!) 130  Resp: 18 18  16   Temp:   98.7 F (37.1 C)   TempSrc:   Axillary   SpO2: 97% 92%  91%  Weight:      Height:        CBC:  Recent Labs  Lab 2019-04-11 1303 April 11, 2019 1308 03/23/19 0303 03/23/19 1619 03/25/19 0507 03/26/19 0446  WBC 9.5   < > 14.3*   < > 9.1 7.0  NEUTROABS 5.0  --  10.3*  --   --   --   HGB 16.1   < > 13.6   < > 12.3* 11.1*  HCT 48.0   < > 41.1   < > 38.6* 34.0*  MCV 97.8   < > 99.3   < > 102.9* 102.4*  PLT 145*   < > 159   < > PLATELET CLUMPS NOTED ON SMEAR, UNABLE TO ESTIMATE PLATELET CLUMPS NOTED ON SMEAR, UNABLE TO ESTIMATE   < > = values in this interval not displayed.    Basic Metabolic Panel:  Recent Labs  Lab 03/25/19 0507 03/25/19 0507 03/25/19 1619 03/26/19 0446  NA 141  --   --  138  K 3.7  --   --  4.6  CL 114*  --   --  112*  CO2 21*  --   --  19*  GLUCOSE 190*  --   --  138*  BUN 17  --   --  18  CREATININE 1.32*  --   --  0.91  CALCIUM 8.5*  --   --  7.8*  MG 1.9   < > 1.8 1.9  PHOS 1.6*   < > 2.1* 1.3*   < > = values in this interval not displayed.   Lipid Panel:     Component Value Date/Time   CHOL 154 03/23/2019 0303   TRIG 109 03/24/2019 2100   HDL 36 (L) 03/23/2019 0303   CHOLHDL 4.3 03/23/2019 0303   VLDL 38 03/23/2019 0303   LDLCALC 80 03/23/2019 0303   HgbA1c:  Lab Results  Component Value Date   HGBA1C 5.9 (H) 03/23/2019   Urine Drug Screen: No results found for: LABOPIA,  COCAINSCRNUR, LABBENZ, AMPHETMU, THCU, LABBARB  Alcohol Level No results found for: ETH  IMAGING past 48 hours DG Chest Port 1 View  Result Date: 03/26/2019 CLINICAL DATA:  Hypoxia EXAM: PORTABLE CHEST 1 VIEW COMPARISON:  Two days ago FINDINGS: Endotracheal tube tip is halfway between the clavicular heads and carina. Interval right PICC with tip at the upper right atrium. The enteric tube at least reaches the stomach. Dense opacity at the bases with worsening aeration along the diaphragm which is no longer distinct. The hazy density, especially on the right may reflect dependent pleural fluid. No pneumothorax. Cardiomegaly. IMPRESSION: 1. Unremarkable hardware positioning. 2. Worsening opacification which is likely both pulmonary opacity and pleural fluid. Electronically Signed   By: 05/24/2019  Watts M.D.   On: 03/26/2019 07:47   Cerebral Angiogram 03/20/2019 S/P bilateral common carotid arteriograms followed by complete revascularization of RT MCA pre occlusive thrombus following balloon angioplasty with stent placement at prox RT ICA for severe pre occlusive stenosis ,and balloon angioplasty of heavily calcified plaque in proc RT ICAA with a patency  Of approx 80 % .  With TICI 3 revascularization    PHYSICAL EXAM  Temp:  [97.8 F (36.6 C)-98.7 F (37.1 C)] 98.7 F (37.1 C) (02/11 0822) Pulse Rate:  [36-162] 130 (02/11 0901) Resp:  [10-26] 16 (02/11 0901) BP: (65-183)/(40-107) 141/64 (02/11 0901) SpO2:  [91 %-100 %] 91 % (02/11 0901) FiO2 (%):  [40 %-60 %] 55 % (02/11 0901)  General - Well nourished, well developed, just extubated with mild anxiety.  Ophthalmologic - fundi not visualized due to noncooperation.  Cardiovascular - irregular heart beats with tachycardia, frequent PACs on tele.  Neuro - just extubated, but still SOB and not able to talk in full sentences, eyes open on voice and following simple commands, severe dysarthria. Not able to answer orientation questions at this  time. With eye opening, eyes in mid position, able to have bilateral gaze but with right gaze nystagmus, PERRL. Facial symmetric.  Tongue protrusion midline. LUE 3-/5 with drift and RUE 4/5 at least, BLEs symmetric 3/5 at least. Sensation, coordination not cooperative and gait not tested.   ASSESSMENT/PLAN Mr. ORREN PIETSCH is a 86 y.o. male with history of HTN presenting with slurred speech and L facial droop. Received tPA 04/10/2019 at 1324. Taken for IR for distal R M1 thrombus.   Stroke:   R MCA scattered infarcts s/p tPA and IR w/ R ICA stent placement, infarct secondary to large vessel disease source  CT head hyperdense R MCA.     CTA head & neck R MCA LVO mid and distal segment. Cervical R ICA critical stenosis w/ random string sign stenoses. L ICA distal bulb string sign, severe B VA origin stenoses   Cerebral angio TICI 3 revascularization R MCA preocclusive thrombus w/ angioplasty and stent at proximal R ICA and angioplasty of 80% plaque in L ICA.   MRI  R temporal lobe cortical/ subcortical infarct w/ numerous small scattered R brain infarcts. Proximal R M2 MCA w/ loss of signal likely residual thrombus.    2D Echo EF 45%, no SOE seen  LDL 80  HgbA1c 5.9  P2Y12 = 8  lovenox for VTE prophylaxis  No antithrombotic prior to admission, now on Brilinta (ticagrelor) 90 mg bid and heparin IV. ASA discontinued   Therapy recommendations:  pending   Disposition:  pending   Vent dependent respiratory failure RLL aspiration pneumonia  intubate for IR, left intubated   Extubated 2/11   Copious secretion and SOB post extubation but stable O2 sat  Close monitoring, low threshold for re-intubation  CXR RLL infiltrates -> Worsening opacification   On unasyn 2/9>>  Leukocytosis WBC 11.4->9.1->7.0  Fever 101.1->99.8->afebrile  CCM on board  Carotid stenosis  CTA neck - bilateral ICA bulb string sign  S/p right ICA stenting  S/p left ICA angioplasty  Consider  further left ICA intervention in the near future - Dr. Corliss Skains aware  PAF  Intermittently on tele  EKG showed junctional vs. afib  Currently tele showed frequent PACs and PVCs  On heparin IV  Close monitoring Hb and platelet  Tachycardia post extubation -> metoprolol IV once  Hx of Hypertension hypotension  Home meds:  Valsartan-HCTZ 160-12.5,  amlodipine 5  On levophed gtt . BP goal 110-180  . Wean off pressor as able . Long-term BP goal normotensive  Hyperlipidemia  Home meds:  No statin  LDL 80, goal < 70  On lipitor 40   Continue statin at discharge  Dysphagia . Secondary to stroke . NPO . Speech pending for swallow  Other Stroke Risk Factors  Advanced age  Former Cigarette smoker, quit 45 yrs ago  ETOH use, will advise to drink no more than 2 drink(s) a day   Other Active Problems  Dementia, on aricept and namenda  Urinary retention, foley placed  Thrombocytopenia - platelet 159->130->clump  Hospital day # 4  This patient is critically ill due to right MCA stroke, bilateral carotid stenosis, status post TPA, and IR, respiratory failure and at significant risk of neurological worsening, death form recurrent stroke, hemorrhagic conversion, seizure, heart failure. This patient's care requires constant monitoring of vital signs, hemodynamics, respiratory and cardiac monitoring, review of multiple databases, neurological assessment, discussion with family, other specialists and medical decision making of high complexity. I spent 40 minutes of neurocritical care time in the care of this patient.  Discussed with CCM PA Eddie Dibbles.   Rosalin Hawking, MD PhD Stroke Neurology 03/26/2019 9:22 AM  To contact Stroke Continuity provider, please refer to [email or web address] After hours, contact General Neurology

## 2019-03-26 NOTE — Progress Notes (Signed)
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   OT comments  Pt seen for additional session due to improved status change and RN wanting to assist pt to chair. Pt much more alert, not restless, and able to participate in therapeutic session. Pt completed bed mobility at min A. Once sitting EOB pt needing safety cues to not impulsively engage in transfer/reach for lines. Noted pt BUEs to display dysmetria (L>R) with finger to nose. LUE is also much weaker than RUE upon examination. Attempted sit <> stand with heavy mod A +2, pt having difficulty coordinating for safe transfer. Stedy then used with min A +2, able to stand throughout time in stedy to go to chair. Pt also presents with cognitive deficits as described below, responded best to short simple commands. Updated recs to CIR due to increased engagement in session and ability to tolerate intensity. Pt was independent prior to this admission per wife. Will continue to follow.   Follow Up Recommendations  CIR;Supervision/Assistance - 24 hour    Equipment Recommendations  Other (comment)(TBD)    Recommendations for Other Services      Precautions / Restrictions Precautions Precautions: Fall Restrictions Weight Bearing Restrictions: No       Mobility Bed Mobility Overal bed mobility: Needs Assistance Bed Mobility: Supine to Sit     Supine to sit: Min assist     General bed mobility comments: pt initiating BLEs, needing assist on L side, used bed rail to pull up  Transfers Overall transfer level: Needs assistance Equipment used: Ambulation equipment used Transfers: Sit to/from Stand Sit to Stand: Min assist;+2 physical assistance;+2 safety/equipment          General transfer comment: attempted first without stedy at heavy mod A +2, pt with difficulty coordinating and showing dysmetria making further transfer unsafe. Improved to min A +2 with stedy, pt able to tolerate standing throughout entire time in stedy    Balance Overall balance assessment: Needs assistance Sitting-balance support: Bilateral upper extremity supported;Feet supported Sitting balance-Leahy Scale: Fair     Standing balance support: Bilateral upper extremity supported;During functional activity Standing balance-Leahy Scale: Zero Standing balance comment: reliant on therapist or amb equipment                           ADL either performed or assessed with clinical judgement   ADL Overall ADL's : Needs assistance/impaired     Grooming: Sitting;Minimal assistance;Cueing for safety;Cueing for sequencing   Upper Body Bathing: Moderate assistance;Sitting;Cueing for safety;Cueing for sequencing   Lower Body Bathing: Maximal assistance;Sit to/from stand;Sitting/lateral leans;Cueing for safety;Cueing for sequencing   Upper Body Dressing : Moderate assistance;Sitting;Cueing for safety;Cueing for sequencing   Lower Body Dressing: Maximal assistance;Total assistance;Sitting/lateral leans;Sit to/from stand;Cueing for safety;Cueing for sequencing Lower Body Dressing Details (indicate cue type and reason): attempted to don socks sitting EOB, unable to maintain balance and have BUE coordinationand strength to manipulate sock Toilet Transfer: Minimal assistance;+2 for physical assistance;+2 for safety/equipment;RW;Cueing for safety;Cueing for sequencing Toilet Transfer Details (indicate cue type and reason): with use of stedy; attempted to sit <> stand with heavy mod A +2 Toileting- Clothing Manipulation and Hygiene: Maximal assistance;Sit to/from stand       Functional mobility during  ADLs: Minimal assistance;+2 for physical assistance;+2 for safety/equipment(use of  stedy) General ADL Comments: pt made turn around this session, able to attend to activities and participate with therapy after getting getting lasix and fluid off. Pt able to engage in functional transfers     Vision   Additional Comments: continue to assess   Perception     Praxis      Cognition Arousal/Alertness: Awake/alert Behavior During Therapy: WFL for tasks assessed/performed Overall Cognitive Status: Impaired/Different from baseline Area of Impairment: Orientation;Memory;Following commands;Safety/judgement;Problem solving                 Orientation Level: Disoriented to;Situation;Time   Memory: Decreased short-term memory Following Commands: Follows one step commands inconsistently Safety/Judgement: Decreased awareness of deficits;Decreased awareness of safety   Problem Solving: Slow processing;Decreased initiation;Difficulty sequencing;Requires verbal cues;Requires tactile cues General Comments: pt needing repitition and max cues to maintain impulsivity; overall slower processing speed and needing short easily redirectable cues to engage in session        Exercises     Shoulder Instructions       General Comments      Pertinent Vitals/ Pain       Pain Assessment: No/denies pain  Home Living                                          Prior Functioning/Environment              Frequency  Min 2X/week        Progress Toward Goals  OT Goals(current goals can now be found in the care plan section)  Progress towards OT goals: Progressing toward goals  Acute Rehab OT Goals Patient Stated Goal: get to chair OT Goal Formulation: With patient Time For Goal Achievement: 04/09/19 Potential to Achieve Goals: Good ADL Goals Pt Will Transfer to Toilet: with mod assist;squat pivot transfer;bedside commode Additional ADL Goal #1: Pt will complete bed mobility at min A in preparation for BADL Additional ADL Goal #2: Pt will follow  75% of one step commands to successfully complete BADL Additional ADL Goal #3: Pt will accurately identify 4/5 BADL items and apply them to BADL usage  Plan Discharge plan needs to be updated    Co-evaluation                 AM-PAC OT "6 Clicks" Daily Activity     Outcome Measure   Help from another person eating meals?: Total(NPO) Help from another person taking care of personal grooming?: A Little Help from another person toileting, which includes using toliet, bedpan, or urinal?: A Lot Help from another person bathing (including washing, rinsing, drying)?: A Lot Help from another person to put on and taking off regular upper body clothing?: A Lot Help from another person to put on and taking off regular lower body clothing?: A Lot 6 Click Score: 12    End of Session Equipment Utilized During Treatment: Gait belt  OT Visit Diagnosis: Other abnormalities of gait and mobility (R26.89);Muscle weakness (generalized) (M62.81);Other symptoms and signs involving cognitive function;Hemiplegia and hemiparesis Hemiplegia - Right/Left: Left Hemiplegia - caused by: Cerebral infarction   Activity Tolerance Patient tolerated treatment well   Patient Left in chair;with call bell/phone within reach;with chair alarm set   Nurse Communication Mobility status        Time: 0174-9449 OT Time Calculation (min): 25 min  Charges:  OT General Charges $OT Visit: 1 Visit OT Treatments $Self Care/Home Management : 23-37 mins  Dalphine Handing, MSOT, OTR/L Acute Rehabilitation Services [facility] Office Number: [phone]  Dalphine Handing 03/26/2019, 5:42 PM

## 2019-03-26 NOTE — Procedures (Signed)
Extubation Procedure Note  Patient Details:   Name: Jonathan Berger DOB: 1931/03/29 MRN: [id number]   Airway Documentation:    Vent end date: 03/26/19 Vent end time: 0855   Evaluation  O2 sats: currently acceptable Complications: No apparent complications Patient did tolerate procedure well. Bilateral Breath Sounds: Diminished   Yes   Patient extubated per MD order.  Positive cuff leak noted.  Patient originally placed on 6L nasal cannula however sats noted to be 88% and patient noted to be mouth breathing.  Placed on 55% venturi mask and sats are currently at 95%.  MD present in room during extubation and is aware.  Patient's HR also tachy however MD ordered medication for HR.  Will leave on venturi mask and wean as tolerated.    Elyn Peers 03/26/2019, 9:02 AM

## 2019-03-26 NOTE — Progress Notes (Signed)
Patient is NPO and had 3 PO meds ordered for 2200 (Brilinta, Donepezil and Lopressor). MD was paged and an order for an NG tube was given. This RN and 2 other RNs attempted to place an NGT multiple times. The patient had a nose bleed and was coughing up blood. Attempted another bedside swallow and patient failed. MD A[place]ora paged again regarding failure to place NGT. No response. Brilinta held due to no route to give med.

## 2019-03-26 NOTE — Progress Notes (Addendum)
Progress Note  Patient Name: Jonathan Berger Date of Encounter: 03/26/2019  Primary Cardiologist: No primary care provider on file.   Subjective   Patient awake, but still intubated. Trying to pull at tube.   Inpatient Medications    Scheduled Meds: . atorvastatin  40 mg Per Tube q1800  . chlorhexidine gluconate (MEDLINE KIT)  15 mL Mouth Rinse BID  . Chlorhexidine Gluconate Cloth  6 each Topical Daily  . donepezil  5 mg Per Tube QHS  . feeding supplement (PRO-STAT SUGAR FREE 64)  30 mL Per Tube Daily  . labetalol  20 mg Intravenous Once  . mouth rinse  15 mL Mouth Rinse 10 times per day  . pantoprazole (PROTONIX) IV  40 mg Intravenous QHS  . sodium chloride flush  10-40 mL Intracatheter Q12H  . sodium chloride flush  3 mL Intravenous Once  . ticagrelor  90 mg Oral BID   Or  . ticagrelor  90 mg Per Tube BID   Continuous Infusions: . sodium chloride    . sodium chloride    . ampicillin-sulbactam (UNASYN) IV Stopped (03/26/19 0507)  . clevidipine    . feeding supplement (VITAL AF 1.2 CAL) 1,000 mL (03/25/19 1050)  . fentaNYL infusion INTRAVENOUS 75 mcg/hr (03/26/19 0600)  . heparin 1,800 Units/hr (03/26/19 0600)  . norepinephrine (LEVOPHED) Adult infusion 12 mcg/min (03/26/19 0600)  . propofol (DIPRIVAN) infusion 30 mcg/kg/min (03/26/19 0600)   PRN Meds: acetaminophen **OR** acetaminophen (TYLENOL) oral liquid 160 mg/5 mL **OR** acetaminophen, bisacodyl, fentaNYL (SUBLIMAZE) injection, fentaNYL (SUBLIMAZE) injection, senna-docusate, sodium chloride flush   Vital Signs    Vitals:   03/26/19 0615 03/26/19 0630 03/26/19 0645 03/26/19 0700  BP: (!) 121/58 104/69 133/83 140/68  Pulse: (!) 36 (!) 41 (!) 162 65  Resp: _0 Temp:      TempSrc:      SpO2: 97% 98% 96% 97%  Weight:      Height:        Intake/Output Summary (Last 24 hours) at 03/26/2019 0811 Last data filed at 03/26/2019 0600 Gross per 24 hour  Intake 3738.72 ml  Output 1350 ml  Net 2388.72 ml    Last 3 Weights 04/03/2019 05/07/2011 04/30/2011  Weight (lbs) 189 lb 2.5 oz 169 lb 3.2 oz 169 lb 3.2 oz  Weight (kg) 85.8 kg 76.749 kg 76.749 kg      Telemetry    Sinus tach, frequent PACs, PVCs - Personally Reviewed  ECG    No new tracings - Personally Reviewed  Physical Exam   GEN: intubated, awake, constantly moving and pulling at tube  Neck: No JVD Cardiac: tachycardic, irregular. no rubs or gallops.  Respiratory: Diffuse rhonchi; thick secretions  GI: Soft, non-distended  MS: No edema; No deformity. Neuro:  awake, alert, follows simple commands  Psych: Normal affect   Labs    High Sensitivity Troponin:   Recent Labs  Lab 03/24/19 0950  TROPONINIHS 27*      Chemistry Recent Labs  Lab 03/21/2019 1303 03/18/2019 1308 03/24/19 0423 03/25/19 0507 03/26/19 0446  NA 136   < > 140 141 138  K 3.6   < > 3.9 3.7 4.6  CL 102   < > 111 114* 112*  CO2 25   < > 21* 21* 19*  GLUCOSE 160*   < > 145* 190* 138*  BUN 22   < > _1 CREATININE 1.36*   < > 1.19 1.32* 0.91  CALCIUM 10.3   < >  8.4* 8.5* 7.8*  PROT 6.6  --   --   --   --   ALBUMIN 3.5  --  2.5* 2.2*  --   AST 19  --   --   --   --   ALT 15  --   --   --   --   ALKPHOS 54  --   --   --   --   BILITOT 0.8  --   --   --   --   GFRNONAA 47*   < > 55* 49* >60  GFRAA 54*   < > >60 56* >60  ANIONGAP 9   < > 8 6 7   < > = values in this interval not displayed.     Hematology Recent Labs  Lab 03/24/19 0423 03/25/19 0507 03/26/19 0446  WBC 11.4* 9.1 7.0  RBC 3.87* 3.75* 3.32*  HGB 12.8* 12.3* 11.1*  HCT 39.5 38.6* 34.0*  MCV 102.1* 102.9* 102.4*  MCH 33.1 32.8 33.4  MCHC 32.4 31.9 32.6  RDW 13.6 13.9 14.1  PLT 130* PLATELET CLUMPS NOTED ON SMEAR, UNABLE TO ESTIMATE PLATELET CLUMPS NOTED ON SMEAR, UNABLE TO ESTIMATE    BNPNo results for input(s): BNP, PROBNP in the last 168 hours.   DDimer No results for input(s): DDIMER in the last 168 hours.   Radiology    DG Chest Port 1 View  Result Date:  03/26/2019 CLINICAL DATA:  Hypoxia EXAM: PORTABLE CHEST 1 VIEW COMPARISON:  Two days ago FINDINGS: Endotracheal tube tip is halfway between the clavicular heads and carina. Interval right PICC with tip at the upper right atrium. The enteric tube at least reaches the stomach. Dense opacity at the bases with worsening aeration along the diaphragm which is no longer distinct. The hazy density, especially on the right may reflect dependent pleural fluid. No pneumothorax. Cardiomegaly. IMPRESSION: 1. Unremarkable hardware positioning. 2. Worsening opacification which is likely both pulmonary opacity and pleural fluid. Electronically Signed   By: Jonathon  Watts M.D.   On: 03/26/2019 07:47   US EKG SITE RITE  Result Date: 03/24/2019 If Site Rite image not attached, placement could not be confirmed due to current cardiac rhythm.   Cardiac Studies   Echo 03/23/19 1. Left ventricular ejection fraction, by estimation, is 45%. The left  ventricle has mildly decreased function. The left ventrical demonstrates  regional wall motion abnormalities (see scoring diagram/findings for  description). Akinesis of the mid to  apical anteroseptal and inferoseptal walls. Left ventricular diastolic  parameters are consistent with Grade I diastolic dysfunction (impaired  relaxation).  2. Right ventricular systolic function is mildly reduced. The right  ventricular size is mildly enlarged. There is mildly elevated pulmonary  artery systolic pressure. The estimated right ventricular systolic  pressure is 33.8 mmHg.  3. The aortic valve is tricuspid. Trivial aortic insufficiency, no aortic  stenosis.  4. No significant mitral regurgitation.  5. Dilated IVC with estimated RA pressure 15 mmHg.   Patient Profile     86 y.o. male with history of tobacco use, HTN who presented with right MCA CVA s/p revascularization and balloon angioplasty with stent placement to proximal right ICA. Found to have systolic dysfunction  and sinus arrhythmia.   Assessment & Plan    HFrEF -newly found on echo  -likely related to critical illness; would repeat echo as an outpatient once he recovers -no evidence of volume overload -initiate beta blocker when off pressors and BP tolerates  Sinus with   frequent PACs -sinus tach in the setting of agitation and extubation which improved after dose of IV metop  -no evidence of afib on telemetry -beta blocker should decrease ectopy once able to initiate as above     For questions or updates, please contact [facility] Please consult [email or web address] for contact info under        Signed, Delice Bison, DO  03/26/2019, 8:11 AM    Patient seen and examined.  Agree with above documentation.  Patient was extubated this morning.  On exam, patient is agitated,in restraints, orientedx2, tachycardic, distant heart sounds, diffuse rhonchi, no edema.  Review of telemetry shows sinus tachycardia with PACs, rates up to 130s.  Also has had intermittent NSVT.  No evidence of atrial fibrillation.  He is now extubated, off pressors.  Would start on metoprolol for his systolic dysfunction and frequent ectopy.  Donato Heinz, MD

## 2019-03-26 NOTE — Progress Notes (Signed)
PT Cancellation Note  Patient Details Name: Jonathan Berger MRN: [id number] DOB: 10-18-1931   Cancelled Treatment:    Reason Eval/Treat Not Completed: Medical issues which prohibited therapy; OT already in the room attempting to re-position pt who is on venturi mask and trying to move it off his face with facial movements despite mitts on his hands and constant cues.  HR 127 and SpO2 100%. Felt pt too agitated and confused for EOB or OOB so tx deferred. Will check back another day.   [name] 03/26/2019, 1:07 PM  Sheran Lawless, PT Acute Rehabilitation Services [phone] 03/26/2019

## 2019-03-26 NOTE — Progress Notes (Addendum)
PULMONARY / CRITICAL CARE MEDICINE   NAME:  Jonathan Berger, MRN:  [id number], DOB:  08/25/1931, LOS: 4 ADMISSION DATE:  04/04/2019, CONSULTATION DATE: 04/10/2019 REFERRING MD: Interventional radiology neurology, CHIEF COMPLAINT: Vent dependent respiratory failure secondary to procedure  BRIEF HISTORY:    86 year old presenting 2/7 with slurred speech and dysphagia found to have acute right middle cerebral artery stroke s/p tPA and EVR who returns to ICU on mechanical ventilation overnight.   SIGNIFICANT PAST MEDICAL HISTORY   Hypertension  SIGNIFICANT EVENTS:  03/24/2019 right MCA stroke  STUDIES:   2/7 CTH >>  Hyperdense Right MCA suspicious for emergent large vessel occlusion.  No acute intracranial hemorrhage. 2/7 CTA head/ neck>> Positive for Right MCA Emergent Large Vessel Occlusion affecting the mid and distal M1 segment. Critical Stenosis cervical Right ICA beginning at the origin, throughout the proximal 3.5 cm of the vessel. There are tandem RADIOGRAPHIC STRING SIGN STENOSES. Additional severe atherosclerosis and multifocal high-grade arterial stenoses elsewhere: - Left ICA distal bulb RADIOGRAPHIC STRING SIGN STENOSIS. - Severe bilateral Vertebral Artery origin stenoses. - proximal Left Subclavian Artery moderate stenosis due to plaque and tortuosity. 2/8 TTE >>  Left ventricular ejection fraction, by estimation, is 45%. The left ventricle has mildly decreased function. The left ventrical demonstrates regional wall motion abnormalities (see scoring diagram/findings for description). Akinesis of the mid to apical anteroseptal and inferoseptal walls. Left ventricular diastolic parameters are consistent with Grade I diastolic dysfunction (impaired relaxation). 2. Right ventricular systolic function is mildly reduced. 2/8 MRI Brain >> 3.1 cm acute cortical/subcortical infarct within the anterolateral right temporal lobe. Numerous additional small scattered acute cortical and subcortical infarct  within the right cerebral hemisphere MCA vascular territory.  CULTURES:  2/7 SARS2/ Flu A/B >> neg 2/8 urine > negative 2/8 resp > normal flora  ANTIBIOTICS:  2/7 cefazolin pre op 2/9 unasyn >>  LINES/TUBES:  03/18/2019 endotracheal tube >> 03/23/2019 left radial A-line >>2/9 04/11/2019 right femoral sheath >>2/9  CONSULTANTS:  04/08/2019 pulmonary critical care   SUBJECTIVE/INTERVAL:  Well sedated overnight. Following simple commands this morning and tolerating SBT                      CONSTITUTIONAL: BP 140/68   Pulse 65   Temp 97.8 F (36.6 C) (Axillary)   Resp 18   Ht 5\' 11"  (1.803 m)   Wt 85.8 kg   SpO2 97%   BMI 26.38 kg/m   I/O last 3 completed shifts: In: 5746.4 [I.V.:2958.2; NG/GT:1768.3; IV Piggyback:1019.9] Out: 2000 [Urine:2000]     Vent Mode: PRVC FiO2 (%):  [50 %-60 %] 50 % Set Rate:  [18 bmp] 18 bmp Vt Set:  [600 mL] 600 mL PEEP:  [5 cmH20] 5 cmH20 Plateau Pressure:  [18 cmH20-23 cmH20] 19 cmH20  PHYSICAL EXAM:  General:  Elderly male on vent HEENT: [place]/AT, PERRL, no JVD Neuro: awake, alert, follows simple commands.  CV: Tachycardic, regular.   PULM:  Non labored on MV, diffuse rhonchi. Secretions somewhat improved this morning.  GI: soft, bs+, foley Extremities: warm/dry. No acute deformity Skin: Grossly intact.  RESOLVED PROBLEM LIST   ASSESSMENT AND PLAN    Acute hypoxemic respiratory failure: concern he has aspirated. RLL infiltrate. He is also 6L positive for admission.  P:  Continue full MV support SBT this AM Wean FiO2 for sat goal > 90% VAP bundle Unasyn for a 7 day course Propofol/fentanyl for RASS goal 0 to -1.  Give lasix 40, retaining urine so will need foley replaced.   R MCA infarct s/p tPA and IR with L ICA stent placement. In and out of AF vs ST with PAC.  P:  Per Neurology  SBP goal per Neurology > 110; <180 Serial neuro exams Continue  soft collar per Neuro IR ASA/ Brillinta  Heparin infusion continue for now  Hypotension - possibly due to sedation vs related to ICA stent with carotid sinus being affected vs ?developing sepsis vs cardiogenic component given EF 45% (unclear if this new, no prior for comparison) Hx HTN  P:  Tele monitoring  SBP goal per Neuro >110; < 180 Following cultures  Trending renal function  Cardiology consulting On Levo for BP goals  New HFrEF SR with PACs - cardiology has evaluated and recommends beta blocker when able - repeat Echo as outpatient.   Hypokalemia - better today but goal > 4, Mag > 2 P:  No repletion necessary today. Trend BMP/ Mag   Leukocytosis - stable, PCT up. Improved - UA not overwhelming  P:  Follow cultures (UC/ trach asp) unasyn Trend fever curve/ WBC   Best Practice / Goals of Care / Disposition.   DVT PROPHYLAXIS: SCDs only/ adding heparin SQ if ok with neuro  (24hr post tPA) SUP: PPI NUTRITION: NPO, starting TF today  MOBILITY: Bedrest GOALS OF CARE: Full code FAMILY DISCUSSIONS:  Spoke with wife. Will update after decision made about extubation. DISPOSITION: ICU   LABS  Glucose Recent Labs  Lab 03/25/19 0832 03/25/19 1241 03/25/19 1600 03/25/19 2014 03/25/19 2334 03/26/19 0342  GLUCAP 164* 165* 177* 146* 140* 130*    BMET Recent Labs  Lab 03/24/19 0423 03/25/19 0507 03/26/19 0446  NA 140 141 138  K 3.9 3.7 4.6  CL 111 114* 112*  CO2 21* 21* 19*  BUN 13 17 18   CREATININE 1.19 1.32* 0.91  GLUCOSE 145* 190* 138*    Liver Enzymes Recent Labs  Lab 04/07/19 1303 03/24/19 0423 03/25/19 0507  AST 19  --   --   ALT 15  --   --   ALKPHOS 54  --   --   BILITOT 0.8  --   --   ALBUMIN 3.5 2.5* 2.2*    Electrolytes Recent Labs  Lab 03/24/19 0423 03/24/19 1827 03/25/19 0507 03/25/19 1619 03/26/19 0446  CALCIUM 8.4*  --  8.5*  --  7.8*  MG 2.0   < > 1.9 1.8 1.9  PHOS 1.9*   < > 1.6* 2.1* 1.3*   < > = values in this  interval not displayed.    CBC Recent Labs  Lab 03/24/19 0423 03/25/19 0507 03/26/19 0446  WBC 11.4* 9.1 7.0  HGB 12.8* 12.3* 11.1*  HCT 39.5 38.6* 34.0*  PLT 130* PLATELET CLUMPS NOTED ON SMEAR, UNABLE TO ESTIMATE PLATELET CLUMPS NOTED ON SMEAR, UNABLE TO ESTIMATE    ABG Recent Labs  Lab 03/23/19 1640  PHART 7.364  PCO2ART 33.0  PO2ART 72.0*    Coag's Recent Labs  Lab Apr 07, 2019 1303  APTT 28  INR 0.9    Critical care time 40 mins  Joneen Roach, AGACNP-BC [facility]  See Amion for personal pager PCCM on call pager [phone]  03/26/2019 7:49 AM

## 2019-03-27 ENCOUNTER — Encounter (HOSPITAL_COMMUNITY): Payer: Medicare Other

## 2019-03-27 DIAGNOSIS — J9621 Acute and chronic respiratory failure with hypoxia: Secondary | ICD-10-CM

## 2019-03-27 DIAGNOSIS — I519 Heart disease, unspecified: Secondary | ICD-10-CM | POA: Diagnosis present

## 2019-03-27 LAB — BASIC METABOLIC PANEL
Anion gap: 12 (ref 5–15)
BUN: 17 mg/dL (ref 8–23)
CO2: 27 mmol/L (ref 22–32)
Calcium: 8.6 mg/dL — ABNORMAL LOW (ref 8.9–10.3)
Chloride: 106 mmol/L (ref 98–111)
Creatinine, Ser: 1.06 mg/dL (ref 0.61–1.24)
GFR calc Af Amer: >60 mL/min (ref >60)
GFR calc non Af Amer: >60 mL/min (ref >60)
Glucose, Bld: 120 mg/dL — ABNORMAL HIGH (ref 70–99)
Potassium: 2.9 mmol/L — ABNORMAL LOW (ref 3.5–5.1)
Sodium: 145 mmol/L (ref 135–145)

## 2019-03-27 LAB — CBC
HCT: 33.8 % — ABNORMAL LOW (ref 39.0–52.0)
Hemoglobin: 11.2 g/dL — ABNORMAL LOW (ref 13.0–17.0)
MCH: 32.8 pg (ref 26.0–34.0)
MCHC: 33.1 g/dL (ref 30.0–36.0)
MCV: 99.1 fL (ref 80.0–100.0)
Platelets: 120 10*3/uL — ABNORMAL LOW (ref 150–400)
RBC: 3.41 MIL/uL — ABNORMAL LOW (ref 4.22–5.81)
RDW: 13.8 % (ref 11.5–15.5)
WBC: 7.9 10*3/uL (ref 4.0–10.5)
nRBC: 0 % (ref 0.0–0.2)

## 2019-03-27 LAB — GLUCOSE, CAPILLARY
Glucose-Capillary: 163 mg/dL — ABNORMAL HIGH (ref 70–99)
Glucose-Capillary: 125 mg/dL — ABNORMAL HIGH (ref 70–99)

## 2019-03-27 LAB — MAGNESIUM: Magnesium: 1.9 mg/dL (ref 1.7–2.4)

## 2019-03-27 LAB — PHOSPHORUS: Phosphorus: 1.6 mg/dL — ABNORMAL LOW (ref 2.5–4.6)

## 2019-03-27 LAB — HEPARIN LEVEL (UNFRACTIONATED): Heparin Unfractionated: 0.32 IU/mL (ref 0.30–0.70)

## 2019-03-27 MED ORDER — POTASSIUM CHLORIDE 10 MEQ/50ML IV SOLN
10.0000 meq | INTRAVENOUS | Status: AC
  Administered 2019-03-27 (×6): 10 meq via INTRAVENOUS
  Filled 2019-03-27 (×7): qty 50

## 2019-03-27 MED ORDER — QUETIAPINE FUMARATE 25 MG PO TABS
12.5000 mg | ORAL_TABLET | Freq: Every day | ORAL | Status: DC
  Administered 2019-03-27: 12.5 mg
  Filled 2019-03-27: qty 1

## 2019-03-27 MED ORDER — VITAL AF 1.2 CAL PO LIQD
1000.0000 mL | ORAL | Status: DC
  Administered 2019-03-27 – 2019-03-28 (×3): 1000 mL
  Filled 2019-03-27 (×4): qty 1000

## 2019-03-27 MED ORDER — POTASSIUM PHOSPHATES 15 MMOLE/5ML IV SOLN
30.0000 mmol | Freq: Once | INTRAVENOUS | Status: AC
  Administered 2019-03-27: 11:00:00 30 mmol via INTRAVENOUS
  Filled 2019-03-27: qty 10

## 2019-03-27 MED ORDER — FUROSEMIDE 10 MG/ML IJ SOLN
40.0000 mg | Freq: Once | INTRAMUSCULAR | Status: AC
  Administered 2019-03-27: 11:00:00 40 mg via INTRAVENOUS
  Filled 2019-03-27: qty 4

## 2019-03-27 MED ORDER — ASPIRIN 81 MG PO CHEW
81.0000 mg | CHEWABLE_TABLET | Freq: Every day | ORAL | Status: DC
  Administered 2019-03-27 – 2019-04-17 (×22): 81 mg
  Filled 2019-03-27 (×22): qty 1

## 2019-03-27 MED ORDER — POTASSIUM CHLORIDE 10 MEQ/100ML IV SOLN: 10.0000 meq | INTRAVENOUS | Status: DC

## 2019-03-27 MED ORDER — ASPIRIN 300 MG RE SUPP
300.0000 mg | Freq: Every day | RECTAL | Status: DC
  Administered 2019-03-27: 300 mg via RECTAL
  Filled 2019-03-27 (×2): qty 1

## 2019-03-27 NOTE — Progress Notes (Signed)
Inpatient Rehab Admissions:  [facility] followed up from PM&R consult.  I met with pt and his wife at the bedside for rehabilitation assessment and to discuss goals and expectations of an inpatient rehab admission. Feel pt is a great candidate for CIR. Pt and his wife are interested in [place]. I have confirmed DC support. AC will begin insurance authorization process for possible admit. Will follow up once there has been a determination.   Signed: Raechel Ache, OTR/L  Rehab Admissions Coordinator  [phone] 03/27/2019 3:33 PM

## 2019-03-27 NOTE — Progress Notes (Signed)
Referring Physician(s): Code Stroke- Kerney Elbe  Supervising Physician: Luanne Bras  Patient Status:  [facility] - In-pt  Chief Complaint: None  Subjective:  Acute CVA s/p cerebral arteriogram s/p emergent stent placement of proximal right ICA severe pre-occlusive stenosis along with balloon angioplasty of proximal/mid right ICA achieving a TICI 3 revascularization Apr 08, 2019 by Dr. Estanislado Pandy. Patient awake and alert laying in bed. He follow simple commands. Speech intact. Moving all extremities. Right groin incision c/d/i.   Allergies: Patient has no known allergies.  Medications: Prior to Admission medications   Medication Sig Start Date End Date Taking? Authorizing Provider  amLODipine (NORVASC) 5 MG tablet Take 5 mg by mouth daily. 01/29/19  Yes [provider]  donepezil (ARICEPT) 5 MG tablet Take 5 mg by mouth at bedtime. 03/05/19  Yes [provider]  Ensure (ENSURE) Take 237 mLs by mouth 3 (three) times daily between meals.   Yes [provider]  memantine (NAMENDA) 5 MG tablet Take 5 mg by mouth 2 (two) times daily. 02/04/19  Yes [provider]  triamcinolone cream (KENALOG) 0.1 % Apply 1 application topically at bedtime. Apply to rash on back of right leg below knee   Yes [provider]  valsartan-hydrochlorothiazide (DIOVAN-HCT) 160-12.5 MG tablet Take 1 tablet by mouth daily. 01/29/19  Yes [provider]  amLODipine-valsartan (EXFORGE) 5-320 MG per tablet Take 1 tablet by mouth daily.    04/27/11  [provider]     Vital Signs: BP (!) 143/62   Pulse (!) 140   Temp 99.1 F (37.3 C) (Oral)   Resp (!) 22   Ht 5\' 11"  (1.803 m)   Wt 189 lb 2.5 oz (85.8 kg)   SpO2 99%   BMI 26.38 kg/m   Physical Exam Vitals and nursing note reviewed.  Constitutional:      General: He is not in acute distress.    Appearance: Normal appearance.  Pulmonary:     Effort: Pulmonary effort is normal. No  respiratory distress.  Skin:    General: Skin is warm and dry.     Comments: Right groin incision soft without active bleeding or hematoma.   Neurological:     Mental Status: He is alert.     Comments: Alert, awake, and oriented x3. Speech and comprehension intact. PERRL bilaterally. No facial asymmetry. Can spontaneously move all extremities. Distal pulses 1+ bilaterally.     Imaging: MR BRAIN WO CONTRAST  Result Date: 03/23/2019 CLINICAL DATA:  Stroke, follow-up. EXAM: MRI HEAD WITHOUT CONTRAST TECHNIQUE: Multiplanar, multiecho pulse sequences of the brain and surrounding structures were obtained without intravenous contrast. COMPARISON:  Noncontrast CT head, CT angiogram head/neck and CT perfusion 04-08-2019 FINDINGS: Brain: There is an acute cortical/subcortical infarct within the anterolateral right temporal lobe measuring 3.1 x 1.5 cm in transaxial dimensions (series 5, image 67). There are numerous additional small scattered cortical and subcortical infarcts within the right cerebral hemisphere MCA vascular territory involving the right frontal lobe, parietal lobe, occipital lobe and temporal lobe as well as right insula. There is no significant mass effect. No midline shift or extra-axial fluid collection. No chronic intracranial blood products. Moderate patchy T2/FLAIR hyperintensity within the cerebral white matter is nonspecific, but consistent with chronic small vessel ischemic disease. Moderate generalized parenchymal atrophy. Vascular: SWI signal loss in the region of proximal right M2 MCA branches (series 12, image 23) which may reflect residual thrombus. Skull and upper cervical spine: No focal marrow lesion. Incompletely assessed upper cervical spondylosis.  Sinuses/Orbits: Visualized orbits demonstrate no acute abnormality. Paranasal sinus mucosal thickening. Most notably there is moderate mucosal thickening within bilateral ethmoid air cells. Left sphenoid sinus air-fluid level.  Small bilateral mastoid effusions. IMPRESSION: 3.1 cm acute cortical/subcortical infarct within the anterolateral right temporal lobe. Numerous additional small scattered acute cortical and subcortical infarct within the right cerebral hemisphere MCA vascular territory. No significant mass effect or evidence of parenchymal hemorrhage. SWI signal loss in the region of proximal right M2 MCA branches which may reflect residual thrombus. Moderate generalized parenchymal atrophy and chronic small vessel ischemic disease. Paranasal sinus disease as described. Small bilateral mastoid effusions. Electronically Signed   By: Jackey Loge DO   On: 03/23/2019 13:43   DG Chest Port 1 View  Result Date: 03/26/2019 CLINICAL DATA:  Hypoxia EXAM: PORTABLE CHEST 1 VIEW COMPARISON:  Two days ago FINDINGS: Endotracheal tube tip is halfway between the clavicular heads and carina. Interval right PICC with tip at the upper right atrium. The enteric tube at least reaches the stomach. Dense opacity at the bases with worsening aeration along the diaphragm which is no longer distinct. The hazy density, especially on the right may reflect dependent pleural fluid. No pneumothorax. Cardiomegaly. IMPRESSION: 1. Unremarkable hardware positioning. 2. Worsening opacification which is likely both pulmonary opacity and pleural fluid. Electronically Signed   By: Marnee Spring M.D.   On: 03/26/2019 07:47   DG Chest Port 1 View  Result Date: 03/24/2019 CLINICAL DATA:  ET tube placed EXAM: PORTABLE CHEST 1 VIEW COMPARISON:  03/20/2019 FINDINGS: Endotracheal tube with the tip 3.6 cm above the carina. Nasogastric tube coursing below the diaphragm projecting over the stomach. Mild right basilar airspace disease. No pleural effusion or pneumothorax. Stable cardiomediastinal silhouette. No aggressive osseous lesion. IMPRESSION: ET tube with the tip 3.6 cm above the carina. Right lower lobe hazy airspace disease which may reflect atelectasis versus  pneumonia. Electronically Signed   By: Elige Ko   On: 03/24/2019 08:47   [place] EKG SITE RITE  Result Date: 03/24/2019 If Site Rite image not attached, placement could not be confirmed due to current cardiac rhythm.   Labs:  CBC: Recent Labs    03/24/19 0423 03/25/19 0507 03/26/19 0446 03/27/19 0457  WBC 11.4* 9.1 7.0 7.9  HGB 12.8* 12.3* 11.1* 11.2*  HCT 39.5 38.6* 34.0* 33.8*  PLT 130* PLATELET CLUMPS NOTED ON SMEAR, UNABLE TO ESTIMATE PLATELET CLUMPS NOTED ON SMEAR, UNABLE TO ESTIMATE 120*    COAGS: Recent Labs    03/31/2019 1303  INR 0.9  APTT 28    BMP: Recent Labs    03/25/19 0507 03/26/19 0446 03/26/19 1706 03/27/19 0457  NA 141 138 143 145  K 3.7 4.6 2.9* 2.9*  CL 114* 112* 107 106  CO2 21* 19* 25 27  GLUCOSE 190* 138* 141* 120*  BUN 17 18 16 17   CALCIUM 8.5* 7.8* 8.7* 8.6*  CREATININE 1.32* 0.91 1.17 1.06  GFRNONAA 49* >60 56* >60  GFRAA 56* >60 >60 >60    LIVER FUNCTION TESTS: Recent Labs    04/07/2019 1303 03/24/19 0423 03/25/19 0507  BILITOT 0.8  --   --   AST 19  --   --   ALT 15  --   --   ALKPHOS 54  --   --   PROT 6.6  --   --   ALBUMIN 3.5 2.5* 2.2*    Assessment and Plan:  Acute CVA s/p cerebral arteriogram s/p emergent stent placement of proximal right  ICA severe pre-occlusive stenosis along with balloon angioplasty of proximal/mid right ICA achieving a TICI 3 revascularization 04-02-19 by Dr. Corliss Skains. Patient's condition improving- follows commands, speech intact, moves all extremities. Right groin incision stable, distal pulses 1+ bilaterally. Recommend carotid US today to check patency of right ICA stent. Continue taking Brilinta 90 mg twice daily and Aspirin 81 mg once daily. Plan to follow-up with Dr. Corliss Skains in clinic 4 weeks after discharge (order placed to facilitate this). Further plans per neurology/CCM- appreciate and agree with management. Please call NIR with questions   Electronically Signed: Elwin Mocha,  PA-C 03/27/2019, 9:55 AM   I spent a total of 25 Minutes at the the patient's bedside AND on the patient's hospital floor or unit, greater than 50% of which was counseling/coordinating care for proximal right ICA pre-occlusive stenosis s/p revascularization.

## 2019-03-27 NOTE — Progress Notes (Signed)
STROKE TEAM PROGRESS NOTE   INTERVAL HISTORY Pt RN at bedside. Pt is doing well this am, awake alert, orientated to place and age but not to time. Dysarthria improved but still did not pass swallow, on cortrak now. No more agitation and cooperative with exam. Did not need re-intubation yesterday. Resume brilinta.   Vitals:   03/27/19 0800 03/27/19 0900 03/27/19 0904 03/27/19 1000  BP: 128/66 (!) 143/62  (!) 169/81  Pulse: 82 (!) 140  (!) 115  Resp: 15 (!) 22  20  Temp:      TempSrc:      SpO2: 98% 99% 99% 98%  Weight:      Height:        CBC:  Recent Labs  Lab 04/07/2019 1303 04/05/2019 1308 03/23/19 0303 03/23/19 1619 03/26/19 0446 03/27/19 0457  WBC 9.5   < > 14.3*   < > 7.0 7.9  NEUTROABS 5.0  --  10.3*  --   --   --   HGB 16.1   < > 13.6   < > 11.1* 11.2*  HCT 48.0   < > 41.1   < > 34.0* 33.8*  MCV 97.8   < > 99.3   < > 102.4* 99.1  PLT 145*   < > 159   < > PLATELET CLUMPS NOTED ON SMEAR, UNABLE TO ESTIMATE 120*   < > = values in this interval not displayed.    Basic Metabolic Panel:  Recent Labs  Lab 03/26/19 0446 03/26/19 0446 03/26/19 1706 03/27/19 0457  NA 138   < > 143 145  K 4.6   < > 2.9* 2.9*  CL 112*   < > 107 106  CO2 19*   < > 25 27  GLUCOSE 138*   < > 141* 120*  BUN 18   < > 16 17  CREATININE 0.91   < > 1.17 1.06  CALCIUM 7.8*   < > 8.7* 8.6*  MG 1.9  --   --  1.9  PHOS 1.3*  --   --  1.6*   < > = values in this interval not displayed.   Lipid Panel:     Component Value Date/Time   CHOL 154 03/23/2019 0303   TRIG 109 03/24/2019 2100   HDL 36 (L) 03/23/2019 0303   CHOLHDL 4.3 03/23/2019 0303   VLDL 38 03/23/2019 0303   LDLCALC 80 03/23/2019 0303   HgbA1c:  Lab Results  Component Value Date   HGBA1C 5.9 (H) 03/23/2019   Urine Drug Screen: No results found for: LABOPIA, COCAINSCRNUR, LABBENZ, AMPHETMU, THCU, LABBARB  Alcohol Level No results found for: ETH  IMAGING past 48 hours DG Chest Port 1 View  Result Date: 03/26/2019 CLINICAL  DATA:  Hypoxia EXAM: PORTABLE CHEST 1 VIEW COMPARISON:  Two days ago FINDINGS: Endotracheal tube tip is halfway between the clavicular heads and carina. Interval right PICC with tip at the upper right atrium. The enteric tube at least reaches the stomach. Dense opacity at the bases with worsening aeration along the diaphragm which is no longer distinct. The hazy density, especially on the right may reflect dependent pleural fluid. No pneumothorax. Cardiomegaly. IMPRESSION: 1. Unremarkable hardware positioning. 2. Worsening opacification which is likely both pulmonary opacity and pleural fluid. Electronically Signed   By: Marnee Spring M.D.   On: 03/26/2019 07:47   Cerebral Angiogram 04/10/2019 S/P bilateral common carotid arteriograms followed by complete revascularization of RT MCA pre occlusive thrombus following balloon angioplasty with stent  placement at prox RT ICA for severe pre occlusive stenosis ,and balloon angioplasty of heavily calcified plaque in proc RT ICAA with a patency  Of approx 80 % .  With TICI 3 revascularization    PHYSICAL EXAM   Temp:  [98.7 F (37.1 C)-100.3 F (37.9 C)] 99.1 F (37.3 C) (02/12 0400) Pulse Rate:  [48-140] 115 (02/12 1000) Resp:  [11-29] 20 (02/12 1000) BP: (83-169)/(43-94) 169/81 (02/12 1000) SpO2:  [91 %-100 %] 98 % (02/12 1000) FiO2 (%):  [55 %] 55 % (02/11 1113)  General - Well nourished, well developed, not in acute distress.  Ophthalmologic - fundi not visualized due to noncooperation.  Cardiovascular - irregular heart beats with tachycardia, frequent PACs on tele.  Neuro - awake alert, orientated to self age and place but not to time.  Pleasant cooperative with exam.  Still has moderate dysarthria but improved from yesterday, following all simple commands, no aphasia. Eyes open spontaneously, eyes in mid position, able to have bilateral gaze, PERRL, blinking to visual threat bilaterally. Facial symmetric.  Tongue protrusion midline. BUE 4+/5  and BLE4/5 at least. Sensation metrical, bilateral finger-to-nose intact and gait not tested.   ASSESSMENT/PLAN Jonathan Berger is a 86 y.o. male with history of HTN presenting with slurred speech and L facial droop. Received tPA 04/07/19 at 1324. Taken for IR for distal R M1 thrombus.   Stroke:   R MCA scattered infarcts s/p tPA and IR w/ R ICA stent placement, infarct secondary to large vessel disease source  CT head hyperdense R MCA.     CTA head & neck R MCA LVO mid and distal segment. Cervical R ICA critical stenosis w/ random string sign stenoses. L ICA distal bulb string sign, severe B VA origin stenoses   Cerebral angio TICI 3 revascularization R MCA preocclusive thrombus w/ angioplasty and stent at proximal R ICA and angioplasty of 80% plaque in L ICA.   MRI  R temporal lobe cortical/ subcortical infarct w/ numerous small scattered R brain infarcts. Proximal R M2 MCA w/ loss of signal likely residual thrombus.    2D Echo EF 45%, no SOE seen  Carotid doppler pending   LDL 80  HgbA1c 5.9  P2Y12 = 8  lovenox for VTE prophylaxis  No antithrombotic prior to admission, now on Brilinta (ticagrelor) 90 mg bid and ASA 81mg  daily.  Continue on discharge  Therapy recommendations:  CIR  Disposition:  pending   Vent dependent hypoxic respiratory failure RLL aspiration pneumonitis  intubate for IR, left intubated   Extubated 2/11   Copious secretion and SOB post extubation but stable O2 sat  Close monitoring, low threshold for re-intubation  CXR RLL infiltrates -> Worsening opacification   On unasyn 2/9>> (2/15)  Leukocytosis WBC 11.4->9.1->7.0->7.9   Fever 101.1->99.8->afebrile->100.3->afebrile   Sputum Cx nml flora  UCx neg  Chest PT  CCM signed off  Carotid stenosis  CTA neck - bilateral ICA bulb string sign  S/p right ICA stenting  S/p left ICA angioplasty  CUS pending  Consider further left ICA intervention in the near future - Dr. 11-22-1995  aware  New HFrEF, ? D/t critical illness SR w/ PACs  2D Echo EF 45%, no SOE seen  Treated w/ lasix  Added metoprolol 25mg  bid  Repeat 2D as OP  On ASA and brilinta  Cardiology on board  Hx of Hypertension Hypotension d/t sedation, resolved  Home meds:  Valsartan-HCTZ 160-12.5, amlodipine 5  Off levophed gtt  BP stable .  BP goal 110-180  . Long-term BP goal normotensive  Hyperlipidemia  Home meds:  No statin  LDL 80, goal < 70  On lipitor 40   Continue statin at discharge  Dysphagia . Secondary to stroke . NPO . TF via Cortrak . Speech pending for swallow  Other Stroke Risk Factors  Advanced age  Former Cigarette smoker, quit 45 yrs ago  ETOH use, will advise to drink no more than 2 drink(s) a day   Other Active Problems  Dementia, on aricept and namenda  Urinary retention, foley placed  Thrombocytopenia - platelet 159->130->clump->120  Hypokalemia 4.6-2.9-2.9 - supplement  Hypophosphatemia 2.1-1.3-1.6 - supplement    Hospital day # 5  This patient is critically ill due to right MCA stroke, bilateral carotid stenosis, status post TPA, and IR, respiratory failure and at significant risk of neurological worsening, death form recurrent stroke, hemorrhagic conversion, seizure, heart failure. This patient's care requires constant monitoring of vital signs, hemodynamics, respiratory and cardiac monitoring, review of multiple databases, neurological assessment, discussion with family, other specialists and medical decision making of high complexity. I spent 30 minutes of neurocritical care time in the care of this patient.    Rosalin Hawking, MD PhD Stroke Neurology 03/27/2019 11:13 AM  To contact Stroke Continuity provider, please refer to [email or web address] After hours, contact General Neurology

## 2019-03-27 NOTE — Progress Notes (Signed)
Pharmacy Antibiotic Note  Jonathan Berger is a 86 y.o. male admitted on 04/02/2019 with aspiration pneumonia.  Pharmacy has been consulted for Unasyn dosing - planning 7 days total. Patient extubated 2/11. SCr stable 1.06.  Plan: Continue Unasyn 1.5 gm IV q8hr Monitor renal function and C&S  Height: 5\' 11"  (180.3 cm) Weight: 189 lb 2.5 oz (85.8 kg) IBW/kg (Calculated) : 75.3  Temp (24hrs), Avg:99.3 F (37.4 C), Min:98.7 F (37.1 C), Max:100.3 F (37.9 C)  Recent Labs  Lab 03/23/19 0303 03/23/19 0303 03/24/19 0423 03/25/19 0507 03/26/19 0446 03/26/19 1706 03/27/19 0457  WBC 14.3*  --  11.4* 9.1 7.0  --  7.9  CREATININE 1.17   < > 1.19 1.32* 0.91 1.17 1.06   < > = values in this interval not displayed.    Estimated Creatinine Clearance: 53.3 mL/min (by C-G formula based on SCr of 1.06 mg/dL).    No Known Allergies  Antimicrobials this admission: Unasyn 2/9 >> (2/15)  Microbiology results: 2/8 Sputum: nf 2/8 MRSA PCR: negative 2/8 UC - neg   4/8, PharmD, BCPS Please check AMION for all [facility] contact numbers Clinical Pharmacist 03/27/2019 9:41 AM

## 2019-03-27 NOTE — Progress Notes (Addendum)
Progress Note  Patient Name: Jonathan Berger Date of Encounter: 03/27/2019  Primary Cardiologist: No primary care provider on file.   Subjective   Mr. Mooneyhan is doing much better today. Breathing has improved. He denies any chest pain.   Inpatient Medications    Scheduled Meds: . aspirin  300 mg Rectal Daily  . atorvastatin  40 mg Per Tube q1800  . chlorhexidine  15 mL Mouth Rinse BID  . Chlorhexidine Gluconate Cloth  6 each Topical Daily  . donepezil  5 mg Per Tube QHS  . labetalol  20 mg Intravenous Once  . mouth rinse  15 mL Mouth Rinse q12n4p  . metoprolol tartrate  25 mg Per Tube BID  . pantoprazole (PROTONIX) IV  40 mg Intravenous QHS  . sodium chloride flush  10-40 mL Intracatheter Q12H  . sodium chloride flush  3 mL Intravenous Once  . sodium chloride HYPERTONIC  4 mL Nebulization Daily  . ticagrelor  90 mg Oral BID   Or  . ticagrelor  90 mg Per Tube BID   Continuous Infusions: . sodium chloride    . sodium chloride    . ampicillin-sulbactam (UNASYN) IV Stopped (03/27/19 0533)  . clevidipine    . heparin 1,800 Units/hr (03/27/19 0800)  . norepinephrine (LEVOPHED) Adult infusion Stopped (03/26/19 0843)   PRN Meds: acetaminophen **OR** acetaminophen (TYLENOL) oral liquid 160 mg/5 mL **OR** acetaminophen, bisacodyl, metoprolol tartrate, senna-docusate, sodium chloride flush   Vital Signs    Vitals:   03/27/19 0500 03/27/19 0600 03/27/19 0700 03/27/19 0800  BP: 127/84 119/73 124/69 128/66  Pulse:    82  Resp: 18 13 18 15   Temp:      TempSrc:      SpO2:    98%  Weight:      Height:        Intake/Output Summary (Last 24 hours) at 03/27/2019 0809 Last data filed at 03/27/2019 0800 Gross per 24 hour  Intake 750.08 ml  Output 7625 ml  Net -6874.92 ml   Last 3 Weights March 24, 2019 05/07/2011 04/30/2011  Weight (lbs) 189 lb 2.5 oz 169 lb 3.2 oz 169 lb 3.2 oz  Weight (kg) 85.8 kg 76.749 kg 76.749 kg      Telemetry    Sinus with PACs, intermittent  tachycardia; heart rate improved compared to yesterday- Personally Reviewed  ECG    No new tracings - Personally Reviewed  Physical Exam   GEN: No acute distress.   Neck: No JVD Cardiac: RRR, no murmurs, rubs, or gallops.  Respiratory: no increased work of breathing; lung sounds coarse at bases but overall much improved compared to yesterday. GI: Soft, nontender, non-distended  MS: No edema; No deformity. Neuro:  Nonfocal  Psych: Normal affect   Labs    High Sensitivity Troponin:   Recent Labs  Lab 03/24/19 0950  TROPONINIHS 27*      Chemistry Recent Labs  Lab 03/24/19 1303 2019-03-24 1308 03/24/19 0423 03/24/19 0423 03/25/19 0507 03/25/19 0507 03/26/19 0446 03/26/19 1706 03/27/19 0457  NA 136   < > 140   < > 141   < > 138 143 145  K 3.6   < > 3.9   < > 3.7   < > 4.6 2.9* 2.9*  CL 102   < > 111   < > 114*   < > 112* 107 106  CO2 25   < > 21*   < > 21*   < > 19* 25 27  GLUCOSE 160*   < > 145*   < > 190*   < > 138* 141* 120*  BUN 22   < > 13   < > 17   < > 18 16 17   CREATININE 1.36*   < > 1.19   < > 1.32*   < > 0.91 1.17 1.06  CALCIUM 10.3   < > 8.4*   < > 8.5*   < > 7.8* 8.7* 8.6*  PROT 6.6  --   --   --   --   --   --   --   --   ALBUMIN 3.5  --  2.5*  --  2.2*  --   --   --   --   AST 19  --   --   --   --   --   --   --   --   ALT 15  --   --   --   --   --   --   --   --   ALKPHOS 54  --   --   --   --   --   --   --   --   BILITOT 0.8  --   --   --   --   --   --   --   --   GFRNONAA 47*   < > 55*   < > 49*   < > >60 56* >60  GFRAA 54*   < > >60   < > 56*   < > >60 >60 >60  ANIONGAP 9   < > 8   < > 6   < > 7 11 12    < > = values in this interval not displayed.     Hematology Recent Labs  Lab 03/25/19 0507 03/26/19 0446 03/27/19 0457  WBC 9.1 7.0 7.9  RBC 3.75* 3.32* 3.41*  HGB 12.3* 11.1* 11.2*  HCT 38.6* 34.0* 33.8*  MCV 102.9* 102.4* 99.1  MCH 32.8 33.4 32.8  MCHC 31.9 32.6 33.1  RDW 13.9 14.1 13.8  PLT PLATELET CLUMPS NOTED ON SMEAR, UNABLE  TO ESTIMATE PLATELET CLUMPS NOTED ON SMEAR, UNABLE TO ESTIMATE 120*    BNPNo results for input(s): BNP, PROBNP in the last 168 hours.   DDimer No results for input(s): DDIMER in the last 168 hours.   Radiology    DG Chest Port 1 View  Result Date: 03/26/2019 CLINICAL DATA:  Hypoxia EXAM: PORTABLE CHEST 1 VIEW COMPARISON:  Two days ago FINDINGS: Endotracheal tube tip is halfway between the clavicular heads and carina. Interval right PICC with tip at the upper right atrium. The enteric tube at least reaches the stomach. Dense opacity at the bases with worsening aeration along the diaphragm which is no longer distinct. The hazy density, especially on the right may reflect dependent pleural fluid. No pneumothorax. Cardiomegaly. IMPRESSION: 1. Unremarkable hardware positioning. 2. Worsening opacification which is likely both pulmonary opacity and pleural fluid. Electronically Signed   By: 05/25/19 M.D.   On: 03/26/2019 07:47    Cardiac Studies   Echo 03/23/19 1. Left ventricular ejection fraction, by estimation, is 45%. The left  ventricle has mildly decreased function. The left ventrical demonstrates  regional wall motion abnormalities (see scoring diagram/findings for  description). Akinesis of the mid to  apical anteroseptal and inferoseptal walls. Left ventricular diastolic  parameters are consistent with Grade I diastolic dysfunction (impaired  relaxation).  2.  Right ventricular systolic function is mildly reduced. The right  ventricular size is mildly enlarged. There is mildly elevated pulmonary  artery systolic pressure. The estimated right ventricular systolic  pressure is 67.2 mmHg.  3. The aortic valve is tricuspid. Trivial aortic insufficiency, no aortic  stenosis.  4. No significant mitral regurgitation.  5. Dilated IVC with estimated RA pressure 15 mmHg.  Patient Profile     86 y.o. male with history of tobacco use, HTN who presented with right MCA CVA s/p  revascularization and balloon angioplasty with stent placement to proximal right ICA. Found to have systolic dysfunction and sinus arrhythmia.   Assessment & Plan    Acute respiratory failure with hypoxia Aspiration pneumonitis -extubated to nasal cannula 2/11; weaned to 5L today -on unasyn for aspiration coverage -oxygen requirement improved yesterday with diuresis   HFrEF -newly found on echo  -likely related to critical illness; would repeat echo as an outpatient once he recovers -received total of 80 mg IV lasix with net negative 7L; seemed to improve oxygen requirement as above    -appears euvolemic at this time -Metoprolol started yesterday   Sinus with frequent PACs -no evidence of afib, irregular rhythm appears consistent with sinus rhythm with PACs -continue Metoprolol BID    For questions or updates, please contact [place] Please consult [email or web address] for contact info under       Signed, Delice Bison, DO  03/27/2019, 8:09 AM    [facility] will sign off.   Medication Recommendations:  Metoprolol 25 mg BID, can consolidate to toprol XL 50 mg on discharge Other recommendations (labs, testing, etc):  None Follow up as an outpatient:  Will schedule outpatient follow-up   Patient seen and examined.  Agree with above documentation.  On exam, patient is alert, regular rate and irregular rhythm, no murmurs, no LE edema or JVD.  Telemetry shows sinus rhythm with frequent PACs/PVCs; no clear AF seen..  Started on metoprolol 25mg  BID for new systolic dysfunction and frequent PACs/PVCs.  Will plan repeat TTE as outpatient after recovers from acute illness.  Donato Heinz, MD

## 2019-03-27 NOTE — Evaluation (Signed)
Clinical/Bedside Swallow Evaluation Patient Details  Name: Jonathan Berger MRN: [id number] Date of Birth: May 29, 1931  Today's Date: 03/27/2019 Time: SLP Start Time (ACUTE ONLY): 0827 SLP Stop Time (ACUTE ONLY): 0840 SLP Time Calculation (min) (ACUTE ONLY): 13 min  Past Medical History:  Past Medical History:  Diagnosis Date  . Anemia   . Chronic back pain    herniated disc  . Hepatitis    50+yrs ago  . Hypertension    takes Amlodipine/Valsartan/HCTZ daily  . Personal history of kidney stones    Past Surgical History:  Past Surgical History:  Procedure Laterality Date  . BACK SURGERY  1977/006  . COLONOSCOPY    . CYSTOSCOPY    . ESOPHAGOGASTRODUODENOSCOPY    . IR ANGIO INTRA EXTRACRAN SEL COM CAROTID INNOMINATE UNI L MOD SED  04/11/2019  . IR ANGIO VERTEBRAL SEL SUBCLAVIAN INNOMINATE UNI L MOD SED  04/01/2019  . IR CT HEAD LTD  03/17/2019  . IR ENDOVASC INTRACRANIAL INF OTHER THAN THROMBO ART INC DIAG ANGIO  03/31/2019  . IR INTRAVSC STENT CERV CAROTID W/O EMB-PROT MOD SED INC ANGIO  04/05/2019  . LITHOTRIPSY    . LUMBAR LAMINECTOMY  01/29/2011   Procedure: MICRODISCECTOMY LUMBAR LAMINECTOMY;  Surgeon: Eldred Manges;  Location: [facility] OR;  Service: Orthopedics;  Laterality: Right;  Right L4-5 Microdiscectomy  . RADIOLOGY WITH ANESTHESIA N/A 03/23/2019   Procedure: RADIOLOGY WITH ANESTHESIA;  Surgeon: Julieanne Cotton, MD;  Location: [facility] OR;  Service: Radiology;  Laterality: N/A;   HPI:  Pt is an 86 year old male presenting 2/7 with slurred speech and dysphagia, found to have acute R MCA stroke s/p tPA and EVR R MCA and ICA with stent. ETT 2/7-2/11. PMH: HTN   Assessment / Plan / Recommendation Clinical Impression  Pt has adequate phonation s/p extubation on previous date, but his speech is slurred and he has a lesion on the L inside of his upper lip that is sore to the touch. He cannot swallow volitionally, but water elicits multiple subswallows that are followed by immediate and delayed  coughing. Given clinical presentation I suspect a more neurologically based dysphagia, although post-extubation side effects are not excluded. Recommend allowing ice chips sparingly with staff after oral care for now. SLP will f/u for PO readiness with likely instrumental study needed first.  SLP Visit Diagnosis: Dysphagia, unspecified (R13.10)    Aspiration Risk  Moderate aspiration risk    Diet Recommendation NPO;Ice chips PRN after oral care   Medication Administration: Via alternative means    Other  Recommendations Oral Care Recommendations: Oral care QID Other Recommendations: Have oral suction available   Follow up Recommendations Inpatient Rehab      Frequency and Duration min 2x/week  2 weeks       Prognosis Prognosis for Safe Diet Advancement: Good Barriers to Reach Goals: Cognitive deficits      Swallow Study   General HPI: Pt is an 86 year old male presenting 2/7 with slurred speech and dysphagia, found to have acute R MCA stroke s/p tPA and EVR R MCA and ICA with stent. ETT 2/7-2/11. PMH: HTN Type of Study: Bedside Swallow Evaluation Previous Swallow Assessment: none in chart Diet Prior to this Study: NPO Temperature Spikes Noted: Yes(100.3) History of Recent Intubation: Yes Length of Intubations (days): 4 days Date extubated: 03/26/19 Behavior/Cognition: Alert;Cooperative;Pleasant mood;Confused;Requires cueing Oral Cavity Assessment: Dry;Other (comment)(lesion on inside of upper lip, L side) Oral Care Completed by SLP: Yes Oral Cavity - Dentition: Poor condition Vision: Functional for self-feeding Self-Feeding  Abilities: Needs assist Patient Positioning: Upright in bed Baseline Vocal Quality: Normal Volitional Cough: Weak Volitional Swallow: Unable to elicit    Oral/Motor/Sensory Function Overall Oral Motor/Sensory Function: Within functional limits   Ice Chips Ice chips: Impaired Presentation: Spoon Pharyngeal Phase Impairments: Multiple swallows    Thin Liquid Thin Liquid: Impaired Presentation: Spoon;Cup Oral Phase Impairments: Reduced labial seal Pharyngeal  Phase Impairments: Multiple swallows;Cough - Immediate;Cough - Delayed    Nectar Thick Nectar Thick Liquid: Not tested   Honey Thick Honey Thick Liquid: Not tested   Puree Puree: Not tested   Solid     Solid: Not tested       Osie Bond., M.A. [place] Pager [phone] Office [phone]  03/27/2019,9:20 AM

## 2019-03-27 NOTE — Procedures (Signed)
Cortrak  Tube Type:  Cortrak - 43 inches Tube Location:  Right nare Initial Placement:  Postpyloric Secured by: Bridle Technique Used to Measure Tube Placement:  Documented cm marking at nare/ corner of mouth Cortrak Secured At:  75 cm Procedure Comments:  Tube just inside pylorus    Cortrak Tube Team Note:  Consult received to place a Cortrak feeding tube.   No x-ray is required. RN may begin using tube.   If the tube becomes dislodged please keep the tube and contact the Cortrak team at [email or web address] ([email or web address]) for replacement.  If after hours and replacement cannot be delayed, place a NG tube and confirm placement with an abdominal x-ray.    Betsey Holiday MS, RD, LDN Contact information available in Amion

## 2019-03-27 NOTE — Progress Notes (Signed)
Will keep PICC for difficult IV access per Dr. Roda Shutters

## 2019-03-27 NOTE — Progress Notes (Signed)
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  Extubated 03/26/2019.    PT Comments    Patient progressing this session with initiation, participation, balance, and activity tolerance.  Stood in [place] entire time transferring to chair.  Assisted nursing for pt OOB due to restless in bed.  Noted decreased R ankle DF compared to L but able to hold both legs up for 10 s with minimal drift.  Feel continued skilled PT indicated prior to d/c.  Still recommending CIR as pt independent prior to his wife has been visiting since admission so able to assist.     Follow Up Recommendations  CIR     Equipment Recommendations  Other (comment)(TBA)    Recommendations for Other Services       Precautions / Restrictions Precautions Precautions: Fall Precaution Comments: venturi mask, restraints    Mobility  Bed Mobility Overal bed mobility: Needs Assistance Bed Mobility: Supine to Sit     Supine to sit: HOB elevated;Mod assist     General bed mobility comments: assist for trunk and to bring legs off bed  Transfers   Equipment used: Rolling walker (2 wheeled) Transfers: Sit to/from [facility] Sit to Stand: Mod assist;+2 physical assistance Stand pivot transfers: +2 physical assistance;Total assist       General transfer comment: some lifting help to stand to [facility] with cues for hand placement and safety with lines  Ambulation/Gait             General Gait Details: NT (RN reports significant ataxia)   Stairs             Wheelchair Mobility    Modified Rankin (Stroke Patients Only) Modified Rankin (Stroke Patients Only) Pre-Morbid Rankin Score: No symptoms Modified Rankin:  Severe disability     Balance Overall balance assessment: Needs assistance Sitting-balance support: Feet supported Sitting balance-Leahy Scale: Fair Sitting balance - Comments: holding bed with 1 UE support, but no LOB   Standing balance support: Bilateral upper extremity supported;During functional activity Standing balance-Leahy Scale: Poor Standing balance comment: UE support for balance while transitioning to recliner                            Cognition Arousal/Alertness: Awake/alert Behavior During Therapy: Impulsive Overall Cognitive Status: Impaired/Different from baseline Area of Impairment: Memory;Orientation;Attention;Following commands;Problem solving                 Orientation Level: Disoriented to;Place;Time;Situation   Memory: Decreased short-term memory;Decreased recall of precautions Following Commands: Follows one step commands inconsistently;Follows one step commands with increased time Safety/Judgement: Decreased awareness of deficits;Decreased awareness of safety   Problem Solving: Slow processing;Decreased initiation;Requires verbal cues General Comments: multiple cues to complete tasks and slow to respond, but responsive with repitition; initially restless in bed and assisted RN to get pt to chair      Exercises      General Comments General comments (skin integrity, edema, etc.): on O2 via [place] @ 4LPM      Pertinent Vitals/Pain Pain Assessment: Faces Faces Pain Scale: Hurts little more Pain Location: generalized with mobility Pain Descriptors / Indicators: Grimacing;Moaning Pain Intervention(s): Monitored during session;Repositioned    Home Living  Prior Function            PT Goals (current goals can now be found in the care plan section) Progress towards PT goals: Progressing toward goals    Frequency    Min 4X/week      PT Plan Current plan remains appropriate    Co-evaluation               AM-PAC PT "6 Clicks" Mobility   Outcome Measure  Help needed turning from your back to your side while in a flat bed without using bedrails?: A Little Help needed moving from lying on your back to sitting on the side of a flat bed without using bedrails?: A Lot Help needed moving to and from a bed to a chair (including a wheelchair)?: A Lot Help needed standing up from a chair using your arms (e.g., wheelchair or bedside chair)?: A Lot Help needed to walk in hospital room?: Total Help needed climbing 3-5 steps with a railing? : Total 6 Click Score: 11    End of Session Equipment Utilized During Treatment: Oxygen Activity Tolerance: Patient tolerated treatment well Patient left: in chair;with call bell/phone within reach;with chair alarm set;with nursing/sitter in room   PT Visit Diagnosis: Other abnormalities of gait and mobility (R26.89);Muscle weakness (generalized) (M62.81);Other symptoms and signs involving the nervous system (R29.898)     Time: 5573-2202 PT Time Calculation (min) (ACUTE ONLY): 21 min  Charges:  $Therapeutic Activity: 8-22 mins                     Sheran Lawless, [place] [facility] [phone] 03/27/2019    [name] 03/27/2019, 1:56 PM

## 2019-03-27 NOTE — Plan of Care (Signed)
Contacted by RN regarding no p.o. access to give metoprolol, donepezil or Brilinta.  Requested trying NG tube placement  Received call back saying attempts x3 for NG tube failed.  Unfortunately there is no IV substitute for Brilinta-we will order rectal aspirin for now.  He is also on heparin drip.  Donepezil can be held without any problems until a proper oral access is obtained  Heart rate running in the 100s.  If becomes tachycardic, will consider doing metoprolol IV.  Till then is okay to hold the beta-blocker.  Callback instructions provided to the bedside RN.  -- Milon Dikes, MD [facility]

## 2019-03-27 NOTE — Progress Notes (Signed)
PULMONARY / CRITICAL CARE MEDICINE   NAME:  Jonathan Berger, MRN:  [id number], DOB:  08-27-1931, LOS: 5 ADMISSION DATE:  April 12, 2019, CONSULTATION DATE: 04/12/2019 REFERRING MD: Interventional radiology neurology, CHIEF COMPLAINT: Vent dependent respiratory failure secondary to procedure  BRIEF HISTORY:    86 year old presenting 2/7 with slurred speech and dysphagia found to have acute right middle cerebral artery stroke s/p tPA and EVR who returns to ICU on mechanical ventilation overnight.   SIGNIFICANT PAST MEDICAL HISTORY   Hypertension  SIGNIFICANT EVENTS:  2019-04-12 right MCA stroke  STUDIES:   2/7 CTH >>  Hyperdense Right MCA suspicious for emergent large vessel occlusion.  No acute intracranial hemorrhage. 2/7 CTA head/ neck>> Positive for Right MCA Emergent Large Vessel Occlusion affecting the mid and distal M1 segment. Critical Stenosis cervical Right ICA beginning at the origin, throughout the proximal 3.5 cm of the vessel. There are tandem RADIOGRAPHIC STRING SIGN STENOSES. Additional severe atherosclerosis and multifocal high-grade arterial stenoses elsewhere: - Left ICA distal bulb RADIOGRAPHIC STRING SIGN STENOSIS. - Severe bilateral Vertebral Artery origin stenoses. - proximal Left Subclavian Artery moderate stenosis due to plaque and tortuosity. 2/8 TTE >>  Left ventricular ejection fraction, by estimation, is 45%. The left ventricle has mildly decreased function. The left ventrical demonstrates regional wall motion abnormalities (see scoring diagram/findings for description). Akinesis of the mid to apical anteroseptal and inferoseptal walls. Left ventricular diastolic parameters are consistent with Grade I diastolic dysfunction (impaired relaxation). 2. Right ventricular systolic function is mildly reduced. 2/8 MRI Brain >> 3.1 cm acute cortical/subcortical infarct within the anterolateral right temporal lobe. Numerous additional small scattered acute cortical and subcortical infarct  within the right cerebral hemisphere MCA vascular territory.  CULTURES:  2/7 SARS2/ Flu A/B >> neg 2/8 urine > negative 2/8 resp > normal flora  ANTIBIOTICS:  2/7 cefazolin pre op 2/9 unasyn >>  LINES/TUBES:  April 12, 2019 endotracheal tube >> Apr 12, 2019 left radial A-line >>2/9 04-12-19 right femoral sheath >>2/9  CONSULTANTS:  04/12/2019 pulmonary critical care   SUBJECTIVE/INTERVAL:  Tolerated extubation.  Initially on NRB but now down to 4L [place]. Appears can still have some volume removed. Was slightly agitated overnight trying to get out of bed                       CONSTITUTIONAL: BP (!) 143/62   Pulse (!) 140   Temp 99.1 F (37.3 C) (Oral)   Resp (!) 22   Ht 5\' 11"  (1.803 m)   Wt 85.8 kg   SpO2 99%   BMI 26.38 kg/m   PHYSICAL EXAM:  General:  Elderly male, in NAD HEENT: [place]/AT, EOMI Neuro: awake, alert, follows simple commands.  CV: Tachycardic, regular.   PULM: Faint crackles GI: soft, bs+, foley Extremities: warm/dry. No acute deformity Skin: Grossly intact.                                                                           ASSESSMENT AND PLAN    Acute hypoxemic respiratory failure: concern he has aspirated and CXR supports this with RLL infiltrate.  Also some slight volume overload. - Continue supplemental O2 as needed to maintain SpO2 > 92%. - Continue unasyn for 7 day  course total abx. - 40mg  lasix.  R MCA infarct s/p tPA and IR with L ICA stent placement. In and out of AF vs ST with PAC.  -  Per Neurology   Hypotension - resolved.  Felt to be due to sedation. - No further interventions required.  New HFrEF SR with PACs - Cardiology has evaluated and recommends beta blocker when able - repeat Echo as outpatient.   Hypokalemia - 6 runs K, can change to per tube once cortrak has been placed (will likely need more after lasix)  Hypophosphatemia. - K phos.  Generalized deconditioning. - Probably needs CIR.  Dysphagia. - Cortrak  placement pending.   Nothing further to add.  PCCM will sign off.  Please do not hesitate to call back if we can be of any further assistance.    [place], Rutherford [facility]ys [facility] 03/27/2019, 9:54 AM

## 2019-03-27 NOTE — Plan of Care (Signed)
ON CALL CROSS COVERAGE NOTE  Received multiple calls form the floor RN re: patient pulling on cortrak and oter lines etc interfering with treatment and making him unsafe for himself. Ordered b/l wrist restraints and Seroquel 12.5mg  via tube. Remains agitated and attemtping to get out of bed. Ordered b/l ankle restraints.  He may need a 1:1 sitter and redirection if remains agitated. Would like to avoid benzos and sleep-aids as much as possible.  -- Milon Dikes, MD [facility] Pager: [phone] If 7pm to 7am, please call on call as listed on AMION.

## 2019-03-27 NOTE — Evaluation (Signed)
Speech Language Pathology Evaluation Patient Details Name: Jonathan Berger MRN: [id number] DOB: January 23, 1932 Today's Date: 03/27/2019 Time: 9381-0175 SLP Time Calculation (min) (ACUTE ONLY): 15 min  Problem List:  Patient Active Problem List   Diagnosis Date Noted  . Respiratory failure ([place])   . Acute on chronic respiratory failure with hypoxia ([place])   . Hypotension   . Stroke (cerebrum) ([place]) 03/31/2019  . Middle cerebral artery embolism, right 03/21/2019  . Herniated nucleus pulposus, L4-5 right 01/26/2011   Past Medical History:  Past Medical History:  Diagnosis Date  . Anemia   . Chronic back pain    herniated disc  . Hepatitis    50+yrs ago  . Hypertension    takes Amlodipine/Valsartan/HCTZ daily  . Personal history of kidney stones    Past Surgical History:  Past Surgical History:  Procedure Laterality Date  . BACK SURGERY  1977/006  . COLONOSCOPY    . CYSTOSCOPY    . ESOPHAGOGASTRODUODENOSCOPY    . IR ANGIO INTRA EXTRACRAN SEL COM CAROTID INNOMINATE UNI L MOD SED  03/28/2019  . IR ANGIO VERTEBRAL SEL SUBCLAVIAN INNOMINATE UNI L MOD SED  04/02/2019  . IR CT HEAD LTD  03/16/2019  . IR ENDOVASC INTRACRANIAL INF OTHER THAN THROMBO ART INC DIAG ANGIO  03/30/2019  . IR INTRAVSC STENT CERV CAROTID W/O EMB-PROT MOD SED INC ANGIO  03/27/2019  . LITHOTRIPSY    . LUMBAR LAMINECTOMY  01/29/2011   Procedure: MICRODISCECTOMY LUMBAR LAMINECTOMY;  Surgeon: Marybelle Killings;  Location: [place];  Service: Orthopedics;  Laterality: Right;  Right L4-5 Microdiscectomy  . RADIOLOGY WITH ANESTHESIA N/A 03/28/2019   Procedure: RADIOLOGY WITH ANESTHESIA;  Surgeon: Luanne Bras, MD;  Location: [place] ;  Service: Radiology;  Laterality: N/A;   HPI:  Pt is an 86 year old male presenting 2/7 with slurred speech and dysphagia, found to have acute R MCA stroke s/p tPA and EVR R MCA and ICA with stent. ETT 2/7-2/11. PMH: HTN   Assessment / Plan / Recommendation Clinical Impression  Pt's articulation is  mildly slurred, although good phonation s/p extubation likely helps his intelligibility. He is oriented x2 (person, place) and throughout evaluation needs extra processing time and repetitions from comprehension of simple questions and one-step commands. His recall of daily events, even since extubation, is limited. Min-Mod cues were provided for functional problem solving. Pt would benefit from ongoing SLP f/u to maximize cognition, communication, and safety.     SLP Assessment  SLP Recommendation/Assessment: Patient needs continued Speech Lanaguage Pathology Services SLP Visit Diagnosis: Cognitive communication deficit (R41.841)    Follow Up Recommendations  Inpatient Rehab    Frequency and Duration min 2x/week  2 weeks      SLP Evaluation Cognition  Overall Cognitive Status: Impaired/Different from baseline Arousal/Alertness: Awake/alert Orientation Level: Oriented to person;Disoriented to time;Disoriented to situation;Oriented to place Attention: Sustained Sustained Attention: Impaired Sustained Attention Impairment: Functional basic;Verbal basic Memory: Impaired Memory Impairment: Decreased recall of new information Awareness: Impaired Awareness Impairment: Intellectual impairment Problem Solving: Impaired Problem Solving Impairment: Functional basic Safety/Judgment: Impaired       Comprehension  Auditory Comprehension Overall Auditory Comprehension: Impaired Commands: Impaired One Step Basic Commands: 50-74% accurate Conversation: Simple Interfering Components: Hearing    Expression Expression Primary Mode of Expression: Verbal Verbal Expression Overall Verbal Expression: Appears within functional limits for tasks assessed   Oral / Motor  Oral Motor/Sensory Function Overall Oral Motor/Sensory Function: Within functional limits Motor Speech Overall Motor Speech: Impaired Respiration: Within functional limits Phonation: Normal  Resonance: Within functional  limits Articulation: Impaired Level of Impairment: Sentence Intelligibility: Intelligibility reduced Sentence: 75-100% accurate   GO                     Mahala Menghini., M.A. CCC-SLP Acute Rehabilitation Services Pager [phone] Office [phone]  03/27/2019, 9:27 AM

## 2019-03-27 NOTE — Consult Note (Signed)
Physical Medicine and Rehabilitation Consult   Reason for Consult: Stroke with functional deficits Referring Physician: Dr. Roda Shutters   HPI: Jonathan Berger is a 86 y.o. Left handed male with history of HTN, chronic back pain, dementia; who was admitted on 03/16/2019 with sudden onset of left facial droop with drooling, slurred speech and fall with inability to walk. CT head showed dense R-MCA sign and CTA/perfusion head/neck showed near occlusion R-ICA throughout proximal 3.5 cm with string sign stenosis, L-ICA distal string sign and positive for R-MCA LVO. He received tPA and underwent cerebral angio with angioplasty and complete revascularization of R-MCA with stent angioplasty by Dr. Corliss Skains. Follow up MRI brain revealed 3.1 cm cortical/subcortical infarct in anterolateral right temporal lobe and numerous scattered small cortical/subcortical infarcts within R-MCA vascular territory.  Post procedure with hypotension requiring pressors, hypokalemia, bouts of agitation as well as hypoxia due to aspiration PNA. He was started on IV antibiotics on 02/09 and IV heparin added due to concerns of  A fib. 2D echo showed EF 45% with akinesis of mid to apical anteroseptal and inferoseptal walls and dilated IVC  Cardiology consulted for input and felt that patient with ectopy/NSVT --no A fib. BB added for rate control and reduction in systolic function felt to be due to critical illness. He tolerated extubation 2/11 and therapy evaluations completed. Patient with resultant dysarthria, left sided weakness, dysmetria L>R and cognitive deficits affecting ADLs and mobility.  CIR recommended due to functional decline.     ROS Denies pain, constipation. Sleeping well at night. ROS otherwise limited due to cognition.    Past Medical History:  Diagnosis Date  . Anemia   . Chronic back pain    herniated disc  . Hepatitis    50+yrs ago  . Hypertension    takes Amlodipine/Valsartan/HCTZ daily  . Personal  history of kidney stones     Past Surgical History:  Procedure Laterality Date  . BACK SURGERY  1977/006  . COLONOSCOPY    . CYSTOSCOPY    . ESOPHAGOGASTRODUODENOSCOPY    . IR ANGIO INTRA EXTRACRAN SEL COM CAROTID INNOMINATE UNI L MOD SED  03/28/2019  . IR ANGIO VERTEBRAL SEL SUBCLAVIAN INNOMINATE UNI L MOD SED  03/16/2019  . IR CT HEAD LTD  04/01/2019  . IR ENDOVASC INTRACRANIAL INF OTHER THAN THROMBO ART INC DIAG ANGIO  04/10/2019  . IR INTRAVSC STENT CERV CAROTID W/O EMB-PROT MOD SED INC ANGIO  04/11/2019  . LITHOTRIPSY    . LUMBAR LAMINECTOMY  01/29/2011   Procedure: MICRODISCECTOMY LUMBAR LAMINECTOMY;  Surgeon: Eldred Manges;  Location: [facility] OR;  Service: Orthopedics;  Laterality: Right;  Right L4-5 Microdiscectomy  . RADIOLOGY WITH ANESTHESIA N/A 04/12/2019   Procedure: RADIOLOGY WITH ANESTHESIA;  Surgeon: Julieanne Cotton, MD;  Location: [facility] OR;  Service: Radiology;  Laterality: N/A;    Family History  Problem Relation Age of Onset  . Anesthesia problems Neg Hx   . Hypotension Neg Hx   . Malignant hyperthermia Neg Hx   . Pseudochol deficiency Neg Hx     Social History:  Married. Independent without cane. Used to work for [employer]. Per reports that he quit smoking about 45 years ago. His smoking use included cigarettes. He has a 7.50 pack-year smoking history. He has never used smokeless tobacco. Per reports current alcohol use. He reports that he does not use drugs.    Allergies: No Known Allergies    Medications Prior to Admission  Medication Sig Dispense  Refill  . amLODipine (NORVASC) 5 MG tablet Take 5 mg by mouth daily.    Marland Kitchen donepezil (ARICEPT) 5 MG tablet Take 5 mg by mouth at bedtime.    . Ensure (ENSURE) Take 237 mLs by mouth 3 (three) times daily between meals.    . memantine (NAMENDA) 5 MG tablet Take 5 mg by mouth 2 (two) times daily.    Marland Kitchen triamcinolone cream (KENALOG) 0.1 % Apply 1 application topically at bedtime. Apply to rash on back of right leg below knee    .  valsartan-hydrochlorothiazide (DIOVAN-HCT) 160-12.5 MG tablet Take 1 tablet by mouth daily.      Home: Home Living Family/patient expects to be discharged to:: Private residence Living Arrangements: Spouse/significant other Available Help at Discharge: Family Type of Home: House Home Access: Stairs to enter Technical brewer of Steps: 3-4 Entrance Stairs-Rails: Right, Left Home Layout: Two level, Bed/bath upstairs Alternate Level Stairs-Number of Steps: flight Alternate Level Stairs-Rails: Right Bathroom Shower/Tub: Tub/shower unit, Multimedia programmer: Standard Home Equipment: Other (comment) Additional Comments: info taken from when PT evaluated and was able to speak with wife  Functional History: Prior Function Level of Independence: Independent Functional Status:  Mobility: Bed Mobility Overal bed mobility: Needs Assistance Bed Mobility: Supine to Sit Supine to sit: Min assist General bed mobility comments: pt initiating BLEs, needing assist on L side, used bed rail to pull up Transfers Overall transfer level: Needs assistance Equipment used: Ambulation equipment used Transfer via Lift Equipment: Stedy Transfers: Sit to/from [employer] to Stand: Min assist, +2 physical assistance, +2 safety/equipment General transfer comment: attempted first without stedy at heavy mod A +2, pt with difficulty coordinating and showing dysmetria making further transfer unsafe. Improved to min A +2 with stedy, pt able to tolerate standing throughout entire time in stedy      ADL: ADL Overall ADL's : Needs assistance/impaired Grooming: Sitting, Minimal assistance, Cueing for safety, Cueing for sequencing Upper Body Bathing: Moderate assistance, Sitting, Cueing for safety, Cueing for sequencing Lower Body Bathing: Maximal assistance, Sit to/from stand, Sitting/lateral leans, Cueing for safety, Cueing for sequencing Upper Body Dressing : Moderate assistance, Sitting, Cueing  for safety, Cueing for sequencing Lower Body Dressing: Maximal assistance, Total assistance, Sitting/lateral leans, Sit to/from stand, Cueing for safety, Cueing for sequencing Lower Body Dressing Details (indicate cue type and reason): attempted to don socks sitting EOB, unable to maintain balance and have BUE coordinationand strength to manipulate sock Toilet Transfer: Minimal assistance, +2 for physical assistance, +2 for safety/equipment, RW, Cueing for safety, Cueing for sequencing Toilet Transfer Details (indicate cue type and reason): with use of stedy; attempted to sit <> stand with heavy mod A +2 Toileting- Clothing Manipulation and Hygiene: Maximal assistance, Sit to/from stand Functional mobility during ADLs: Minimal assistance, +2 for physical assistance, +2 for safety/equipment(use of stedy) General ADL Comments: pt made turn around this session, able to attend to activities and participate with therapy after getting getting lasix and fluid off. Pt able to engage in functional transfers  Cognition: Cognition Overall Cognitive Status: Impaired/Different from baseline Orientation Level: Oriented to person, Disoriented to place, Disoriented to time, Disoriented to situation Cognition Arousal/Alertness: Awake/alert Behavior During Therapy: WFL for tasks assessed/performed Overall Cognitive Status: Impaired/Different from baseline Area of Impairment: Orientation, Memory, Following commands, Safety/judgement, Problem solving Orientation Level: Disoriented to, Situation, Time Memory: Decreased short-term memory Following Commands: Follows one step commands inconsistently Safety/Judgement: Decreased awareness of deficits, Decreased awareness of safety Problem Solving: Slow processing, Decreased initiation, Difficulty sequencing,  Requires verbal cues, Requires tactile cues General Comments: pt needing repitition and max cues to maintain impulsivity; overall slower processing speed and  needing short easily redirectable cues to engage in session Difficult to assess due to: (venturi mask, agitated)   Blood pressure 128/66, pulse 82, temperature 99.1 F (37.3 C), temperature source Oral, resp. rate 15, height 5\' 11"  (1.803 m), weight 85.8 kg, SpO2 98 %.  Physical Exam  Nursing note and vitals reviewed. Constitutional: He is oriented to person, place. He appears well-developed. No distress. Nasal cannula in place. Cortrak in place.  Frail appearing-Sunken cheeks and muscle wasting bilateral temples. Edentulous.   Cardiovascular: An irregular rhythm present. Tachycardia present.  Heart rate irregular with intermittent tachycardia with rate up to 120's.   Respiratory: He has rhonchi in the right upper field and the right middle field.  GI: He exhibits distension.  Musculoskeletal:     Comments: Left forearm with diffuse ecchymosis.   Neurological: He is alert and oriented to person and place. Says month is January.  Dysarthric speech--knew he was in the hospital but thought that he was at [facility]. Mild left inattention noted. Left sided weakness with decrease in motor control LLE.  LUE 3/5 throughout, RUE 4/5 throughout, BLEs 4-/5 throughout Skin: He is not diaphoretic. Right groin incision C/D/I  Results for orders placed or performed during the hospital encounter of 04/05/2019 (from the past 24 hour(s))  Heparin level (unfractionated)     Status: Abnormal   Collection Time: 03/26/19 12:00 PM  Result Value Ref Range   Heparin Unfractionated 0.27 (L) 0.30 - 0.70 IU/mL  Glucose, capillary     Status: Abnormal   Collection Time: 03/26/19 12:47 PM  Result Value Ref Range   Glucose-Capillary 104 (H) 70 - 99 mg/dL  Basic metabolic panel     Status: Abnormal   Collection Time: 03/26/19  5:06 PM  Result Value Ref Range   Sodium 143 135 - 145 mmol/L   Potassium 2.9 (L) 3.5 - 5.1 mmol/L   Chloride 107 98 - 111 mmol/L   CO2 25 22 - 32 mmol/L   Glucose, Bld 141 (H) 70 - 99 mg/dL    BUN 16 8 - 23 mg/dL   Creatinine, Ser 05/24/19 0.61 - 1.24 mg/dL   Calcium 8.7 (L) 8.9 - 10.3 mg/dL   GFR calc non Af Amer 56 (L) >60 mL/min   GFR calc Af Amer >60 >60 mL/min   Anion gap 11 5 - 15  CBC     Status: Abnormal   Collection Time: 03/27/19  4:57 AM  Result Value Ref Range   WBC 7.9 4.0 - 10.5 K/uL   RBC 3.41 (L) 4.22 - 5.81 MIL/uL   Hemoglobin 11.2 (L) 13.0 - 17.0 g/dL   HCT 05/25/19 (L) 81.1 - 91.4 %   MCV 99.1 80.0 - 100.0 fL   MCH 32.8 26.0 - 34.0 pg   MCHC 33.1 30.0 - 36.0 g/dL   RDW 78.2 95.6 - 21.3 %   Platelets 120 (L) 150 - 400 K/uL   nRBC 0.0 0.0 - 0.2 %  Heparin level (unfractionated)     Status: None   Collection Time: 03/27/19  4:57 AM  Result Value Ref Range   Heparin Unfractionated 0.32 0.30 - 0.70 IU/mL  Basic metabolic panel     Status: Abnormal   Collection Time: 03/27/19  4:57 AM  Result Value Ref Range   Sodium 145 135 - 145 mmol/L   Potassium 2.9 (L) 3.5 -  5.1 mmol/L   Chloride 106 98 - 111 mmol/L   CO2 27 22 - 32 mmol/L   Glucose, Bld 120 (H) 70 - 99 mg/dL   BUN 17 8 - 23 mg/dL   Creatinine, Ser 9.38 0.61 - 1.24 mg/dL   Calcium 8.6 (L) 8.9 - 10.3 mg/dL   GFR calc non Af Amer >60 >60 mL/min   GFR calc Af Amer >60 >60 mL/min   Anion gap 12 5 - 15  Magnesium     Status: None   Collection Time: 03/27/19  4:57 AM  Result Value Ref Range   Magnesium 1.9 1.7 - 2.4 mg/dL  Phosphorus     Status: Abnormal   Collection Time: 03/27/19  4:57 AM  Result Value Ref Range   Phosphorus 1.6 (L) 2.5 - 4.6 mg/dL    DG Chest Port 1 View  Result Date: 03/26/2019 CLINICAL DATA:  Hypoxia EXAM: PORTABLE CHEST 1 VIEW COMPARISON:  Two days ago FINDINGS: Endotracheal tube tip is halfway between the clavicular heads and carina. Interval right PICC with tip at the upper right atrium. The enteric tube at least reaches the stomach. Dense opacity at the bases with worsening aeration along the diaphragm which is no longer distinct. The hazy density, especially on the right may  reflect dependent pleural fluid. No pneumothorax. Cardiomegaly. IMPRESSION: 1. Unremarkable hardware positioning. 2. Worsening opacification which is likely both pulmonary opacity and pleural fluid. Electronically Signed   By: Marnee Spring M.D.   On: 03/26/2019 07:47     Assessment/Plan: Diagnosis: Right middle cerebral artery embolism 1. Does the need for close, 24 hr/day medical supervision in concert with the patient's rehab needs make it unreasonable for this patient to be served in a less intensive setting? Yes 2. Co-Morbidities requiring supervision/potential complications: acute on chronic respiratory failure with hypoxia 3. Due to bladder management, bowel management, safety, skin/wound care, disease management, medication administration, pain management and patient education, does the patient require 24 hr/day rehab nursing? Yes 4. Does the patient require coordinated care of a physician, rehab nurse, therapy disciplines of PT, OT, SLP to address physical and functional deficits in the context of the above medical diagnosis(es)? Yes Addressing deficits in the following areas: balance, endurance, locomotion, strength, transferring, bowel/bladder control, bathing, dressing, feeding, grooming, toileting, cognition, speech and psychosocial support 5. Can the patient actively participate in an intensive therapy program of at least 3 hrs of therapy per day at least 5 days per week? Yes 6. The potential for patient to make measurable gains while on inpatient rehab is excellent 7. Anticipated functional outcomes upon discharge from inpatient rehab are modified independent  with PT, modified independent with OT, supervision with SLP. 8. Estimated rehab length of stay to reach the above functional goals is: 16-20 days 9. Anticipated discharge destination: Home 10. Overall Rehab/Functional Prognosis: excellent  RECOMMENDATIONS: This patient's condition is appropriate for continued rehabilitative  care in the following setting: CIR Patient has agreed to participate in recommended program. Yes Note that insurance prior authorization may be required for reimbursement for recommended care.  Comment: Mr. Biggins would be an excellent CIR candidate once medically stable. Just had Cortrak inserted and tolerated procedure well. AOx2. Radiology has recommended carotid US today to check patency of right ICA stent. He is comfortable, denying pain and sleeping well.   Jacquelynn Cree, PA-C 03/27/2019

## 2019-03-28 ENCOUNTER — Inpatient Hospital Stay (HOSPITAL_COMMUNITY): Payer: Medicare Other

## 2019-03-28 DIAGNOSIS — I639 Cerebral infarction, unspecified: Secondary | ICD-10-CM

## 2019-03-28 LAB — CBC
HCT: 36.1 % — ABNORMAL LOW (ref 39.0–52.0)
Hemoglobin: 12 g/dL — ABNORMAL LOW (ref 13.0–17.0)
MCH: 32.6 pg (ref 26.0–34.0)
MCHC: 33.2 g/dL (ref 30.0–36.0)
MCV: 98.1 fL (ref 80.0–100.0)
Platelets: 142 10*3/uL — ABNORMAL LOW (ref 150–400)
RBC: 3.68 MIL/uL — ABNORMAL LOW (ref 4.22–5.81)
RDW: 13.5 % (ref 11.5–15.5)
WBC: 8.7 10*3/uL (ref 4.0–10.5)
nRBC: 0 % (ref 0.0–0.2)

## 2019-03-28 LAB — BASIC METABOLIC PANEL WITH GFR
Anion gap: 12 (ref 5–15)
BUN: 24 mg/dL — ABNORMAL HIGH (ref 8–23)
CO2: 22 mmol/L (ref 22–32)
Calcium: 8.9 mg/dL (ref 8.9–10.3)
Chloride: 109 mmol/L (ref 98–111)
Creatinine, Ser: 1.03 mg/dL (ref 0.61–1.24)
GFR calc Af Amer: >60 mL/min
GFR calc non Af Amer: >60 mL/min
Glucose, Bld: 147 mg/dL — ABNORMAL HIGH (ref 70–99)
Potassium: 2.8 mmol/L — ABNORMAL LOW (ref 3.5–5.1)
Sodium: 143 mmol/L (ref 135–145)

## 2019-03-28 LAB — GLUCOSE, CAPILLARY
Glucose-Capillary: 118 mg/dL — ABNORMAL HIGH (ref 70–99)
Glucose-Capillary: 118 mg/dL — ABNORMAL HIGH (ref 70–99)
Glucose-Capillary: 120 mg/dL — ABNORMAL HIGH (ref 70–99)
Glucose-Capillary: 138 mg/dL — ABNORMAL HIGH (ref 70–99)
Glucose-Capillary: 146 mg/dL — ABNORMAL HIGH (ref 70–99)
Glucose-Capillary: 149 mg/dL — ABNORMAL HIGH (ref 70–99)

## 2019-03-28 LAB — HEPARIN LEVEL (UNFRACTIONATED): Heparin Unfractionated: <0.1 IU/mL — ABNORMAL LOW (ref 0.30–0.70)

## 2019-03-28 MED ORDER — POTASSIUM CHLORIDE 20 MEQ/15ML (10%) PO SOLN
20.0000 meq | Freq: Three times a day (TID) | ORAL | Status: DC
  Administered 2019-03-28: 20 meq via ORAL
  Filled 2019-03-28: qty 15

## 2019-03-28 MED ORDER — LORAZEPAM 0.5 MG PO TABS
0.5000 mg | ORAL_TABLET | ORAL | Status: DC
  Administered 2019-03-29: 0.5 mg via ORAL
  Filled 2019-03-28: qty 1

## 2019-03-28 MED ORDER — QUETIAPINE FUMARATE 25 MG PO TABS
50.0000 mg | ORAL_TABLET | Freq: Every day | ORAL | Status: DC
  Administered 2019-03-28 – 2019-03-29 (×2): 50 mg
  Filled 2019-03-28 (×2): qty 1

## 2019-03-28 MED ORDER — LORAZEPAM 2 MG/ML IJ SOLN
0.5000 mg | INTRAMUSCULAR | Status: DC
  Administered 2019-03-28 – 2019-03-30 (×11): 0.5 mg via INTRAMUSCULAR
  Filled 2019-03-28 (×11): qty 1

## 2019-03-28 NOTE — Progress Notes (Signed)
Bilateral carotid artery duplex scan performed.  Terminated exam early due to patient becoming increasingly uncooperative and unwilling to stop moving. Patient kept trying to sit up and get out of bed.  Preliminary results can be found under CV proc under chart review.  03/28/2019 10:30 AM  Sharanya Templin, K., RDMS, RVT

## 2019-03-28 NOTE — Progress Notes (Signed)
STROKE TEAM PROGRESS NOTE   INTERVAL HISTORY Pt RN at bedside. Pt is doing well this am, awake alert, orientated to place.  The patient has been quite restless and agitated throughout the day.  He has pulled out his lines.  He was given low-dose Seroquel last night but seemingly not very effective.  Vitals:   03/28/19 0740 03/28/19 0958 03/28/19 1000 03/28/19 1241  BP: (!) 143/73 (!) 157/93  (!) 153/85  Pulse: (!) 107 (!) 111  100  Resp: 15 (!) 24 17 20   Temp: 97.8 F (36.6 C) 98.4 F (36.9 C)  98.4 F (36.9 C)  TempSrc: Oral Axillary  Oral  SpO2: 93% 97%  99%  Weight:      Height:        CBC:  Recent Labs  Lab 03/23/2019 1303 04/09/2019 1308 03/23/19 0303 03/23/19 1619 03/27/19 0457 03/28/19 0500  WBC 9.5   < > 14.3*   < > 7.9 8.7  NEUTROABS 5.0  --  10.3*  --   --   --   HGB 16.1   < > 13.6   < > 11.2* 12.0*  HCT 48.0   < > 41.1   < > 33.8* 36.1*  MCV 97.8   < > 99.3   < > 99.1 98.1  PLT 145*   < > 159   < > 120* 142*   < > = values in this interval not displayed.    Basic Metabolic Panel:  Recent Labs  Lab 03/26/19 0446 03/26/19 1706 03/27/19 0457 03/28/19 0500  NA 138   < > 145 143  K 4.6   < > 2.9* 2.8*  CL 112*   < > 106 109  CO2 19*   < > 27 22  GLUCOSE 138*   < > 120* 147*  BUN 18   < > 17 24*  CREATININE 0.91   < > 1.06 1.03  CALCIUM 7.8*   < > 8.6* 8.9  MG 1.9  --  1.9  --   PHOS 1.3*  --  1.6*  --    < > = values in this interval not displayed.   Lipid Panel:     Component Value Date/Time   CHOL 154 03/23/2019 0303   TRIG 109 03/24/2019 2100   HDL 36 (L) 03/23/2019 0303   CHOLHDL 4.3 03/23/2019 0303   VLDL 38 03/23/2019 0303   LDLCALC 80 03/23/2019 0303   HgbA1c:  Lab Results  Component Value Date   HGBA1C 5.9 (H) 03/23/2019   Urine Drug Screen: No results found for: LABOPIA, COCAINSCRNUR, LABBENZ, AMPHETMU, THCU, LABBARB  Alcohol Level No results found for: ETH  IMAGING past 48 hours VAS 05/21/2019 CAROTID  Result Date: 03/28/2019 Carotid  Arterial Duplex Study Indications:   CVA and Right stent. Other Factors: Right carotid surgery 04/05/2019. Limitations    Today's exam was limited due to the patient's inability or                unwillingness to cooperate and Dementia. Performing Technologist: 10-28-1966 RDMS, RVT  Examination Guidelines: A complete evaluation includes B-mode imaging, spectral Doppler, color Doppler, and power Doppler as needed of all accessible portions of each vessel. Bilateral testing is considered an integral part of a complete examination. Limited examinations for reoccurring indications may be performed as noted.  Right Carotid Findings: +----------+--------+--------+--------+------------------+--------+           PSV cm/sEDV cm/sStenosisPlaque DescriptionComments +----------+--------+--------+--------+------------------+--------+ CCA Prox  stent    +----------+--------+--------+--------+------------------+--------+ ICA Prox  110     18      1-39%   heterogenous               +----------+--------+--------+--------+------------------+--------+ ICA Distal109     13                                         +----------+--------+--------+--------+------------------+--------+ ECA       200     38              heterogenous               +----------+--------+--------+--------+------------------+--------+ +----------+--------+-------+----------------+-------------------+           PSV cm/sEDV cmsDescribe        Arm Pressure (mmHG) +----------+--------+-------+----------------+-------------------+ [id number]            Multiphasic, WNL                    +----------+--------+-------+----------------+-------------------+ +---------+--------+--+--------+--+---------+ VertebralPSV cm/s46EDV cm/s15Antegrade +---------+--------+--+--------+--+---------+  Right Stent(s): +---------------+--------+--------+--------+--------+--------+ CCA             PSV cm/sEDV cm/sStenosisWaveformComments +---------------+--------+--------+--------+--------+--------+ Prox to Stent  80      11                               +---------------+--------+--------+--------+--------+--------+ Proximal Stent 90      16                               +---------------+--------+--------+--------+--------+--------+ Mid Stent      100     25                               +---------------+--------+--------+--------+--------+--------+ Distal Stent   86      19                               +---------------+--------+--------+--------+--------+--------+ Distal to Stent86      35                               +---------------+--------+--------+--------+--------+--------+   Left Carotid Findings: +----------+-------+--------+--------+-----------------------+-----------------+           PSV    EDV cm/sStenosisPlaque Description     Comments                    cm/s                                                            +----------+-------+--------+--------+-----------------------+-----------------+ CCA Prox  104    21                                                       +----------+-------+--------+--------+-----------------------+-----------------+ CCA Distal91     15  intimal                                                                   thickening        +----------+-------+--------+--------+-----------------------+-----------------+ ICA Prox  281    64      60-79%  heterogenous and                                                          calcific                                 +----------+-------+--------+--------+-----------------------+-----------------+ ICA Mid   210    55                                                       +----------+-------+--------+--------+-----------------------+-----------------+ ICA Distal                                               Not assessed      +----------+-------+--------+--------+-----------------------+-----------------+ ECA       106    17                                                       +----------+-------+--------+--------+-----------------------+-----------------+ +----------+--------+--------+------------+-------------------+           PSV cm/sEDV cm/sDescribe    Arm Pressure (mmHG) +----------+--------+--------+------------+-------------------+ Subclavian                Not assessed                    +----------+--------+--------+------------+-------------------+ +---------+--------+--------+------------+ VertebralPSV cm/sEDV cm/sNot assessed +---------+--------+--------+------------+ Terminated exam early due to patient becoming increasingly uncooperative.  Summary: Right Carotid: Velocities in the right ICA are consistent with a 1-39% stenosis. Left Carotid: Velocities in the left ICA are consistent with a 60-79% stenosis.               ICA stenosis PSV in the 80-99% criteria, EDV on the higher end of               the criteria for 60-79%. Terminated exam early due to patient               becoming increasingly uncooperative. Vertebrals:  Right vertebral artery demonstrates antegrade flow. Left vertebral              artery not assessed. Subclavians: Normal flow hemodynamics were seen in the right subclavian artery.              Left subclavian artery not assessed. *See table(s) above for  measurements and observations.  Electronically signed by Sherald Hess MD on 03/28/2019 at 2:53:24 PM.    Final    Cerebral Angiogram 2019-03-25 S/P bilateral common carotid arteriograms followed by complete revascularization of RT MCA pre occlusive thrombus following balloon angioplasty with stent placement at prox RT ICA for severe pre occlusive stenosis ,and balloon angioplasty of heavily calcified plaque in proc RT ICAA with a patency  Of approx 80 % .  With TICI 3 revascularization    Carotid Dopplers 03/28/2019 Summary:  Right Carotid: Velocities in the right ICA are consistent with a 1-39% stenosis.  Left Carotid: Velocities in the left ICA are consistent with a 60-79%  stenosis.  ICA stenosis PSV in the 80-99% criteria, EDV on the higher end of the criteria for 60-79%. Terminated exam early due to patient becoming increasingly uncooperative.  Vertebrals: Right vertebral artery demonstrates antegrade flow. Left vertebral artery not assessed.  Subclavians: Normal flow hemodynamics were seen in the right subclavian artery. Left subclavian artery not assessed.   PHYSICAL EXAM   Temp:  [97.7 F (36.5 C)-98.8 F (37.1 C)] 98.4 F (36.9 C) (02/13 1241) Pulse Rate:  [85-111] 100 (02/13 1241) Resp:  [15-24] 20 (02/13 1241) BP: (130-157)/(73-95) 153/85 (02/13 1241) SpO2:  [93 %-99 %] 99 % (02/13 1241) Weight:  [86 kg] 86 kg (02/13 0500)  General - Well nourished, well developed, not in acute distress.  Ophthalmologic - fundi not visualized due to noncooperation.  Cardiovascular - irregular heart beats with tachycardia, frequent PACs on tele.  Neuro - awake alert, orientated to self age and place but not to time.  Pleasant cooperative with exam.  Still has moderate dysarthria but improved from yesterday, following all simple commands, no aphasia. Eyes open spontaneously, eyes in mid position, able to have bilateral gaze, PERRL, blinking to visual threat bilaterally. Facial symmetric.  Tongue protrusion midline. BUE 4+/5 and BLE4/5 at least. Sensation metrical, bilateral finger-to-nose intact and gait not tested.   ASSESSMENT/PLAN Mr. JARRICK FJELD is a 86 y.o. male with history of HTN presenting with slurred speech and L facial droop. Received tPA 2019/03/25 at 1324. Taken for IR for distal R M1 thrombus.   Stroke:   R MCA scattered infarcts s/p tPA and IR w/ R ICA stent placement, infarct secondary to large vessel disease source  CT head hyperdense R MCA.      CTA head & neck R MCA LVO mid and distal segment. Cervical R ICA critical stenosis w/ random string sign stenoses. L ICA distal bulb string sign, severe B VA origin stenoses   Cerebral angio TICI 3 revascularization R MCA preocclusive thrombus w/ angioplasty and stent at proximal R ICA and angioplasty of 80% plaque in L ICA.   MRI  R temporal lobe cortical/ subcortical infarct w/ numerous small scattered R brain infarcts. Proximal R M2 MCA w/ loss of signal likely residual thrombus.    2D Echo EF 45%, no SOE seen  Carotid doppler -  left ICA consistent with a 60-79%  stenosis.  LDL 80  HgbA1c 5.9  P2Y12 = 8  lovenox for VTE prophylaxis  No antithrombotic prior to admission, now on Brilinta (ticagrelor) 90 mg bid and ASA 81mg  daily.  Continue on discharge  Therapy recommendations:  CIR  Disposition:  pending   Vent dependent hypoxic respiratory failure RLL aspiration pneumonitis  intubate for IR, left intubated   Extubated 2/11   Copious secretion and SOB post extubation but stable O2 sat  Close monitoring, low threshold  for re-intubation  CXR RLL infiltrates -> Worsening opacification   On unasyn 2/9>> (2/15)  Leukocytosis WBC 11.4->9.1->7.0->7.9   Fever 101.1->99.8->afebrile->100.3->afebrile   Sputum Cx nml flora  UCx neg  Chest PT  CCM signed off  Carotid stenosis  CTA neck - bilateral ICA bulb string sign  S/p right ICA stenting  S/p left ICA angioplasty  CUS - left ICA consistent with a 60-79%  stenosis.  Consider further left ICA intervention in the near future - Dr. Corliss Skains aware  New HFrEF, ? D/t critical illness SR w/ PACs  2D Echo EF 45%, no SOE seen  Treated w/ lasix  Added metoprolol 25mg  bid  Repeat 2D as OP  On ASA and brilinta  Cardiology on board  Hx of Hypertension Hypotension d/t sedation, resolved  Home meds:  Valsartan-HCTZ 160-12.5, amlodipine 5  Off levophed gtt  BP stable . BP goal 110-180  . Long-term  BP goal normotensive  Hyperlipidemia  Home meds:  No statin  LDL 80, goal < 70  On lipitor 40   Continue statin at discharge  Dysphagia . Secondary to stroke . NPO . TF via Cortrak . Speech pending for swallow  Other Stroke Risk Factors  Advanced age  Former Cigarette smoker, quit 45 yrs ago  ETOH use, will advise to drink no more than 2 drink(s) a day   Other Active Problems  Dementia, on aricept and namenda  Urinary retention, foley placed  Thrombocytopenia - platelet 159->130->clump->120->142  Hypokalemia 4.6-2.9-2.9 - supplement - 2.8 ->supplement and recheck in AM  Hypophosphatemia 2.1-1.3-1.6 - supplement->recheck in AM  Anemia - Hb - 11.2->12.0  Agitation - Seroquel added at bedtime - will probably need a sitter - may need further sedation.  Hospital day # 6  This patient is right MCA stroke, bilateral carotid stenosis, status post TPA, and IR, respiratory failure and at significant risk of neurological worsening, death form recurrent stroke, hemorrhagic conversion, seizure, heart failure.  Persistently restless and agitated today one-to-one sitter has been ordered.  Ativan and Seroquel ordered.   To contact Stroke Continuity provider, please refer to . After hours, contact General Neurology

## 2019-03-28 NOTE — Progress Notes (Signed)
SLP Cancellation Note  Patient Details Name: Jonathan Berger MRN: [id number] DOB: 31-Dec-1931   Cancelled treatment:       Reason Eval/Treat Not Completed: (note pt with agitation and trying to get OOB- has Cortrak, will continue efforts)   Chales Abrahams 03/28/2019, 12:24 PM  Rolena Infante, MS [facility] SLP Acute Rehab Services Office [phone]

## 2019-03-28 NOTE — Progress Notes (Signed)
Vaseline gauze/gauze dressing applied to site. Clean, dry, and intact. Removed by patient per RN.

## 2019-03-28 NOTE — Progress Notes (Signed)
MD notified of patient's MEWS score due to increased HR and respirations. Patient restless, continuing to monitor patient

## 2019-03-28 NOTE — Progress Notes (Signed)
Arrived to patient's room with PICC line out. Patient restless and continually trying to get up

## 2019-03-28 NOTE — Progress Notes (Signed)
Physical Therapy Treatment Patient Details Name: Jonathan Berger MRN: [id number] DOB: 1931-12-10 Today's Date: 03/28/2019    History of Present Illness 86 year old presenting 2/7 with slurred speech and dysphagia found to have acute right middle cerebral artery stroke s/p tPA and EVR R MCA and ICA with stent on 03/29/2019.  Extubated 03/26/2019.    PT Comments    Patient progressing with mobility this session able to ambulate in the room around bed to chair.  Still unsteady and ataxic, but progressing feet on his own and maintaining upright with mod A.  Feel he should continue to progress and still feel he will benefit from CIR level rehab prior to d/c home with family A.    Follow Up Recommendations  CIR     Equipment Recommendations  Other (comment)(TBA)    Recommendations for Other Services       Precautions / Restrictions Precautions Precautions: Fall Precaution Comments: restraints    Mobility  Bed Mobility Overal bed mobility: Needs Assistance Bed Mobility: Supine to Sit     Supine to sit: Mod assist     General bed mobility comments: assist for legs off bed and to lift trunk  Transfers Overall transfer level: Needs assistance Equipment used: Rolling walker (2 wheeled);None Transfers: Sit to/from [employer] Sit to Stand: Mod assist Stand pivot transfers: Mod assist       General transfer comment: pivot to [facility] quickly due to pt incontinent of liquid stool and requesting to use 3:1; then stood from 3:1 with lifting help to RW  Ambulation/Gait Ambulation/Gait assistance: Mod assist;+2 safety/equipment Gait Distance (Feet): 12 Feet Assistive device: Rolling walker (2 wheeled) Gait Pattern/deviations: Step-through pattern;Wide base of support;Decreased stride length;Ataxic     General Gait Details: wide BOS and not staying inside RW, assist for balance, walker proximity, cues for safety with lines   Stairs             Wheelchair  Mobility    Modified Rankin (Stroke Patients Only) Modified Rankin (Stroke Patients Only) Pre-Morbid Rankin Score: No symptoms Modified Rankin: Moderately severe disability     Balance Overall balance assessment: Needs assistance   Sitting balance-Leahy Scale: Fair     Standing balance support: Bilateral upper extremity supported Standing balance-Leahy Scale: Poor Standing balance comment: UE support and min A for balance standing while assist for hygiene after toileting                            Cognition Arousal/Alertness: Awake/alert Behavior During Therapy: Impulsive Overall Cognitive Status: Impaired/Different from baseline Area of Impairment: Memory;Orientation;Attention;Following commands;Problem solving                 Orientation Level: Disoriented to;Place;Time;Situation   Memory: Decreased short-term memory;Decreased recall of precautions Following Commands: Follows one step commands consistently;Follows one step commands with increased time Safety/Judgement: Decreased awareness of safety;Decreased awareness of deficits   Problem Solving: Slow processing;Requires verbal cues;Requires tactile cues        Exercises      General Comments General comments (skin integrity, edema, etc.): Wife left prior to session, RN Aware of pt in chair with chair alarm, posey, wrist restraints and mitts bilaterally      Pertinent Vitals/Pain Pain Assessment: Faces Faces Pain Scale: No hurt    Home Living                      Prior Function  PT Goals (current goals can now be found in the care plan section) Progress towards PT goals: Progressing toward goals    Frequency    Min 4X/week      PT Plan Current plan remains appropriate    Co-evaluation              AM-PAC PT "6 Clicks" Mobility   Outcome Measure  Help needed turning from your back to your side while in a flat bed without using bedrails?: A  Little Help needed moving from lying on your back to sitting on the side of a flat bed without using bedrails?: A Lot Help needed moving to and from a bed to a chair (including a wheelchair)?: A Lot Help needed standing up from a chair using your arms (e.g., wheelchair or bedside chair)?: A Lot Help needed to walk in hospital room?: A Lot Help needed climbing 3-5 steps with a railing? : Total 6 Click Score: 12    End of Session   Activity Tolerance: Patient limited by fatigue Patient left: in chair;with call bell/phone within reach;with chair alarm set;with restraints reapplied Nurse Communication: Mobility status PT Visit Diagnosis: Other abnormalities of gait and mobility (R26.89);Muscle weakness (generalized) (M62.81);Other symptoms and signs involving the nervous system (R29.898)     Time: 1333-1401 PT Time Calculation (min) (ACUTE ONLY): 28 min  Charges:  $Gait Training: 8-22 mins $Therapeutic Activity: 8-22 mins                     Magda Kiel, PT Acute Rehabilitation Services [phone] 03/28/2019    Reginia Naas 03/28/2019, 4:21 PM

## 2019-03-29 ENCOUNTER — Inpatient Hospital Stay (HOSPITAL_COMMUNITY): Payer: Medicare Other

## 2019-03-29 DIAGNOSIS — E860 Dehydration: Secondary | ICD-10-CM | POA: Diagnosis present

## 2019-03-29 DIAGNOSIS — R000 Tachycardia, unspecified: Secondary | ICD-10-CM | POA: Diagnosis not present

## 2019-03-29 DIAGNOSIS — J690 Pneumonitis due to inhalation of food and vomit: Secondary | ICD-10-CM | POA: Diagnosis not present

## 2019-03-29 LAB — CBC
HCT: 38.4 % — ABNORMAL LOW (ref 39.0–52.0)
Hemoglobin: 12.7 g/dL — ABNORMAL LOW (ref 13.0–17.0)
MCH: 32.7 pg (ref 26.0–34.0)
MCHC: 33.1 g/dL (ref 30.0–36.0)
MCV: 99 fL (ref 80.0–100.0)
Platelets: 156 10*3/uL (ref 150–400)
RBC: 3.88 MIL/uL — ABNORMAL LOW (ref 4.22–5.81)
RDW: 14.1 % (ref 11.5–15.5)
WBC: 9.6 10*3/uL (ref 4.0–10.5)
nRBC: 0 % (ref 0.0–0.2)

## 2019-03-29 LAB — GLUCOSE, CAPILLARY
Glucose-Capillary: 112 mg/dL — ABNORMAL HIGH (ref 70–99)
Glucose-Capillary: 120 mg/dL — ABNORMAL HIGH (ref 70–99)
Glucose-Capillary: 129 mg/dL — ABNORMAL HIGH (ref 70–99)
Glucose-Capillary: 131 mg/dL — ABNORMAL HIGH (ref 70–99)
Glucose-Capillary: 114 mg/dL — ABNORMAL HIGH (ref 70–99)

## 2019-03-29 LAB — TROPONIN I (HIGH SENSITIVITY)
Troponin I (High Sensitivity): 30 ng/L — ABNORMAL HIGH (ref <18)
Troponin I (High Sensitivity): 33 ng/L — ABNORMAL HIGH (ref <18)

## 2019-03-29 LAB — BASIC METABOLIC PANEL
Anion gap: 13 (ref 5–15)
BUN: 25 mg/dL — ABNORMAL HIGH (ref 8–23)
CO2: 21 mmol/L — ABNORMAL LOW (ref 22–32)
Calcium: 9.4 mg/dL (ref 8.9–10.3)
Chloride: 115 mmol/L — ABNORMAL HIGH (ref 98–111)
Creatinine, Ser: 1.22 mg/dL (ref 0.61–1.24)
GFR calc Af Amer: >60 mL/min (ref >60)
GFR calc non Af Amer: 53 mL/min — ABNORMAL LOW (ref >60)
Glucose, Bld: 124 mg/dL — ABNORMAL HIGH (ref 70–99)
Potassium: 2.9 mmol/L — ABNORMAL LOW (ref 3.5–5.1)
Sodium: 149 mmol/L — ABNORMAL HIGH (ref 135–145)

## 2019-03-29 LAB — LACTIC ACID, PLASMA
Lactic Acid, Venous: 1.3 mmol/L (ref 0.5–1.9)
Lactic Acid, Venous: 1.4 mmol/L (ref 0.5–1.9)

## 2019-03-29 LAB — BLOOD GAS, ARTERIAL
Acid-base deficit: 1.7 mmol/L (ref 0.0–2.0)
Bicarbonate: 21.1 mmol/L (ref 20.0–28.0)
FIO2: 21
O2 Saturation: 94.8 %
Patient temperature: 37.8
pCO2 arterial: 28.4 mmHg — ABNORMAL LOW (ref 32.0–48.0)
pH, Arterial: 7.488 — ABNORMAL HIGH (ref 7.350–7.450)
pO2, Arterial: 75.4 mmHg — ABNORMAL LOW (ref 83.0–108.0)

## 2019-03-29 LAB — MAGNESIUM: Magnesium: 2.1 mg/dL (ref 1.7–2.4)

## 2019-03-29 LAB — PHOSPHORUS: Phosphorus: 1.8 mg/dL — ABNORMAL LOW (ref 2.5–4.6)

## 2019-03-29 LAB — D-DIMER, QUANTITATIVE: D-Dimer, Quant: 4.72 ug/mL-FEU — ABNORMAL HIGH (ref 0.00–0.50)

## 2019-03-29 MED ORDER — POTASSIUM CHLORIDE 20 MEQ/15ML (10%) PO SOLN
40.0000 meq | ORAL | Status: AC
  Administered 2019-03-29 (×4): 40 meq
  Filled 2019-03-29 (×5): qty 30

## 2019-03-29 MED ORDER — VANCOMYCIN HCL 1750 MG/350ML IV SOLN
1750.0000 mg | INTRAVENOUS | Status: DC
  Administered 2019-03-29: 1750 mg via INTRAVENOUS
  Filled 2019-03-29 (×2): qty 350

## 2019-03-29 MED ORDER — LACTATED RINGERS IV SOLN: INTRAVENOUS | Status: DC

## 2019-03-29 MED ORDER — METOPROLOL TARTRATE 5 MG/5ML IV SOLN
5.0000 mg | Freq: Once | INTRAVENOUS | Status: AC
  Administered 2019-03-29: 5 mg via INTRAVENOUS
  Filled 2019-03-29: qty 5

## 2019-03-29 MED ORDER — SODIUM CHLORIDE 0.9 % IV SOLN
2.0000 g | INTRAVENOUS | Status: DC
  Administered 2019-03-29: 2 g via INTRAVENOUS
  Filled 2019-03-29: qty 2

## 2019-03-29 MED ORDER — ENOXAPARIN SODIUM 40 MG/0.4ML ~~LOC~~ SOLN
40.0000 mg | SUBCUTANEOUS | Status: DC
  Administered 2019-03-29 – 2019-04-16 (×18): 40 mg via SUBCUTANEOUS
  Filled 2019-03-29 (×20): qty 0.4

## 2019-03-29 MED ORDER — VANCOMYCIN HCL IN DEXTROSE 1-5 GM/200ML-% IV SOLN: 1000.0000 mg | Freq: Once | INTRAVENOUS | Status: DC

## 2019-03-29 NOTE — Progress Notes (Addendum)
STROKE TEAM PROGRESS NOTE   INTERVAL HISTORY Pt's nurse called - resp 35 today. O2 sats fluctuating high 80's and low 90s. Check ABGs and then place on supplemental O2. CXR - d dimer/ - ECG. Pt also tachycardic 108 - 155. Fever 100.1. The patient is still agitated although the nurse reports he is much less so than he was yesterday. He was placed on Ativan and Seroquel. Antibiotics have been changed to cefepime and vancomycin per pharmacy team with concerns of sepsis. The wife is at the bedside and the case is discussed at length with her. Hospitalist has been consulted to help with her care.  Vitals:   03/28/19 1614 03/28/19 2031 03/28/19 2333 03/29/19 0748  BP: (!) 146/121 (!) 122/100 (!) 145/80 (!) 153/93  Pulse: (!) 155 (!) 112 99 (!) 108  Resp: 18 (!) 22 (!) 22 (!) 35  Temp: 98.2 F (36.8 C) 98.5 F (36.9 C) (!) 97.5 F (36.4 C) 100.1 F (37.8 C)  TempSrc: Oral Axillary Oral Axillary  SpO2: 98% 98% 92% 94%  Weight:      Height:        CBC:  Recent Labs  Lab 03/30/2019 1303 04/01/2019 1308 03/23/19 0303 03/23/19 1619 03/28/19 0500 03/29/19 0425  WBC 9.5   < > 14.3*   < > 8.7 9.6  NEUTROABS 5.0  --  10.3*  --   --   --   HGB 16.1   < > 13.6   < > 12.0* 12.7*  HCT 48.0   < > 41.1   < > 36.1* 38.4*  MCV 97.8   < > 99.3   < > 98.1 99.0  PLT 145*   < > 159   < > 142* 156   < > = values in this interval not displayed.    Basic Metabolic Panel:  Recent Labs  Lab 03/27/19 0457 03/27/19 0457 03/28/19 0500 03/29/19 0425  NA 145   < > 143 149*  K 2.9*   < > 2.8* 2.9*  CL 106   < > 109 115*  CO2 27   < > 22 21*  GLUCOSE 120*   < > 147* 124*  BUN 17   < > 24* 25*  CREATININE 1.06   < > 1.03 1.22  CALCIUM 8.6*   < > 8.9 9.4  MG 1.9  --   --  2.1  PHOS 1.6*  --   --  1.8*   < > = values in this interval not displayed.   Lipid Panel:     Component Value Date/Time   CHOL 154 03/23/2019 0303   TRIG 109 03/24/2019 2100   HDL 36 (L) 03/23/2019 0303   CHOLHDL 4.3 03/23/2019  0303   VLDL 38 03/23/2019 0303   LDLCALC 80 03/23/2019 0303   HgbA1c:  Lab Results  Component Value Date   HGBA1C 5.9 (H) 03/23/2019   Urine Drug Screen: No results found for: LABOPIA, COCAINSCRNUR, LABBENZ, AMPHETMU, THCU, LABBARB  Alcohol Level No results found for: ETH  IMAGING past 48 hours VAS US CAROTID  Result Date: 03/28/2019 Carotid Arterial Duplex Study Indications:   CVA and Right stent. Other Factors: Right carotid surgery 04/11/2019. Limitations    Today's exam was limited due to the patient's inability or                unwillingness to cooperate and Dementia. Performing Technologist: Kennedy Bucker RDMS, RVT  Examination Guidelines: A complete evaluation includes B-mode imaging, spectral Doppler,  color Doppler, and power Doppler as needed of all accessible portions of each vessel. Bilateral testing is considered an integral part of a complete examination. Limited examinations for reoccurring indications may be performed as noted.  Right Carotid Findings: +----------+--------+--------+--------+------------------+--------+           PSV cm/sEDV cm/sStenosisPlaque DescriptionComments +----------+--------+--------+--------+------------------+--------+ CCA Prox                                            stent    +----------+--------+--------+--------+------------------+--------+ ICA Prox  110     18      1-39%   heterogenous               +----------+--------+--------+--------+------------------+--------+ ICA Distal109     13                                         +----------+--------+--------+--------+------------------+--------+ ECA       200     38              heterogenous               +----------+--------+--------+--------+------------------+--------+ +----------+--------+-------+----------------+-------------------+           PSV cm/sEDV cmsDescribe        Arm Pressure (mmHG) +----------+--------+-------+----------------+-------------------+  [id number]            Multiphasic, WNL                    +----------+--------+-------+----------------+-------------------+ +---------+--------+--+--------+--+---------+ VertebralPSV cm/s46EDV cm/s15Antegrade +---------+--------+--+--------+--+---------+  Right Stent(s): +---------------+--------+--------+--------+--------+--------+ CCA            PSV cm/sEDV cm/sStenosisWaveformComments +---------------+--------+--------+--------+--------+--------+ Prox to Stent  80      11                               +---------------+--------+--------+--------+--------+--------+ Proximal Stent 90      16                               +---------------+--------+--------+--------+--------+--------+ Mid Stent      100     25                               +---------------+--------+--------+--------+--------+--------+ Distal Stent   86      19                               +---------------+--------+--------+--------+--------+--------+ Distal to Stent86      35                               +---------------+--------+--------+--------+--------+--------+   Left Carotid Findings: +----------+-------+--------+--------+-----------------------+-----------------+           PSV    EDV cm/sStenosisPlaque Description     Comments                    cm/s                                                            +----------+-------+--------+--------+-----------------------+-----------------+  CCA Prox  104    21                                                       +----------+-------+--------+--------+-----------------------+-----------------+ CCA Distal91     15                                     intimal                                                                   thickening        +----------+-------+--------+--------+-----------------------+-----------------+ ICA Prox  281    64      60-79%  heterogenous and                                                           calcific                                 +----------+-------+--------+--------+-----------------------+-----------------+ ICA Mid   210    55                                                       +----------+-------+--------+--------+-----------------------+-----------------+ ICA Distal                                              Not assessed      +----------+-------+--------+--------+-----------------------+-----------------+ ECA       106    17                                                       +----------+-------+--------+--------+-----------------------+-----------------+ +----------+--------+--------+------------+-------------------+           PSV cm/sEDV cm/sDescribe    Arm Pressure (mmHG) +----------+--------+--------+------------+-------------------+ Subclavian                Not assessed                    +----------+--------+--------+------------+-------------------+ +---------+--------+--------+------------+ VertebralPSV cm/sEDV cm/sNot assessed +---------+--------+--------+------------+ Terminated exam early due to patient becoming increasingly uncooperative.  Summary: Right Carotid: Velocities in the right ICA are consistent with a 1-39% stenosis. Left Carotid: Velocities in the left ICA are consistent with a 60-79% stenosis.               ICA stenosis PSV in the 80-99% criteria,  EDV on the higher end of               the criteria for 60-79%. Terminated exam early due to patient               becoming increasingly uncooperative. Vertebrals:  Right vertebral artery demonstrates antegrade flow. Left vertebral              artery not assessed. Subclavians: Normal flow hemodynamics were seen in the right subclavian artery.              Left subclavian artery not assessed. *See table(s) above for measurements and observations.  Electronically signed by Sherald Hess MD on 03/28/2019 at 2:53:24 PM.    Final    Cerebral  Angiogram 04/06/2019 S/P bilateral common carotid arteriograms followed by complete revascularization of RT MCA pre occlusive thrombus following balloon angioplasty with stent placement at prox RT ICA for severe pre occlusive stenosis ,and balloon angioplasty of heavily calcified plaque in proc RT ICAA with a patency  Of approx 80 % .  With TICI 3 revascularization   Carotid Dopplers 03/28/2019 Summary:  Right Carotid: Velocities in the right ICA are consistent with a 1-39% stenosis.  Left Carotid: Velocities in the left ICA are consistent with a 60-79%  stenosis.  ICA stenosis PSV in the 80-99% criteria, EDV on the higher end of the criteria for 60-79%. Terminated exam early due to patient becoming increasingly uncooperative.  Vertebrals: Right vertebral artery demonstrates antegrade flow. Left vertebral artery not assessed.  Subclavians: Normal flow hemodynamics were seen in the right subclavian artery. Left subclavian artery not assessed.   PHYSICAL EXAM   Temp:  [97.5 F (36.4 C)-100.1 F (37.8 C)] 100.1 F (37.8 C) (02/14 0748) Pulse Rate:  [99-155] 108 (02/14 0748) Resp:  [17-35] 35 (02/14 0748) BP: (122-157)/(80-121) 153/93 (02/14 0748) SpO2:  [92 %-99 %] 94 % (02/14 0748)  General - Well nourished, well developed, not in acute distress.  Ophthalmologic - fundi not visualized due to noncooperation.  Cardiovascular - irregular heart beats with tachycardia, frequent PACs on tele.  Neuro - awake alert, he is quite restless at this time. Pleasant cooperative with exam. Moderate to severely dysarthric somewhat worse than yesterday. Eyes open spontaneously, eyes in mid position, able to have bilateral gaze, PERRL, blinking to visual threat bilaterally. Facial symmetric.  Tongue protrusion midline. BUE 4+/5 and BLE4/5 at least. Sensation metrical, bilateral finger-to-nose intact and gait not tested.   ASSESSMENT/PLAN Jonathan Berger is a 86 y.o. male with history of HTN  presenting with slurred speech and L facial droop. Received tPA 03/28/2019 at 1324. Taken for IR for distal R M1 thrombus.   Stroke:   R MCA scattered infarcts s/p tPA and IR w/ R ICA stent placement, infarct secondary to large vessel disease source  CT head hyperdense R MCA.     CTA head & neck R MCA LVO mid and distal segment. Cervical R ICA critical stenosis w/ random string sign stenoses. L ICA distal bulb string sign, severe B VA origin stenoses   Cerebral angio TICI 3 revascularization R MCA preocclusive thrombus w/ angioplasty and stent at proximal R ICA and angioplasty of 80% plaque in L ICA.   MRI  R temporal lobe cortical/ subcortical infarct w/ numerous small scattered R brain infarcts. Proximal R M2 MCA w/ loss of signal likely residual thrombus.    2D Echo EF 45%, no SOE seen  Carotid doppler -  left ICA consistent  with a 60-79%  stenosis.  LDL 80  HgbA1c 5.9  P2Y12 = 8  lovenox for VTE prophylaxis  No antithrombotic prior to admission, now on Brilinta (ticagrelor) 90 mg bid and ASA 81mg  daily.  Continue on discharge  Therapy recommendations:  CIR  Disposition:  pending   Vent dependent hypoxic respiratory failure RLL aspiration pneumonitis  intubate for IR, left intubated   Extubated 2/11   Copious secretion and SOB post extubation but stable O2 sat  Close monitoring, low threshold for re-intubation  CXR RLL infiltrates -> Worsening opacification   On unasyn 2/9>> (2/15)  Leukocytosis WBC 11.4->9.1->7.0->7.9->9.6   Fever 101.1->99.8->afebrile->100.3->afebrile->100.1  Sputum Cx nml flora  UCx neg  Chest PT  CCM signed off  Carotid stenosis  CTA neck - bilateral ICA bulb string sign  S/p right ICA stenting  S/p left ICA angioplasty  CUS - left ICA consistent with a 60-79%  stenosis.  Consider further left ICA intervention in the near future - Dr. 11-22-1995 aware  New HFrEF, ? D/t critical illness SR w/ PACs  2D Echo EF 45%, no SOE  seen  Treated w/ lasix  Added metoprolol 25mg  bid  Repeat 2D as OP  On ASA and brilinta  Cardiology on board  Hx of Hypertension Hypotension d/t sedation, resolved  Home meds:  Valsartan-HCTZ 160-12.5, amlodipine 5  Off levophed gtt  BP stable . BP goal 110-180  . Long-term BP goal normotensive  Hyperlipidemia  Home meds:  No statin  LDL 80, goal < 70  On lipitor 40   Continue statin at discharge  Dysphagia . Secondary to stroke . NPO . TF via Cortrak . Speech pending for swallow  Tachypnea / Tachycardia / Low O2 sats / Fever - R/O pneumonia ; R/O CHF ; R/O PE ; R/O MI  CXR - Changes of multifocal infiltrate  ECG - Afib with RVR rate 157  D dimer - 4.72 (H)      ABGs -  PH - 7.488   PCO2 - 28.4   PO2 - 75.4  Bic - 21.1  O2 Sat 94.8  Troponins - 30->  Supplemental O2 once abgs have been drawn   Pt now in atrial fibrillation w RVR per nurse - new diagnosis ->5 mg IV metoprolol  Have asked Dr Corliss Skains from hospitalists service to consult  Need to consider anticoagulation if documented afib and embolic stroke. I think ECG with rate of 157 is atrial fibrillation. May need Cardiology reading.   Other Stroke Risk Factors  Advanced age  Former Cigarette smoker, quit 45 yrs ago  ETOH use, will advise to drink no more than 2 drink(s) a day   Other Active Problems  Dementia, on aricept and namenda  Urinary retention, foley placed  Thrombocytopenia - platelet 159->130->clump->120->142->156  Hypokalemia 4.6-2.9-2.9 - supplement - 2.8 ->supplement and recheck in AM->2.9->supplement and recheck in AM  Hypophosphatemia 2.1-1.3-1.6 - supplement->recheck in AM->1.8  Anemia - Hb - 11.2->12.0->12.7  Agitation - Seroquel added at bedtime - will probably need a sitter - may need further sedation->ativan added  Need to consider anticoagulation if documented afib and embolic stroke. I think ECG with rate of 157 is atrial fibrillation. May need Cardiology  reading.  Nurses request TF to be changed to bolus feeds. Will have dietician see for recommendations.  Pt has also has NSVT on telemetry. Need to correct electrolytes.  Appreciate assistance from Dr and Rapid response.  Hospital day # 7  It appears she has developed tachycardia  and tachypnea with what appears to be new onset atrial fibrillation. This patient is right MCA stroke, bilateral carotid stenosis, status post TPA, and IR, respiratory failure and at significant risk of neurological worsening, death form recurrent stroke, hemorrhagic conversion, seizure, heart failure.     To contact Stroke Continuity provider, please refer to [email or web address]. After hours, contact General Neurology

## 2019-03-29 NOTE — Progress Notes (Signed)
Pharmacy Antibiotic Note  Jonathan Berger is a 86 y.o. male admitted on 03/24/2019 with aspiration pneumonia on Unasyn. He was noted today with decreased status and and dried bloody secretions (CXR with multiple infiltrates). Antibiotics to change to cefepime and vancomycin   -WBC= 9.6, afeb   Plan: -Cefepime 2gm IV q24h -Vancomycin 1750 mg IV Q48hr (estimated AUC= 491 using SCr= 1.2) -Will follow renal function, cultures and clinical progress   Height: 5\' 11"  (180.3 cm) Weight: 189 lb 9.5 oz (86 kg)(Simultaneous filing. User may not have seen previous data.) IBW/kg (Calculated) : 75.3  Temp (24hrs), Avg:98.7 F (37.1 C), Min:97.5 F (36.4 C), Max:100.1 F (37.8 C)  Recent Labs  Lab 03/25/19 0507 03/25/19 0507 03/26/19 0446 03/26/19 1706 03/27/19 0457 03/28/19 0500 03/29/19 0425  WBC 9.1  --  7.0  --  7.9 8.7 9.6  CREATININE 1.32*   < > 0.91 1.17 1.06 1.03 1.22   < > = values in this interval not displayed.    Estimated Creatinine Clearance: 46.3 mL/min (by C-G formula based on SCr of 1.22 mg/dL).    No Known Allergies  Antimicrobials this admission: Unasyn 2/9 >> 2/14 Vanc 2/14>> Cefepme 2/14>>  Microbiology results: 2/8 Sputum: nf 2/8 MRSA PCR: negative 2/8 UC - neg   4/8, PharmD Clinical Pharmacist **Pharmacist phone directory can now be found on [email or web address] ([email or web address]).  Listed under [facility].

## 2019-03-29 NOTE — Significant Event (Signed)
Rapid Response Event Note  Overview: Time Called: 0840 Event Type: Respiratory  Initial Focused Assessment: Patient with decreased mental status this am. Has received seroquel qhs and ativan q 4 hours since yesterday at 5pm. He is tachypnic and sounds congested. BP 177/88  HR 110-150s  RR 30s  O2 sat 89-97% on RA Very dry mouth  Interventions: ABG done Oral suction set up Repeated oral care, used 4 oral care kits. Removed moderate to large about of dried bloody secretions. More awake and able to cough to command, but does not cough secretions up PCXR done Labs drawn  5 mg Lopressor given IV AM meds given via tube: 0.5 Ativan, 25 mg Lopressor, 40 meq KCL,  90mg  brilinta  BP 132/84  HR 97  RR 36  O2 sat 89-94% on RA  Plan of Care (if not transferred):  Event Summary: Name of Physician Notified: David Rinehuls at 0900    at    Outcome: Stayed in room and stabalized  Event End Time: 1015  

## 2019-03-29 NOTE — Progress Notes (Signed)
RT NOTE: Patient unable to preform CPT flutter this morning due to increased respirations. Rapid response made aware and visited patient. ABG obtained. RT will attempt at next scheduled time. RT will continue to monitor as needed.

## 2019-03-29 NOTE — Progress Notes (Signed)
Patient's speech incomprehensible, unable to follow commands. Restless in bed. Continuing to suction a moderate amount of thin bloody secretions from mouth. Onalee Hua Rinehuls aware (paged). Continuous tube feedings paused due to patient being unable to maintain position >30 degrees. Rinehuls states a dietician will consult.

## 2019-03-29 NOTE — Progress Notes (Signed)
Paged Rinehuls regarding patient's respirations of 30 and HR 150. Received order for metoprolol to control HR. Suctioned bloody secretions from patient's mouth. Pt on 2LO2 and HOB>30.

## 2019-03-29 NOTE — Consult Note (Signed)
Medical Consultation   Jonathan Berger  FUX:[id number]  DOB: 06/19/1931  DOA: 03/21/2019  PCP: Marden Noble, MD   Outpatient Specialists: None    Requesting physician: Stroke service  Reason for consultation: CVA s/p tPA and thrombectomy.  He is waiting for rehab.  Has dementia, confused yesterday and had to be restrained.  Today, nurse was concerned about tachypnea, tachycardia.  ABG ordered, pO2 in the 70s (?chronic).  Being treated for aspiration PNA, on Unasyn.  EKG with afib with RVR - ?new.  CXR pending.  D-dimer, troponin ordered.  Placed on O2.  Also with new fever this AM, 100.1.  History of Present Illness: Jonathan Berger is an 86 y.o. male with h/o HTN who was admitted by Dr. Otelia Limes on 2/7 for dense R ischemic MCA CVA, s/p tPA and VIR intervention with ballooon angioplasty and stent placement at proximal R ICA; he may require eventual L ICA intervention.  He has progressed but has ongoing dysarthria and dysphagia with Cortrak placement.  He developed RLL aspiration pneumonitis after extubation on 2/11.  He was found to have new systolic CHF with EF 45%.  The patient appears to be pretty clearly delirious at this time.  He is wearing mittens and attempting to get out of bed.  His speech is unintelligible.      Review of Systems:  ROS As per HPI otherwise 10 point review of systems negative.    Past Medical History: Past Medical History:  Diagnosis Date  . Anemia   . Chronic back pain    herniated disc  . Hepatitis    50+yrs ago  . Hypertension    takes Amlodipine/Valsartan/HCTZ daily  . Personal history of kidney stones     Past Surgical History: Past Surgical History:  Procedure Laterality Date  . BACK SURGERY  1977/006  . COLONOSCOPY    . CYSTOSCOPY    . ESOPHAGOGASTRODUODENOSCOPY    . IR ANGIO INTRA EXTRACRAN SEL COM CAROTID INNOMINATE UNI L MOD SED  03/23/2019  . IR ANGIO VERTEBRAL SEL SUBCLAVIAN INNOMINATE UNI L MOD SED  03/18/2019  .  IR CT HEAD LTD  04/02/2019  . IR ENDOVASC INTRACRANIAL INF OTHER THAN THROMBO ART INC DIAG ANGIO  03/30/2019  . IR INTRAVSC STENT CERV CAROTID W/O EMB-PROT MOD SED INC ANGIO  04/05/2019  . LITHOTRIPSY    . LUMBAR LAMINECTOMY  01/29/2011   Procedure: MICRODISCECTOMY LUMBAR LAMINECTOMY;  Surgeon: Eldred Manges;  Location: [facility] OR;  Service: Orthopedics;  Laterality: Right;  Right L4-5 Microdiscectomy  . RADIOLOGY WITH ANESTHESIA N/A 03/29/2019   Procedure: RADIOLOGY WITH ANESTHESIA;  Surgeon: Julieanne Cotton, MD;  Location: [facility] OR;  Service: Radiology;  Laterality: N/A;     Allergies:  No Known Allergies   Social History:  reports that he quit smoking about 45 years ago. His smoking use included cigarettes. He has a 7.50 pack-year smoking history. He has never used smokeless tobacco. He reports current alcohol use. He reports that he does not use drugs.   Family History: Family History  Problem Relation Age of Onset  . Anesthesia problems Neg Hx   . Hypotension Neg Hx   . Malignant hyperthermia Neg Hx   . Pseudochol deficiency Neg Hx       Physical Exam: Vitals:   03/29/19 0748 03/29/19 0900 03/29/19 1000 03/29/19 1011  BP: (!) 153/93   132/84  Pulse: (!) 108  92  Resp: (!) 35 (!) 28 (!) 26 (!) 36  Temp: 100.1 F (37.8 C)   99.2 F (37.3 C)  TempSrc: Axillary   Oral  SpO2: 94%   90%  Weight:      Height:        Constitutional: Alert and awake, mildly agitated. Eyes:  EOMI, irises appear normal, anicteric sclera ENMT: external ears and nose appear normal, dry mucus membranes Neck: neck appears normal, no masses, normal ROM, no thyromegaly, no JVD  CVS: mild tachycardia, irregular, no LE edema, normal pedal pulses  Respiratory:  Mild diffuse rhonchi. Respiratory effort increased with tachypnea Abdomen: soft nontender, nondistended Musculoskeletal: : no cyanosis, clubbing or edema noted bilaterally Psych: fidgety, appears confused, unintelligible speech Skin: no rashes or  lesions or ulcers, no induration or nodules    Data reviewed:  I have personally reviewed the recent labs and imaging studies  Pertinent Labs:   Na++ 149; 143 on 2/19 K+ 2.9 CO2 21 Glucose 124 BUN 25/Creatinine 1.22/GFR 53; 24/1.03/>60 on 2/13 Phos 1.8 WBC 9.6 Hgb 12.7; 12.0 on 2/13 ABG: 7.48/28.4/75.4   Inpatient Medications:   Scheduled Meds: . aspirin  81 mg Per Tube Daily  . atorvastatin  40 mg Per Tube q1800  . chlorhexidine  15 mL Mouth Rinse BID  . Chlorhexidine Gluconate Cloth  6 each Topical Daily  . donepezil  5 mg Per Tube QHS  . labetalol  20 mg Intravenous Once  . LORazepam  0.5 mg Oral Q4H   Or  . LORazepam  0.5 mg Intramuscular Q4H  . mouth rinse  15 mL Mouth Rinse q12n4p  . metoprolol tartrate  25 mg Per Tube BID  . potassium chloride  40 mEq Per Tube Q4H  . QUEtiapine  50 mg Per Tube QHS  . sodium chloride flush  10-40 mL Intracatheter Q12H  . ticagrelor  90 mg Oral BID   Or  . ticagrelor  90 mg Per Tube BID   Continuous Infusions: . sodium chloride    . ampicillin-sulbactam (UNASYN) IV 1.5 g (03/29/19 0423)  . clevidipine    . feeding supplement (VITAL AF 1.2 CAL) 1,000 mL (03/28/19 5427)     Radiological Exams on Admission: DG Chest Port 1 View  Result Date: 03/29/2019 CLINICAL DATA:  Acute respiratory distress EXAM: PORTABLE CHEST 1 VIEW COMPARISON:  03/26/2019 FINDINGS: Cardiac shadow remains enlarged. Endotracheal tube has been removed. Feeding catheter is noted extending into the stomach. The right-sided PICC line is been removed as well. Patchy opacities are noted within both lungs worse on the right than the left consistent with multifocal pneumonia. No sizable effusion is noted. IMPRESSION: Changes of multifocal infiltrate. Electronically Signed   By: Inez Catalina M.D.   On: 03/29/2019 09:44   VAS US CAROTID  Result Date: 03/28/2019 Carotid Arterial Duplex Study Indications:   CVA and Right stent. Other Factors: Right carotid surgery  04/05/2019. Limitations    Today's exam was limited due to the patient's inability or                unwillingness to cooperate and Dementia. Performing Technologist: Baldwin Crown RDMS, RVT  Examination Guidelines: A complete evaluation includes B-mode imaging, spectral Doppler, color Doppler, and power Doppler as needed of all accessible portions of each vessel. Bilateral testing is considered an integral part of a complete examination. Limited examinations for reoccurring indications may be performed as noted.  Right Carotid Findings: +----------+--------+--------+--------+------------------+--------+  PSV cm/sEDV cm/sStenosisPlaque DescriptionComments +----------+--------+--------+--------+------------------+--------+ CCA Prox                                            stent    +----------+--------+--------+--------+------------------+--------+ ICA Prox  110     18      1-39%   heterogenous               +----------+--------+--------+--------+------------------+--------+ ICA Distal109     13                                         +----------+--------+--------+--------+------------------+--------+ ECA       200     38              heterogenous               +----------+--------+--------+--------+------------------+--------+ +----------+--------+-------+----------------+-------------------+           PSV cm/sEDV cmsDescribe        Arm Pressure (mmHG) +----------+--------+-------+----------------+-------------------+ [id number]            Multiphasic, WNL                    +----------+--------+-------+----------------+-------------------+ +---------+--------+--+--------+--+---------+ VertebralPSV cm/s46EDV cm/s15Antegrade +---------+--------+--+--------+--+---------+  Right Stent(s): +---------------+--------+--------+--------+--------+--------+ CCA            PSV cm/sEDV cm/sStenosisWaveformComments  +---------------+--------+--------+--------+--------+--------+ Prox to Stent  80      11                               +---------------+--------+--------+--------+--------+--------+ Proximal Stent 90      16                               +---------------+--------+--------+--------+--------+--------+ Mid Stent      100     25                               +---------------+--------+--------+--------+--------+--------+ Distal Stent   86      19                               +---------------+--------+--------+--------+--------+--------+ Distal to Stent86      35                               +---------------+--------+--------+--------+--------+--------+   Left Carotid Findings: +----------+-------+--------+--------+-----------------------+-----------------+           PSV    EDV cm/sStenosisPlaque Description     Comments                    cm/s                                                            +----------+-------+--------+--------+-----------------------+-----------------+ CCA Prox  104    21                                                       +----------+-------+--------+--------+-----------------------+-----------------+  CCA Distal91     15                                     intimal                                                                   thickening        +----------+-------+--------+--------+-----------------------+-----------------+ ICA Prox  281    64      60-79%  heterogenous and                                                          calcific                                 +----------+-------+--------+--------+-----------------------+-----------------+ ICA Mid   210    55                                                       +----------+-------+--------+--------+-----------------------+-----------------+ ICA Distal                                              Not assessed       +----------+-------+--------+--------+-----------------------+-----------------+ ECA       106    17                                                       +----------+-------+--------+--------+-----------------------+-----------------+ +----------+--------+--------+------------+-------------------+           PSV cm/sEDV cm/sDescribe    Arm Pressure (mmHG) +----------+--------+--------+------------+-------------------+ Subclavian                Not assessed                    +----------+--------+--------+------------+-------------------+ +---------+--------+--------+------------+ VertebralPSV cm/sEDV cm/sNot assessed +---------+--------+--------+------------+ Terminated exam early due to patient becoming increasingly uncooperative.  Summary: Right Carotid: Velocities in the right ICA are consistent with a 1-39% stenosis. Left Carotid: Velocities in the left ICA are consistent with a 60-79% stenosis.               ICA stenosis PSV in the 80-99% criteria, EDV on the higher end of               the criteria for 60-79%. Terminated exam early due to patient               becoming increasingly uncooperative. Vertebrals:  Right vertebral artery demonstrates antegrade flow. Left vertebral  artery not assessed. Subclavians: Normal flow hemodynamics were seen in the right subclavian artery.              Left subclavian artery not assessed. *See table(s) above for measurements and observations.  Electronically signed by Sherald Hess MD on 03/28/2019 at 2:53:24 PM.    Final     Impression/Recommendations Principal Problem:   Middle cerebral artery embolism, right Active Problems:   Acute on chronic respiratory failure with hypoxia (HCC)   Systolic dysfunction   Dehydration   Aspiration pneumonia of right lower lobe due to gastric secretions (HCC)   Tachycardia  CVA -Patient was admitted for a hyperdense R MCA CVA, treated with tPA and IR intervention -He has been alert and  oriented to place as of yesterday but has been restless and agitated, pulling out lines -Started on ASA and Brilinta  Acute on chronic respiratory failure associated with aspiration PNA -Patient had vent-dependent respiratory failure at admission, intubated on 2/7 and was extubated on 2/11 -He had CXR with RLL infiltrates and worsening opacification concerning for aspiration and was started on Unasyn on 2/9 -CXR now indicates multifocal infiltrate, R > L -He needs broadening of his antibiotic coverage  -With tachycardia and tachypnea, this raises concern for sepsis; will add sepsis order set including lactate -He has persistent dysphagia and Cortrak is in place -Would suggest holding tube feeds for now in case he is continuing to aspirate  -Awaiting ST evaluation, but currently unable due to delirium  Dehydration -Possibly related to infection, patient appears dry on exam and by labs -Will start fluids, bolus if needed for lactate and/or BP (no current indication)  Tachycardia -He has had tachycardia and irregular beats on tele -One EKG today appeared to show afib with RVR, but the other EKG and telemetry both appear to show sinus arrhythmia with tachycardia -Will repeat EKG - appears to show NSR with PVCs -Certainly, uncontrolled PAF at home may have led to a CVA -He was seen by cardiology on 2/10, Dr. Bjorn Pippin; EKGs and telemetry were reviewed with NSR with frequent ectopy/NSVT - no evidence of clear afib -Beta blocker was recommended -Heparin was stopped as a result of no clear afib -He was recommended for possible outpatient heart monitor  Delirium -He has been agitated and pulling out lines for several days -He was given Seroquel on 2/12 without significant improvement yesterday AM -Delirium may be associated with sepsis, multifocal PNA -However, given hospital course with persistent delirium in conjunction with concerns for inability to safely receive even tube feed nutrition,  goals of care discussion with his wife may be reasonable - will defer to primary service at this time  Combined CHF -Echo showed EF 45% and grade 1 diastolic dysfunction -Possibly critical illness-associated vs. New-onset CHF -metoprolol was added, currently is prn  HTN -h/o HTN, on Diovan HCT and Norvasc at home -He required pressors early in his hospital course but weaned off -His long-term BP goal is normotensive and he is there currently   Thank you for this consultation.  Our [facility] hospitalist team will follow the patient with you.   Time Spent: 75 minutes  Jonah Blue M.D. [facility] 03/29/2019, 11:07 AM

## 2019-03-30 ENCOUNTER — Inpatient Hospital Stay (HOSPITAL_COMMUNITY): Payer: Medicare Other

## 2019-03-30 DIAGNOSIS — R0902 Hypoxemia: Secondary | ICD-10-CM

## 2019-03-30 DIAGNOSIS — J690 Pneumonitis due to inhalation of food and vomit: Secondary | ICD-10-CM

## 2019-03-30 DIAGNOSIS — Z978 Presence of other specified devices: Secondary | ICD-10-CM

## 2019-03-30 DIAGNOSIS — I471 Supraventricular tachycardia: Secondary | ICD-10-CM

## 2019-03-30 DIAGNOSIS — Z9911 Dependence on respirator [ventilator] status: Secondary | ICD-10-CM

## 2019-03-30 DIAGNOSIS — I480 Paroxysmal atrial fibrillation: Secondary | ICD-10-CM

## 2019-03-30 DIAGNOSIS — I63233 Cerebral infarction due to unspecified occlusion or stenosis of bilateral carotid arteries: Secondary | ICD-10-CM

## 2019-03-30 DIAGNOSIS — R000 Tachycardia, unspecified: Secondary | ICD-10-CM

## 2019-03-30 DIAGNOSIS — I519 Heart disease, unspecified: Secondary | ICD-10-CM

## 2019-03-30 DIAGNOSIS — R0603 Acute respiratory distress: Secondary | ICD-10-CM

## 2019-03-30 LAB — CBC
HCT: 39.4 % (ref 39.0–52.0)
Hemoglobin: 12.6 g/dL — ABNORMAL LOW (ref 13.0–17.0)
MCH: 32.9 pg (ref 26.0–34.0)
MCHC: 32 g/dL (ref 30.0–36.0)
MCV: 102.9 fL — ABNORMAL HIGH (ref 80.0–100.0)
Platelets: 193 10*3/uL (ref 150–400)
RBC: 3.83 MIL/uL — ABNORMAL LOW (ref 4.22–5.81)
RDW: 14.5 % (ref 11.5–15.5)
WBC: 9.4 10*3/uL (ref 4.0–10.5)
nRBC: 0 % (ref 0.0–0.2)

## 2019-03-30 LAB — POCT I-STAT 7, (LYTES, BLD GAS, ICA,H+H)
Acid-base deficit: 4 mmol/L — ABNORMAL HIGH (ref 0.0–2.0)
Bicarbonate: 19.7 mmol/L — ABNORMAL LOW (ref 20.0–28.0)
Calcium, Ion: 1.37 mmol/L (ref 1.15–1.40)
HCT: 35 % — ABNORMAL LOW (ref 39.0–52.0)
Hemoglobin: 11.9 g/dL — ABNORMAL LOW (ref 13.0–17.0)
O2 Saturation: 98 %
Patient temperature: 99.2
Potassium: 3.7 mmol/L (ref 3.5–5.1)
Sodium: 153 mmol/L — ABNORMAL HIGH (ref 135–145)
TCO2: 21 mmol/L — ABNORMAL LOW (ref 22–32)
pCO2 arterial: 30.9 mmHg — ABNORMAL LOW (ref 32.0–48.0)
pH, Arterial: 7.413 (ref 7.350–7.450)
pO2, Arterial: 101 mmHg (ref 83.0–108.0)

## 2019-03-30 LAB — HEMOGLOBIN AND HEMATOCRIT, BLOOD
HCT: 41.9 % (ref 39.0–52.0)
HCT: 42.4 % (ref 39.0–52.0)
Hemoglobin: 13.4 g/dL (ref 13.0–17.0)
Hemoglobin: 13.1 g/dL (ref 13.0–17.0)

## 2019-03-30 LAB — GLUCOSE, CAPILLARY
Glucose-Capillary: 106 mg/dL — ABNORMAL HIGH (ref 70–99)
Glucose-Capillary: 115 mg/dL — ABNORMAL HIGH (ref 70–99)
Glucose-Capillary: 116 mg/dL — ABNORMAL HIGH (ref 70–99)
Glucose-Capillary: 122 mg/dL — ABNORMAL HIGH (ref 70–99)
Glucose-Capillary: 146 mg/dL — ABNORMAL HIGH (ref 70–99)

## 2019-03-30 LAB — BASIC METABOLIC PANEL
Anion gap: 14 (ref 5–15)
BUN: 26 mg/dL — ABNORMAL HIGH (ref 8–23)
CO2: 19 mmol/L — ABNORMAL LOW (ref 22–32)
Calcium: 9.3 mg/dL (ref 8.9–10.3)
Chloride: 121 mmol/L — ABNORMAL HIGH (ref 98–111)
Creatinine, Ser: 1.27 mg/dL — ABNORMAL HIGH (ref 0.61–1.24)
GFR calc Af Amer: 59 mL/min — ABNORMAL LOW (ref >60)
GFR calc non Af Amer: 51 mL/min — ABNORMAL LOW (ref >60)
Glucose, Bld: 120 mg/dL — ABNORMAL HIGH (ref 70–99)
Potassium: 3.7 mmol/L (ref 3.5–5.1)
Sodium: 154 mmol/L — ABNORMAL HIGH (ref 135–145)

## 2019-03-30 MED ORDER — MIDAZOLAM HCL 2 MG/2ML IJ SOLN
2.0000 mg | Freq: Once | INTRAMUSCULAR | Status: AC
  Administered 2019-03-30: 17:00:00 2 mg via INTRAVENOUS

## 2019-03-30 MED ORDER — PHENYLEPHRINE HCL-NACL 10-0.9 MG/250ML-% IV SOLN
INTRAVENOUS | Status: AC
  Administered 2019-03-30: 10 mg
  Filled 2019-03-30: qty 250

## 2019-03-30 MED ORDER — FENTANYL CITRATE (PF) 100 MCG/2ML IJ SOLN: 25.0000 ug | INTRAMUSCULAR | Status: DC | PRN

## 2019-03-30 MED ORDER — ETOMIDATE 2 MG/ML IV SOLN
20.0000 mg | Freq: Once | INTRAVENOUS | Status: AC
  Administered 2019-03-30: 17:00:00 20 mg via INTRAVENOUS

## 2019-03-30 MED ORDER — SODIUM CHLORIDE 0.9 % IV SOLN
2.0000 g | Freq: Two times a day (BID) | INTRAVENOUS | Status: AC
  Administered 2019-03-30 – 2019-04-04 (×12): 2 g via INTRAVENOUS
  Filled 2019-03-30 (×12): qty 2

## 2019-03-30 MED ORDER — PRO-STAT SUGAR FREE PO LIQD
30.0000 mL | Freq: Three times a day (TID) | ORAL | Status: DC
  Filled 2019-03-30 (×2): qty 30

## 2019-03-30 MED ORDER — PROPOFOL 1000 MG/100ML IV EMUL: 0.0000 ug/kg/min | INTRAVENOUS | Status: DC

## 2019-03-30 MED ORDER — MIDAZOLAM HCL 2 MG/2ML IJ SOLN
1.0000 mg | INTRAMUSCULAR | Status: DC | PRN
  Administered 2019-03-30: 20:00:00 2 mg via INTRAVENOUS
  Administered 2019-03-31 (×3): 1 mg via INTRAVENOUS
  Administered 2019-03-31: 05:00:00 2 mg via INTRAVENOUS
  Filled 2019-03-30 (×4): qty 2

## 2019-03-30 MED ORDER — PROPOFOL 1000 MG/100ML IV EMUL
INTRAVENOUS | Status: AC
  Administered 2019-03-30: 30 ug/kg/min via INTRAVENOUS
  Filled 2019-03-30: qty 100

## 2019-03-30 MED ORDER — SODIUM CHLORIDE 0.9 % IV SOLN
80.0000 mg | Freq: Once | INTRAVENOUS | Status: DC
  Filled 2019-03-30: qty 80

## 2019-03-30 MED ORDER — PANTOPRAZOLE SODIUM 40 MG IV SOLR: 40.0000 mg | Freq: Two times a day (BID) | INTRAVENOUS | Status: DC

## 2019-03-30 MED ORDER — SODIUM CHLORIDE 0.9 % IV SOLN
8.0000 mg/h | INTRAVENOUS | Status: DC
  Administered 2019-03-31: 8 mg/h via INTRAVENOUS
  Filled 2019-03-30: qty 80

## 2019-03-30 MED ORDER — FENTANYL CITRATE (PF) 100 MCG/2ML IJ SOLN
100.0000 ug | Freq: Once | INTRAMUSCULAR | Status: DC
  Filled 2019-03-30: qty 2

## 2019-03-30 MED ORDER — ROCURONIUM BROMIDE 50 MG/5ML IV SOLN
50.0000 mg | Freq: Once | INTRAVENOUS | Status: AC
  Administered 2019-03-30: 50 mg via INTRAVENOUS
  Filled 2019-03-30: qty 5

## 2019-03-30 MED ORDER — PHENYLEPHRINE HCL-NACL 10-0.9 MG/250ML-% IV SOLN
25.0000 ug/min | INTRAVENOUS | Status: DC
  Administered 2019-03-30: 25 ug/min via INTRAVENOUS
  Administered 2019-03-31: 50 ug/min via INTRAVENOUS
  Administered 2019-03-31: 70 ug/min via INTRAVENOUS
  Administered 2019-03-31: 13:00:00 25 ug/min via INTRAVENOUS
  Filled 2019-03-30 (×3): qty 250
  Filled 2019-03-30: qty 500

## 2019-03-30 MED ORDER — DEXTROSE 5 % IV SOLN: INTRAVENOUS | Status: DC

## 2019-03-30 MED ORDER — FENTANYL 2500MCG IN NS 250ML (10MCG/ML) PREMIX INFUSION
0.0000 ug/h | INTRAVENOUS | Status: DC
  Administered 2019-03-30: 19:00:00 25 ug/h via INTRAVENOUS
  Administered 2019-03-31: 100 ug/h via INTRAVENOUS
  Filled 2019-03-30 (×2): qty 250

## 2019-03-30 MED ORDER — FREE WATER
300.0000 mL | Freq: Three times a day (TID) | Status: DC
  Administered 2019-03-30 – 2019-03-31 (×2): 300 mL

## 2019-03-30 MED ORDER — OSMOLITE 1.5 CAL PO LIQD
237.0000 mL | Freq: Every day | ORAL | Status: DC
  Administered 2019-03-30 – 2019-03-31 (×4): 237 mL
  Filled 2019-03-30 (×8): qty 237

## 2019-03-30 MED ORDER — WHITE PETROLATUM EX OINT
TOPICAL_OINTMENT | CUTANEOUS | Status: AC
  Filled 2019-03-30: qty 28.35

## 2019-03-30 MED ORDER — SODIUM CHLORIDE 0.9 % IV SOLN
250.0000 mL | INTRAVENOUS | Status: DC
  Administered 2019-03-31 – 2019-04-01 (×2): 250 mL via INTRAVENOUS

## 2019-03-30 MED ORDER — FENTANYL CITRATE (PF) 100 MCG/2ML IJ SOLN
25.0000 ug | INTRAMUSCULAR | Status: DC | PRN
  Administered 2019-03-31: 25 ug via INTRAVENOUS

## 2019-03-30 NOTE — Progress Notes (Signed)
SLP Cancellation Note  Patient Details Name: Jonathan Berger MRN: [id number] DOB: 08-Mar-1931   Cancelled treatment:       Reason Eval/Treat Not Completed: Medical issues which prohibited therapy; pt's MS prohibits PO trials.  SLP will follow for readiness.  Has cortrak.    [name] 03/30/2019, 2:04 PM

## 2019-03-30 NOTE — Progress Notes (Addendum)
[facility]  PROGRESS NOTE  Jonathan Berger DTO:[id number] DOB: 1931-12-25 DOA: 04/13/2019 PCP: Marden Noble, MD   Brief HPI:   86 yr old male with history of hypertension, who was admitted by Dr. Otelia Limes on 04-13-19 for dense right ischemic MCA CVA, s/p TPA and VIR intervention with balloon angioplasty and stent placement at proximal R ICA.  He may require eventual left ICA intervention.  He has progressed but has ongoing dysarthria and dysphagia with core track placement.  He developed right lower lobe aspiration pneumonitis after extubation on 03/26/2019.  He was found to have new systolic CHF with EF 45%.    Subjective   Patient is delirious, tachypneic.  He was started on Ativan IV 0.5 mg every 4 hours by neurology on 03/28/2019 for agitation.   Assessment/Plan:     1. Acute on chronic respiratory failure-secondary to aspiration pneumonia, patient had vent dependent respiratory failure admission, he was intubated on 04-13-2019 and extubated on 03/26/2019.  He had chest x-ray with RLL infiltrates with worsening opacification concerning for aspiration.  He was started on Unasyn on 03/24/2019.  Stopped on 03/29/2019.  He has been started on vancomycin and cefepime.  Chest x-ray now indicates multifocal infiltrate right more than left.  Patient has persistent dysphagia with core track in place, tube feeds are on hold as he continues to aspirate.  Speech therapy evaluation should be considered once patient is out of delirium. 2. Delirium-multifactorial, including acute on chronic hypoxic respiratory failure as above, patient was started on  Seroquel and scheduled Ativan 0.5 mg IV every 4 hours from 03/28/2019.  It was started by neurology.  Will hold Ativan at this time.  I discussed with neurology, Dr. Roda Shutters, regarding obtaining palliative care consult  with patient's family. 3. ?  Atrial fibrillation-patient has been having episodes of intermittent A. fib with RVR, cardiology was already consulted  and deemed that it was not A. fib but sinus arrhythmia.  Discussed with Dr. Raynald Kemp, who recommends to reconsult cardiology.  Will consult cardiology again for A. fib.   4. Combined systolic and diastolic CHF-patient's echocardiogram showed EF 45% with grade 1 diastolic dysfunction.  Will obtain BNP.  Chest x-ray shows pneumonia.  Does not appear to be volume overload at this time. 5. CVA-patient admitted for hyperdense R MCA CVA treated with TPA and IR intervention.  Started on aspirin and Brilinta. 6. History of hypertension-patient has history of hypertension, he was on Diovan HCT and Norvasc at home.  He required pressors during the hospital course but weaned off.  Blood pressure is stable at this time. 7. Hypernatremia-patient sodium is elevated 154, worse from yesterday.  Also has worsening renal function.  Will start D5W at 75 mill per hour.  Follow serum sodium in a.m.    SpO2: 96 % O2 Flow Rate (L/min): 2 L/min FiO2 (%): 55 %   COVID-19 Labs  Recent Labs    03/29/19 0920  DDIMER 4.72*    Lab Results  Component Value Date   SARSCOV2NAA NEGATIVE 2019/04/13     CBG: Recent Labs  Lab 03/29/19 2021 03/30/19 0053 03/30/19 0429 03/30/19 0816 03/30/19 1145  GLUCAP 114* 116* 115* 106* 122*    CBC: Recent Labs  Lab 03/26/19 0446 03/27/19 0457 03/28/19 0500 03/29/19 0425 03/30/19 0446  WBC 7.0 7.9 8.7 9.6 9.4  HGB 11.1* 11.2* 12.0* 12.7* 12.6*  HCT 34.0* 33.8* 36.1* 38.4* 39.4  MCV 102.4* 99.1 98.1 99.0 102.9*  PLT PLATELET CLUMPS NOTED ON SMEAR, UNABLE  TO ESTIMATE 120* 142* 156 193    Basic Metabolic Panel: Recent Labs  Lab 03/25/19 0507 03/25/19 0507 03/25/19 1619 03/26/19 0446 03/26/19 0446 03/26/19 1706 03/27/19 0457 03/28/19 0500 03/29/19 0425 03/30/19 0446  NA 141   < >  --  138   < > 143 145 143 149* 154*  K 3.7   < >  --  4.6   < > 2.9* 2.9* 2.8* 2.9* 3.7  CL 114*   < >  --  112*   < > 107 106 109 115* 121*  CO2 21*   < >  --  19*   < > 25 27 22  21*  19*  GLUCOSE 190*   < >  --  138*   < > 141* 120* 147* 124* 120*  BUN 17   < >  --  18   < > 16 17 24* 25* 26*  CREATININE 1.32*   < >  --  0.91   < > 1.17 1.06 1.03 1.22 1.27*  CALCIUM 8.5*   < >  --  7.8*   < > 8.7* 8.6* 8.9 9.4 9.3  MG 1.9  --  1.8 1.9  --   --  1.9  --  2.1  --   PHOS 1.6*  --  2.1* 1.3*  --   --  1.6*  --  1.8*  --    < > = values in this interval not displayed.     Liver Function Tests: Recent Labs  Lab 03/24/19 0423 03/25/19 0507  ALBUMIN 2.5* 2.2*        DVT prophylaxis: Lovenox  Code Status: Full code  Family Communication: No family at bedside  Disposition Plan: likely home when medically ready for discharge         Scheduled medications:  . aspirin  81 mg Per Tube Daily  . atorvastatin  40 mg Per Tube q1800  . chlorhexidine  15 mL Mouth Rinse BID  . Chlorhexidine Gluconate Cloth  6 each Topical Daily  . donepezil  5 mg Per Tube QHS  . enoxaparin (LOVENOX) injection  40 mg Subcutaneous Q24H  . LORazepam  0.5 mg Oral Q4H   Or  . LORazepam  0.5 mg Intramuscular Q4H  . mouth rinse  15 mL Mouth Rinse q12n4p  . metoprolol tartrate  25 mg Per Tube BID  . QUEtiapine  50 mg Per Tube QHS  . sodium chloride flush  10-40 mL Intracatheter Q12H  . ticagrelor  90 mg Oral BID   Or  . ticagrelor  90 mg Per Tube BID     Antibiotics:   Anti-infectives (From admission, onward)   Start     Dose/Rate Route Frequency Ordered Stop   03/30/19 1000  ceFEPIme (MAXIPIME) 2 g in sodium chloride 0.9 % 100 mL IVPB     2 g 200 mL/hr over 30 Minutes Intravenous Every 12 hours 03/30/19 0806     03/29/19 1230  vancomycin (VANCOREADY) IVPB 1750 mg/350 mL     1,750 mg 175 mL/hr over 120 Minutes Intravenous Every 48 hours 03/29/19 1141     03/29/19 1200  ceFEPIme (MAXIPIME) 2 g in sodium chloride 0.9 % 100 mL IVPB  Status:  Discontinued     2 g 200 mL/hr over 30 Minutes Intravenous Every 24 hours 03/29/19 1129 03/30/19 0806   03/29/19 1130  vancomycin  (VANCOCIN) IVPB 1000 mg/200 mL premix  Status:  Discontinued     1,000 mg 200  mL/hr over 60 Minutes Intravenous  Once 03/29/19 1129 03/29/19 1140   03/24/19 0930  ampicillin-sulbactam (UNASYN) 1.5 g in sodium chloride 0.9 % 100 mL IVPB  Status:  Discontinued     1.5 g 200 mL/hr over 30 Minutes Intravenous Every 8 hours 03/24/19 0852 03/29/19 1129   2019-04-10 1453  ceFAZolin (ANCEF) 2-4 GM/100ML-% IVPB    Note to Pharmacy: Ferd Hibbs   : cabinet override      April 10, 2019 1453 2019-04-10 1833       Objective   Vitals:   03/30/19 0830 03/30/19 0831 03/30/19 1053 03/30/19 1311  BP: (!) 150/109  (!) 154/89 (!) 138/118  Pulse: 60  89 96  Resp: (!) 30  (!) 28 (!) 40  Temp:  100.3 F (37.9 C) 100.3 F (37.9 C) (!) 97.5 F (36.4 C)  TempSrc:  Oral Oral Oral  SpO2: 98%  98% 96%  Weight:      Height:        Intake/Output Summary (Last 24 hours) at 03/30/2019 1316 Last data filed at 03/30/2019 1059 Gross per 24 hour  Intake 1398.2 ml  Output 2600 ml  Net -1201.8 ml    02/13 1901 - 02/15 0700 In: 2258.2 [I.V.:1298.2] Out: 3600 [Urine:3600]  Filed Weights   2019-04-10 1200 03/28/19 0500 03/30/19 0500  Weight: 85.8 kg 86 kg 83.7 kg    Physical Examination:    General: Somnolent  Cardiovascular: S1-S2, regular, no murmur auscultated  Respiratory: Bilateral rhonchi auscultated  Abdomen: Abdomen is soft, nontender, no organomegaly  Extremities: No edema in the lower extremities  Neurologic: Somnolent, appears restless    Data Reviewed:   Recent Results (from the past 240 hour(s))  Respiratory Panel by RT PCR (Flu A&B, Covid) - Nasopharyngeal Swab     Status: None   Collection Time: 2019-04-10  1:43 PM   Specimen: Nasopharyngeal Swab  Result Value Ref Range Status   SARS Coronavirus 2 by RT PCR NEGATIVE NEGATIVE Final    Comment: (NOTE) SARS-CoV-2 target nucleic acids are NOT DETECTED. The SARS-CoV-2 RNA is generally detectable in upper respiratoy specimens during the  acute phase of infection. The lowest concentration of SARS-CoV-2 viral copies this assay can detect is 131 copies/mL. A negative result does not preclude SARS-Cov-2 infection and should not be used as the sole basis for treatment or other patient management decisions. A negative result may occur with  improper specimen collection/handling, submission of specimen other than nasopharyngeal swab, presence of viral mutation(s) within the areas targeted by this assay, and inadequate number of viral copies (<131 copies/mL). A negative result must be combined with clinical observations, patient history, and epidemiological information. The expected result is Negative. Fact Sheet for Patients:  https://www.moore.com/ Fact Sheet for Healthcare Providers:  https://www.young.biz/ This test is not yet ap proved or cleared by the [place] FDA and  has been authorized for detection and/or diagnosis of SARS-CoV-2 by FDA under an Emergency Use Authorization (EUA). This EUA will remain  in effect (meaning this test can be used) for the duration of the COVID-19 declaration under Section 564(b)(1) of the Act, 21 U.S.C. section 360bbb-3(b)(1), unless the authorization is terminated or revoked sooner.    Influenza A by PCR NEGATIVE NEGATIVE Final   Influenza B by PCR NEGATIVE NEGATIVE Final    Comment: (NOTE) The Xpert Xpress SARS-CoV-2/FLU/RSV assay is intended as an aid in  the diagnosis of influenza from Nasopharyngeal swab specimens and  should not be used as a sole basis for treatment.  Nasal washings and  aspirates are unacceptable for Xpert Xpress SARS-CoV-2/FLU/RSV  testing. Fact Sheet for Patients: PinkCheek.be Fact Sheet for Healthcare Providers: GravelBags.it This test is not yet approved or cleared by the [place] FDA and  has been authorized for detection and/or diagnosis of SARS-CoV-2  by  FDA under an Emergency Use Authorization (EUA). This EUA will remain  in effect (meaning this test can be used) for the duration of the  Covid-19 declaration under Section 564(b)(1) of the Act, 21  U.S.C. section 360bbb-3(b)(1), unless the authorization is  terminated or revoked. Performed at [facility], [place] [address] [place], [place] [address]   MRSA PCR Screening     Status: None   Collection Time: 03/23/19  5:46 AM   Specimen: Nasopharyngeal  Result Value Ref Range Status   MRSA by PCR NEGATIVE NEGATIVE Final    Comment:        The GeneXpert MRSA Assay (FDA approved for NASAL specimens only), is one component of a comprehensive MRSA colonization surveillance program. It is not intended to diagnose MRSA infection nor to guide or monitor treatment for MRSA infections. Performed at [facility], [place] [address], [place], [place] [address]   Culture, respiratory (non-expectorated)     Status: None   Collection Time: 03/23/19  4:34 PM   Specimen: Tracheal Aspirate; Respiratory  Result Value Ref Range Status   Specimen Description TRACHEAL ASPIRATE  Final   Special Requests NONE  Final   Gram Stain   Final    FEW WBC PRESENT, PREDOMINANTLY MONONUCLEAR ABUNDANT GRAM NEGATIVE RODS MODERATE GRAM POSITIVE COCCI FEW GRAM VARIABLE ROD FEW SQUAMOUS EPITHELIAL CELLS PRESENT    Culture   Final    FEW Consistent with normal respiratory flora. Performed at [facility], [place] [address], [place], [place] [address]    Report Status 03/26/2019 FINAL  Final  Culture, Urine     Status: None   Collection Time: 03/23/19  6:06 PM   Specimen: Urine, Random  Result Value Ref Range Status   Specimen Description URINE, RANDOM  Final   Special Requests NONE  Final   Culture   Final    NO GROWTH Performed at [facility], [place] [address], [place], [place] [address]    Report Status 03/25/2019 FINAL  Final  Culture, blood (x 2)     Status: None (Preliminary  result)   Collection Time: 03/29/19 11:49 AM   Specimen: BLOOD  Result Value Ref Range Status   Specimen Description BLOOD RIGHT ANTECUBITAL  Final   Special Requests   Final    BOTTLES DRAWN AEROBIC ONLY Blood Culture adequate volume   Culture   Final    NO GROWTH < 24 HOURS Performed at [facility], [place] [address] [place], [place] [address]    Report Status PENDING  Incomplete  Culture, blood (x 2)     Status: None (Preliminary result)   Collection Time: 03/29/19 11:55 AM   Specimen: BLOOD RIGHT FOREARM  Result Value Ref Range Status   Specimen Description BLOOD RIGHT FOREARM  Final   Special Requests   Final    BOTTLES DRAWN AEROBIC ONLY Blood Culture adequate volume   Culture   Final    NO GROWTH < 24 HOURS Performed at [facility], [place] [address] [place], [place] [address]    Report Status PENDING  Incomplete    No results for input(s): LIPASE, AMYLASE in the last  168 hours. No results for input(s): AMMONIA in the last 168 hours.  Cardiac Enzymes: No results for input(s): CKTOTAL, CKMB, CKMBINDEX, TROPONINI in the last 168 hours. BNP (last 3 results) No results for input(s): BNP in the last 8760 hours.  ProBNP (last 3 results) No results for input(s): PROBNP in the last 8760 hours.  Studies:  DG Chest Port 1 View  Result Date: 03/29/2019 CLINICAL DATA:  Acute respiratory distress EXAM: PORTABLE CHEST 1 VIEW COMPARISON:  03/26/2019 FINDINGS: Cardiac shadow remains enlarged. Endotracheal tube has been removed. Feeding catheter is noted extending into the stomach. The right-sided PICC line is been removed as well. Patchy opacities are noted within both lungs worse on the right than the left consistent with multifocal pneumonia. No sizable effusion is noted. IMPRESSION: Changes of multifocal infiltrate. Electronically Signed   By: Alcide Clever M.D.   On: 03/29/2019 09:44      Dulse Rutan S Kord Monette   [facility] If 7PM-7AM, please contact night-coverage  at [email or web address], Office  [phone]  password [email or web address]  03/30/2019, 1:16 PM  LOS: 8 days

## 2019-03-30 NOTE — Progress Notes (Signed)
PULMONARY / CRITICAL CARE MEDICINE   NAME:  Jonathan Berger, MRN:  [id number], DOB:  18-Jul-1931, LOS: 8 ADMISSION DATE:  2019-04-10, CONSULTATION DATE: 04/10/2019 REFERRING MD: Interventional radiology neurology, CHIEF COMPLAINT: Vent dependent respiratory failure secondary to procedure  BRIEF HISTORY:    86 year old presenting 2/7 with slurred speech and dysphagia found to have acute right middle cerebral artery stroke s/p tPA and EVR who returns to ICU on mechanical ventilation overnight.   SIGNIFICANT PAST MEDICAL HISTORY   Hypertension  SIGNIFICANT EVENTS:  April 10, 2019 right MCA stroke  STUDIES:   2/7 CTH >>  Hyperdense Right MCA suspicious for emergent large vessel occlusion.  No acute intracranial hemorrhage. 2/7 CTA head/ neck>> Positive for Right MCA Emergent Large Vessel Occlusion affecting the mid and distal M1 segment. Critical Stenosis cervical Right ICA beginning at the origin, throughout the proximal 3.5 cm of the vessel. There are tandem RADIOGRAPHIC STRING SIGN STENOSES. Additional severe atherosclerosis and multifocal high-grade arterial stenoses elsewhere: - Left ICA distal bulb RADIOGRAPHIC STRING SIGN STENOSIS. - Severe bilateral Vertebral Artery origin stenoses. - proximal Left Subclavian Artery moderate stenosis due to plaque and tortuosity. 2/8 TTE >>  Left ventricular ejection fraction, by estimation, is 45%. The left ventricle has mildly decreased function. The left ventrical demonstrates regional wall motion abnormalities (see scoring diagram/findings for description). Akinesis of the mid to apical anteroseptal and inferoseptal walls. Left ventricular diastolic parameters are consistent with Grade I diastolic dysfunction (impaired relaxation). 2. Right ventricular systolic function is mildly reduced. 2/8 MRI Brain >> 3.1 cm acute cortical/subcortical infarct within the anterolateral right temporal lobe. Numerous additional small scattered acute cortical and subcortical infarct  within the right cerebral hemisphere MCA vascular territory.  03/30/2019: CT head pending 03/30/2019: EEG pending  CULTURES:  2/7 SARS2/ Flu A/B >> neg 2/8 urine > negative 2/8 resp > normal flora  ANTIBIOTICS:  2/7 cefazolin pre op 2/9 unasyn >>  LINES/TUBES:  04/10/2019 endotracheal tube >> 2019/04/10 left radial A-line >>2/9 10-Apr-2019 right femoral sheath >>2/9 03/30/2019 endotracheal tube placement  CONSULTANTS:  04-10-19 pulmonary critical care  03/30/2019: Pulmonary critical care consultation  SUBJECTIVE/INTERVAL:  Decompensated on the floor.  Increasing O2 requirements more somnolent.  Patient was transferred to the intensive care unit.  Decision was made for urgent endotracheal intubation.  Discussed with patient's wife at bedside.  She is agreeable to proceed with short-term intubation.  Would not want prolonged intubation as this is a second time on mechanical support.     Upon intubation there was a significant amount of dark bloody secretions coming from the patient's posterior pharynx and the location of the esophagus just posterior to the video laryngoscope blade.                   CONSTITUTIONAL: BP 110/70   Pulse 60   Temp 99.9 F (37.7 C) (Oral)   Resp (!) 24   Ht 5\' 11"  (1.803 m)   Wt 83.7 kg   SpO2 95%   BMI 25.74 kg/m   PHYSICAL EXAM:  General: Elderly chronically debilitated male, moans HEENT: NCAT, sclera clear not tracking, NG tube in place Neuro: Moans and withdraws to pain, left-sided facial droop CV: Tachycardic, S1-S2 PULM: Bilateral ventilated breath sounds GI: Soft, nontender nondistended Extremities: Muscle wasting no significant edema  ASSESSMENT AND PLAN    Acute hypoxemic respiratory failure requiring intubation mechanical ventilation likely secondary to worsening altered mental status in the setting of recent stroke. Unclear of this recent change in mental status  possible conversion of stroke versus seizure however no significant seizure-like activity noted by nursing staff. -Decision made for endotracheal intubation -Please see separate procedure note -Remains on antimicrobials at this time can continue -Stat head CT pending, EEG pending -Chest x-ray: Right lower lobe middle lobe collapse, likely mucous plugging or aspiration event. -Chest PT saline lavage  Possible GI bleeding -PPI started IV twice daily -Check H&H  R MCA infarct s/p tPA and IR with L ICA stent placement. In and out of AF vs ST with PAC.  -Per neurology -Remains on ticagrelor  Chronic HFrEF SR with PACs -Beta-blocker when able  Generalized deconditioning. -Rehab at some point  Dysphagia. -Continue core track placement with tube feeding once able -Holding n.p.o. at this time OG tube dropped in place to suction   This patient is critically ill with multiple organ system failure; which, requires frequent high complexity decision making, assessment, support, evaluation, and titration of therapies. This was completed through the application of advanced monitoring technologies and extensive interpretation of multiple databases. During this encounter critical care time was devoted to patient care services described in this note for 32 minutes.  Josephine Igo, DO [facility] 03/30/2019 6:27 PM

## 2019-03-30 NOTE — Progress Notes (Signed)
Inpatient Rehabilitation-Admissions Coordinator   Pt's insurance authorization process was postponed due to decline in medical condition. Will continue to follow up and submit for insurance approval once appropriate.   Cheri Rous, OTR/L  Rehab Admissions Coordinator  [phone] 03/30/2019 2:57 PM

## 2019-03-30 NOTE — Progress Notes (Signed)
Notified MD of MEWS score and respirations

## 2019-03-30 NOTE — Progress Notes (Signed)
PT Cancellation Note  Patient Details Name: LYNETTE NOAH MRN: [id number] DOB: 10-02-1931   Cancelled Treatment:    Reason Eval/Treat Not Completed: Patient not medically ready. RN reports patient in 4 point restraints. Agitated, not appropriate for PT today. Will continue to follow and see as appropriate.    Hailynn Slovacek 03/30/2019, 11:42 AM

## 2019-03-30 NOTE — Progress Notes (Signed)
Pt emergently transferred to 4 North room 20 for intubation. He was agitated, with unintelligable speech, and moving all extremities.

## 2019-03-30 NOTE — Progress Notes (Signed)
Patient transported to CT scan and back to 4N20 without incidence.  

## 2019-03-30 NOTE — Progress Notes (Signed)
Oral care performed with suction

## 2019-03-30 NOTE — Progress Notes (Signed)
STROKE TEAM PROGRESS NOTE   INTERVAL HISTORY Pt lying in bed, gurgling sound in mouth with tachypnea and agitation. Tried to suction but pt not cooperative. Suction with blood tinted sputum. His ativan Q4h schedule dose has been discontinued. CXR showed multifocal infiltrate, concerning for aspiration. Dr. [name] discussed with pt wife and remains full code. CCM will be consulted and likely need intubation.   Vitals:   03/30/19 0830 03/30/19 0831 03/30/19 1053 03/30/19 1311  BP: (!) 150/109  (!) 154/89 (!) 138/118  Pulse: 60  89 96  Resp: (!) 30  (!) 28 (!) 40  Temp:  100.3 F (37.9 C) 100.3 F (37.9 C) (!) 97.5 F (36.4 C)  TempSrc:  Oral Oral Oral  SpO2: 98%  98% 96%  Weight:      Height:       CBC:  Recent Labs  Lab 03/29/19 0425 03/30/19 0446  WBC 9.6 9.4  HGB 12.7* 12.6*  HCT 38.4* 39.4  MCV 99.0 102.9*  PLT 156 263   Basic Metabolic Panel:  Recent Labs  Lab 03/27/19 0457 03/28/19 0500 03/29/19 0425 03/30/19 0446  NA 145   < > 149* 154*  K 2.9*   < > 2.9* 3.7  CL 106   < > 115* 121*  CO2 27   < > 21* 19*  GLUCOSE 120*   < > 124* 120*  BUN 17   < > 25* 26*  CREATININE 1.06   < > 1.22 1.27*  CALCIUM 8.6*   < > 9.4 9.3  MG 1.9  --  2.1  --   PHOS 1.6*  --  1.8*  --    < > = values in this interval not displayed.   Lipid Panel:     Component Value Date/Time   CHOL 154 03/23/2019 0303   TRIG 109 03/24/2019 2100   HDL 36 (L) 03/23/2019 0303   CHOLHDL 4.3 03/23/2019 0303   VLDL 38 03/23/2019 0303   LDLCALC 80 03/23/2019 0303   HgbA1c:  Lab Results  Component Value Date   HGBA1C 5.9 (H) 03/23/2019   IMAGING past 48 hours DG Chest Port 1 View  Result Date: 03/29/2019 CLINICAL DATA:  Acute respiratory distress EXAM: PORTABLE CHEST 1 VIEW COMPARISON:  03/26/2019 FINDINGS: Cardiac shadow remains enlarged. Endotracheal tube has been removed. Feeding catheter is noted extending into the stomach. The right-sided PICC line is been removed as well. Patchy  opacities are noted within both lungs worse on the right than the left consistent with multifocal pneumonia. No sizable effusion is noted. IMPRESSION: Changes of multifocal infiltrate. Electronically Signed   By: Inez Catalina M.D.   On: 03/29/2019 09:44   Cerebral Angiogram 04/08/2019 S/P bilateral common carotid arteriograms followed by complete revascularization of RT MCA pre occlusive thrombus following balloon angioplasty with stent placement at prox RT ICA for severe pre occlusive stenosis ,and balloon angioplasty of heavily calcified plaque in proc RT ICAA with a patency  Of approx 80 % .  With TICI 3 revascularization   PHYSICAL EXAM   Temp:  [97.5 F (36.4 C)-100.6 F (38.1 C)] 97.5 F (36.4 C) (02/15 1311) Pulse Rate:  [60-109] 96 (02/15 1311) Resp:  [20-40] 40 (02/15 1311) BP: (128-176)/(82-130) 138/118 (02/15 1311) SpO2:  [94 %-99 %] 96 % (02/15 1311) Weight:  [83.7 kg] 83.7 kg (02/15 0500)  General - Well nourished, well developed, agitated in respiratory distress.  Ophthalmologic - fundi not visualized due to noncooperation.  Cardiovascular - irregular heart beats  with tachycardia.  Respiratory - tachypnea, respiratory distress, copious secretion deep in throat  Neuro - lethargic but eyes open, agitated and restles. Severe dysarthria and not intelligible words, not able to say in sentences. Not following commands, but tracking in both sides. Eyes in mid position, PERRL, blinking to visual threat bilaterally. Facial symmetric. BUE and BLE4/5 symmetric movement. Sensation metrical, bilateral finger-to-nose intact and gait not tested.   ASSESSMENT/PLAN [name] is a 86 y.o. male with history of HTN presenting with slurred speech and L facial droop. Received tPA 2019-04-21 at 1324. Taken for IR for distal R M1 thrombus.   Respiratory failure RLL aspiration pneumonia  intubate for IR -> Extubated 2/11   Copious secretion and SOB post extubation but stable O2  sat  CXR RLL infiltrates -> Worsening opacification   On unasyn 2/9>>2/14  Leukocytosis WBC 11.4->9.1->7.0->7.9->9.6->9.4   Temp  101.1->99.8->afebrile->100.3->afebrile->100.0->100.6   Sputum Cx nml flora  Developed Tachypnea / Tachycardia / Low O2 sats / Fever 2/13  CXR 2/14 Changes of multifocal infiltrate  Cefepime 2/14>>  Vanc 2/14>>  Supplemental O2   Lactic acid normal  BCx no growth < 24h  TRH onboard -> CCM consult -> likely needs intubation  Stroke:   R MCA scattered infarcts s/p tPA and IR w/ R ICA stent placement, infarct secondary to large vessel disease source  CT head hyperdense R MCA.     CTA head & neck R MCA LVO mid and distal segment. Cervical R ICA critical stenosis w/ random string sign stenoses. L ICA distal bulb string sign, severe B VA origin stenoses   Cerebral angio TICI 3 revascularization R MCA preocclusive thrombus w/ angioplasty and stent at proximal R ICA and angioplasty of 80% plaque in L ICA.   MRI  R temporal lobe cortical/ subcortical infarct w/ numerous small scattered R brain infarcts. Proximal R M2 MCA w/ loss of signal likely residual thrombus.    2D Echo EF 45%, no SOE seen  Carotid doppler -  left ICA 60-79%  stenosis.  LDL 80  HgbA1c 5.9  P2Y12 = 8  lovenox 40 for VTE prophylaxis  No antithrombotic prior to admission, now on Brilinta (ticagrelor) 90 mg bid and ASA 81mg  daily.  Continue on discharge  Therapy recommendations:  CIR  Disposition:  pending   New HFrEF, ? D/t critical illness SR w/ PACs  2D Echo EF 45%, no SOE seen  Treated w/ lasix  On metoprolol 25mg  bid  Repeat 2D as OP  On ASA and brilinta  Troponins - 27-30-33   Cardiology reconsulted  PAF  ECG - Afib with RVR rate 157; 2nd EKG sinus arrhythmia w/ tachycardia. 3rd EKG NSR w/ PVCs  EKGs reveiwed by cardiology - multifocal atrial tachycardia with intermittent Afib  On metoprolol 50 bid  D dimer - 4.72 (H)      Cardiology recommend  plavix and eliquis once he is medically stable and able to take po  Carotid stenosis  CTA neck - bilateral ICA bulb string sign  S/p right ICA stenting  S/p left ICA angioplasty  CUS - left ICA  60-79%  stenosis.  Consider further left ICA intervention in the near future - Dr. aware  Hx of Hypertension Hypotension d/t sedation, resolved  Home meds:  Valsartan-HCTZ 160-12.5, amlodipine 5  Off levophed gtt  BP stable . BP goal 110-180  . Long-term BP goal normotensive  Hyperlipidemia  Home meds:  No statin  LDL 80, goal < 70  On lipitor 40   Continue statin at discharge  Delirium / Agitation Dementia, baseline  On namenda and aricept PTA  On Seroquel 50 at bedtime  Treated w/ IV atival 0.5 q 4h since 2/13. Stopped 2/15 -> likely a contributing factor for aspiration pneumonia  In 4-point restraints per PT   Dysphagia . Secondary to stroke . NPO . TF via Cortrak . TF changed to bolus feeds.   Marland Kitchen Speech on board  Other Stroke Risk Factors  Advanced age  Former Cigarette smoker, quit 45 yrs ago  ETOH use, will advise to drink no more than 2 drink(s) a day   Other Active Problems  Urinary retention, foley placed  Thrombocytopenia - platelet 159->130->clump->120->142->156->193   Hypokalemia 4.6-2.9-2.9 - supplement - 2.8 ->supplement ->2.9->supplement ->3.7   Hypophosphatemia 2.1-1.3-1.6 - supplement->1.8   Anemia - Hb - 11.2->12.0->12.7  CRE 1.03->1.22->1.27    Hospital day # 8  I spent  35 minutes in total face-to-face time with the patient, more than 50% of which was spent in counseling and coordination of care, reviewing test results, images and medication, and discussing the diagnosis of aspiration pneumonia, respiratory distress, respiratory failure, treatment plan and potential prognosis. This patient's care requiresreview of multiple databases, neurological assessment, discussion with family, other specialists and medical decision  making of high complexity. I have discussed with Dr. Sharl Ma.   Marvel Plan, MD PhD Stroke Neurology 03/30/2019 6:03 PM       To contact Stroke Continuity provider, please refer to [email or web address]. After hours, contact General Neurology

## 2019-03-30 NOTE — Progress Notes (Signed)
Progress Note  Patient Name: Jonathan Berger Date of Encounter: 03/30/2019  Primary Cardiologist: Little Ishikawa, MD   Subjective   Unable to answer questions. Hand mitten on. Appears to be agitated.   Inpatient Medications    Scheduled Meds: . aspirin  81 mg Per Tube Daily  . atorvastatin  40 mg Per Tube q1800  . chlorhexidine  15 mL Mouth Rinse BID  . Chlorhexidine Gluconate Cloth  6 each Topical Daily  . donepezil  5 mg Per Tube QHS  . enoxaparin (LOVENOX) injection  40 mg Subcutaneous Q24H  . feeding supplement (OSMOLITE 1.5 CAL)  237 mL Per Tube 5 X Daily  . feeding supplement (PRO-STAT SUGAR FREE 64)  30 mL Oral TID  . mouth rinse  15 mL Mouth Rinse q12n4p  . metoprolol tartrate  25 mg Per Tube BID  . QUEtiapine  50 mg Per Tube QHS  . sodium chloride flush  10-40 mL Intracatheter Q12H  . ticagrelor  90 mg Oral BID   Or  . ticagrelor  90 mg Per Tube BID   Continuous Infusions: . sodium chloride    . ceFEPime (MAXIPIME) IV 2 g (03/30/19 1008)  . dextrose    . vancomycin 1,750 mg (03/29/19 1321)   PRN Meds: acetaminophen **OR** acetaminophen (TYLENOL) oral liquid 160 mg/5 mL **OR** acetaminophen, metoprolol tartrate, senna-docusate, sodium chloride flush   Vital Signs    Vitals:   03/30/19 0830 03/30/19 0831 03/30/19 1053 03/30/19 1311  BP: (!) 150/109  (!) 154/89 (!) 138/118  Pulse: 60  89 96  Resp: (!) 30  (!) 28 (!) 40  Temp:  100.3 F (37.9 C) 100.3 F (37.9 C) (!) 97.5 F (36.4 C)  TempSrc:  Oral Oral Oral  SpO2: 98%  98% 96%  Weight:      Height:        Intake/Output Summary (Last 24 hours) at 03/30/2019 1419 Last data filed at 03/30/2019 1059 Gross per 24 hour  Intake 1398.2 ml  Output 2600 ml  Net -1201.8 ml   Last 3 Weights 03/30/2019 03/28/2019 03/25/2019  Weight (lbs) 184 lb 8.4 oz 189 lb 9.5 oz 189 lb 2.5 oz  Weight (kg) 83.7 kg 86 kg 85.8 kg      Telemetry    Multifocal tachycardia with occasional PAF - Personally  Reviewed  ECG    Atrial fibrillation with aberrancy - Personally Reviewed  Physical Exam   GEN: delirious, unable to answer any questions.   Neck: No JVD Cardiac: tachycardic. No significant heart murmur Respiratory: bilateral rhonchi. Nasal pharyngeal tube in place GI: Soft, non-distended  MS: No edema; No deformity. Neuro:  unable to follow command Psych: unable to assess  Labs    High Sensitivity Troponin:   Recent Labs  Lab 03/24/19 0950 03/29/19 0920 03/29/19 1149  TROPONINIHS 27* 30* 33*      Chemistry Recent Labs  Lab 03/24/19 0423 03/24/19 0423 03/25/19 0507 03/26/19 0446 03/28/19 0500 03/29/19 0425 03/30/19 0446  NA 140   < > 141   < > 143 149* 154*  K 3.9   < > 3.7   < > 2.8* 2.9* 3.7  CL 111   < > 114*   < > 109 115* 121*  CO2 21*   < > 21*   < > 22 21* 19*  GLUCOSE 145*   < > 190*   < > 147* 124* 120*  BUN 13   < > 17   < >  24* 25* 26*  CREATININE 1.19   < > 1.32*   < > 1.03 1.22 1.27*  CALCIUM 8.4*   < > 8.5*   < > 8.9 9.4 9.3  ALBUMIN 2.5*  --  2.2*  --   --   --   --   GFRNONAA 55*   < > 49*   < > >60 53* 51*  GFRAA >60   < > 56*   < > >60 >60 59*  ANIONGAP 8   < > 6   < > 12 13 14    < > = values in this interval not displayed.     Hematology Recent Labs  Lab 03/28/19 0500 03/29/19 0425 03/30/19 0446  WBC 8.7 9.6 9.4  RBC 3.68* 3.88* 3.83*  HGB 12.0* 12.7* 12.6*  HCT 36.1* 38.4* 39.4  MCV 98.1 99.0 102.9*  MCH 32.6 32.7 32.9  MCHC 33.2 33.1 32.0  RDW 13.5 14.1 14.5  PLT 142* 156 193    BNPNo results for input(s): BNP, PROBNP in the last 168 hours.   DDimer  Recent Labs  Lab 03/29/19 0920  DDIMER 4.72*     Radiology    DG Chest Port 1 View  Result Date: 03/29/2019 CLINICAL DATA:  Acute respiratory distress EXAM: PORTABLE CHEST 1 VIEW COMPARISON:  03/26/2019 FINDINGS: Cardiac shadow remains enlarged. Endotracheal tube has been removed. Feeding catheter is noted extending into the stomach. The right-sided PICC line is been  removed as well. Patchy opacities are noted within both lungs worse on the right than the left consistent with multifocal pneumonia. No sizable effusion is noted. IMPRESSION: Changes of multifocal infiltrate. Electronically Signed   By: Inez Catalina M.D.   On: 03/29/2019 09:44    Cardiac Studies   Echo 03/23/2019 1. Left ventricular ejection fraction, by estimation, is 45%. The left  ventricle has mildly decreased function. The left ventrical demonstrates  regional wall motion abnormalities (see scoring diagram/findings for  description). Akinesis of the mid to  apical anteroseptal and inferoseptal walls. Left ventricular diastolic  parameters are consistent with Grade I diastolic dysfunction (impaired  relaxation).  2. Right ventricular systolic function is mildly reduced. The right  ventricular size is mildly enlarged. There is mildly elevated pulmonary  artery systolic pressure. The estimated right ventricular systolic  pressure is 53.2 mmHg.  3. The aortic valve is tricuspid. Trivial aortic insufficiency, no aortic  stenosis.  4. No significant mitral regurgitation.  5. Dilated IVC with estimated RA pressure 15 mmHg.   Patient Profile     86 y.o. male with PMH of anemia, chronic back pain, remote tobacco use, and HTN presented on 2/7 with R MCA stroke, noted to have significant R ICA stenosis and MCA stenosis. Patient underwent TPA and R ICA stent placement. Cardiology initially consulted for abnormal EF 45%. Initially felt to have sinus rhythm with PACs, now has more persistent tachycardia concerning for PAF  Assessment & Plan    1. Chronic systolic heart failure  - Echo showed EF 45%, G1DD  - Dr. Su Grand recommended repeat echo once he recovers from acute illness, if EF still low, then consider outpatient ischemic workup. Need to make sure his mental status improve first before consider further cardiac workup  - no sign of volume overload.   2. PAF: previously felt to be  sinus rhythm with PACs, now appears to have more afib and multifocal tachycardia.   - not a candidate for oral anticoagulation therapy given AMS and aspiration risk. On  lovenox. May consider oral anticoagulation therapy if mental status able to recover.   - will focus on rate control strategy for now, consider increase tube feeding metoprolol to 25mg  TID dosing.   3. R MCA CVA s/p tPA: followed by neurology  4. Carotid artery disease s/p R ICA stent and angioplasty. Continue aspirin and Brilinta.   5. Respiratory failure secondary to aspiration pneumonitis  - intubated from 2/7 - 2/11  - current extubated, however significant rhonchi suspect ongoing aspiration. Treating with IV Cefepime and Vanc  Hypotension: improved. BP mostly in the 110-130s range. Off of pressor.   6. HLD: on lipitor.  7. NSVT: intermittent episodes  8. AMS and delirium      For questions or updates, please contact [facility] Please consult [email or web address] for contact info under        Signed, 05-31-1987, PA  03/30/2019, 2:19 PM

## 2019-03-30 NOTE — Procedures (Signed)
Intubation Procedure Note Jonathan Berger [id number] [birth year]  Procedure: Intubation Indications: Airway protection and maintenance  Procedure Details Consent: Unable to obtain consent because of altered level of consciousness. Time Out: Verified patient identification, verified procedure, site/side was marked, verified correct patient position, special equipment/implants available, medications/allergies/relevent history reviewed, required imaging and test results available.  Performed  Maximum sterile technique was used including cap, gloves, gown, hand hygiene and sheet.  MAC and 4    Evaluation Hemodynamic Status: BP stable throughout; O2 sats: stable throughout Patient's Current Condition: stable Complications: No apparent complications Patient did tolerate procedure well. Chest X-ray ordered to verify placement.  CXR: pending.   Jonathan Berger, Jonathan Berger 03/30/2019

## 2019-03-30 NOTE — Progress Notes (Signed)
Nutrition Follow-up  DOCUMENTATION CODES:   Not applicable  INTERVENTION:  Recommend: - Osmolite 1.5 5x daily via tube - Pro-stat TID -Flush with free water before and after each bolus (or per MD)   Recommended tube feeding regimen will provide 2075 kcals, 119grams of protein, free water    NUTRITION DIAGNOSIS:   Inadequate oral intake related to inability to eat as evidenced by NPO status.  Ongoing.  GOAL:   Patient will meet greater than or equal to 90% of their needs  Progressing.  MONITOR:   Labs, Weight trends, I & O's, Vent status, TF tolerance, Skin, Diet advancement  REASON FOR ASSESSMENT:   Consult Enteral/tube feeding initiation and management  ASSESSMENT:   86 year old male with past medical history of HTN , anemia, and chronic back pain presented to ED with slurred speech and left facial droop. CTA showed right ICA near occulusion with distal M1 thrombus extending into M2 branch.  Pt with new onset HF likely related to critical illness.  2/7 IR - R MCA embolization and revascularization 2/8 TEE - Left EF ~45% 2/9 CXR - new RLL infiltrate vs atelectasis 2/11 pt extubated 2/14 RRT called d/t increased respirations, decreased mental status  RD consulted to change pt's tube feeding to bolus.   Discussed pt with RN. Pt not currently receiving tube feeding.   Medications reviewed. Labs reviewed: Na 154 (H) CBGs 106-116  UOP: x24 hours I/O: -4,179.16ml since admit   Diet Order:   Diet Order            Diet NPO time specified  Diet effective now              EDUCATION NEEDS:   No education needs have been identified at this time  Skin:  Skin Assessment: Reviewed RN Assessment  Last BM:  2/13  Height:   Ht Readings from Last 1 Encounters:  03-25-19 5\' 11"  (1.803 m)    Weight:   Wt Readings from Last 1 Encounters:  03/30/19 83.7 kg    Ideal Body Weight:  78.2 kg  BMI:  Body mass index is 25.74  kg/m.  Estimated Nutritional Needs:   Kcal:  1900-2100  Protein:  110-125 grams  Fluid:  2L/day   04/01/19, MS, RD, LDN RD pager number and weekend/on-call pager number located in Amion.

## 2019-03-30 NOTE — Significant Event (Signed)
Rapid Response Event Note  Overview: Respiratory - Aspiration  Initial Focused Assessment: Called by PCCM PA - requesting urgent transfer to ICU for intubation. Upon arrival, patient was gurgling, not protecting his airway, and agitated. Patient was immediately taken to 4N20 and underwent emergent RSI and successfully.   Interventions: - Transfer to ICU  Plan of Care: - Transfer to ICU  Event Summary:  Start Time 1655  End Time 1725  Jonathan Berger R

## 2019-03-30 NOTE — Progress Notes (Signed)
BPs low, provider notified. Hold off on CT head till hemodynamically stable.

## 2019-03-30 NOTE — Progress Notes (Signed)
Patient transferred to 4N-20

## 2019-03-30 NOTE — Progress Notes (Signed)
RT NOTE: Patient unable to preform flutter this morning for RT. RT will continue to monitor as needed.

## 2019-03-30 NOTE — Procedures (Signed)
Intubation Procedure Note NAREK KNISS [id number] 04/27/1931  Procedure: Intubation Indications: Respiratory insufficiency  Procedure Details Consent: Risks of procedure as well as the alternatives and risks of each were explained to the (patient/caregiver).  Consent for procedure obtained. Time Out: Verified patient identification, verified procedure, site/side was marked, verified correct patient position, special equipment/implants available, medications/allergies/relevent history reviewed, required imaging and test results available.  Performed  Maximum sterile technique was used including gloves, hand hygiene and mask.  glidescope with direct visualization of vocal cords   Evaluation Hemodynamic Status: BP stable throughout; O2 sats: transiently fell during during procedure Patient's Current Condition: stable Complications: No apparent complications Patient did tolerate procedure well. Chest X-ray ordered to verify placement.  CXR: pending.   Delfin Gant 03/30/2019

## 2019-03-30 NOTE — Progress Notes (Signed)
Oral care performed.

## 2019-03-30 NOTE — Progress Notes (Signed)
PCCM Interval Progress Notes  Called by RN for hypotension with SBP 60's to 70's as well as intermittent agitation.  D/c propofol and scheduled metoprolol. Add phenylephrine infusion low dose via PIV. Change sedation to fentanyl gtt with PRN midazolam.   Rutherford Guys, PA [facility] 03/30/2019, 6:54 PM

## 2019-03-30 NOTE — Progress Notes (Signed)
EEG complete - results pending 

## 2019-03-31 ENCOUNTER — Inpatient Hospital Stay (HOSPITAL_COMMUNITY): Payer: Medicare Other

## 2019-03-31 DIAGNOSIS — R4182 Altered mental status, unspecified: Secondary | ICD-10-CM

## 2019-03-31 LAB — CBC
HCT: 40.5 % (ref 39.0–52.0)
Hemoglobin: 12.7 g/dL — ABNORMAL LOW (ref 13.0–17.0)
MCH: 32.3 pg (ref 26.0–34.0)
MCHC: 31.4 g/dL (ref 30.0–36.0)
MCV: 103.1 fL — ABNORMAL HIGH (ref 80.0–100.0)
Platelets: 223 10*3/uL (ref 150–400)
RBC: 3.93 MIL/uL — ABNORMAL LOW (ref 4.22–5.81)
RDW: 14.5 % (ref 11.5–15.5)
WBC: 9.8 10*3/uL (ref 4.0–10.5)
nRBC: 0 % (ref 0.0–0.2)

## 2019-03-31 LAB — BASIC METABOLIC PANEL
Anion gap: 12 (ref 5–15)
BUN: 28 mg/dL — ABNORMAL HIGH (ref 8–23)
CO2: 19 mmol/L — ABNORMAL LOW (ref 22–32)
Calcium: 8.9 mg/dL (ref 8.9–10.3)
Chloride: 122 mmol/L — ABNORMAL HIGH (ref 98–111)
Creatinine, Ser: 1.31 mg/dL — ABNORMAL HIGH (ref 0.61–1.24)
GFR calc Af Amer: 57 mL/min — ABNORMAL LOW (ref >60)
GFR calc non Af Amer: 49 mL/min — ABNORMAL LOW (ref >60)
Glucose, Bld: 133 mg/dL — ABNORMAL HIGH (ref 70–99)
Potassium: 3.6 mmol/L (ref 3.5–5.1)
Sodium: 153 mmol/L — ABNORMAL HIGH (ref 135–145)

## 2019-03-31 LAB — TRIGLYCERIDES: Triglycerides: 154 mg/dL — ABNORMAL HIGH (ref <150)

## 2019-03-31 MED ORDER — ORAL CARE MOUTH RINSE
15.0000 mL | OROMUCOSAL | Status: DC
  Administered 2019-03-31 – 2019-04-17 (×138): 15 mL via OROMUCOSAL

## 2019-03-31 MED ORDER — DOXAZOSIN MESYLATE 1 MG PO TABS
1.0000 mg | ORAL_TABLET | Freq: Every day | ORAL | Status: DC
  Administered 2019-03-31 – 2019-04-14 (×15): 1 mg
  Filled 2019-03-31 (×15): qty 1

## 2019-03-31 MED ORDER — NITROGLYCERIN 2 % TD OINT
1.0000 [in_us] | TOPICAL_OINTMENT | Freq: Once | TRANSDERMAL | Status: DC | PRN
  Filled 2019-03-31: qty 1

## 2019-03-31 MED ORDER — OSMOLITE 1.5 CAL PO LIQD
1000.0000 mL | ORAL | Status: DC
  Administered 2019-03-31 – 2019-04-06 (×5): 1000 mL
  Filled 2019-03-31 (×8): qty 1000

## 2019-03-31 MED ORDER — CHLORHEXIDINE GLUCONATE 0.12% ORAL RINSE (MEDLINE KIT)
15.0000 mL | Freq: Two times a day (BID) | OROMUCOSAL | Status: DC
  Administered 2019-03-31 – 2019-04-17 (×32): 15 mL via OROMUCOSAL

## 2019-03-31 MED ORDER — FREE WATER
300.0000 mL | Status: DC
  Administered 2019-03-31 – 2019-04-01 (×5): 300 mL

## 2019-03-31 MED ORDER — VANCOMYCIN HCL IN DEXTROSE 1-5 GM/200ML-% IV SOLN
1000.0000 mg | INTRAVENOUS | Status: AC
  Administered 2019-03-31 – 2019-04-04 (×5): 1000 mg via INTRAVENOUS
  Filled 2019-03-31 (×7): qty 200

## 2019-03-31 MED ORDER — PRO-STAT SUGAR FREE PO LIQD
30.0000 mL | Freq: Three times a day (TID) | ORAL | Status: DC
  Administered 2019-03-31 – 2019-04-17 (×53): 30 mL
  Filled 2019-03-31 (×52): qty 30

## 2019-03-31 MED ORDER — DEXMEDETOMIDINE HCL IN NACL 400 MCG/100ML IV SOLN
0.4000 ug/kg/h | INTRAVENOUS | Status: DC
  Administered 2019-03-31: 17:00:00 0.3 ug/kg/h via INTRAVENOUS
  Administered 2019-03-31: 0.4 ug/kg/h via INTRAVENOUS
  Administered 2019-04-01: 0.5 ug/kg/h via INTRAVENOUS
  Filled 2019-03-31: qty 100
  Filled 2019-03-31: qty 200

## 2019-03-31 MED ORDER — BISACODYL 10 MG RE SUPP
10.0000 mg | Freq: Once | RECTAL | Status: AC
  Administered 2019-03-31: 11:00:00 10 mg via RECTAL
  Filled 2019-03-31: qty 1

## 2019-03-31 NOTE — Procedures (Signed)
Patient Name: Jonathan Berger  MRN: [id number]  Epilepsy Attending: Charlsie Quest  Referring Physician/Provider: Raymon Mutton, NP Date: 03/30/2019 Duration: 22.38 mins  Patient history: 86 y.o. male with history of HTN presenting with slurred speech and L facial droop, diagnosed with right temporal lobe infarct. EEG to evaluate for seizure.  Level of alertness: lethargic  AEDs during EEG study: Versed  Technical aspects: This EEG study was done with scalp electrodes positioned according to the 10-20 International system of electrode placement. Electrical activity was acquired at a sampling rate of 500Hz  and reviewed with a high frequency filter of 70Hz  and a low frequency filter of 1Hz . EEG data were recorded continuously and digitally stored.   DESCRIPTION: EEG showed continuous generalized 3-6Hz  theta-delta slowing. EEG was reactive to tactile stimuli. Hyperventilation and photic stimulation were not performed.  ABNORMALITY - Continuous slow, generalized  IMPRESSION: This study is suggestive of moderate to severe diffuse encephalopathy, non specific to etiology. No seizures or epileptiform discharges were seen throughout the recording.  Jonathan Berger 

## 2019-03-31 NOTE — Progress Notes (Signed)
Progress Note  Patient Name: Jonathan Berger Date of Encounter: 03/31/2019  Primary Cardiologist: Donato Heinz, MD   Subjective   Continues to be intubated.  He responds to voice and touch.  Not communicating.  His wife is at bedside.  No longer on pressors.  No active bleeding noted.  Inpatient Medications    Scheduled Meds: . aspirin  81 mg Per Tube Daily  . atorvastatin  40 mg Per Tube q1800  . bisacodyl  10 mg Rectal Once  . chlorhexidine gluconate (MEDLINE KIT)  15 mL Mouth Rinse BID  . Chlorhexidine Gluconate Cloth  6 each Topical Daily  . donepezil  5 mg Per Tube QHS  . doxazosin  1 mg Per Tube Daily  . enoxaparin (LOVENOX) injection  40 mg Subcutaneous Q24H  . feeding supplement (OSMOLITE 1.5 CAL)  237 mL Per Tube 5 X Daily  . feeding supplement (PRO-STAT SUGAR FREE 64)  30 mL Per Tube TID  . fentaNYL (SUBLIMAZE) injection  100 mcg Intravenous Once  . free water  300 mL Per Tube Q4H  . mouth rinse  15 mL Mouth Rinse 10 times per day  . [START ON 04/03/2019] pantoprazole  40 mg Intravenous Q12H  . sodium chloride flush  10-40 mL Intracatheter Q12H  . ticagrelor  90 mg Oral BID   Or  . ticagrelor  90 mg Per Tube BID   Continuous Infusions: . sodium chloride    . sodium chloride    . ceFEPime (MAXIPIME) IV 2 g (03/31/19 1025)  . dexmedetomidine (PRECEDEX) IV infusion    . fentaNYL infusion INTRAVENOUS 150 mcg/hr (03/31/19 1000)  . phenylephrine (NEO-SYNEPHRINE) Adult infusion Stopped (03/31/19 0701)  . vancomycin Stopped (03/29/19 1657)   PRN Meds: acetaminophen **OR** acetaminophen (TYLENOL) oral liquid 160 mg/5 mL **OR** acetaminophen, fentaNYL (SUBLIMAZE) injection, metoprolol tartrate, midazolam, senna-docusate, sodium chloride flush   Vital Signs    Vitals:   03/31/19 0800 03/31/19 0803 03/31/19 0900 03/31/19 1000  BP: (!) 95/51  104/68 (!) 112/59  Pulse: 88  88 90  Resp: '15  15 12  ' Temp:  98.1 F (36.7 C)    TempSrc:  Axillary    SpO2:  95%  97% 96%  Weight:      Height:        Intake/Output Summary (Last 24 hours) at 03/31/2019 1026 Last data filed at 03/31/2019 1000 Gross per 24 hour  Intake 2689.01 ml  Output 2050 ml  Net 639.01 ml   Last 3 Weights 03/31/2019 03/30/2019 03/28/2019  Weight (lbs) 179 lb 0.2 oz 184 lb 8.4 oz 189 lb 9.5 oz  Weight (kg) 81.2 kg 83.7 kg 86 kg      Telemetry    Sinus rhythm with PACs with occasional?  Multifocal atrial tachycardia and?  Atrial fibrillation.  Patient appeared to have some A. fib with aberrant conduction last night with rates in the 130s.  Currently rates are in the 90s-100s- Personally Reviewed  ECG    No new tracings for review  Physical Exam   GEN:  Intubated Neck: No JVD Cardiac:  Irregularly irregular rhythm, no murmurs, rubs, or gallops.  Respiratory: Clear to auscultation bilaterally. GI: Soft, nontender, non-distended  MS: No edema; No deformity. Neuro:   Moves to touch or voice.  Intubated, nonverbal currently. Psych: Sedated  Labs    High Sensitivity Troponin:   Recent Labs  Lab 03/24/19 0950 03/29/19 0920 03/29/19 1149  TROPONINIHS 27* 30* 33*  Chemistry Recent Labs  Lab 03/25/19 0507 03/26/19 0446 03/29/19 0425 03/29/19 0425 03/30/19 0446 03/30/19 1826 03/31/19 0000  NA 141   < > 149*   < > 154* 153* 153*  K 3.7   < > 2.9*   < > 3.7 3.7 3.6  CL 114*   < > 115*  --  121*  --  122*  CO2 21*   < > 21*  --  19*  --  19*  GLUCOSE 190*   < > 124*  --  120*  --  133*  BUN 17   < > 25*  --  26*  --  28*  CREATININE 1.32*   < > 1.22  --  1.27*  --  1.31*  CALCIUM 8.5*   < > 9.4  --  9.3  --  8.9  ALBUMIN 2.2*  --   --   --   --   --   --   GFRNONAA 49*   < > 53*  --  51*  --  49*  GFRAA 56*   < > >60  --  59*  --  57*  ANIONGAP 6   < > 13  --  14  --  12   < > = values in this interval not displayed.     Hematology Recent Labs  Lab 03/29/19 0425 03/29/19 0425 03/30/19 0446 03/30/19 1626 03/30/19 1734 03/30/19 1826  03/31/19 0000  WBC 9.6  --  9.4  --   --   --  9.8  RBC 3.88*  --  3.83*  --   --   --  3.93*  HGB 12.7*   < > 12.6*   < > 13.1 11.9* 12.7*  HCT 38.4*   < > 39.4   < > 42.4 35.0* 40.5  MCV 99.0  --  102.9*  --   --   --  103.1*  MCH 32.7  --  32.9  --   --   --  32.3  MCHC 33.1  --  32.0  --   --   --  31.4  RDW 14.1  --  14.5  --   --   --  14.5  PLT 156  --  193  --   --   --  223   < > = values in this interval not displayed.    BNPNo results for input(s): BNP, PROBNP in the last 168 hours.   DDimer  Recent Labs  Lab 03/29/19 0920  DDIMER 4.72*     Radiology    DG Abd 1 View  Result Date: 03/30/2019 CLINICAL DATA:  Status post NG tube placement. EXAM: ABDOMEN - 1 VIEW COMPARISON:  None. FINDINGS: NG tube is in good position and looped in the stomach with the tip in the mid body. Feeding tube tip projects in the gastric antrum. IMPRESSION: As above. Electronically Signed   By: Inge Rise M.D.   On: 03/30/2019 18:40   CT HEAD WO CONTRAST  Result Date: 03/30/2019 CLINICAL DATA:  86 year old male with neurologic deficit. Positive right MCA emergent large vessel occlusion on 04/10/2019. Right temporal lobe and other scattered right hemisphere infarcts on MRI. EXAM: CT HEAD WITHOUT CONTRAST TECHNIQUE: Contiguous axial images were obtained from the base of the skull through the vertex without intravenous contrast. COMPARISON:  Brain MRI 03/23/2019 and earlier. FINDINGS: Brain: Confluent cytotoxic edema in the right temporal lobe. No acute intracranial hemorrhage identified. No intracranial mass effect or  midline shift. No ventriculomegaly. Scattered small additional mostly cortical infarcts in the posterior right MCA territory by MRI are largely occult by CT. Stable gray-white matter differentiation in the left hemisphere and posterior fossa. Vascular: Calcified atherosclerosis at the skull base. No suspicious intracranial vascular hyperdensity. Skull: No acute osseous abnormality  identified. Sinuses/Orbits: Right nasoenteric tube in place. Partially visible endotracheal tube. Fluid in the pharynx. New paranasal sinus mucosal thickening. New mastoid effusions, right tympanic cavity opacification. Other: No acute orbit or scalp soft tissue finding. IMPRESSION: 1. Expected appearance of the confluent right temporal lobe infarct with no associated hemorrhage or mass effect. Additional smaller right MCA infarcts demonstrated by MRI remain occult by CT. 2. No new intracranial abnormality. 3. Intubated and right nasoenteric tube in place with fluid/mucosal thickening of the paranasal sinuses, middle ears and mastoids. Electronically Signed   By: Genevie Ann M.D.   On: 03/30/2019 20:17   Portable Chest xray  Result Date: 03/31/2019 CLINICAL DATA:  Respiratory failure EXAM: PORTABLE CHEST 1 VIEW COMPARISON:  03/30/2019 FINDINGS: Endotracheal tube, feeding catheter and gastric catheter are noted in stable position. Cardiac shadow is enlarged but stable. Increasing right basilar opacity is noted with small effusion. The left lung remains clear. IMPRESSION: Increasing right basilar opacity with associated effusion. Electronically Signed   By: Inez Catalina M.D.   On: 03/31/2019 08:23   Portable Chest x-ray  Result Date: 03/30/2019 CLINICAL DATA:  Status post intubation. EXAM: PORTABLE CHEST 1 VIEW COMPARISON:  Single-view of the chest 03/29/2019. FINDINGS: Endotracheal tube is now in place with the tip in good position just below the clavicular heads. NG tube and feeding tube course below the inferior margin of the film. Hazy airspace disease is seen throughout the right chest. The left lung appears clear. The patient is markedly rotated on the study. IMPRESSION: ETT in good position. Airspace disease in the right chest most worrisome for pneumonia. Electronically Signed   By: Inge Rise M.D.   On: 03/30/2019 18:39    Cardiac Studies   Echo 03/23/2019 1. Left ventricular ejection fraction,  by estimation, is 45%. The left  ventricle has mildly decreased function. The left ventrical demonstrates  regional wall motion abnormalities (see scoring diagram/findings for  description). Akinesis of the mid to  apical anteroseptal and inferoseptal walls. Left ventricular diastolic  parameters are consistent with Grade I diastolic dysfunction (impaired  relaxation).  2. Right ventricular systolic function is mildly reduced. The right  ventricular size is mildly enlarged. There is mildly elevated pulmonary  artery systolic pressure. The estimated right ventricular systolic  pressure is 70.9 mmHg.  3. The aortic valve is tricuspid. Trivial aortic insufficiency, no aortic  stenosis.  4. No significant mitral regurgitation.  5. Dilated IVC with estimated RA pressure 15 mmHg.    Patient Profile     86 y.o. male with PMH of anemia, chronic back pain, remote tobacco use, and HTN presented on 2/7 with R MCA stroke, noted to have significant R ICA stenosis and MCA stenosis. Patient underwent TPA and R ICA stent placement.  Hospitalization has been complicated by encephalopathy, acute hypoxic respiratory failure secondary to aspiration requiring intubation, tachyarrhythmias and acute systolic and diastolic heart failure.  Cardiology initially consulted for abnormal EF 45%. Initially felt to have sinus rhythm with PACs, now has more persistent tachycardia, possible multifocal atrial tachycardia.  Assessment & Plan    Chronic combined systolic and diastolic heart failure -Echo with EF 62%, grade 1 diastolic dysfunction -Treated earlier in hospitalization  with Lasix -No signs of volume overload -Patient is not a candidate for aggressive cardiac interventions for heart failure or tachyarrhythmias at this time.  Tachyarrhythmia/Atrial fibrillation -Telemetry initially consistent with frequent PACs that appeared to be more consistent with multifocal atrial tachycardia with some moments  concerning for atrial fibrillation.  He appeared to have some atrial fibrillation with aberrant conduction last night with rates in the 130s.  Rates have not  been ideally controlled, currently 90s-100s. Have been using prn metorpolol 5 mg. He was on metoprolol 25 mg BID per tube but this was dc'd yesterday, likely due to hypotension. Pt is now off pressors and BP is stable. Would add betablocker back as tolerated.  -Patient is currently on antithrombotics with ticagrelor and aspirin.  When medically stable and taking oral medications plan to add Eliquis and change ticagrelor to Plavix.  Right MCA CVA s/p TPA -Followed by neurology.   Carotid artery disease s/p right ICA stent and angioplasty 03/31/2019 -Patient is currently on ticagrelor 90 mg twice daily, aspirin 81 mg daily and VTE prophylaxis with Lovenox.  RLL aspiration pneumonia -Management per critical care and neurology.  On IV antibiotics  Hyperlipidemia -On high intensity statin with Lipitor 40 mg daily.  LDL is 80, goal less than 70 considering his carotid artery disease.      For questions or updates, please contact [place] Please consult [email or web address] for contact info under        Signed, Daune Perch, NP  03/31/2019, 10:26 AM

## 2019-03-31 NOTE — Progress Notes (Signed)
Pt is back on full vent support due to increased RR & WOB.

## 2019-03-31 NOTE — Progress Notes (Signed)
PULMONARY / CRITICAL CARE MEDICINE   NAME:  Jonathan Berger, MRN:  [id number], DOB:  1931/10/04, LOS: 9 ADMISSION DATE:  04/10/2019, CONSULTATION DATE: 03/27/2019 REFERRING MD: Interventional radiology neurology, CHIEF COMPLAINT: Vent dependent respiratory failure secondary to procedure  BRIEF HISTORY:    86 year old presenting 2/7 with slurred speech and dysphagia found to have acute right middle cerebral artery stroke s/p tPA and EVR who returns to ICU on mechanical ventilation overnight.   SIGNIFICANT PAST MEDICAL HISTORY   Hypertension  SIGNIFICANT EVENTS:  04/03/2019 right MCA stroke 03/30/2019 required reintubation for respiratory insufficiency  STUDIES:   2/7 CTH >>  Hyperdense Right MCA suspicious for emergent large vessel occlusion.  No acute intracranial hemorrhage. 2/7 CTA head/ neck>> Positive for Right MCA Emergent Large Vessel Occlusion affecting the mid and distal M1 segment. Critical Stenosis cervical Right ICA beginning at the origin, throughout the proximal 3.5 cm of the vessel. There are tandem RADIOGRAPHIC STRING SIGN STENOSES. Additional severe atherosclerosis and multifocal high-grade arterial stenoses elsewhere: - Left ICA distal bulb RADIOGRAPHIC STRING SIGN STENOSIS. - Severe bilateral Vertebral Artery origin stenoses. - proximal Left Subclavian Artery moderate stenosis due to plaque and tortuosity. 2/8 TTE >>  Left ventricular ejection fraction, by estimation, is 45%. The left ventricle has mildly decreased function. The left ventrical demonstrates regional wall motion abnormalities (see scoring diagram/findings for description). Akinesis of the mid to apical anteroseptal and inferoseptal walls. Left ventricular diastolic parameters are consistent with Grade I diastolic dysfunction (impaired relaxation). 2. Right ventricular systolic function is mildly reduced. 2/8 MRI Brain >> 3.1 cm acute cortical/subcortical infarct within the anterolateral right temporal lobe. Numerous  additional small scattered acute cortical and subcortical infarct within the right cerebral hemisphere MCA vascular territory.  03/30/2019: CT head pending 03/30/2019: EEG pending  CULTURES:  2/7 SARS2/ Flu A/B >> neg 2/8 urine > negative 2/8 resp > normal flora  ANTIBIOTICS:  2/7 cefazolin pre op 2/9 unasyn >>  LINES/TUBES:  03/26/2019 endotracheal tube >> 211 with reintubation on 03/30/2019>> 03/21/2019 left radial A-line >>2/9 04/02/2019 right femoral sheath >>2/9 03/30/2019 endotracheal tube placement  CONSULTANTS:  03/24/2019 pulmonary critical care  03/30/2019: Pulmonary critical care consultation  SUBJECTIVE/INTERVAL:  Reintubated on 03/30/2019.  Chest x-ray with right lower lobe effusion airspace disease.  Continue to monitor for possible weaning from ventilator or need for tracheostomy in the future.             CONSTITUTIONAL: BP 131/72   Pulse 79   Temp 98.3 F (36.8 C) (Axillary)   Resp 19   Ht 5\' 11"  (1.803 m)   Wt 81.2 kg   SpO2 100%   BMI 24.97 kg/m  Vent Mode: PRVC FiO2 (%):  [40 %-100 %] 40 % Set Rate:  [18 bmp] 18 bmp Vt Set:  [600 mL] 600 mL PEEP:  [5 cmH20-10 cmH20] 5 cmH20 Plateau Pressure:  [14 cmH20-25 cmH20] 14 cmH20 PHYSICAL EXAM:  General: Frail elderly male poorly responsive on full mechanical ventilatory support HEENT: Endotracheal tube is in place core track is in place. Neuro: Poorly responsive CV: Heart sounds are distant PULM: Coarse rhonchi bilaterally decreased in the bases Vent pressure regulated volume control FIO2 currently 50% will decrease to 40 PEEP 5 RATE 18+0 VT 600   rll asdz effusion  GI: soft, bsx4 active  GU: Foley in place Extremities: warm/dry,  edema  Skin: no rashes or lesions  ASSESSMENT AND PLAN    Acute hypoxemic respiratory failure requiring intubation mechanical ventilation likely secondary to worsening altered mental status in the  setting of recent stroke. Unclear of this recent change in mental status possible conversion of stroke versus seizure however no significant seizure-like activity noted by nursing staff. Required reintubation 03/30/2019 Continue antimicrobial therapy Serial chest x-ray Chest x-ray continues to reveal right lower lobe effusion Continue daily assessments for possible extubation May need tracheostomy in the future   Possible GI bleeding Currently on PPI drip Gastric drainage noted to be bile Change PPI to twice daily  R MCA infarct s/p tPA and IR with L ICA stent placement. In and out of AF vs ST with PAC.  Per neurology Stat CT of head 03/30/2019 did not reveal any new insult  Chronic HFrEF SR with PACs Resume beta-blockers when able  Generalized deconditioning.  In the setting advanced age We will need rehab at some point  Dysphagia. Utilize core track    APP CCT 34 MIN  Steve Sailor Hevia ACNP Acute Care Nurse Practitioner [facility] Pulmonary/Critical Care Please consult Amion 03/31/2019, 8:32 AM

## 2019-03-31 NOTE — Progress Notes (Signed)
Patient PIV found to have infiltrated while Neosynephrine running at site on R forearm. IV removed, site marked and assessed, no changes in coloration or temperature but with pitting edema +2. Pharmacy notified and PRN Nitroglycerin ointment provided. Will monitor.  Aris Lot, RN

## 2019-03-31 NOTE — Progress Notes (Signed)
SLP Cancellation Note  Patient Details Name: Jonathan Berger MRN: [id number] DOB: Oct 26, 1931   Cancelled treatment:       Reason Eval/Treat Not Completed: Medical issues which prohibited therapy - emergently intubated on previous date. Please reorder SLP when ready.    Mahala Menghini., M.A. CCC-SLP Acute Rehabilitation Services Pager [phone] Office [phone]  03/31/2019, 7:23 AM

## 2019-03-31 NOTE — Progress Notes (Signed)
Nutrition Follow-up  DOCUMENTATION CODES:   Not applicable  INTERVENTION:  Change to continuous feedings -Osmolite 1.5 @ 50 ml/hr (1200 ml/day) with 30 ml Pro-stat TID via tube    Provides 2100 kcal, 120 grams of protein, and 912 ml free water (2712 ml total water with flushes)   300 ml free water flush every 4 hrs per MD, consider decreasing to every 6 hrs  NUTRITION DIAGNOSIS:   Inadequate oral intake related to inability to eat as evidenced by NPO status.  Addressing via tube feedings  GOAL:   Patient will meet greater than or equal to 90% of their needs  Progressing  MONITOR:   Labs, Weight trends, I & O's, Vent status, TF tolerance, Skin, Diet advancement  REASON FOR ASSESSMENT:   Consult Enteral/tube feeding initiation and management  ASSESSMENT:  RD working remotely.  86 year old male with past medical history of HTN , anemia, and chronic back pain presented to ED with slurred speech and left facial droop. CTA showed right ICA near occulusion with distal M1 thrombus extending into M2 branch.  Pt with new onset heart failure likely related to critical illness.   2/7 IR - R MCA embolization and revascularization 2/8 TEE - Left EF ~45% 2/9 CXR - new RLL infiltrate vs atelectasis 2/11 pt extubated 2/12 Cort[facility] - tube just inside pylorus 2/14 RRT called d/t increased respirations, decreased mental status  2/15 Patient changed to bolus feeding  -237 ml Osmolite 1.5  5x daily with 30 ml Pro-stat TID, 60 ml FWF before and after each bolus. Patient reintubated ~1730 due to respiratory distress.   CXR with right lower lobe effusion airspace disease.   Per notes, new aspiration, bloody secretions suctioned out during intubation but seems to have stopped. Patient weaning on pressure support.   RD contacted [facility] team this morning to confirm tube placement. Tube deep in the stomach just inside the pylorus. Recommend changing back to continuous feeds given patient  current respiratory status and new aspiration noted.   BP: 104/68 (cuff)  MAP: 76 (cuff)   I/O: - 4123 ml since admit    +55 ml x 24 hrs UOP: 2000 ml x 24 hrs  Patient is currently intubated on ventilator support MV: 13.3 L/min Temp (24hrs), Avg:98.8 F (37.1 C), Min:97.5 F (36.4 C), Max:100.3 F (37.9 C)  Propofol: n/a  Medications reviewed Drips:Maximpime Fentanyl  Neo 25 mcg  Labs: CBGs 146,122,106,115,116 x 24 hrs, Na 153 (H), BUN 28 (H), Cr 1.31 (H)  Diet Order:   Diet Order            Diet NPO time specified  Diet effective now              EDUCATION NEEDS:   No education needs have been identified at this time  Skin:  Skin Assessment: Reviewed RN Assessment  Last BM:  2/16 (type 1; type 4)  Height:   Ht Readings from Last 1 Encounters:  03/21/2019 5\' 11"  (1.803 m)    Weight:   Wt Readings from Last 1 Encounters:  03/31/19 81.2 kg    Ideal Body Weight:  78.2 kg  BMI:  Body mass index is 24.97 kg/m.  Estimated Nutritional Needs:   Kcal:  1983  Protein:  110-125  Fluid:  >/= 1.9 L/day   04/02/19, RD, LDN Clinical Nutrition Office [phone] After Hours/Weekend Pager: [phone]

## 2019-03-31 NOTE — Progress Notes (Signed)
Pharmacy Antibiotic Note  Jonathan Berger is a 86 y.o. male admitted on 04/01/2019 with aspiration PNA. Pharmacy has been consulted for vancomycin/ cefepime  dosing.  Afebrile, WBC wnl. Re-intubated 2/15 with hypotension. Scr trended up slightly, weight down 5kg.   2/16 Vancomycin 1000mg  Q 24 hr Scr used: 1.31 mg/dL Weight: kg Vd coeff: 0.72 L/kg Est AUC: 425  Plan: Change vancomycin to 1000 Q24 hr Continue Cefepime 2g Q12  hr Monitor cultures, clinical status, renal fx Narrow abx as able and f/u duration    Height: 5\' 11"  (180.3 cm) Weight: 179 lb 0.2 oz (81.2 kg) IBW/kg (Calculated) : 75.3  Temp (24hrs), Avg:98.8 F (37.1 C), Min:97.5 F (36.4 C), Max:100.3 F (37.9 C)  Recent Labs  Lab 03/27/19 0457 03/28/19 0500 03/29/19 0425 03/29/19 1149 03/29/19 1431 03/30/19 0446 03/31/19 0000  WBC 7.9 8.7 9.6  --   --  9.4 9.8  CREATININE 1.06 1.03 1.22  --   --  1.27* 1.31*  LATICACIDVEN  --   --   --  1.3 1.4  --   --     Estimated Creatinine Clearance: 43.1 mL/min (A) (by C-G formula based on SCr of 1.31 mg/dL (H)).    No Known Allergies  Antimicrobials this admission: Vancomycin 2/14 >>  Cefepime  2/14 >>   Dose adjustments this admission: 2/16 Vanc 1750 q48hr to 1g Q24h   Microbiology results: 2/14 BCx: ngtd 2/8 UCx: ngtd  2/8 Sputum: normal resp flora  2/8  MRSA PCR: neg    4/8, PharmD, BCPS, BCCP Clinical Pharmacist  Please check AMION for all [facility] phone numbers After 10:00 PM, call Main Pharmacy [phone]

## 2019-03-31 NOTE — Progress Notes (Signed)
STROKE TEAM PROGRESS NOTE   INTERVAL HISTORY Pt was re intubated yesterday for respiratory distress and remains on ventilatory support for respiratory failure.  Chest x-ray shows new right lower lobe infiltrate patient started on antibiotics and required sedation for his agitation.  Vitals:   03/31/19 1410 03/31/19 1411 03/31/19 1413 03/31/19 1414  BP: (!) 79/54 (!) 81/52 (!) 89/53 (S) (!) 85/55  Pulse:      Resp:      Temp:      TempSrc:      SpO2:      Weight:      Height:       CBC:  Recent Labs  Lab 03/30/19 0446 03/30/19 1626 03/30/19 1826 03/31/19 0000  WBC 9.4  --   --  9.8  HGB 12.6*   < > 11.9* 12.7*  HCT 39.4   < > 35.0* 40.5  MCV 102.9*  --   --  103.1*  PLT 193  --   --  223   < > = values in this interval not displayed.   Basic Metabolic Panel:  Recent Labs  Lab 03/27/19 0457 03/28/19 0500 03/29/19 0425 03/29/19 0425 03/30/19 0446 03/30/19 0446 03/30/19 1826 03/31/19 0000  NA 145   < > 149*   < > 154*   < > 153* 153*  K 2.9*   < > 2.9*   < > 3.7   < > 3.7 3.6  CL 106   < > 115*   < > 121*  --   --  122*  CO2 27   < > 21*   < > 19*  --   --  19*  GLUCOSE 120*   < > 124*   < > 120*  --   --  133*  BUN 17   < > 25*   < > 26*  --   --  28*  CREATININE 1.06   < > 1.22   < > 1.27*  --   --  1.31*  CALCIUM 8.6*   < > 9.4   < > 9.3  --   --  8.9  MG 1.9  --  2.1  --   --   --   --   --   PHOS 1.6*  --  1.8*  --   --   --   --   --    < > = values in this interval not displayed.   Lipid Panel:     Component Value Date/Time   CHOL 154 03/23/2019 0303   TRIG 154 (H) 03/31/2019 0000   HDL 36 (L) 03/23/2019 0303   CHOLHDL 4.3 03/23/2019 0303   VLDL 38 03/23/2019 0303   LDLCALC 80 03/23/2019 0303   HgbA1c:  Lab Results  Component Value Date   HGBA1C 5.9 (H) 03/23/2019   IMAGING past 48 hours DG Abd 1 View  Result Date: 03/30/2019 CLINICAL DATA:  Status post NG tube placement. EXAM: ABDOMEN - 1 VIEW COMPARISON:  None. FINDINGS: NG tube is in good  position and looped in the stomach with the tip in the mid body. Feeding tube tip projects in the gastric antrum. IMPRESSION: As above. Electronically Signed   By: Drusilla Kanner M.D.   On: 03/30/2019 18:40   CT HEAD WO CONTRAST  Result Date: 03/30/2019 CLINICAL DATA:  86 year old male with neurologic deficit. Positive right MCA emergent large vessel occlusion on 04-06-19. Right temporal lobe and other scattered right hemisphere infarcts on MRI.  EXAM: CT HEAD WITHOUT CONTRAST TECHNIQUE: Contiguous axial images were obtained from the base of the skull through the vertex without intravenous contrast. COMPARISON:  Brain MRI 03/23/2019 and earlier. FINDINGS: Brain: Confluent cytotoxic edema in the right temporal lobe. No acute intracranial hemorrhage identified. No intracranial mass effect or midline shift. No ventriculomegaly. Scattered small additional mostly cortical infarcts in the posterior right MCA territory by MRI are largely occult by CT. Stable gray-white matter differentiation in the left hemisphere and posterior fossa. Vascular: Calcified atherosclerosis at the skull base. No suspicious intracranial vascular hyperdensity. Skull: No acute osseous abnormality identified. Sinuses/Orbits: Right nasoenteric tube in place. Partially visible endotracheal tube. Fluid in the pharynx. New paranasal sinus mucosal thickening. New mastoid effusions, right tympanic cavity opacification. Other: No acute orbit or scalp soft tissue finding. IMPRESSION: 1. Expected appearance of the confluent right temporal lobe infarct with no associated hemorrhage or mass effect. Additional smaller right MCA infarcts demonstrated by MRI remain occult by CT. 2. No new intracranial abnormality. 3. Intubated and right nasoenteric tube in place with fluid/mucosal thickening of the paranasal sinuses, middle ears and mastoids. Electronically Signed   By: Odessa Fleming M.D.   On: 03/30/2019 20:17   Portable Chest xray  Result Date:  03/31/2019 CLINICAL DATA:  Respiratory failure EXAM: PORTABLE CHEST 1 VIEW COMPARISON:  03/30/2019 FINDINGS: Endotracheal tube, feeding catheter and gastric catheter are noted in stable position. Cardiac shadow is enlarged but stable. Increasing right basilar opacity is noted with small effusion. The left lung remains clear. IMPRESSION: Increasing right basilar opacity with associated effusion. Electronically Signed   By: Alcide Clever M.D.   On: 03/31/2019 08:23   Portable Chest x-ray  Result Date: 03/30/2019 CLINICAL DATA:  Status post intubation. EXAM: PORTABLE CHEST 1 VIEW COMPARISON:  Single-view of the chest 03/29/2019. FINDINGS: Endotracheal tube is now in place with the tip in good position just below the clavicular heads. NG tube and feeding tube course below the inferior margin of the film. Hazy airspace disease is seen throughout the right chest. The left lung appears clear. The patient is markedly rotated on the study. IMPRESSION: ETT in good position. Airspace disease in the right chest most worrisome for pneumonia. Electronically Signed   By: Drusilla Kanner M.D.   On: 03/30/2019 18:39   Cerebral Angiogram 04/27/2019 S/P bilateral common carotid arteriograms followed by complete revascularization of RT MCA pre occlusive thrombus following balloon angioplasty with stent placement at prox RT ICA for severe pre occlusive stenosis ,and balloon angioplasty of heavily calcified plaque in proc RT ICAA with a patency  Of approx 80 % .  With TICI 3 revascularization   PHYSICAL EXAM   Temp:  [98.1 F (36.7 C)-100.9 F (38.3 C)] 100.9 F (38.3 C) (02/16 1212) Pulse Rate:  [36-109] 61 (02/16 1400) Resp:  [12-48] 16 (02/16 1400) BP: (60-166)/(49-104) 85/55 (02/16 1414) SpO2:  [92 %-100 %] 96 % (02/16 1400) FiO2 (%):  [40 %-100 %] 40 % (02/16 1212) Weight:  [81.2 kg] 81.2 kg (02/16 0500)  General -  Frail elderly caucasian male. Sedated and intubated  Ophthalmologic - fundi not visualized due  to noncooperation.  Cardiovascular - irregular heart beats    Respiratory -  t  Neuro - sedated intubated.lethargic but eyes open to stimulation, agitated and restless. Not following commands. Not following commands,  . Eyes in mid position, PERRL, blinking to visual threat bilaterally. Facial symmetric. BUE and BLE4/5 symmetric movement to painful stimuli..no focal weakness   ASSESSMENT/PLAN Mr. Jonathan  ALIC Berger is a 86 y.o. male with history of HTN presenting with slurred speech and L facial droop. Received tPA 04/11/2019 at 1324. Taken for IR for distal R M1 thrombus.   Respiratory failure RLL aspiration pneumonia  intubate for IR -> Extubated 2/11   Copious secretion and SOB post extubation but stable O2 sat  CXR RLL infiltrates -> Worsening opacification   On unasyn 2/9>>2/14  Leukocytosis WBC 11.4->9.1->7.0->7.9->9.6->9.4   Temp  101.1->99.8->afebrile->100.3->afebrile->100.0->100.6   Sputum Cx nml flora  Developed Tachypnea / Tachycardia / Low O2 sats / Fever 2/13  CXR 2/14 Changes of multifocal infiltrate  Cefepime 2/14>>  Vanc 2/14>>  Supplemental O2   Lactic acid normal  BCx no growth < 24h TRH onboard -> CCM consult ->reintubated 03/30/19 Stroke:   R MCA scattered infarcts s/p tPA and IR w/ R ICA stent placement, infarct secondary to large vessel disease source  CT head hyperdense R MCA.     CTA head & neck R MCA LVO mid and distal segment. Cervical R ICA critical stenosis w/ random string sign stenoses. L ICA distal bulb string sign, severe B VA origin stenoses   Cerebral angio TICI 3 revascularization R MCA preocclusive thrombus w/ angioplasty and stent at proximal R ICA and angioplasty of 80% plaque in L ICA.   MRI  R temporal lobe cortical/ subcortical infarct w/ numerous small scattered R brain infarcts. Proximal R M2 MCA w/ loss of signal likely residual thrombus.    2D Echo EF 45%, no SOE seen  Carotid doppler -  left ICA 60-79%  stenosis.  LDL  80  HgbA1c 5.9  P2Y12 = 8  lovenox 40 for VTE prophylaxis  No antithrombotic prior to admission, now on Brilinta (ticagrelor) 90 mg bid and ASA 81mg  daily.  Continue on discharge  Therapy recommendations:  CIR  Disposition:  pending   New HFrEF, ? D/t critical illness SR w/ PACs  2D Echo EF 45%, no SOE seen  Treated w/ lasix  On metoprolol 25mg  bid  Repeat 2D as OP  On ASA and brilinta  Troponins - 27-30-33   Cardiology reconsulted  PAF  ECG - Afib with RVR rate 157; 2nd EKG sinus arrhythmia w/ tachycardia. 3rd EKG NSR w/ PVCs  EKGs reveiwed by cardiology - multifocal atrial tachycardia with intermittent Afib  On metoprolol 50 bid  D dimer - 4.72 (H)      Cardiology recommend plavix and eliquis once he is medically stable and able to take po  Carotid stenosis  CTA neck - bilateral ICA bulb string sign  S/p right ICA stenting  S/p left ICA angioplasty  CUS - left ICA  60-79%  stenosis.  Consider further left ICA intervention in the near future - Dr. Estanislado Pandy aware  Hx of Hypertension Hypotension d/t sedation, resolved  Home meds:  Valsartan-HCTZ 160-12.5, amlodipine 5  Off levophed gtt  BP stable . BP goal 110-180  . Long-term BP goal normotensive  Hyperlipidemia  Home meds:  No statin  LDL 80, goal < 70  On lipitor 40   Continue statin at discharge  Delirium / Agitation Dementia, baseline  On namenda and aricept PTA  On Seroquel 50 at bedtime  Treated w/ IV atival 0.5 q 4h since 2/13. Stopped 2/15 -> likely a contributing factor for aspiration pneumonia  In 4-point restraints per PT   Dysphagia . Secondary to stroke . NPO . TF via Cortrak . TF changed to bolus feeds.   Marland Kitchen Speech on board  Other Stroke Risk Factors  Advanced age  Former Cigarette smoker, quit 45 yrs ago  ETOH use, will advise to drink no more than 2 drink(s) a day   Other Active Problems  Urinary retention, foley placed  Thrombocytopenia -  platelet 159->130->clump->120->142->156->193   Hypokalemia 4.6-2.9-2.9 - supplement - 2.8 ->supplement ->2.9->supplement ->3.7   Hypophosphatemia 2.1-1.3-1.6 - supplement->1.8   Anemia - Hb - 11.2->12.0->12.7  CRE 1.03->1.22->1.27    Hospital day # 9  Patient was reintubated yesterday for inability to protect airway and remains on ventilatory support for respiratory failure.  He also has A. fib but there was concern for GI bleed hence we will hold off on IV heparin for 1 more day.  Discussed with Dr. Vassie Loll.  Continue aspirin and Brilinta but will add heparin soon Discussed with wife at bedside This patient is critically ill and at significant risk of neurological worsening, death and care requires constant monitoring of vital signs, hemodynamics,respiratory and cardiac monitoring, extensive review of multiple databases, frequent neurological assessment, discussion with family, other specialists and medical decision making of high complexity.I have made any additions or clarifications directly to the above note.This critical care time does not reflect procedure time, or teaching time or supervisory time of PA/NP/Med Resident etc but could involve care discussion time.  I spent 30 minutes of neurocritical care time  in the care of  this patient.    Stroke Neurology 03/31/2019 2:33 PM       To contact Stroke Continuity provider, please refer to [email or web address]. After hours, contact General Neurology

## 2019-03-31 NOTE — Progress Notes (Signed)
PT Cancellation Note  Patient Details Name: Jonathan Berger MRN: [id number] DOB: March 05, 1931   Cancelled Treatment:    Reason Eval/Treat Not Completed: Medical issues which prohibited therapy; patient re-intubated overnight and noted back on some sedation.  Will attempt again another day.   [name] 03/31/2019, 4:20 PM Sheran Lawless, PT Acute Rehabilitation Services [phone] 03/31/2019

## 2019-03-31 NOTE — Progress Notes (Signed)
CPT via the bed started and stopped due to increased HR in the 140's

## 2019-04-01 ENCOUNTER — Inpatient Hospital Stay (HOSPITAL_COMMUNITY): Payer: Medicare Other

## 2019-04-01 LAB — BASIC METABOLIC PANEL
Anion gap: 5 (ref 5–15)
BUN: 34 mg/dL — ABNORMAL HIGH (ref 8–23)
CO2: 21 mmol/L — ABNORMAL LOW (ref 22–32)
Calcium: 8.6 mg/dL — ABNORMAL LOW (ref 8.9–10.3)
Chloride: 123 mmol/L — ABNORMAL HIGH (ref 98–111)
Creatinine, Ser: 1.22 mg/dL (ref 0.61–1.24)
GFR calc Af Amer: >60 mL/min (ref >60)
GFR calc non Af Amer: 53 mL/min — ABNORMAL LOW (ref >60)
Glucose, Bld: 183 mg/dL — ABNORMAL HIGH (ref 70–99)
Potassium: 3.6 mmol/L (ref 3.5–5.1)
Sodium: 149 mmol/L — ABNORMAL HIGH (ref 135–145)

## 2019-04-01 LAB — CBC WITH DIFFERENTIAL/PLATELET
Abs Immature Granulocytes: 0.08 10*3/uL — ABNORMAL HIGH (ref 0.00–0.07)
Basophils Absolute: 0 10*3/uL (ref 0.0–0.1)
Basophils Relative: 0 %
Eosinophils Absolute: 0.1 10*3/uL (ref 0.0–0.5)
Eosinophils Relative: 1 %
HCT: 33.9 % — ABNORMAL LOW (ref 39.0–52.0)
Hemoglobin: 10.7 g/dL — ABNORMAL LOW (ref 13.0–17.0)
Immature Granulocytes: 1 %
Lymphocytes Relative: 13 %
Lymphs Abs: 1.1 10*3/uL (ref 0.7–4.0)
MCH: 32.5 pg (ref 26.0–34.0)
MCHC: 31.6 g/dL (ref 30.0–36.0)
MCV: 103 fL — ABNORMAL HIGH (ref 80.0–100.0)
Monocytes Absolute: 0.5 10*3/uL (ref 0.1–1.0)
Monocytes Relative: 6 %
Neutro Abs: 7.1 10*3/uL (ref 1.7–7.7)
Neutrophils Relative %: 79 %
Platelets: 181 10*3/uL (ref 150–400)
RBC: 3.29 MIL/uL — ABNORMAL LOW (ref 4.22–5.81)
RDW: 14.6 % (ref 11.5–15.5)
WBC: 8.9 10*3/uL (ref 4.0–10.5)
nRBC: 0 % (ref 0.0–0.2)

## 2019-04-01 LAB — PHOSPHORUS: Phosphorus: 2.1 mg/dL — ABNORMAL LOW (ref 2.5–4.6)

## 2019-04-01 LAB — GLUCOSE, CAPILLARY: Glucose-Capillary: 156 mg/dL — ABNORMAL HIGH (ref 70–99)

## 2019-04-01 LAB — MAGNESIUM: Magnesium: 2.2 mg/dL (ref 1.7–2.4)

## 2019-04-01 MED ORDER — FREE WATER
350.0000 mL | Status: DC
  Administered 2019-04-01 – 2019-04-04 (×20): 350 mL

## 2019-04-01 MED ORDER — PANTOPRAZOLE SODIUM 40 MG PO PACK
40.0000 mg | PACK | Freq: Two times a day (BID) | ORAL | Status: DC
  Administered 2019-04-01 – 2019-04-17 (×32): 40 mg
  Filled 2019-04-01 (×32): qty 20

## 2019-04-01 MED ORDER — DOCUSATE SODIUM 100 MG PO CAPS: 100.0000 mg | ORAL_CAPSULE | Freq: Two times a day (BID) | ORAL | Status: DC | PRN

## 2019-04-01 MED ORDER — BISACODYL 10 MG RE SUPP
10.0000 mg | Freq: Every day | RECTAL | Status: DC | PRN
  Administered 2019-04-01: 15:00:00 10 mg via RECTAL
  Filled 2019-04-01: qty 1

## 2019-04-01 MED ORDER — K PHOS MONO-SOD PHOS DI & MONO 155-852-130 MG PO TABS
500.0000 mg | ORAL_TABLET | ORAL | Status: AC
  Administered 2019-04-01 (×2): 500 mg
  Filled 2019-04-01 (×2): qty 2

## 2019-04-01 MED ORDER — K PHOS MONO-SOD PHOS DI & MONO 155-852-130 MG PO TABS
500.0000 mg | ORAL_TABLET | ORAL | Status: DC
  Filled 2019-04-01 (×2): qty 2

## 2019-04-01 MED ORDER — POTASSIUM PHOSPHATE MONOBASIC 500 MG PO TABS: 500.0000 mg | ORAL_TABLET | ORAL | Status: DC

## 2019-04-01 NOTE — Progress Notes (Signed)
PULMONARY / CRITICAL CARE MEDICINE   NAME:  Jonathan Berger, MRN:  [id number], DOB:  [birth year], LOS: 10 ADMISSION DATE:  04/02/19, CONSULTATION DATE: 02-Apr-2019 REFERRING MD: Interventional radiology neurology, CHIEF COMPLAINT: Vent dependent respiratory failure secondary to procedure  BRIEF HISTORY:    86 year old presenting 2/7 with slurred speech and dysphagia found to have acute right middle cerebral artery stroke s/p tPA and EVR who returns to ICU on mechanical ventilation overnight.   SIGNIFICANT PAST MEDICAL HISTORY   Hypertension  SIGNIFICANT EVENTS:  04-02-19 right MCA stroke 03/30/2019 required reintubation for respiratory insufficiency  STUDIES:   2/7 CTH >>  Hyperdense Right MCA suspicious for emergent large vessel occlusion.  No acute intracranial hemorrhage. 2/7 CTA head/ neck>> Positive for Right MCA Emergent Large Vessel Occlusion affecting the mid and distal M1 segment. Critical Stenosis cervical Right ICA beginning at the origin, throughout the proximal 3.5 cm of the vessel. There are tandem RADIOGRAPHIC STRING SIGN STENOSES. Additional severe atherosclerosis and multifocal high-grade arterial stenoses elsewhere: - Left ICA distal bulb RADIOGRAPHIC STRING SIGN STENOSIS. - Severe bilateral Vertebral Artery origin stenoses. - proximal Left Subclavian Artery moderate stenosis due to plaque and tortuosity. 2/8 TTE >>  Left ventricular ejection fraction, by estimation, is 45%. The left ventricle has mildly decreased function. The left ventrical demonstrates regional wall motion abnormalities (see scoring diagram/findings for description). Akinesis of the mid to apical anteroseptal and inferoseptal walls. Left ventricular diastolic parameters are consistent with Grade I diastolic dysfunction (impaired relaxation). 2. Right ventricular systolic function is mildly reduced. 2/8 MRI Brain >> 3.1 cm acute cortical/subcortical infarct within the anterolateral right temporal lobe. Numerous  additional small scattered acute cortical and subcortical infarct within the right cerebral hemisphere MCA vascular territory.  03/30/2019: CT head pending 03/30/2019: EEG pending  CULTURES:  2/7 SARS2/ Flu A/B >> neg 2/8 urine > negative 2/8 resp > normal flora  ANTIBIOTICS:  2/7 cefazolin pre op 2/9 unasyn >>  LINES/TUBES:  April 02, 2019 endotracheal tube >> 211 with reintubation on 03/30/2019>> 04/02/19 left radial A-line >>2/9 04/02/2019 right femoral sheath >>2/9 03/30/2019 endotracheal tube placement  CONSULTANTS:  04/02/19 pulmonary critical care  03/30/2019: Pulmonary critical care consultation  SUBJECTIVE/INTERVAL:  Sleepy this AM but remains somewhat agitated.         Off neo.  CONSTITUTIONAL: BP (!) 109/53   Pulse (!) 56   Temp 98.5 F (36.9 C) (Axillary)   Resp 16   Ht 5\' 11"  (1.803 m)   Wt 82.6 kg   SpO2 97%   BMI 25.40 kg/m  Vent Mode: PRVC FiO2 (%):  [40 %] 40 % Set Rate:  [16 bmp-18 bmp] 16 bmp Vt Set:  [600 mL] 600 mL PEEP:  [5 cmH20] 5 cmH20 Pressure Support:  [5 cmH20-10 cmH20] 10 cmH20 Plateau Pressure:  [13 cmH20-22 cmH20] 22 cmH20   PHYSICAL EXAM:  General: Frail elderly male sleepy but remains agitated HEENT: Endotracheal tube is in place core track is in place Neuro: Sedated and minimally responsive CV: Heart sounds are distant PULM: Coarse rhonchi bilaterally decreased in the bases GI: soft, bsx4 active  GU: Foley in place Extremities: warm/dry,  edema  Skin: no rashes or lesions  ASSESSMENT AND PLAN    Acute hypoxemic respiratory failure requiring intubation mechanical ventilation likely secondary to worsening altered mental status in the setting of recent stroke. Required re-intubation 03/30/19 - initially unclear weather had stroke conversion / new insult such as seizures; however, repeat CT and EEG negative. Unclear of this recent change in mental status possible  conversion of stroke versus seizure however no significant seizure-like activity noted by nursing staff. Continue full vent support and start weaning as sedation is weaned. Continue daily assessments for possible extubation, mental status remains barrier at this point. May need tracheostomy in the future.  Aspiration PNA. Continue abx. Follow cultures. Follow CXR.  Possible GI bleeding. Continue PPI BID. Gastric drainage noted to be bile.  R MCA infarct s/p tPA and IR with L ICA stent placement. Per neurology.  Chronic HFrEF SR with PACs Resume beta-blockers when able.  Generalized deconditioning.  In the setting advanced age. We will need rehab at some point.  Dysphagia. Utilize core track.  Constipation. Continue dulxolax and add colace.  Mild hypernatremia. Continue free water per tube.   CC time: 30 min.   Montey Hora, [facility] 04/01/2019, 8:34 AM

## 2019-04-01 NOTE — Progress Notes (Signed)
Pt didn't receive bed pt throughout the night due to patient becoming agitated.

## 2019-04-01 NOTE — Progress Notes (Signed)
STROKE TEAM PROGRESS NOTE   INTERVAL HISTORY Pt is on mild sedation with  Precedex and remains on ventilatory support for respiratory failure.  Chest x-ray shows improvement and he remains on on antibiotics.  He is easily arousable and follows commands well and moves all 4 extremities purposefully against gravity.  Vitals:   04/01/19 1000 04/01/19 1200 04/01/19 1300 04/01/19 1330  BP: (!) 109/57  138/78 (!) 128/56  Pulse:      Resp:      Temp:  98.8 F (37.1 C)    TempSrc:  Axillary    SpO2:      Weight:      Height:       CBC:  Recent Labs  Lab 03/31/19 0000 04/01/19 0423  WBC 9.8 8.9  NEUTROABS  --  7.1  HGB 12.7* 10.7*  HCT 40.5 33.9*  MCV 103.1* 103.0*  PLT 223 181   Basic Metabolic Panel:  Recent Labs  Lab 03/29/19 0425 03/30/19 0446 03/31/19 0000 04/01/19 0423  NA 149*   < > 153* 149*  K 2.9*   < > 3.6 3.6  CL 115*   < > 122* 123*  CO2 21*   < > 19* 21*  GLUCOSE 124*   < > 133* 183*  BUN 25*   < > 28* 34*  CREATININE 1.22   < > 1.31* 1.22  CALCIUM 9.4   < > 8.9 8.6*  MG 2.1  --   --  2.2  PHOS 1.8*  --   --  2.1*   < > = values in this interval not displayed.   Lipid Panel:     Component Value Date/Time   CHOL 154 03/23/2019 0303   TRIG 154 (H) 03/31/2019 0000   HDL 36 (L) 03/23/2019 0303   CHOLHDL 4.3 03/23/2019 0303   VLDL 38 03/23/2019 0303   LDLCALC 80 03/23/2019 0303   HgbA1c:  Lab Results  Component Value Date   HGBA1C 5.9 (H) 03/23/2019   IMAGING past 48 hours DG Abd 1 View  Result Date: 03/30/2019 CLINICAL DATA:  Status post NG tube placement. EXAM: ABDOMEN - 1 VIEW COMPARISON:  None. FINDINGS: NG tube is in good position and looped in the stomach with the tip in the mid body. Feeding tube tip projects in the gastric antrum. IMPRESSION: As above. Electronically Signed   By: Drusilla Kanner M.D.   On: 03/30/2019 18:40   CT HEAD WO CONTRAST  Result Date: 03/30/2019 CLINICAL DATA:  86 year old male with neurologic deficit. Positive  right MCA emergent large vessel occlusion on 2019-04-13. Right temporal lobe and other scattered right hemisphere infarcts on MRI. EXAM: CT HEAD WITHOUT CONTRAST TECHNIQUE: Contiguous axial images were obtained from the base of the skull through the vertex without intravenous contrast. COMPARISON:  Brain MRI 03/23/2019 and earlier. FINDINGS: Brain: Confluent cytotoxic edema in the right temporal lobe. No acute intracranial hemorrhage identified. No intracranial mass effect or midline shift. No ventriculomegaly. Scattered small additional mostly cortical infarcts in the posterior right MCA territory by MRI are largely occult by CT. Stable gray-white matter differentiation in the left hemisphere and posterior fossa. Vascular: Calcified atherosclerosis at the skull base. No suspicious intracranial vascular hyperdensity. Skull: No acute osseous abnormality identified. Sinuses/Orbits: Right nasoenteric tube in place. Partially visible endotracheal tube. Fluid in the pharynx. New paranasal sinus mucosal thickening. New mastoid effusions, right tympanic cavity opacification. Other: No acute orbit or scalp soft tissue finding. IMPRESSION: 1. Expected appearance of the confluent right temporal  lobe infarct with no associated hemorrhage or mass effect. Additional smaller right MCA infarcts demonstrated by MRI remain occult by CT. 2. No new intracranial abnormality. 3. Intubated and right nasoenteric tube in place with fluid/mucosal thickening of the paranasal sinuses, middle ears and mastoids. Electronically Signed   By: Genevie Ann M.D.   On: 03/30/2019 20:17   DG Chest Port 1 View  Result Date: 04/01/2019 CLINICAL DATA:  Respiratory failure EXAM: PORTABLE CHEST 1 VIEW COMPARISON:  Chest radiograph from the prior day. FINDINGS: An endotracheal tube terminates in the midthoracic trachea. Two enteric tubes enter the stomach, 1 terminates below the field of view and the other terminates in the stomach. The heart remains  enlarged. There is unchanged mild left basilar atelectasis/airspace disease. Right basilar atelectasis/airspace disease has decreased. There is no pleural effusion or pneumothorax. IMPRESSION: 1. Stable mild left basilar atelectasis/airspace disease. 2. Right basilar atelectasis/airspace disease has decreased. Electronically Signed   By: Zerita Boers M.D.   On: 04/01/2019 08:24   Portable Chest xray  Result Date: 03/31/2019 CLINICAL DATA:  Respiratory failure EXAM: PORTABLE CHEST 1 VIEW COMPARISON:  03/30/2019 FINDINGS: Endotracheal tube, feeding catheter and gastric catheter are noted in stable position. Cardiac shadow is enlarged but stable. Increasing right basilar opacity is noted with small effusion. The left lung remains clear. IMPRESSION: Increasing right basilar opacity with associated effusion. Electronically Signed   By: Inez Catalina M.D.   On: 03/31/2019 08:23   Portable Chest x-ray  Result Date: 03/30/2019 CLINICAL DATA:  Status post intubation. EXAM: PORTABLE CHEST 1 VIEW COMPARISON:  Single-view of the chest 03/29/2019. FINDINGS: Endotracheal tube is now in place with the tip in good position just below the clavicular heads. NG tube and feeding tube course below the inferior margin of the film. Hazy airspace disease is seen throughout the right chest. The left lung appears clear. The patient is markedly rotated on the study. IMPRESSION: ETT in good position. Airspace disease in the right chest most worrisome for pneumonia. Electronically Signed   By: Inge Rise M.D.   On: 03/30/2019 18:39   EEG adult  Result Date: 03/31/2019 Lora Havens, MD     03/31/2019  4:22 PM Patient Name: Jonathan Berger MRN: [id number] Epilepsy Attending: Lora Havens Referring Physician/Provider: Merlene Laughter, NP Date: 03/30/2019 Duration: 22.38 mins Patient history: 86 y.o. male with history of HTN presenting with slurred speech and L facial droop, diagnosed with right temporal lobe infarct. EEG to  evaluate for seizure. Level of alertness: lethargic AEDs during EEG study: Versed Technical aspects: This EEG study was done with scalp electrodes positioned according to the 10-20 International system of electrode placement. Electrical activity was acquired at a sampling rate of 500Hz  and reviewed with a high frequency filter of 70Hz  and a low frequency filter of 1Hz . EEG data were recorded continuously and digitally stored. DESCRIPTION: EEG showed continuous generalized 3-6Hz  theta-delta slowing. EEG was reactive to tactile stimuli. Hyperventilation and photic stimulation were not performed. ABNORMALITY - Continuous slow, generalized IMPRESSION: This study is suggestive of moderate to severe diffuse encephalopathy, non specific to etiology. No seizures or epileptiform discharges were seen throughout the recording. [place]   Cerebral Angiogram 03/28/2019 S/P bilateral common carotid arteriograms followed by complete revascularization of RT MCA pre occlusive thrombus following balloon angioplasty with stent placement at prox RT ICA for severe pre occlusive stenosis ,and balloon angioplasty of heavily calcified plaque in proc RT ICAA with a patency  Of approx 80 % .  With TICI 3 revascularization   PHYSICAL EXAM   Temp:  [98.5 F (36.9 C)-99.4 F (37.4 C)] 98.8 F (37.1 C) (02/17 1200) Pulse Rate:  [32-102] 56 (02/17 0736) Resp:  [12-27] 16 (02/17 0736) BP: (79-154)/(49-96) 128/56 (02/17 1330) SpO2:  [95 %-100 %] 97 % (02/17 0736) FiO2 (%):  [40 %] 40 % (02/17 0913) Weight:  [82.6 kg] 82.6 kg (02/17 0500)  General -  Frail elderly caucasian male. Sedated and intubated  Ophthalmologic - fundi not visualized due to noncooperation.  Cardiovascular - irregular heart beats    Respiratory -  t  Neuro - sedated intubated.lethargic but eyes open to stimulation, follows commands quite well. ,  . Eyes in mid position, PERRL, blinking to visual threat bilaterally. Facial symmetric. BUE and  BLE4/5 symmetric movement to command i..no focal weakness   ASSESSMENT/PLAN Jonathan Berger is a 86 y.o. male with history of HTN presenting with slurred speech and L facial droop. Received tPA 04/08/2019 at 1324. Taken for IR for distal R M1 thrombus.   Respiratory failure RLL aspiration pneumonia  intubate for IR -> Extubated 2/11   Copious secretion and SOB post extubation but stable O2 sat  CXR RLL infiltrates -> Worsening opacification   On unasyn 2/9>>2/14  Leukocytosis WBC 11.4->9.1->7.0->7.9->9.6->9.4   Temp  101.1->99.8->afebrile->100.3->afebrile->100.0->100.6   Sputum Cx nml flora  Developed Tachypnea / Tachycardia / Low O2 sats / Fever 2/13  CXR 2/14 Changes of multifocal infiltrate  Cefepime 2/14>>  Vanc 2/14>>  Supplemental O2   Lactic acid normal  BCx no growth < 24h TRH onboard -> CCM consult ->reintubated 03/30/19 Stroke:   R MCA scattered infarcts s/p tPA and IR w/ R ICA stent placement, infarct secondary to large vessel disease source  CT head hyperdense R MCA.     CTA head & neck R MCA LVO mid and distal segment. Cervical R ICA critical stenosis w/ random string sign stenoses. L ICA distal bulb string sign, severe B VA origin stenoses   Cerebral angio TICI 3 revascularization R MCA preocclusive thrombus w/ angioplasty and stent at proximal R ICA and angioplasty of 80% plaque in L ICA.   MRI  R temporal lobe cortical/ subcortical infarct w/ numerous small scattered R brain infarcts. Proximal R M2 MCA w/ loss of signal likely residual thrombus.    2D Echo EF 45%, no SOE seen  Carotid doppler -  left ICA 60-79%  stenosis.  LDL 80  HgbA1c 5.9  P2Y12 = 8  lovenox 40 for VTE prophylaxis  No antithrombotic prior to admission, now on Brilinta (ticagrelor) 90 mg bid and ASA 81mg  daily.  Continue on discharge  Therapy recommendations:  CIR  Disposition:  pending   New HFrEF, ? D/t critical illness SR w/ PACs  2D Echo EF 45%, no SOE  seen  Treated w/ lasix  On metoprolol 25mg  bid  Repeat 2D as OP  On ASA and brilinta  Troponins - 27-30-33   Cardiology reconsulted  PAF  ECG - Afib with RVR rate 157; 2nd EKG sinus arrhythmia w/ tachycardia. 3rd EKG NSR w/ PVCs  EKGs reveiwed by cardiology - multifocal atrial tachycardia with intermittent Afib  On metoprolol 50 bid  D dimer - 4.72 (H)      Cardiology recommend plavix and eliquis once he is medically stable and able to take po  Carotid stenosis  CTA neck - bilateral ICA bulb string sign  S/p right ICA stenting  S/p left ICA angioplasty  CUS -  left ICA  60-79%  stenosis.  Consider further left ICA intervention in the near future - Dr. Corliss Skains aware  Hx of Hypertension Hypotension d/t sedation, resolved  Home meds:  Valsartan-HCTZ 160-12.5, amlodipine 5  Off levophed gtt  BP stable . BP goal 110-180  . Long-term BP goal normotensive  Hyperlipidemia  Home meds:  No statin  LDL 80, goal < 70  On lipitor 40   Continue statin at discharge  Delirium / Agitation Dementia, baseline  On namenda and aricept PTA  On Seroquel 50 at bedtime  Treated w/ IV atival 0.5 q 4h since 2/13. Stopped 2/15 -> likely a contributing factor for aspiration pneumonia  In 4-point restraints per PT   Dysphagia . Secondary to stroke . NPO . TF via Cortrak . TF changed to bolus feeds.   Marland Kitchen Speech on board  Other Stroke Risk Factors  Advanced age  Former Cigarette smoker, quit 45 yrs ago  ETOH use, will advise to drink no more than 2 drink(s) a day   Other Active Problems  Urinary retention, foley placed  Thrombocytopenia - platelet 159->130->clump->120->142->156->193   Hypokalemia 4.6-2.9-2.9 - supplement - 2.8 ->supplement ->2.9->supplement ->3.7   Hypophosphatemia 2.1-1.3-1.6 - supplement->1.8   Anemia - Hb - 11.2->12.0->12.7  CRE 1.03->1.22->1.27    Hospital day # 10  Patient seems to be doing much better and tolerating weaning  and likely will be extubated soon.  He also has A. fib but there was concern for GI bleed hence we will hold off on IV heparin due to declining hematocrit .  I spoke to the patient's wife yesterday about goals of care and she agreed to DNR and one-way extubation.  Discussed with Dr. Vassie Loll.  Continue aspirin and Brilinta but will add heparin soon This patient is critically ill and at significant risk of neurological worsening, death and care requires constant monitoring of vital signs, hemodynamics,respiratory and cardiac monitoring, extensive review of multiple databases, frequent neurological assessment, discussion with family, other specialists and medical decision making of high complexity.I have made any additions or clarifications directly to the above note.This critical care time does not reflect procedure time, or teaching time or supervisory time of PA/NP/Med Resident etc but could involve care discussion time.  I spent 30 minutes of neurocritical care time  in the care of  this patient.   Delia Heady, MD Stroke Neurology 04/01/2019 2:22 PM       To contact Stroke Continuity provider, please refer to [email or web address]. After hours, contact General Neurology

## 2019-04-01 NOTE — Procedures (Signed)
Extubation Procedure Note  Patient Details:   Name: Jonathan Berger DOB: 1931/08/18 MRN: [id number]   Airway Documentation:    Vent end date: 04/01/19 Vent end time: 1030   Evaluation  O2 sats: stable throughout Complications: No apparent complications Patient did tolerate procedure well. Bilateral Breath Sounds: Clear   Yes   Patient extubated to 4lnc. Vitals signs stable. RT will continue to monitor.  Berger, Jonathan 04/01/2019, 10:43 AM

## 2019-04-01 NOTE — Progress Notes (Signed)
PT became aggitaed at the start of chest CPT

## 2019-04-01 NOTE — Progress Notes (Signed)
Physical Therapy Treatment Patient Details Name: Jonathan Berger MRN: [id number] DOB: August 08, 1931 Today's Date: 04/01/2019    History of Present Illness 86 year old presenting 2/7 with slurred speech and dysphagia found to have acute right middle cerebral artery stroke s/p tPA and EVR R MCA and ICA with stent on 03/23/2019.  Extubated 2/11, reintubated 2/15-2/17.    PT Comments    Pt extubated today, and pt and pt's wife eager for pt to participate in OOB activity. Pt required increased assist for bed mobility and transfers vs pre-reintubation, and PT/OT unable to progress to ambulation this session due to pt fatigue and needing to have BM. Pt also with significant ataxia, especially when first sitting EOB and upon initial transfer. Pt with + orthostasis with mobility this session, with brief period of s/s on [facility] which passed. PT continuing to recommend CIR to maximize pt mobility and independence post-acutely.    Follow Up Recommendations  CIR     Equipment Recommendations  Other (comment)(TBA)    Recommendations for Other Services       Precautions / Restrictions Precautions Precautions: Fall Restrictions Weight Bearing Restrictions: No    Mobility  Bed Mobility Overal bed mobility: Needs Assistance Bed Mobility: Rolling;Sidelying to Sit Rolling: Mod assist   Supine to sit: Mod assist;+2 for physical assistance;+2 for safety/equipment;HOB elevated     General bed mobility comments: Mod assist for rolling bilaterally for trunk translation, mod assist +2 for sidelying to sit for LE and trunk management, scooting back as pt very close to EOB upon sitting and unable to correct.  Transfers Overall transfer level: Needs assistance Equipment used: Ambulation equipment used Transfers: Sit to/from [employer] Sit to Stand: Mod assist;+2 safety/equipment;+2 physical assistance;From elevated surface         General transfer comment: Mod +2 for power up, hip  extension, and steadying. Verbal cuing for safe hand placement when rising. Sit to stand x3, from EOB x2 and from [facility] x1; with 2 mini-stands from stedy.  Ambulation/Gait             General Gait Details: unable this session   Stairs             Wheelchair Mobility    Modified Rankin (Stroke Patients Only) Modified Rankin (Stroke Patients Only) Pre-Morbid Rankin Score: No symptoms Modified Rankin: Moderately severe disability     Balance Overall balance assessment: Needs assistance Sitting-balance support: Feet supported;Bilateral upper extremity supported Sitting balance-Leahy Scale: Fair Sitting balance - Comments: Upon first sitting EOB, pt with heavy L lateral leaning with RUE pushing, requiring placing hand on bed vs on bedrail and verbal cuing for midline. Fair sitting balance once sitting in midline   Standing balance support: Bilateral upper extremity supported Standing balance-Leahy Scale: Poor Standing balance comment: reliant on external support                            Cognition Arousal/Alertness: Awake/alert Behavior During Therapy: Impulsive;Flat affect Overall Cognitive Status: Impaired/Different from baseline Area of Impairment: Memory;Orientation;Attention;Following commands;Problem solving                 Orientation Level: Disoriented to;Situation;Time Current Attention Level: Sustained Memory: Decreased short-term memory Following Commands: Follows one step commands with increased time;Follows one step commands inconsistently Safety/Judgement: Decreased awareness of safety;Decreased awareness of deficits   Problem Solving: Slow processing;Requires verbal cues;Requires tactile cues;Difficulty sequencing General Comments: Pt required multiple verbal cues and hints to know month, day.  Pt attempts to move without assist, pt unable and is high fall risk.      Exercises General Exercises - Lower Extremity Hip Flexion/Marching:  AROM;Both;5 reps;Standing    General Comments General comments (skin integrity, edema, etc.): + orthostasis, from SBP 140 pre-transfer to 110 after post-transfer. RN notified.      Pertinent Vitals/Pain Pain Assessment: No/denies pain Faces Pain Scale: No hurt Pain Intervention(s): Limited activity within patient's tolerance;Monitored during session    Home Living                      Prior Function            PT Goals (current goals can now be found in the care plan section) Acute Rehab PT Goals PT Goal Formulation: With family Time For Goal Achievement: 04/07/19 Potential to Achieve Goals: Fair Progress towards PT goals: Progressing toward goals    Frequency    Min 4X/week      PT Plan Current plan remains appropriate    Co-evaluation PT/OT/SLP Co-Evaluation/Treatment: Yes Reason for Co-Treatment: To address functional/ADL transfers;For patient/therapist safety PT goals addressed during session: Mobility/safety with mobility;Balance;Strengthening/ROM        AM-PAC PT "6 Clicks" Mobility   Outcome Measure  Help needed turning from your back to your side while in a flat bed without using bedrails?: A Lot Help needed moving from lying on your back to sitting on the side of a flat bed without using bedrails?: A Lot Help needed moving to and from a bed to a chair (including a wheelchair)?: A Lot Help needed standing up from a chair using your arms (e.g., wheelchair or bedside chair)?: A Lot Help needed to walk in hospital room?: Total Help needed climbing 3-5 steps with a railing? : Total 6 Click Score: 10    End of Session Equipment Utilized During Treatment: Oxygen;Gait belt Activity Tolerance: Patient limited by fatigue Patient left: in chair;with call bell/phone within reach;with chair alarm set;with family/visitor present Nurse Communication: Mobility status;Need for lift equipment(bari stedy for back to bed) PT Visit Diagnosis: Other abnormalities  of gait and mobility (R26.89);Muscle weakness (generalized) (M62.81);Other symptoms and signs involving the nervous system (R29.898)     Time: 9509-3267 PT Time Calculation (min) (ACUTE ONLY): 41 min  Charges:  $Therapeutic Activity: 23-37 mins                     Malaijah Houchen E, PT Acute Rehabilitation Services Pager [phone]  Office [phone]  Toy Eisemann D Elonda Husky 04/01/2019, 5:28 PM

## 2019-04-01 NOTE — Progress Notes (Signed)
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  Extubated 2/11, reintubated 2/15-2/17.   OT comments  Pt continuing to make good progress toward stated OT goals. Pt recently extubated again this date and on 2L for session. Pt able to complete bed mobility with mod A +2, appeared to have increasing ataxia and difficulty motor planning once EOB. Pt then completed transfer with stedy at mod A +2 to [facility] for BM. Pt continues to present with cognitive deficits as described below. Noted pt to be + for orthostasis with transfer but has been supine for sometime considering intubation period. Wife present this session and supportive. Continue to recommend CIR as d/c plan. Will continue to follow.    Follow Up Recommendations  CIR;Supervision/Assistance - 24 hour    Equipment Recommendations  Other (comment)(TBD)    Recommendations for Other Services      Precautions / Restrictions Precautions Precautions: Fall Restrictions Weight Bearing Restrictions: No       Mobility Bed Mobility Overal bed mobility: Needs Assistance Bed Mobility: Rolling;Sidelying to Sit Rolling: Mod assist   Supine to sit: Mod assist;+2 for physical assistance;+2 for safety/equipment;HOB elevated     General bed mobility comments: Mod assist for rolling bilaterally for trunk translation, mod assist +2 for sidelying to sit for LE and trunk management, scooting back as pt very close to EOB upon sitting and unable to correct.  Transfers Overall transfer level: Needs assistance Equipment used: Ambulation equipment used Transfers: Sit to/from [employer] Sit to Stand: Mod assist;+2 safety/equipment;+2 physical assistance;From  elevated surface         General transfer comment: Mod +2 for power up, hip extension, and steadying. Verbal cuing for safe hand placement when rising. Sit to stand x3, from EOB x2 and from [facility] x1; with 2 mini-stands from stedy.    Balance Overall balance assessment: Needs assistance Sitting-balance support: Feet supported;Bilateral upper extremity supported Sitting balance-Leahy Scale: Fair Sitting balance - Comments: Upon first sitting EOB, pt with heavy L lateral leaning with RUE pushing, requiring placing hand on bed vs on bedrail and verbal cuing for midline. Fair sitting balance once sitting in midline   Standing balance support: Bilateral upper extremity supported Standing balance-Leahy Scale: Poor Standing balance comment: reliant on external support                           ADL either performed or assessed with clinical judgement   ADL Overall ADL's : Needs assistance/impaired Eating/Feeding: NPO Eating/Feeding Details (indicate cue type and reason): coretrak Grooming: Sitting;Minimal assistance;Cueing for safety;Cueing for sequencing                   Toilet Transfer: +2 for physical assistance;+2 for safety/equipment;RW;Cueing for safety;Cueing for sequencing;Moderate assistance Toilet Transfer Details (indicate cue type and reason): with use of stedy to [facility] then recliner Toileting- Clothing Manipulation and Hygiene: Maximal assistance;Sit to/from stand Toileting - Clothing Manipulation Details (indicate cue type and reason): for peri care after BBM     Functional mobility during ADLs: Moderate assistance;+2 for physical assistance;+2 for safety/equipment(stedy) General ADL Comments: pt extubated again this date, progressed to further functional transfers and BADL engagement     Vision Baseline Vision/History:  No visual deficits     Perception     Praxis      Cognition Arousal/Alertness: Awake/alert Behavior During Therapy: Impulsive;Flat  affect Overall Cognitive Status: Impaired/Different from baseline Area of Impairment: Memory;Orientation;Attention;Following commands;Problem solving                 Orientation Level: Disoriented to;Situation;Time Current Attention Level: Sustained Memory: Decreased short-term memory Following Commands: Follows one step commands with increased time;Follows one step commands inconsistently Safety/Judgement: Decreased awareness of safety;Decreased awareness of deficits   Problem Solving: Slow processing;Requires verbal cues;Requires tactile cues;Difficulty sequencing General Comments: Pt required multiple verbal cues and hints to know month, day. Pt attempts to move without assist, pt unable and is high fall risk.        Exercises General Exercises - Lower Extremity Hip Flexion/Marching: AROM;Both;5 reps;Standing   Shoulder Instructions       General Comments + orthostasis, from SBP 140 pre-transfer to 110 after post-transfer. RN notified.    Pertinent Vitals/ Pain       Pain Assessment: No/denies pain Faces Pain Scale: No hurt Pain Intervention(s): Limited activity within patient's tolerance;Monitored during session  Home Living                                          Prior Functioning/Environment              Frequency  Min 2X/week        Progress Toward Goals  OT Goals(current goals can now be found in the care plan section)  Progress towards OT goals: Progressing toward goals  Acute Rehab OT Goals Patient Stated Goal: use toilet OT Goal Formulation: With patient Time For Goal Achievement: 04/09/19 Potential to Achieve Goals: Good  Plan Discharge plan needs to be updated    Co-evaluation    PT/OT/SLP Co-Evaluation/Treatment: Yes Reason for Co-Treatment: For patient/therapist safety;To address functional/ADL transfers PT goals addressed during session: Mobility/safety with mobility;Balance;Strengthening/ROM OT goals addressed  during session: ADL's and self-care;Strengthening/ROM      AM-PAC OT "6 Clicks" Daily Activity     Outcome Measure   Help from another person eating meals?: Total(coretrak) Help from another person taking care of personal grooming?: A Little Help from another person toileting, which includes using toliet, bedpan, or urinal?: A Lot Help from another person bathing (including washing, rinsing, drying)?: A Lot Help from another person to put on and taking off regular upper body clothing?: A Lot Help from another person to put on and taking off regular lower body clothing?: A Lot 6 Click Score: 12    End of Session Equipment Utilized During Treatment: Gait belt;Other (comment)(stedy)  OT Visit Diagnosis: Other abnormalities of gait and mobility (R26.89);Muscle weakness (generalized) (M62.81);Other symptoms and signs involving cognitive function;Hemiplegia and hemiparesis Hemiplegia - Right/Left: Left Hemiplegia - caused by: Cerebral infarction   Activity Tolerance Patient tolerated treatment well   Patient Left in chair;with call bell/phone within reach;with chair alarm set   Nurse Communication Mobility status;Need for lift equipment        Time: 1533-1616 OT Time Calculation (min): 43 min  Charges: OT General Charges $OT Visit: 1 Visit OT Treatments $Neuromuscular Re-education: 8-22 mins  Zenovia Jarred, MSOT, OTR/L Acute Rehabilitation Services [facility] Office Number: [phone]  Zenovia Jarred 04/01/2019, 5:38 PM

## 2019-04-01 NOTE — Progress Notes (Signed)
eLink Physician-Brief Progress Note Patient Name: Jonathan Berger DOB: 10/23/1931 MRN: [id number]   Date of Service  04/01/2019  HPI/Events of Note  Pt with multiple loose stools. RN requesting Flexiseal order.  eICU Interventions  Flexiseal ordered.        Thomasene Lot [name] 04/01/2019, 9:00 PM

## 2019-04-02 ENCOUNTER — Inpatient Hospital Stay (HOSPITAL_COMMUNITY): Payer: Medicare Other

## 2019-04-02 LAB — GLUCOSE, CAPILLARY: Glucose-Capillary: 103 mg/dL — ABNORMAL HIGH (ref 70–99)

## 2019-04-02 LAB — CBC
HCT: 35.3 % — ABNORMAL LOW (ref 39.0–52.0)
Hemoglobin: 11.2 g/dL — ABNORMAL LOW (ref 13.0–17.0)
MCH: 32.7 pg (ref 26.0–34.0)
MCHC: 31.7 g/dL (ref 30.0–36.0)
MCV: 102.9 fL — ABNORMAL HIGH (ref 80.0–100.0)
Platelets: 181 10*3/uL (ref 150–400)
RBC: 3.43 MIL/uL — ABNORMAL LOW (ref 4.22–5.81)
RDW: 14.3 % (ref 11.5–15.5)
WBC: 12.2 10*3/uL — ABNORMAL HIGH (ref 4.0–10.5)
nRBC: 0 % (ref 0.0–0.2)

## 2019-04-02 LAB — BASIC METABOLIC PANEL
Anion gap: 8 (ref 5–15)
BUN: 29 mg/dL — ABNORMAL HIGH (ref 8–23)
CO2: 21 mmol/L — ABNORMAL LOW (ref 22–32)
Calcium: 8.5 mg/dL — ABNORMAL LOW (ref 8.9–10.3)
Chloride: 119 mmol/L — ABNORMAL HIGH (ref 98–111)
Creatinine, Ser: 1.04 mg/dL (ref 0.61–1.24)
GFR calc Af Amer: >60 mL/min (ref >60)
GFR calc non Af Amer: >60 mL/min (ref >60)
Glucose, Bld: 164 mg/dL — ABNORMAL HIGH (ref 70–99)
Potassium: 3 mmol/L — ABNORMAL LOW (ref 3.5–5.1)
Sodium: 148 mmol/L — ABNORMAL HIGH (ref 135–145)

## 2019-04-02 LAB — PHOSPHORUS: Phosphorus: 1.7 mg/dL — ABNORMAL LOW (ref 2.5–4.6)

## 2019-04-02 LAB — MAGNESIUM: Magnesium: 2.1 mg/dL (ref 1.7–2.4)

## 2019-04-02 MED ORDER — FOLIC ACID 1 MG PO TABS
1.0000 mg | ORAL_TABLET | Freq: Every day | ORAL | Status: DC
  Administered 2019-04-03 – 2019-04-17 (×15): 1 mg
  Filled 2019-04-02 (×16): qty 1

## 2019-04-02 MED ORDER — POTASSIUM PHOSPHATES 15 MMOLE/5ML IV SOLN
30.0000 mmol | Freq: Once | INTRAVENOUS | Status: AC
  Administered 2019-04-02: 30 mmol via INTRAVENOUS
  Filled 2019-04-02: qty 10

## 2019-04-02 MED ORDER — THIAMINE HCL 100 MG/ML IJ SOLN: 100.0000 mg | Freq: Every day | INTRAMUSCULAR | Status: DC

## 2019-04-02 MED ORDER — WHITE PETROLATUM EX OINT
TOPICAL_OINTMENT | CUTANEOUS | Status: AC
  Filled 2019-04-02: qty 28.35

## 2019-04-02 MED ORDER — K PHOS MONO-SOD PHOS DI & MONO 155-852-130 MG PO TABS
500.0000 mg | ORAL_TABLET | ORAL | Status: AC
  Administered 2019-04-02 (×2): 500 mg
  Filled 2019-04-02 (×2): qty 2

## 2019-04-02 MED ORDER — FOLIC ACID 5 MG/ML IJ SOLN
1.0000 mg | Freq: Every day | INTRAMUSCULAR | Status: DC
  Administered 2019-04-02: 11:00:00 1 mg via INTRAVENOUS
  Filled 2019-04-02: qty 0.2

## 2019-04-02 MED ORDER — THIAMINE HCL 100 MG PO TABS
100.0000 mg | ORAL_TABLET | Freq: Every day | ORAL | Status: DC
  Administered 2019-04-02 – 2019-04-17 (×16): 100 mg
  Filled 2019-04-02 (×17): qty 1

## 2019-04-02 NOTE — Progress Notes (Signed)
PULMONARY / CRITICAL CARE MEDICINE   NAME:  Jonathan Berger, MRN:  [id number], DOB:  July 13, 1931, LOS: 22 ADMISSION DATE:  04/03/2019, CONSULTATION DATE: 03/27/2019 REFERRING MD: Interventional radiology neurology, CHIEF COMPLAINT: Vent dependent respiratory failure secondary to procedure  BRIEF HISTORY:    86 year old presenting 2/7 with slurred speech and dysphagia found to have acute right middle cerebral artery stroke s/p tPA and EVR who returns to ICU on mechanical ventilation overnight.   SIGNIFICANT PAST MEDICAL HISTORY   Hypertension Dementia SIGNIFICANT EVENTS:  03/18/2019 right MCA stroke 03/30/2019 required reintubation for respiratory insufficiency  STUDIES:   2/7 CTH >>  Hyperdense Right MCA suspicious for emergent large vessel occlusion.  No acute intracranial hemorrhage. 2/7 CTA head/ neck>> Positive for Right MCA Emergent Large Vessel Occlusion affecting the mid and distal M1 segment. Critical Stenosis cervical Right ICA beginning at the origin, throughout the proximal 3.5 cm of the vessel. There are tandem RADIOGRAPHIC STRING SIGN STENOSES. Additional severe atherosclerosis and multifocal high-grade arterial stenoses elsewhere: - Left ICA distal bulb RADIOGRAPHIC STRING SIGN STENOSIS. - Severe bilateral Vertebral Artery origin stenoses. - proximal Left Subclavian Artery moderate stenosis due to plaque and tortuosity. 2/8 TTE >>  Left ventricular ejection fraction, by estimation, is 45%. The left ventricle has mildly decreased function. The left ventrical demonstrates regional wall motion abnormalities (see scoring diagram/findings for description). Akinesis of the mid to apical anteroseptal and inferoseptal walls. Left ventricular diastolic parameters are consistent with Grade I diastolic dysfunction (impaired relaxation). 2. Right ventricular systolic function is mildly reduced. 2/8 MRI Brain >> 3.1 cm acute cortical/subcortical infarct within the anterolateral right temporal lobe.  Numerous additional small scattered acute cortical and subcortical infarct within the right cerebral hemisphere MCA vascular territory.    CULTURES:  2/7 SARS2/ Flu A/B >> neg 2/8 urine > negative 2/8 resp > normal flora 04/01/2019 sputum>> ANTIBIOTICS:  2/7 cefazolin pre op 2/9 unasyn >> off 03/30/2019 cefepime>> 03/30/2019 vancomycin>>  LINES/TUBES:  04/11/2019 endotracheal tube >> 211 with reintubation on 03/30/2019>> 04/01/2019 left radial A-line >>2/9 04/01/2019 right femoral sheath >>2/9 03/30/2019 endotracheal tube placement>> 03/31/2018  CONSULTANTS:  04/01/2019 pulmonary critical care  03/30/2019: Pulmonary critical care consultation  SUBJECTIVE/INTERVAL:  Status post extubation 04/01/2019.  Agitated and uncooperative.  CONSTITUTIONAL: BP 133/80   Pulse 75   Temp 97.9 F (36.6 C) (Axillary)   Resp 19   Ht 5\' 11"  (1.803 m)   Wt 84.7 kg   SpO2 97%   BMI 26.04 kg/m  Vent Mode: CPAP;PSV FiO2 (%):  [2 %-40 %] 2 % PEEP:  [5 cmH20] 5 cmH20 Pressure Support:  [5 cmH20] 5 cmH20   PHYSICAL EXAM:  General: Elderly male who is somewhat agitated requiring restraints and mittens HEENT: Edentulous, dry oral mucosa, no JVD or lymphadenopathy is appreciated Neuro: Agitated but able to reorientate and follows commands speech is extremely difficult to understand but is understandable CV: Heart sounds are distant PULM: Diminished in the bases  GI: soft, bsx4 active  GU: Foley in place which she is taken apart Extremities: warm/dry,  edema  Skin: no rashes or lesions  ASSESSMENT AND PLAN    Acute hypoxemic respiratory failure requiring intubation mechanical ventilation likely secondary to worsening altered mental status in the setting of recent stroke. Required re-intubation 03/30/19 - initially unclear weather had stroke conversion / new insult such as seizures; however, repeat CT and EEG negative. Unclear of this  recent change in mental status possible conversion of stroke versus seizure however no significant seizure-like activity noted by nursing staff. Status post extubation 04/01/2019 Agitation continues to be an issue Will start on thiamine folic acid empirically May need to use Precedex for him to tolerate procedures Remain in the intensive care unit for 24 more hours until he becomes more manageable This may be a combined component of his dementia If reintubated will most likely need tracheostomy in the future  Aspiration PNA. Continue cefepime and vancomycin Monitor cultures Chest x-rays  Possible GI bleeding. Continue proton pump inhibitor  R MCA infarct s/p tPA and IR with L ICA stent placement. Per neurology  Chronic HFrEF SR with PACs Beta-blockers when able  Generalized deconditioning.  In the setting advanced age. We will need rehab at some point.  Dysphagia. Core track is able  Constipation. Currently resolved with request for Flexi-Seal in place  Mild hypernatremia. Recent Labs  Lab 03/31/19 0000 04/01/19 0423 04/02/19 0250  NA 153* 149* 148*     Free water as tolerated      Jonathan Berger ACNP Acute Care Nurse Practitioner [facility] Pulmonary/Critical Care Please consult Amion 04/02/2019, 9:07 AM

## 2019-04-02 NOTE — Progress Notes (Signed)
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  Extubated 2/11, reintubated 2/15-2/17.    PT Comments    Pt with progressed tolerance this session, engaging in transfer training and repeated sit to stands in stedy for LE strengthening and balance training. Pt required less physical assist for transfers this session as well. Pt limited by incontinence of stool x2, requiring bed linen changes x2, and tacyhcardia and afib rhythm. PT to continue to follow acutely.    Follow Up Recommendations  CIR     Equipment Recommendations  Other (comment)(TBA)    Recommendations for Other Services       Precautions / Restrictions Precautions Precautions: Fall Restrictions Weight Bearing Restrictions: No    Mobility  Bed Mobility Overal bed mobility: Needs Assistance Bed Mobility: Rolling;Sidelying to Sit;Sit to Supine Rolling: Mod assist;+2 for safety/equipment Sidelying to sit: Min assist;HOB elevated Supine to sit: Mod assist;+2 for physical assistance;+2 for safety/equipment;HOB elevated     General bed mobility comments: Mod assist for rolling bilaterally x2 for pericare, pt incontinent of stool x2 during session. Min-mod assist for moving to and from EOB for trunk and LE management, positioning in bed with use of bed pad.  Transfers Overall transfer level: Needs assistance Equipment used: Ambulation equipment used Transfers: Sit to/from Stand Sit to Stand: Mod assist;From elevated surface;Min assist         General transfer comment: mod assist for power up, steadying, hip extension to neutral. Pt transitioning to min assist with repeated practice. Sit to stand x1 from EOB, x10 from stedy as  intervention.  Ambulation/Gait             General Gait Details: unable this session   Stairs             Wheelchair Mobility    Modified Rankin (Stroke Patients Only) Modified Rankin (Stroke Patients Only) Pre-Morbid Rankin Score: No symptoms Modified Rankin: Moderately severe disability     Balance Overall balance assessment: Needs assistance Sitting-balance support: Feet supported;Bilateral upper extremity supported Sitting balance-Leahy Scale: Fair     Standing balance support: Bilateral upper extremity supported Standing balance-Leahy Scale: Poor Standing balance comment: reliant on external support                            Cognition Arousal/Alertness: Awake/alert Behavior During Therapy: Impulsive;Flat affect Overall Cognitive Status: Impaired/Different from baseline Area of Impairment: Memory;Orientation;Attention;Following commands;Problem solving                 Orientation Level: Disoriented to;Situation;Time Current Attention Level: Sustained Memory: Decreased short-term memory   Safety/Judgement: Decreased awareness of safety;Decreased awareness of deficits   Problem Solving: Slow processing;Requires verbal cues;Requires tactile cues;Difficulty sequencing        Exercises      General Comments General comments (skin integrity, edema, etc.): HRmax 161 bpm in afib rhythm during standing pericare      Pertinent Vitals/Pain Pain Assessment: Faces Faces Pain Scale: Hurts little more Pain Location: perineal area, due to frequent stools Pain Descriptors / Indicators: Grimacing;Moaning Pain Intervention(s): Limited activity within patient's tolerance;Monitored during session;Repositioned    Home Living  Prior Function            PT Goals (current goals can now be found in the care plan section) Acute Rehab PT Goals PT Goal Formulation: With family Time For Goal Achievement:  04/07/19 Potential to Achieve Goals: Fair Progress towards PT goals: Progressing toward goals    Frequency    Min 4X/week      PT Plan Current plan remains appropriate    Co-evaluation              AM-PAC PT "6 Clicks" Mobility   Outcome Measure  Help needed turning from your back to your side while in a flat bed without using bedrails?: A Lot Help needed moving from lying on your back to sitting on the side of a flat bed without using bedrails?: A Lot Help needed moving to and from a bed to a chair (including a wheelchair)?: A Lot Help needed standing up from a chair using your arms (e.g., wheelchair or bedside chair)?: A Little Help needed to walk in hospital room?: Total Help needed climbing 3-5 steps with a railing? : Total 6 Click Score: 11    End of Session Equipment Utilized During Treatment: Gait belt Activity Tolerance: Patient limited by fatigue;Patient limited by pain Patient left: with call bell/phone within reach;in bed;with bed alarm set;with restraints reapplied Nurse Communication: Mobility status PT Visit Diagnosis: Other abnormalities of gait and mobility (R26.89);Muscle weakness (generalized) (M62.81);Other symptoms and signs involving the nervous system (R29.898)     Time: 7673-4193 PT Time Calculation (min) (ACUTE ONLY): 43 min  Charges:  $Therapeutic Exercise: 8-22 mins $Therapeutic Activity: 23-37 mins                    Nivan Melendrez E, PT Acute Rehabilitation Services Pager [phone]  Office [phone]    Kionna Brier D Despina Hidden 04/02/2019, 5:49 PM

## 2019-04-02 NOTE — Progress Notes (Signed)
STROKE TEAM PROGRESS NOTE   INTERVAL HISTORY .  Patient was extubated yesterday and has done well and is breathing without difficulties.  He is awake interactive  arousable and follows commands well and moves all 4 extremities purposefully against gravity.  Vitals:   04/02/19 0600 04/02/19 0700 04/02/19 0800 04/02/19 1000  BP: 133/80 101/77 126/89 140/82  Pulse: 75 (!) 41 84 (!) 57  Resp: 19 (!) 23 18 15   Temp:      TempSrc:      SpO2: 97% 96% 95% 92%  Weight:      Height:       CBC:  Recent Labs  Lab 04/01/19 0423 04/02/19 0250  WBC 8.9 12.2*  NEUTROABS 7.1  --   HGB 10.7* 11.2*  HCT 33.9* 35.3*  MCV 103.0* 102.9*  PLT 181 272   Basic Metabolic Panel:  Recent Labs  Lab 04/01/19 0423 04/02/19 0250  NA 149* 148*  K 3.6 3.0*  CL 123* 119*  CO2 21* 21*  GLUCOSE 183* 164*  BUN 34* 29*  CREATININE 1.22 1.04  CALCIUM 8.6* 8.5*  MG 2.2 2.1  PHOS 2.1* 1.7*   Lipid Panel:     Component Value Date/Time   CHOL 154 03/23/2019 0303   TRIG 154 (H) 03/31/2019 0000   HDL 36 (L) 03/23/2019 0303   CHOLHDL 4.3 03/23/2019 0303   VLDL 38 03/23/2019 0303   LDLCALC 80 03/23/2019 0303   HgbA1c:  Lab Results  Component Value Date   HGBA1C 5.9 (H) 03/23/2019   IMAGING past 48 hours DG Chest Port 1 View  Result Date: 04/02/2019 CLINICAL DATA:  86 year old who presented with a stroke on 04-14-19 for which thrombolysis of the RIGHT middle cerebral artery was performed. Extubation. EXAM: PORTABLE CHEST 1 VIEW COMPARISON:  04/01/2019 and earlier. FINDINGS: Interval extubation since yesterday. Persistent patchy airspace opacities in the RIGHT perihilar location, unchanged. Focal consolidation in the RIGHT upper lobe, unchanged. Mildly prominent bronchovascular markings diffusely and mild central peribronchial thickening, unchanged. No new pulmonary parenchymal abnormalities. No visible pleural effusions. Feeding tube courses below the diaphragm into the stomach though its tip is not  included on the image. IMPRESSION: 1. Stable pneumonia involving the RIGHT upper lobe and the RIGHT perihilar location. 2. No new abnormalities. Electronically Signed   By: Evangeline Dakin M.D.   On: 04/02/2019 08:45   DG Chest Port 1 View  Result Date: 04/01/2019 CLINICAL DATA:  Respiratory failure EXAM: PORTABLE CHEST 1 VIEW COMPARISON:  Chest radiograph from the prior day. FINDINGS: An endotracheal tube terminates in the midthoracic trachea. Two enteric tubes enter the stomach, 1 terminates below the field of view and the other terminates in the stomach. The heart remains enlarged. There is unchanged mild left basilar atelectasis/airspace disease. Right basilar atelectasis/airspace disease has decreased. There is no pleural effusion or pneumothorax. IMPRESSION: 1. Stable mild left basilar atelectasis/airspace disease. 2. Right basilar atelectasis/airspace disease has decreased. Electronically Signed   By: Zerita Boers M.D.   On: 04/01/2019 08:24   EEG adult  Result Date: 03/31/2019 Lora Havens, MD     03/31/2019  4:22 PM Patient Name: Jonathan Berger MRN: [id number] Epilepsy Attending: Lora Havens Referring Physician/Provider: Merlene Laughter, NP Date: 03/30/2019 Duration: 22.38 mins Patient history: 86 y.o. male with history of HTN presenting with slurred speech and L facial droop, diagnosed with right temporal lobe infarct. EEG to evaluate for seizure. Level of alertness: lethargic AEDs during EEG study: Versed Technical aspects: This EEG study  was done with scalp electrodes positioned according to the 10-20 International system of electrode placement. Electrical activity was acquired at a sampling rate of 500Hz  and reviewed with a high frequency filter of 70Hz  and a low frequency filter of 1Hz . EEG data were recorded continuously and digitally stored. DESCRIPTION: EEG showed continuous generalized 3-6Hz  theta-delta slowing. EEG was reactive to tactile stimuli. Hyperventilation and photic  stimulation were not performed. ABNORMALITY - Continuous slow, generalized IMPRESSION: This study is suggestive of moderate to severe diffuse encephalopathy, non specific to etiology. No seizures or epileptiform discharges were seen throughout the recording. Priyanka   Cerebral Angiogram 04-07-2019 S/P bilateral common carotid arteriograms followed by complete revascularization of RT MCA pre occlusive thrombus following balloon angioplasty with stent placement at prox RT ICA for severe pre occlusive stenosis ,and balloon angioplasty of heavily calcified plaque in proc RT ICAA with a patency  Of approx 80 % .  With TICI 3 revascularization   PHYSICAL EXAM   Temp:  [97.8 F (36.6 C)-98.2 F (36.8 C)] 97.9 F (36.6 C) (02/18 0400) Pulse Rate:  [27-153] 57 (02/18 1000) Resp:  [13-26] 15 (02/18 1000) BP: (93-188)/(58-175) 140/82 (02/18 1000) SpO2:  [92 %-100 %] 92 % (02/18 1000) Weight:  [84.7 kg] 84.7 kg (02/18 0500)  General -  Frail elderly caucasian male. Sedated and intubated  Ophthalmologic - fundi not visualized due to noncooperation.  Cardiovascular - irregular heart beats    Respiratory -  t  Neuro - sedated intubated.lethargic but eyes open to stimulation, follows commands quite well. ,  . Eyes in mid position, PERRL, blinking to visual threat bilaterally. Facial symmetric. BUE and BLE4/5 symmetric movement to command i..no focal weakness   ASSESSMENT/PLAN Jonathan Berger is a 86 y.o. male with history of HTN presenting with slurred speech and L facial droop. Received tPA 2019/04/07 at 1324. Taken for IR for distal R M1 thrombus.   Respiratory failure RLL aspiration pneumonia  intubate for IR -> Extubated 2/11   Copious secretion and SOB post extubation but stable O2 sat  CXR RLL infiltrates -> Worsening opacification   On unasyn 2/9>>2/14  Leukocytosis WBC 11.4->9.1->7.0->7.9->9.6->9.4   Temp  101.1->99.8->afebrile->100.3->afebrile->100.0->100.6   Sputum  Cx nml flora  Developed Tachypnea / Tachycardia / Low O2 sats / Fever 2/13  CXR 2/14 Changes of multifocal infiltrate  Cefepime 2/14>>  Vanc 2/14>>  Supplemental O2   Lactic acid normal  BCx no growth < 24h TRH onboard -> CCM consult ->reintubated 03/30/19 Stroke:   R MCA scattered infarcts s/p tPA and IR w/ R ICA stent placement, infarct secondary to large vessel disease source  CT head hyperdense R MCA.     CTA head & neck R MCA LVO mid and distal segment. Cervical R ICA critical stenosis w/ random string sign stenoses. L ICA distal bulb string sign, severe B VA origin stenoses   Cerebral angio TICI 3 revascularization R MCA preocclusive thrombus w/ angioplasty and stent at proximal R ICA and angioplasty of 80% plaque in L ICA.   MRI  R temporal lobe cortical/ subcortical infarct w/ numerous small scattered R brain infarcts. Proximal R M2 MCA w/ loss of signal likely residual thrombus.    2D Echo EF 45%, no SOE seen  Carotid doppler -  left ICA 60-79%  stenosis.  LDL 80  HgbA1c 5.9  P2Y12 = 8  lovenox 40 for VTE prophylaxis  No antithrombotic prior to admission, now on Brilinta (ticagrelor) 90 mg bid and ASA  81mg  daily.  Continue on discharge  Therapy recommendations:  CIR  Disposition:  pending   New HFrEF, ? D/t critical illness SR w/ PACs  2D Echo EF 45%, no SOE seen  Treated w/ lasix  On metoprolol 25mg  bid  Repeat 2D as OP  On ASA and brilinta  Troponins - 27-30-33   Cardiology reconsulted  PAF  ECG - Afib with RVR rate 157; 2nd EKG sinus arrhythmia w/ tachycardia. 3rd EKG NSR w/ PVCs  EKGs reveiwed by cardiology - multifocal atrial tachycardia with intermittent Afib  On metoprolol 50 bid  D dimer - 4.72 (H)      Cardiology recommend plavix and eliquis once he is medically stable and able to take po  Carotid stenosis  CTA neck - bilateral ICA bulb string sign  S/p right ICA stenting  S/p left ICA angioplasty  CUS - left ICA   60-79%  stenosis.  Consider further left ICA intervention in the near future - Dr. aware  Hx of Hypertension Hypotension d/t sedation, resolved  Home meds:  Valsartan-HCTZ 160-12.5, amlodipine 5  Off levophed gtt  BP stable . BP goal 110-180  . Long-term BP goal normotensive  Hyperlipidemia  Home meds:  No statin  LDL 80, goal < 70  On lipitor 40   Continue statin at discharge  Delirium / Agitation Dementia, baseline  On namenda and aricept PTA  On Seroquel 50 at bedtime  Treated w/ IV atival 0.5 q 4h since 2/13. Stopped 2/15 -> likely a contributing factor for aspiration pneumonia  In 4-point restraints per PT   Dysphagia . Secondary to stroke . NPO . TF via Cortrak . TF changed to bolus feeds.   3/13 Speech on board  Other Stroke Risk Factors  Advanced age  Former Cigarette smoker, quit 45 yrs ago  ETOH use, will advise to drink no more than 2 drink(s) a day   Other Active Problems  Urinary retention, foley placed  Thrombocytopenia - platelet 159->130->clump->120->142->156->193   Hypokalemia 4.6-2.9-2.9 - supplement - 2.8 ->supplement ->2.9->supplement ->3.7   Hypophosphatemia 2.1-1.3-1.6 - supplement->1.8   Anemia - Hb - 11.2->12.0->12.7  CRE 1.03->1.22->1.27    Hospital day # 11  Patient seems to be doing much better and has tolerated extubation well.  Recommend speech therapy to do swallow eval.  He also has A. fib but there was concern for GI bleed hence we will hold off on IV heparin due to declining hematocrit .  May consider transfer to the floor in the next 1 to 2 days if stable.  Discussed with Dr. 3/15.  Continue aspirin and Brilinta but will add heparin soon as hematocrit has been relatively stable. This patient is critically ill and at significant risk of neurological worsening, death and care requires constant monitoring of vital signs, hemodynamics,respiratory and cardiac monitoring, extensive review of multiple databases,  frequent neurological assessment, discussion with family, other specialists and medical decision making of high complexity.I have made any additions or clarifications directly to the above note.This critical care time does not reflect procedure time, or teaching time or supervisory time of PA/NP/Med Resident etc but could involve care discussion time.  I spent 30 minutes of neurocritical care time  in the care of  this patient.   Marland Kitchen, MD Stroke Neurology 04/02/2019 1:47 PM       To contact Stroke Continuity provider, please refer to Delia Heady. After hours, contact General Neurology

## 2019-04-02 NOTE — Evaluation (Signed)
Clinical/Bedside Swallow Evaluation Patient Details  Name: Jonathan Berger MRN: [id number] Date of Birth: 1931-07-09  Today's Date: 04/02/2019 Time: SLP Start Time (ACUTE ONLY): 0850 SLP Stop Time (ACUTE ONLY): 0905 SLP Time Calculation (min) (ACUTE ONLY): 15 min  Past Medical History:  Past Medical History:  Diagnosis Date  . Anemia   . Chronic back pain    herniated disc  . Hepatitis    50+yrs ago  . Hypertension    takes Amlodipine/Valsartan/HCTZ daily  . Personal history of kidney stones    Past Surgical History:  Past Surgical History:  Procedure Laterality Date  . BACK SURGERY  1977/006  . COLONOSCOPY    . CYSTOSCOPY    . ESOPHAGOGASTRODUODENOSCOPY    . IR ANGIO INTRA EXTRACRAN SEL COM CAROTID INNOMINATE UNI L MOD SED  03/20/2019  . IR ANGIO VERTEBRAL SEL SUBCLAVIAN INNOMINATE UNI L MOD SED  04/09/2019  . IR CT HEAD LTD  03/26/2019  . IR ENDOVASC INTRACRANIAL INF OTHER THAN THROMBO ART INC DIAG ANGIO  04/07/2019  . IR INTRAVSC STENT CERV CAROTID W/O EMB-PROT MOD SED INC ANGIO  03/21/2019  . LITHOTRIPSY    . LUMBAR LAMINECTOMY  01/29/2011   Procedure: MICRODISCECTOMY LUMBAR LAMINECTOMY;  Surgeon: Marybelle Killings;  Location: [facility];  Service: Orthopedics;  Laterality: Right;  Right L4-5 Microdiscectomy  . RADIOLOGY WITH ANESTHESIA N/A 03/30/2019   Procedure: RADIOLOGY WITH ANESTHESIA;  Surgeon: Luanne Bras, MD;  Location: [facility];  Service: Radiology;  Laterality: N/A;   HPI:  Pt is an 86 year old male presenting 2/7 with slurred speech and dysphagia, found to have acute R MCA stroke s/p tPA and EVR R MCA and ICA with stent. ETT 2/7-2/11. PMH: HTN. Intubated 2/7-2/11.  Assessment / Plan / Recommendation Clinical Impression   Pt encountered upright in bed. Pt's oral mech was [facility] and his voice remained clear t/o session. He was observed with ice chips, thin liquids and purees. He was noted to have multiple swallows with all consistencies, and immediate coughing followed each trial  of thin liquids. He was also noted to have an effortful swallow with some facial grimacing. Immediate inhalation post swallow with increased WOB was concerning for impaired coordination of breathing and swallowing, which is a risk for aspiration. Considering his altered mentation, s/sx of dysphagia/aspiration and recent extubation, recommend NPO with ice chips sparingly after oral care. As his mentation improves, will consider an instrumental swallow study to further assess.  SLP Visit Diagnosis: Dysphagia, oropharyngeal phase (R13.12)    Aspiration Risk  Mild aspiration risk;Moderate aspiration risk    Diet Recommendation NPO;Ice chips PRN after oral care   Medication Administration: Via alternative means    Other  Recommendations Oral Care Recommendations: Oral care prior to ice chip/H20 Other Recommendations: Have oral suction available   Follow up Recommendations Inpatient Rehab      Frequency and Duration min 2x/week  2 weeks       Prognosis Prognosis for Safe Diet Advancement: Good Barriers to Reach Goals: Cognitive deficits      Swallow Study   General Date of Onset: 03/30/19 HPI: Pt is an 86 year old male presenting 2/7 with slurred speech and dysphagia, found to have acute R MCA stroke s/p tPA and EVR R MCA and ICA with stent. ETT 2/7-2/11. PMH: HTN. Intubated 2/7-2/11. Type of Study: Bedside Swallow Evaluation Previous Swallow Assessment: see HPI Diet Prior to this Study: NPO Temperature Spikes Noted: No Respiratory Status: Room air History of Recent Intubation: Yes Length of Intubations (  days): 3 days Date extubated: 03/26/19 Behavior/Cognition: Alert;Cooperative;Confused;Requires cueing Oral Cavity Assessment: Within Functional Limits Oral Care Completed by SLP: No Oral Cavity - Dentition: Edentulous Vision: Functional for self-feeding Self-Feeding Abilities: Needs assist;Able to feed self Patient Positioning: Upright in bed Baseline Vocal Quality:  Normal Volitional Swallow: Able to elicit    Oral/Motor/Sensory Function Overall Oral Motor/Sensory Function: Within functional limits   Ice Chips Ice chips: Impaired Presentation: Spoon Pharyngeal Phase Impairments: Multiple swallows;Throat Clearing - Delayed   Thin Liquid Thin Liquid: Impaired Presentation: Cup;Spoon;Straw Pharyngeal  Phase Impairments: Cough - Immediate;Cough - Delayed;Multiple swallows    Nectar Thick Nectar Thick Liquid: Not tested   Honey Thick Honey Thick Liquid: Not tested   Puree Puree: Impaired Presentation: Spoon Pharyngeal Phase Impairments: Multiple swallows   Solid     Solid: Not tested     Maudry Mayhew, Student SLP Office: [phone]  04/02/2019,10:39 AM

## 2019-04-02 NOTE — Progress Notes (Signed)
Inpatient Rehabilitation-Admissions Coordinator   Met with pt and his wife bedside. They are still interested in CIR. Will follow one more day to ensure stability and then begin insurance authorization process for possible admit.   Raechel Ache, OTR/L  Rehab Admissions Coordinator  [phone] 04/02/2019 3:00 PM

## 2019-04-03 ENCOUNTER — Inpatient Hospital Stay (HOSPITAL_COMMUNITY): Payer: Medicare Other

## 2019-04-03 DIAGNOSIS — M6281 Muscle weakness (generalized): Secondary | ICD-10-CM

## 2019-04-03 DIAGNOSIS — R1310 Dysphagia, unspecified: Secondary | ICD-10-CM

## 2019-04-03 DIAGNOSIS — Z515 Encounter for palliative care: Secondary | ICD-10-CM

## 2019-04-03 DIAGNOSIS — R410 Disorientation, unspecified: Secondary | ICD-10-CM

## 2019-04-03 DIAGNOSIS — K117 Disturbances of salivary secretion: Secondary | ICD-10-CM

## 2019-04-03 DIAGNOSIS — Z66 Do not resuscitate: Secondary | ICD-10-CM

## 2019-04-03 DIAGNOSIS — Z7189 Other specified counseling: Secondary | ICD-10-CM

## 2019-04-03 LAB — CBC WITH DIFFERENTIAL/PLATELET
Abs Immature Granulocytes: 0.07 10*3/uL (ref 0.00–0.07)
Basophils Absolute: 0 10*3/uL (ref 0.0–0.1)
Basophils Relative: 0 %
Eosinophils Absolute: 0.1 10*3/uL (ref 0.0–0.5)
Eosinophils Relative: 1 %
HCT: 36 % — ABNORMAL LOW (ref 39.0–52.0)
Hemoglobin: 11.7 g/dL — ABNORMAL LOW (ref 13.0–17.0)
Immature Granulocytes: 1 %
Lymphocytes Relative: 9 %
Lymphs Abs: 1 10*3/uL (ref 0.7–4.0)
MCH: 32.3 pg (ref 26.0–34.0)
MCHC: 32.5 g/dL (ref 30.0–36.0)
MCV: 99.4 fL (ref 80.0–100.0)
Monocytes Absolute: 0.4 10*3/uL (ref 0.1–1.0)
Monocytes Relative: 4 %
Neutro Abs: 9.9 10*3/uL — ABNORMAL HIGH (ref 1.7–7.7)
Neutrophils Relative %: 85 %
Platelets: 236 10*3/uL (ref 150–400)
RBC: 3.62 MIL/uL — ABNORMAL LOW (ref 4.22–5.81)
RDW: 13.9 % (ref 11.5–15.5)
WBC: 11.5 10*3/uL — ABNORMAL HIGH (ref 4.0–10.5)
nRBC: 0 % (ref 0.0–0.2)

## 2019-04-03 LAB — GLUCOSE, CAPILLARY
Glucose-Capillary: 105 mg/dL — ABNORMAL HIGH (ref 70–99)
Glucose-Capillary: 127 mg/dL — ABNORMAL HIGH (ref 70–99)
Glucose-Capillary: 134 mg/dL — ABNORMAL HIGH (ref 70–99)
Glucose-Capillary: 144 mg/dL — ABNORMAL HIGH (ref 70–99)
Glucose-Capillary: 147 mg/dL — ABNORMAL HIGH (ref 70–99)
Glucose-Capillary: 149 mg/dL — ABNORMAL HIGH (ref 70–99)
Glucose-Capillary: 151 mg/dL — ABNORMAL HIGH (ref 70–99)

## 2019-04-03 LAB — CULTURE, BLOOD (ROUTINE X 2)
Culture: NO GROWTH
Culture: NO GROWTH
Special Requests: ADEQUATE
Special Requests: ADEQUATE

## 2019-04-03 LAB — BASIC METABOLIC PANEL
Anion gap: 9 (ref 5–15)
BUN: 19 mg/dL (ref 8–23)
CO2: 23 mmol/L (ref 22–32)
Calcium: 8.4 mg/dL — ABNORMAL LOW (ref 8.9–10.3)
Chloride: 115 mmol/L — ABNORMAL HIGH (ref 98–111)
Creatinine, Ser: 0.97 mg/dL (ref 0.61–1.24)
GFR calc Af Amer: >60 mL/min (ref >60)
GFR calc non Af Amer: >60 mL/min (ref >60)
Glucose, Bld: 151 mg/dL — ABNORMAL HIGH (ref 70–99)
Potassium: 2.8 mmol/L — ABNORMAL LOW (ref 3.5–5.1)
Sodium: 147 mmol/L — ABNORMAL HIGH (ref 135–145)

## 2019-04-03 LAB — PHOSPHORUS: Phosphorus: 2.3 mg/dL — ABNORMAL LOW (ref 2.5–4.6)

## 2019-04-03 LAB — MAGNESIUM: Magnesium: 2.1 mg/dL (ref 1.7–2.4)

## 2019-04-03 MED ORDER — AMLODIPINE BESYLATE 5 MG PO TABS: 5.0000 mg | ORAL_TABLET | Freq: Every day | ORAL | Status: DC

## 2019-04-03 MED ORDER — LORAZEPAM 2 MG/ML IJ SOLN
0.5000 mg | Freq: Once | INTRAMUSCULAR | Status: AC
  Administered 2019-04-03: 16:00:00 0.5 mg via INTRAVENOUS
  Filled 2019-04-03: qty 1

## 2019-04-03 MED ORDER — METOPROLOL TARTRATE 25 MG PO TABS
25.0000 mg | ORAL_TABLET | Freq: Two times a day (BID) | ORAL | Status: DC
  Administered 2019-04-03 (×2): 25 mg
  Filled 2019-04-03 (×2): qty 1

## 2019-04-03 MED ORDER — BIOTENE DRY MOUTH MT LIQD: 15.0000 mL | OROMUCOSAL | Status: DC | PRN

## 2019-04-03 MED ORDER — POTASSIUM CHLORIDE 10 MEQ/100ML IV SOLN
10.0000 meq | INTRAVENOUS | Status: AC
  Administered 2019-04-03 (×3): 10 meq via INTRAVENOUS
  Filled 2019-04-03 (×3): qty 100

## 2019-04-03 MED ORDER — POTASSIUM CHLORIDE 10 MEQ/100ML IV SOLN
10.0000 meq | Freq: Once | INTRAVENOUS | Status: AC
  Administered 2019-04-03: 18:00:00 10 meq via INTRAVENOUS
  Filled 2019-04-03: qty 100

## 2019-04-03 MED ORDER — AMLODIPINE BESYLATE 5 MG PO TABS
5.0000 mg | ORAL_TABLET | Freq: Every day | ORAL | Status: DC
  Administered 2019-04-04 – 2019-04-17 (×14): 5 mg
  Filled 2019-04-03 (×15): qty 1

## 2019-04-03 NOTE — Progress Notes (Signed)
Physical Therapy Treatment Patient Details Name: Jonathan Berger MRN: [id number] DOB: 03/09/1931 Today's Date: 04/03/2019    History of Present Illness 86 year old presenting 2/7 with slurred speech and dysphagia found to have acute right middle cerebral artery stroke s/p tPA and EVR R MCA and ICA with stent on 03/16/2019.  Extubated 2/11, reintubated 2/15-2/17.    PT Comments    Pt progressing steadily towards their physical therapy goals. Requiring two person min-moderate assist for mobility. Ambulating 6 feet with a walker and close chair follow. Continues with weakness, balance impairments, decreased endurance and cognitive deficits. Remains appropriate for CIR and suspect good progress given PLOF, motivation and family support.     Follow Up Recommendations  CIR     Equipment Recommendations  Other (comment)(defer)    Recommendations for Other Services       Precautions / Restrictions Precautions Precautions: Fall;Other (comment) Precaution Comments: Cortrak, restraints Restrictions Weight Bearing Restrictions: No    Mobility  Bed Mobility Overal bed mobility: Needs Assistance Bed Mobility: Supine to Sit     Supine to sit: Mod assist;+2 for physical assistance;+2 for safety/equipment;HOB elevated     General bed mobility comments: ModA + 2 for trunk and LE management  Transfers Overall transfer level: Needs assistance Equipment used: Rolling walker (2 wheeled) Transfers: Sit to/from [facility] Sit to Stand: Min assist;+2 physical assistance Stand pivot transfers: Mod assist;+2 physical assistance       General transfer comment: Standing with minA + 2 from bed, recliner, and BSC. ModA + 2 for stand pivot transfer from recliner <> BSC, pt tending to sit pre-emptively, needing hip guidance   Ambulation/Gait Ambulation/Gait assistance: Mod assist;+2 safety/equipment Gait Distance (Feet): 6 Feet Assistive device: Rolling walker (2 wheeled) Gait  Pattern/deviations: Step-through pattern;Wide base of support;Decreased stride length;Ataxic;Trunk flexed     General Gait Details: ModA + 2 for stability, chair follow. Cues for stepping initiation, walker proximity   Stairs             Wheelchair Mobility    Modified Rankin (Stroke Patients Only) Modified Rankin (Stroke Patients Only) Pre-Morbid Rankin Score: No symptoms Modified Rankin: Moderately severe disability     Balance Overall balance assessment: Needs assistance Sitting-balance support: Feet supported;Bilateral upper extremity supported Sitting balance-Leahy Scale: Poor Sitting balance - Comments: reliant on UE support   Standing balance support: Bilateral upper extremity supported Standing balance-Leahy Scale: Poor                              Cognition Arousal/Alertness: Awake/alert Behavior During Therapy: Impulsive;Flat affect Overall Cognitive Status: Impaired/Different from baseline Area of Impairment: Memory;Orientation;Attention;Following commands;Problem solving                 Orientation Level: Disoriented to;Situation;Time Current Attention Level: Sustained Memory: Decreased short-term memory Following Commands: Follows one step commands with increased time;Follows one step commands inconsistently Safety/Judgement: Decreased awareness of safety;Decreased awareness of deficits   Problem Solving: Slow processing;Requires verbal cues;Requires tactile cues;Difficulty sequencing General Comments: Requires cues/options for determining month, following 1 step commands, needs multimodal cues for sequencing and problem solving      Exercises      General Comments        Pertinent Vitals/Pain Pain Assessment: Faces Faces Pain Scale: Hurts little more Pain Location: site of Cortrak; urinary catheter Pain Descriptors / Indicators: Grimacing;Discomfort Pain Intervention(s): Monitored during session    Home Living  Prior Function            PT Goals (current goals can now be found in the care plan section) Acute Rehab PT Goals Patient Stated Goal: pt wife would like him to progress walking Potential to Achieve Goals: Fair Progress towards PT goals: Progressing toward goals    Frequency    Min 4X/week      PT Plan Current plan remains appropriate    Co-evaluation PT/OT/SLP Co-Evaluation/Treatment: Yes Reason for Co-Treatment: Complexity of the patient's impairments (multi-system involvement);Necessary to address cognition/behavior during functional activity;For patient/therapist safety;To address functional/ADL transfers PT goals addressed during session: Mobility/safety with mobility        AM-PAC PT "6 Clicks" Mobility   Outcome Measure  Help needed turning from your back to your side while in a flat bed without using bedrails?: A Little Help needed moving from lying on your back to sitting on the side of a flat bed without using bedrails?: A Lot Help needed moving to and from a bed to a chair (including a wheelchair)?: A Lot Help needed standing up from a chair using your arms (e.g., wheelchair or bedside chair)?: A Little Help needed to walk in hospital room?: A Lot Help needed climbing 3-5 steps with a railing? : Total 6 Click Score: 13    End of Session Equipment Utilized During Treatment: Gait belt Activity Tolerance: Patient tolerated treatment well Patient left: with call bell/phone within reach;with restraints reapplied;in chair;with chair alarm set;with family/visitor present Nurse Communication: Mobility status PT Visit Diagnosis: Other abnormalities of gait and mobility (R26.89);Muscle weakness (generalized) (M62.81);Other symptoms and signs involving the nervous system (R29.898)     Time: 6269-4854 PT Time Calculation (min) (ACUTE ONLY): 34 min  Charges:  $Therapeutic Activity: 8-22 mins                       Jonathan Berger, PT, DPT Acute  Rehabilitation Services Pager [phone] Office [phone]    Jonathan Berger 04/03/2019, 2:55 PM

## 2019-04-03 NOTE — Progress Notes (Signed)
STROKE TEAM PROGRESS NOTE   INTERVAL HISTORY .  Patient is working with therapist and walking with 2 person assist . Wife at bedside.  Vitals:   04/03/19 0030 04/03/19 0451 04/03/19 0902 04/03/19 1214  BP: (!) 154/85 (!) 154/82 (!) 145/84 111/75  Pulse: 87 77 93 76  Resp: 15 (!) 22 (!) 24 (!) 22  Temp: 98.2 F (36.8 C) (!) 97.5 F (36.4 C) (!) 97.5 F (36.4 C) (!) 97.3 F (36.3 C)  TempSrc:  Oral Oral Oral  SpO2: 97% 94% 98% 100%  Weight:      Height:       CBC:  Recent Labs  Lab 04/01/19 0423 04/01/19 0423 04/02/19 0250 04/03/19 0439  WBC 8.9   < > 12.2* 11.5*  NEUTROABS 7.1  --   --  9.9*  HGB 10.7*   < > 11.2* 11.7*  HCT 33.9*   < > 35.3* 36.0*  MCV 103.0*   < > 102.9* 99.4  PLT 181   < > 181 236   < > = values in this interval not displayed.   Basic Metabolic Panel:  Recent Labs  Lab 04/02/19 0250 04/03/19 0439  NA 148* 147*  K 3.0* 2.8*  CL 119* 115*  CO2 21* 23  GLUCOSE 164* 151*  BUN 29* 19  CREATININE 1.04 0.97  CALCIUM 8.5* 8.4*  MG 2.1 2.1  PHOS 1.7* 2.3*   Lipid Panel:     Component Value Date/Time   CHOL 154 03/23/2019 0303   TRIG 154 (H) 03/31/2019 0000   HDL 36 (L) 03/23/2019 0303   CHOLHDL 4.3 03/23/2019 0303   VLDL 38 03/23/2019 0303   LDLCALC 80 03/23/2019 0303   HgbA1c:  Lab Results  Component Value Date   HGBA1C 5.9 (H) 03/23/2019   IMAGING past 48 hours DG Chest Port 1 View  Result Date: 04/03/2019 CLINICAL DATA:  Respiratory failure.  Cough. EXAM: PORTABLE CHEST 1 VIEW COMPARISON:  04/02/2019.  04/01/2019. FINDINGS: Feeding tube noted with tip below left hemidiaphragm. Stable cardiomegaly. Persistent right upper and right lower infiltrates are again noted without interim change. Left base infiltrate unchanged. No pleural effusion or pneumothorax. IMPRESSION: 1.  Feeding tube in stable position. 2.  Stable cardiomegaly. 3. Persistent right upper and right lower infiltrates. Persistent left base infiltrate. No interim change.  Electronically Signed   By: Marcello Moores  Register   On: 04/03/2019 06:52   DG Chest Port 1 View  Result Date: 04/02/2019 CLINICAL DATA:  86 year old who presented with a stroke on April 08, 2019 for which thrombolysis of the RIGHT middle cerebral artery was performed. Extubation. EXAM: PORTABLE CHEST 1 VIEW COMPARISON:  04/01/2019 and earlier. FINDINGS: Interval extubation since yesterday. Persistent patchy airspace opacities in the RIGHT perihilar location, unchanged. Focal consolidation in the RIGHT upper lobe, unchanged. Mildly prominent bronchovascular markings diffusely and mild central peribronchial thickening, unchanged. No new pulmonary parenchymal abnormalities. No visible pleural effusions. Feeding tube courses below the diaphragm into the stomach though its tip is not included on the image. IMPRESSION: 1. Stable pneumonia involving the RIGHT upper lobe and the RIGHT perihilar location. 2. No new abnormalities. Electronically Signed   By: Evangeline Dakin M.D.   On: 04/02/2019 08:45   Cerebral Angiogram 04/08/2019 S/P bilateral common carotid arteriograms followed by complete revascularization of RT MCA pre occlusive thrombus following balloon angioplasty with stent placement at prox RT ICA for severe pre occlusive stenosis ,and balloon angioplasty of heavily calcified plaque in proc RT ICAA with a patency  Of approx 80 % .  With TICI 3 revascularization   PHYSICAL EXAM   Temp:  [97.3 F (36.3 C)-98.2 F (36.8 C)] 97.3 F (36.3 C) (02/19 1214) Pulse Rate:  [34-109] 76 (02/19 1214) Resp:  [13-26] 22 (02/19 1214) BP: (111-165)/(67-98) 111/75 (02/19 1214) SpO2:  [94 %-100 %] 100 % (02/19 1214)  General -  Frail elderly caucasian male. Sedated and intubated  Ophthalmologic - fundi not visualized due to noncooperation.  Cardiovascular - irregular heart beats    Respiratory -   Clear to auscultation  Neuro - awake alert  follows commands quite well. ,  . Eyes in mid position, PERRL, blinking to  visual threat bilaterally. Facial symmetric. BUE and BLE4/5 symmetric movement to command i..no focal weakness   ASSESSMENT/PLAN [name] is a 86 y.o. male with history of HTN presenting with slurred speech and L facial droop. Received tPA 03/20/2019 at 1324. Taken for IR for distal R M1 thrombus.   Respiratory failure RLL aspiration pneumonia  intubate for IR -> Extubated 2/11   Copious secretion and SOB post extubation but stable O2 sat  CXR RLL infiltrates -> Worsening opacification   On unasyn 2/9>>2/14  Leukocytosis WBC 11.4->9.1->7.0->7.9->9.6->9.4   Temp  101.1->99.8->afebrile->100.3->afebrile->100.0->100.6   Sputum Cx nml flora  Developed Tachypnea / Tachycardia / Low O2 sats / Fever 2/13  CXR 2/14 Changes of multifocal infiltrate  Cefepime 2/14>>  Vanc 2/14>>  Supplemental O2   Lactic acid normal  BCx no growth < 24h TRH onboard -> CCM consult ->reintubated 03/30/19 Stroke:   R MCA scattered infarcts s/p tPA and IR w/ R ICA stent placement, infarct secondary to large vessel disease source  CT head hyperdense R MCA.     CTA head & neck R MCA LVO mid and distal segment. Cervical R ICA critical stenosis w/ random string sign stenoses. L ICA distal bulb string sign, severe B VA origin stenoses   Cerebral angio TICI 3 revascularization R MCA preocclusive thrombus w/ angioplasty and stent at proximal R ICA and angioplasty of 80% plaque in L ICA.   MRI  R temporal lobe cortical/ subcortical infarct w/ numerous small scattered R brain infarcts. Proximal R M2 MCA w/ loss of signal likely residual thrombus.    2D Echo EF 45%, no SOE seen  Carotid doppler -  left ICA 60-79%  stenosis.  LDL 80  HgbA1c 5.9  P2Y12 = 8  lovenox 40 for VTE prophylaxis  No antithrombotic prior to admission, now on Brilinta (ticagrelor) 90 mg bid and ASA 81mg  daily.  Continue on discharge  Therapy recommendations:  CIR  Disposition:  pending   New HFrEF, ? D/t critical  illness SR w/ PACs  2D Echo EF 45%, no SOE seen  Treated w/ lasix  On metoprolol 25mg  bid  Repeat 2D as OP  On ASA and brilinta  Troponins - 27-30-33   Cardiology reconsulted  PAF  ECG - Afib with RVR rate 157; 2nd EKG sinus arrhythmia w/ tachycardia. 3rd EKG NSR w/ PVCs  EKGs reveiwed by cardiology - multifocal atrial tachycardia with intermittent Afib  On metoprolol 50 bid  D dimer - 4.72 (H)      Cardiology recommend plavix and eliquis once he is medically stable and able to take po  Carotid stenosis  CTA neck - bilateral ICA bulb string sign  S/p right ICA stenting  S/p left ICA angioplasty  CUS - left ICA  60-79%  stenosis.  Consider further left ICA intervention  in the near future - Dr. Corliss Skains aware  Hx of Hypertension Hypotension d/t sedation, resolved  Home meds:  Valsartan-HCTZ 160-12.5, amlodipine 5  Off levophed gtt  BP stable . BP goal 110-180  . Long-term BP goal normotensive  Hyperlipidemia  Home meds:  No statin  LDL 80, goal < 70  On lipitor 40   Continue statin at discharge  Delirium / Agitation Dementia, baseline  On namenda and aricept PTA  On Seroquel 50 at bedtime  Treated w/ IV atival 0.5 q 4h since 2/13. Stopped 2/15 -> likely a contributing factor for aspiration pneumonia  In 4-point restraints per PT   Dysphagia . Secondary to stroke . NPO . TF via Cortrak . TF changed to bolus feeds.   Marland Kitchen Speech on board  Other Stroke Risk Factors  Advanced age  Former Cigarette smoker, quit 45 yrs ago  ETOH use, will advise to drink no more than 2 drink(s) a day   Other Active Problems  Urinary retention, foley placed  Thrombocytopenia - platelet 159->130->clump->120->142->156->193   Hypokalemia 4.6-2.9-2.9 - supplement - 2.8 ->supplement ->2.9->supplement ->3.7   Hypophosphatemia 2.1-1.3-1.6 - supplement->1.8   Anemia - Hb - 11.2->12.0->12.7  CRE 1.03->1.22->1.27    Hospital day # 12  Patient seems to  be doing much better and is progressing with therapies  Cardiology now think he does not have afib but MAT hence no need for anticoagulation. Continue aspirin and brillinta. Discussed with  patient, wife and Dr Sharolyn Douglas. Stroke team will sign off. Call for questions.  Delia Heady, MD Stroke Neurology 04/03/2019 2:54 PM       To contact Stroke Continuity provider, please refer to [email or web address]. After hours, contact General Neurology

## 2019-04-03 NOTE — Progress Notes (Signed)
Inpatient Rehabilitation-Admissions Coordinator   [facility] initiated insurance authorization process for possible admit for next week. Will update once there has been a determination.   Cheri Rous, OTR/L  Rehab Admissions Coordinator  [phone] 04/03/2019 3:31 PM

## 2019-04-03 NOTE — Progress Notes (Signed)
This RN texted Dr. Sharolyn Douglas about patients increased agitation. This Rn informed MD of patient not following commands, pulling at lines despite his mittens, and slouches in the bed repeatedly and that I fear patient may aspirate. Order received for 1:1 sitter as this patient has been 1:1 majority of this shift requiring constant supervision as to not lay flat, pull at lines and try to exit the bed.  Order also received for .5mg  IV ativan.

## 2019-04-03 NOTE — Progress Notes (Signed)
Pharmacy Antibiotic Note  Jonathan Berger is a 86 y.o. male admitted on 04/06/2019 now with aspiration PNA.  Pharmacy has been consulted for Vanco/Cefepime dosing.  ID: [id number] for asp PNA - afebrile, WBC 11.5. Scr WNL  Vanc 2/14 >> Cefepime 2/14 >> Unasyn 2/9>>2/14  2/7 covid / flu - negative 2/8: Trach aspirate: normal flora 2/8 MRSA PCR - negative 2/8 UCx - negative 2/8 TA - negative 2/14 BCx - Neg 2/17 TA - rare GPC, rare yeast   Plan: Vanc 1gm IV Q24H Cefepime 2gm IV Q12H If antibiotics con't past today, we have to finally check Vanco levels.    Height: 5\' 11"  (180.3 cm) Weight: 186 lb 11.7 oz (84.7 kg) IBW/kg (Calculated) : 75.3  Temp (24hrs), Avg:97.9 F (36.6 C), Min:97.5 F (36.4 C), Max:98.2 F (36.8 C)  Recent Labs  Lab 03/29/19 0425 03/29/19 1149 03/29/19 1431 03/30/19 0446 03/31/19 0000 04/01/19 0423 04/02/19 0250 04/03/19 0439  WBC   < >  --   --  9.4 9.8 8.9 12.2* 11.5*  CREATININE   < >  --   --  1.27* 1.31* 1.22 1.04 0.97  LATICACIDVEN  --  1.3 1.4  --   --   --   --   --    < > = values in this interval not displayed.    Estimated Creatinine Clearance: 58.2 mL/min (by C-G formula based on SCr of 0.97 mg/dL).    No Known Allergies  Jonathan Berger S. 04/05/19, PharmD, BCPS Clinical Staff Pharmacist [email or web address]  Jonathan Berger 04/03/2019 9:54 AM

## 2019-04-03 NOTE — Progress Notes (Signed)
This RN spoke with Dr. Sharolyn Douglas in room about tele hub's notification of patients 6bt vtach and new conversion to afib 120's.  Verbal order received for 12lead ekg.

## 2019-04-03 NOTE — Plan of Care (Signed)
Progressing

## 2019-04-03 NOTE — Progress Notes (Signed)
  Speech Language Pathology Treatment: Dysphagia  Patient Details Name: Jonathan Berger MRN: [id number] DOB: 06/25/1931 Today's Date: 04/03/2019 Time: 6314-9702 SLP Time Calculation (min) (ACUTE ONLY): 20 min  Assessment / Plan / Recommendation Clinical Impression  Pt was seen at bedside for PO trials after BSE yesterday. Upon arrival of SLP, pt was sitting st edge of bed with bilateral mitts and waist restraint in place, Pt frequently attempted to remove mitts and stand up, needing ongoing redirection throughout this session. Oral care was completed with suction. Pt assisted with suction swab. Slight bleeding noted. Pt accepted ice chips via teaspoon, and did not exhibit overt s/s aspiration. Pt was then given small sips of water via cup, and exhibited immediate cough response. Pt declined further presentations of ice chips after that. Pt was repositioned, lying in bed with restraints in place. RN informed of session results. SLP will continue to follow to assess readiness for po intake and/or instrumental study.    HPI HPI: Pt is an 86 year old male presenting 2/7 with slurred speech and dysphagia, found to have acute R MCA stroke s/p tPA and EVR R MCA and ICA with stent. ETT 2/7-2/11. PMH: HTN. Intubated 2/7-2/11.      SLP Plan  Continue with current plan of care       Recommendations  Diet recommendations: NPO Medication Administration: Via alternative means                Oral Care Recommendations: Oral care prior to ice chip/H20 Follow up Recommendations: Inpatient Rehab SLP Visit Diagnosis: Dysphagia, oropharyngeal phase (R13.12) Plan: Continue with current plan of care       GO               Jonathan Berger B. Murvin Natal, [facility], CCC-SLP Speech Language Pathologist Office: [phone] Pager: [phone]  Leigh Aurora 04/03/2019, 3:36 PM

## 2019-04-03 NOTE — Progress Notes (Signed)
Occupational Therapy Treatment Patient Details Name: Jonathan Berger MRN: [id number] DOB: [birth year] Today's Date: 04/03/2019    History of present illness 86 year old presenting 2/7 with slurred speech and dysphagia found to have acute right middle cerebral artery stroke s/p tPA and EVR R MCA and ICA with stent on 04-13-2019.  Extubated 2/11, reintubated 2/15-2/17.   OT comments  Pt progressing towards established OT goals. Pt agreeable and motivated to participate in therapy. Wife present and very supportive. Pt donning/doffing new gown with Min-Mod A. Pt performing toileting with Mod A +2 for transfer to [facility] and then Max A +2 for peri care. Pt presenting with decreased cognition impacting his functional performance and requiring simple, direct cues for sequencing of ADLs. Continue to recommend dc to CIR for intensive OT and will continue to follow acutely as admitted.   Follow Up Recommendations  CIR;Supervision/Assistance - 24 hour    Equipment Recommendations  Other (comment)(Defer to next venue)    Recommendations for Other Services      Precautions / Restrictions Precautions Precautions: Fall;Other (comment) Precaution Comments: Cortrak, restraints, mitts Restrictions Weight Bearing Restrictions: No       Mobility Bed Mobility Overal bed mobility: Needs Assistance Bed Mobility: Supine to Sit     Supine to sit: Mod assist;+2 for physical assistance;+2 for safety/equipment;HOB elevated     General bed mobility comments: ModA + 2 for trunk and LE management  Transfers Overall transfer level: Needs assistance Equipment used: Rolling walker (2 wheeled) Transfers: Sit to/from [employer] Sit to Stand: Min assist;+2 physical assistance Stand pivot transfers: Mod assist;+2 physical assistance       General transfer comment: Standing with minA + 2 from bed, recliner, and BSC. ModA + 2 for stand pivot transfer from recliner <> BSC, pt tending to sit  pre-emptively, needing hip guidance     Balance Overall balance assessment: Needs assistance Sitting-balance support: Feet supported;Bilateral upper extremity supported Sitting balance-Leahy Scale: Poor Sitting balance - Comments: reliant on UE support   Standing balance support: Bilateral upper extremity supported Standing balance-Leahy Scale: Poor                             ADL either performed or assessed with clinical judgement   ADL Overall ADL's : Needs assistance/impaired                 Upper Body Dressing : Moderate assistance;Sitting Upper Body Dressing Details (indicate cue type and reason): Min A for doffing soiled gown and then Mod A for donning new gown. Lower Body Dressing: Moderate assistance;Sit to/from stand Lower Body Dressing Details (indicate cue type and reason): Pt bending forwards to adjust socks with Min Guard A for safety. Mod A for dynamic standing balance.  Toilet Transfer: Minimal assistance;+2 for physical assistance;+2 for safety/equipment;Moderate assistance;Stand-pivot;RW;BSC Toilet Transfer Details (indicate cue type and reason): Min-Mod A for balance and Max cues for sequencing Toileting- Clothing Manipulation and Hygiene: Maximal assistance;+2 for physical assistance;Sit to/from stand Toileting - Clothing Manipulation Details (indicate cue type and reason): Requiring +2 for balance and then to complete peri care. Max A for peri care as pt with decreased awareness of hygiene needs and wipping at bowel with bare hands.     Functional mobility during ADLs: Minimal assistance;+2 for physical assistance;Rolling walker General ADL Comments: Pt presenting with decreased cogntion, balance, and safety.      Vision       Perception  Praxis      Cognition Arousal/Alertness: Awake/alert Behavior During Therapy: Impulsive;Flat affect Overall Cognitive Status: Impaired/Different from baseline Area of Impairment:  Memory;Orientation;Attention;Following commands;Problem solving;Awareness;Safety/judgement                 Orientation Level: Disoriented to;Situation;Time Current Attention Level: Sustained Memory: Decreased short-term memory Following Commands: Follows one step commands with increased time;Follows one step commands inconsistently Safety/Judgement: Decreased awareness of safety;Decreased awareness of deficits Awareness: Intellectual Problem Solving: Slow processing;Requires verbal cues;Requires tactile cues;Difficulty sequencing General Comments: Pt requiring simple direct cues. Responding and attending to his name. Decreased awareness and problem solving. Agreeable to therapy and motivated to partcipate.         Exercises     Shoulder Instructions       General Comments Wife present at begining and end of session    Pertinent Vitals/ Pain       Pain Assessment: No/denies pain Faces Pain Scale: Hurts little more Pain Location: site of Cortrak; urinary catheter Pain Descriptors / Indicators: Grimacing;Discomfort Pain Intervention(s): Monitored during session;Limited activity within patient's tolerance;Repositioned  Home Living                                          Prior Functioning/Environment              Frequency  Min 2X/week        Progress Toward Goals  OT Goals(current goals can now be found in the care plan section)  Progress towards OT goals: Progressing toward goals  Acute Rehab OT Goals Patient Stated Goal: pt wife would like him to progress walking OT Goal Formulation: With patient Time For Goal Achievement: 04/09/19 Potential to Achieve Goals: Good ADL Goals Pt Will Perform Grooming: with min assist;standing Pt Will Perform Lower Body Bathing: with min assist;sit to/from stand;sitting/lateral leans Pt Will Perform Lower Body Dressing: with min assist;sitting/lateral leans;sit to/from stand Pt Will Transfer to Toilet:  with mod assist;squat pivot transfer;bedside commode Additional ADL Goal #1: Pt will complete bed mobility at min guard level in preparation for BADL Additional ADL Goal #2: Pt will follow 75% of one step commands to successfully complete BADL Additional ADL Goal #3: Pt will accurately identify 4/5 BADL items and apply them to BADL usage  Plan Discharge plan remains appropriate    Co-evaluation    PT/OT/SLP Co-Evaluation/Treatment: Yes Reason for Co-Treatment: For patient/therapist safety;To address functional/ADL transfers PT goals addressed during session: Mobility/safety with mobility OT goals addressed during session: ADL's and self-care      AM-PAC OT "6 Clicks" Daily Activity     Outcome Measure   Help from another person eating meals?: Total Help from another person taking care of personal grooming?: A Little Help from another person toileting, which includes using toliet, bedpan, or urinal?: A Lot Help from another person bathing (including washing, rinsing, drying)?: A Lot Help from another person to put on and taking off regular upper body clothing?: A Lot Help from another person to put on and taking off regular lower body clothing?: A Lot 6 Click Score: 12    End of Session Equipment Utilized During Treatment: Gait belt;Rolling walker  OT Visit Diagnosis: Other abnormalities of gait and mobility (R26.89);Muscle weakness (generalized) (M62.81);Other symptoms and signs involving cognitive function;Hemiplegia and hemiparesis Hemiplegia - Right/Left: Left Hemiplegia - caused by: Cerebral infarction   Activity Tolerance Patient tolerated treatment well  Patient Left in chair;with call bell/phone within reach;with chair alarm set;with restraints reapplied   Nurse Communication Mobility status        Time: 1127-1205 OT Time Calculation (min): 38 min  Charges: OT General Charges $OT Visit: 1 Visit OT Treatments $Self Care/Home Management : 23-37 mins  Sheldon Sem MSOT, OTR/L Acute Rehab Pager: [phone] Office: [phone]   Theodoro Grist Corneluis [place] 04/03/2019, 3:23 PM

## 2019-04-03 NOTE — Consult Note (Signed)
Consultation Note Date: 04/03/2019   Patient Name: Jonathan Berger  DOB: Jan 07, 1932  MRN: [id number]  Age / Sex: 86 y.o., male  PCP: [name], MD Referring Physician: Alma Friendly, MD  Reason for Consultation: Establishing goals of care  HPI/Patient Profile:  89 yr oldmale with history of hypertension, who was admitted by Dr. [name] on 03/17/2019 for dense right ischemic MCA CVA, s/p TPA and VIR intervention with balloon angioplasty and stent placement at proximal R ICA. Pt developed right lower lobe aspiration pneumonitis after extubation on 03/26/2019 and was later re-intubated on 2/15 and extubated on 2/17 . Pt was also found to have new systolic CHF with EF 17%. Pt had been managed in the ICU and transferred to the floor, TRH assumed care on 04/03/19.  Clinical Assessment and Goals of Care: I have reviewed medical records including EPIC notes, labs and imaging, received report from bedside RN, assessed the patient.    I called Denyse Dago to further discuss diagnosis prognosis, GOC, EOL wishes, disposition and options.   I introduced Palliative Medicine as specialized medical care for people living with serious illness. It focuses on providing relief from the symptoms and stress of a serious illness. The goal is to improve quality of life for both the patient and the family.  [name] shared that prior to [facility] stroke he was fully independent. He was able to do everything for himself. He was a very independent man. He has been married to Tushka for over 49 years, she is his second wife. He has four children from his first marriage and three that he and [place] share. He grew up in [place] and was a member of the [employer] leading to his move to [place]. 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 One of the patients sons lives with them. He get  enjoyment out of speaking to his neighbors in addition to sitting his front porch to see the [place] on in the neighborhood. His family takes a vacation annually which he looks forward to, this year they are traveling to [place].  Patient considered himself a prayerful man. He was a member of the [employer] and is a [employer]  Regarding [place] hopes for S[place], she would like him to maintain again some degree of independence. She shared that he was able to walk today which was reassuring. We discussed his ongoing delirium which can complicate hospitalizations tremendously. [name] shared that some days he seems better than on others.   A detailed discussion was had today regarding advanced directives.    Concepts specific to code status, artifical feeding and hydration, continued IV antibiotics and rehospitalization was had.    The difference between an aggressive medical intervention path  and a palliative comfort care path for this patient at this time was had.   Values and goals of care important to patient and family were attempted to be elicited  At the present time the patient  is DNR/DNI, reviewed with [name] the potential trauma that could be inflicted upon the patient if her were to be placed through a code. She seemed to understand that he would not recover from such a situation well. She opts for no heroic measures. Regarding artificial feedings, he presently has a coretrack in place. Linda and I discussed if were unable to swallow the role of a G-tube which she seemed to understand. She feels that she needs more time to think about this.  We talked about the hope that Rhett will make a meaningful improvement but preparing for the potential that he does not. Broached the topic of hospice in preparation. At the present time [name] would want outpatient palliative care follow up.  Discussed with patient the importance of continued conversation with family and their  medical  providers regarding overall plan of care and treatment options, ensuring decisions are within the context of the patients values and GOCs.  We will continue to follow and offer support at able  Decision Maker: [name] (spouse) [phone]  SUMMARY OF RECOMMENDATIONS   DNR/DNI  Patients wife is still thinking about the concept of long term tube feeding via a g-tube  Plan to transition to CIR once identified as stable  TOC --> OP Palliative follow up  [place]:  DNR  Symptom Management: Muscular Weakness:  - PT  - OT Dysphagia:  - Speech therapy  - Coretrack in place offering supplemental nutrition Xerostomia:   - Good oral care QShift   - Biot[place] Delirium:  - Delirium precautions  - Get up during the day   - Encourage a familiar face to remain present throughout the day  - Keep blinds open and lights on during daylight hours  - Minimize the use of opioids/benzodiazepines Spiritual:   - Chaplain consult Palliative Prophylaxis:   Aspiration, Bowel Regimen, Delirium Protocol, Eye Care, Frequent Pain Assessment, Oral Care, Palliative Wound Care and Turn Reposition  Additional Recommendations (Limitations, Scope, Preferences):  Full Scope Treatment  Psycho-social/Spiritual:   Desire for further Chaplaincy support: Yes  Additional Recommendations: Caregiving  Support/Resources  Prognosis:   UTA  Discharge Planning: CIR    Primary Diagnoses: Present on Admission:  Middle cerebral artery embolism, right  Acute on chronic respiratory failure with hypoxia (HCC)  Systolic dysfunction  Dehydration  I have reviewed the medical record, interviewed the patient and family, and examined the patient. The following aspects are pertinent.  Past Medical History:  Diagnosis Date   Anemia    Chronic back pain    herniated disc   Hepatitis    50+yrs ago   Hypertension    takes Amlodipine/Valsartan/HCTZ  daily   Personal history of kidney stones    Social History   Socioeconomic History   Marital status: Married    Spouse name: Not on file   Number of children: Not on file   Years of education: Not on file   Highest education level: Not on file  Occupational History   Not on file  Tobacco Use   Smoking status: Former Smoker    Packs/day: 0.25    Years: 30.00    Pack years: 7.50    Types: Cigarettes    Quit date: 01/25/1974    Years since quitting: 45.2   Smokeless tobacco: Never Used  Substance and Sexual Activity   Alcohol use: Yes    Comment: occ beer   Drug use: No   Sexual activity: Yes  Other Topics  Concern   Not on file  Social History Narrative   Not on file   Social Determinants of Health   Financial Resource Strain:    Difficulty of Paying Living Expenses: Not on file  Food Insecurity:    Worried About [place] in the Last Year: Not on file   Ran Out of Food in the Last Year: Not on file  Transportation Needs:    Lack of Transportation (Medical): Not on file   Lack of Transportation (Non-Medical): Not on file  Physical Activity:    Days of Exercise per Week: Not on file   Minutes of Exercise per Session: Not on file  Stress:    Feeling of Stress : Not on file  Social Connections:    Frequency of Communication with Friends and Family: Not on file   Frequency of Social Gatherings with Friends and Family: Not on file   Attends Religious Services: Not on file   Active Member of Clubs or Organizations: Not on file   Attends Archivist Meetings: Not on file   Marital Status: Not on file   Family History  Problem Relation Age of Onset   Anesthesia problems Neg Hx    Hypotension Neg Hx    Malignant hyperthermia Neg Hx    Pseudochol deficiency Neg Hx    Scheduled Meds:  amLODipine  5 mg Per Tube Daily   aspirin  81 mg Per Tube Daily   atorvastatin  40 mg Per Tube q1800   chlorhexidine gluconate  (MEDLINE KIT)  15 mL Mouth Rinse BID   Chlorhexidine Gluconate Cloth  6 each Topical Daily   donepezil  5 mg Per Tube QHS   doxazosin  1 mg Per Tube Daily   enoxaparin (LOVENOX) injection  40 mg Subcutaneous Q24H   feeding supplement (OSMOLITE 1.5 CAL)  1,000 mL Per Tube Q24H   feeding supplement (PRO-STAT SUGAR FREE 64)  30 mL Per Tube TID   folic acid  1 mg Per Tube Daily   free water  350 mL Per Tube Q4H   mouth rinse  15 mL Mouth Rinse 10 times per day   metoprolol tartrate  25 mg Per Tube BID   pantoprazole sodium  40 mg Per Tube BID   thiamine  100 mg Per Tube Daily   ticagrelor  90 mg Oral BID   Or   ticagrelor  90 mg Per Tube BID   Continuous Infusions:  sodium chloride     sodium chloride Stopped (04/01/19 1509)   ceFEPime (MAXIPIME) IV 2 g (04/03/19 1000)   potassium chloride     vancomycin 1,000 mg (04/03/19 1154)   PRN Meds:.acetaminophen **OR** acetaminophen (TYLENOL) oral liquid 160 mg/5 mL **OR** acetaminophen, bisacodyl, docusate sodium, metoprolol tartrate, nitroGLYCERIN, sodium chloride flush Medications Prior to Admission:  Prior to Admission medications   Medication Sig Start Date End Date Taking? Authorizing Provider  amLODipine (NORVASC) 5 MG tablet Take 5 mg by mouth daily. 01/29/19  Yes [provider]  donepezil (ARICEPT) 5 MG tablet Take 5 mg by mouth at bedtime. 03/05/19  Yes [provider]  Ensure (ENSURE) Take 237 mLs by mouth 3 (three) times daily between meals.   Yes [provider]  memantine (NAMENDA) 5 MG tablet Take 5 mg by mouth 2 (two) times daily. 02/04/19  Yes [provider]  triamcinolone cream (KENALOG) 0.1 % Apply 1 application topically at bedtime. Apply to rash on back of right leg below knee  Yes [provider]  valsartan-hydrochlorothiazide (DIOVAN-HCT) 160-12.5 MG tablet Take 1 tablet by mouth daily. 01/29/19  Yes [provider]  amLODipine-valsartan (EXFORGE)  5-320 MG per tablet Take 1 tablet by mouth daily.    04/27/11  [provider]   No Known Allergies Review of Systems  Unable to perform ROS  Physical Exam Vitals and nursing note reviewed.  HENT:     Head: Normocephalic.     Nose:     Comments: Coretrack in place    Mouth/Throat:     Mouth: Mucous membranes are dry.  Eyes:     Pupils: Pupils are equal, round, and reactive to light.  Cardiovascular:     Rate and Rhythm: Tachycardia present. Rhythm irregular.  Musculoskeletal:     Cervical back: Normal range of motion.  Skin:    General: Skin is warm.  Neurological:     Mental Status: He is alert. He is disoriented.  Psychiatric:        Attention and Perception: He is inattentive.        Judgment: Judgment is impulsive.    Vital Signs: BP (!) 121/101 (BP Location: Left Arm)    Pulse 61    Temp (!) 97.3 F (36.3 C) (Oral)    Resp (!) 24    Ht '5\' 11"'  (1.803 m)    Wt 84.7 kg    SpO2 97%    BMI 26.04 kg/m  Pain Scale: 0-10   Pain Score: 0-No pain  SpO2: SpO2: 97 % O2 Device:SpO2: 97 % O2 Flow Rate: .O2 Flow Rate (L/min): 2 L/min  IO: Intake/output summary:   Intake/Output Summary (Last 24 hours) at 04/03/2019 1702 Last data filed at 04/02/2019 1941 Gross per 24 hour  Intake 350 ml  Output --  Net 350 ml   LBM: Last BM Date: 04/02/19 Baseline Weight: Weight: 85.8 kg Most recent weight: Weight: 84.7 kg     Palliative Assessment/Data: Presently patient is able to mobilize w/ two person assistance unable to safely swallow, has an NGT   Time In: 1650 Time Out: 1800 Time Total: 70 Greater than 50%  of this time was spent counseling and coordinating care related to the above assessment and plan.  Signed by: Rosezella Rumpf, NP  edicine Team phone at [phone] for questions and concerns.  For individual provider: See Amion Please contact Palliative M

## 2019-04-03 NOTE — Progress Notes (Signed)
This RN notified Dr. Sharolyn Douglas about patient having 6beats of vtach and has now converted to afib. Rate was around 130bpm but settled down to 50's-70's.  Order received for a stat 12 lead. This RN also informed MD of patient having a small BM that had a small quarter sized bloody mucous.  Md reports to monitor.

## 2019-04-03 NOTE — Plan of Care (Signed)
Progressing well with treatment plan.

## 2019-04-03 NOTE — Progress Notes (Addendum)
PROGRESS NOTE  Jonathan Berger XKG:[id number] DOB: 1931/05/10 DOA: 03/19/2019 PCP: [name], MD  HPI/Recap of past 24 hours: HPI from Dr [name] 86 yr old male with history of hypertension, who was admitted by Dr. [name] on 03/17/2019 for dense right ischemic MCA CVA, s/p TPA and VIR intervention with balloon angioplasty and stent placement at proximal R ICA. Pt developed right lower lobe aspiration pneumonitis after extubation on 03/26/2019 and was later re-intubated on 2/15 and extubated on 2/17 . Pt was also found to have new systolic CHF with EF 70%. Pt had been managed in the ICU and transferred to the floor, TRH assumed care on 04/03/19.    Today, pt noted to be restless in bed, pulled out his condom catheter, currently on restraints. Unable to fully understand his speech.  Patient with 6 beats of V. tach, and heart rate going as high as 120s.  Cardiology on board    Assessment/Plan: Principal Problem:   Middle cerebral artery embolism, right Active Problems:   Acute on chronic respiratory failure with hypoxia (HCC)   Systolic dysfunction   Dehydration   Aspiration pneumonia of right lower lobe due to gastric secretions (HCC)   Tachycardia   Hypoxia   Paroxysmal atrial fibrillation (HCC)   Multifocal atrial tachycardia (HCC)   Acute hypoxemic respiratory failure likely 2/2 recent CVA/Aspiration PNA Resolved Status post intubation x2, recently extubated on 04/01/2019 Currently on room air Currently afebrile, with resolving leukocytosis BC x2 NGTD Tracheal aspirate culture pending, Gram stain shows rare gram-positive cocci in clusters Continue cefepime and vancomycin until final culture report  Hypokalemia/hypophosphatemia Replace as needed  Right MCA infarct status post tPA and IR with L ICA stent placement MRI brain with right temporal lobe cortical/subcortical infarct with numerous small scattered R brain infarcts CTA head and neck showed right MCA LVO mid and distal  segment.;  Right ICA critical stenosis, left ICA distal bulb string sign, severe B VA origin stenosis Neurology on board Echo showed EF 45% Carotid Doppler showed left ICA 60 to 79% stenosis LDL 80, A1c 5.9 Continue Brilinta, aspirin, Lipitor PT/OT/SLP Plan for further left ICA intervention in the future  Acute systolic and diastolic HF/MAT, ??A. fib Echo showed EF of 45% No prior history of known CAD Cardiology on board, plan to repeat echo as an outpatient and consider ischemic evaluation if EF remains depressed Cardiology started patient on metoprolol for rate control, no need for [facility] unlikely A. fib  Hypertension Continue amlodipine, valsartan-hydrochlorothiazide on hold  Dysphagia/generalized deconditioning N.p.o. Currently on tube feeds 5 cortrak SLP, dietitian on board Plan for CIR  Dementia Currently with delirium, likely ICU delirium Continue Seroquel, Aricept, restraints         Malnutrition Type:  Nutrition Problem: Inadequate oral intake Etiology: inability to eat   Malnutrition Characteristics:  Signs/Symptoms: NPO status   Nutrition Interventions:  Interventions: MVI, Prostat, Tube feeding    Estimated body mass index is 26.04 kg/m as calculated from the following:   Height as of this encounter: '5\' 11"'  (1.803 m).   Weight as of this encounter: 84.7 kg.       Code Status: DNR  Family Communication: None at bedside  Disposition Plan: Currently being evaluated for CIR   Consultants:  PCCM  Neurology  Cardiology  Procedures: tPA and IR with L ICA stent placement  Antimicrobials:  Cefepime  Vancomycin  DVT prophylaxis: Lovenox   Objective: Vitals:   04/02/19 2027 04/03/19 0030 04/03/19 0451 04/03/19 0902  BP: Marland Kitchen)  163/86 (!) 154/85 (!) 154/82 (!) 145/84  Pulse: 61 87 77 93  Resp:  15 (!) 22 (!) 24  Temp: 98.1 F (36.7 C) 98.2 F (36.8 C) (!) 97.5 F (36.4 C) (!) 97.5 F (36.4 C)  TempSrc: Oral  Oral Oral  SpO2:  99% 97% 94% 98%  Weight:      Height:        Intake/Output Summary (Last 24 hours) at 04/03/2019 1031 Last data filed at 04/02/2019 1941 Gross per 24 hour  Intake 1638.62 ml  Output 950 ml  Net 688.62 ml   Filed Weights   03/31/19 0500 04/01/19 0500 04/02/19 0500  Weight: 81.2 kg 82.6 kg 84.7 kg    Exam:  General: NAD, restless, on restraints  Cardiovascular: S1, S2 present  Respiratory:  Diminished breath sounds at the bases  Abdomen: Soft, nontender, nondistended, bowel sounds present  Musculoskeletal: No bilateral pedal edema noted  Skin: Normal  Psychiatry:  Unable to assess  Neurology: Difficult to follow commands appropriately, moving all extremities, no obvious focal neurologic deficit noted   Data Reviewed: CBC: Recent Labs  Lab 03/30/19 0446 03/30/19 1626 03/30/19 1826 03/31/19 0000 04/01/19 0423 04/02/19 0250 04/03/19 0439  WBC 9.4  --   --  9.8 8.9 12.2* 11.5*  NEUTROABS  --   --   --   --  7.1  --  9.9*  HGB 12.6*   < > 11.9* 12.7* 10.7* 11.2* 11.7*  HCT 39.4   < > 35.0* 40.5 33.9* 35.3* 36.0*  MCV 102.9*  --   --  103.1* 103.0* 102.9* 99.4  PLT 193  --   --  223 181 181 236   < > = values in this interval not displayed.   Basic Metabolic Panel: Recent Labs  Lab 03/29/19 0425 03/29/19 0425 03/30/19 0446 03/30/19 0446 03/30/19 1826 03/31/19 0000 04/01/19 0423 04/02/19 0250 04/03/19 0439  NA 149*   < > 154*   < > 153* 153* 149* 148* 147*  K 2.9*   < > 3.7   < > 3.7 3.6 3.6 3.0* 2.8*  CL 115*   < > 121*  --   --  122* 123* 119* 115*  CO2 21*   < > 19*  --   --  19* 21* 21* 23  GLUCOSE 124*   < > 120*  --   --  133* 183* 164* 151*  BUN 25*   < > 26*  --   --  28* 34* 29* 19  CREATININE 1.22   < > 1.27*  --   --  1.31* 1.22 1.04 0.97  CALCIUM 9.4   < > 9.3  --   --  8.9 8.6* 8.5* 8.4*  MG 2.1  --   --   --   --   --  2.2 2.1 2.1  PHOS 1.8*  --   --   --   --   --  2.1* 1.7* 2.3*   < > = values in this interval not displayed.    GFR: Estimated Creatinine Clearance: 58.2 mL/min (by C-G formula based on SCr of 0.97 mg/dL). Liver Function Tests: No results for input(s): AST, ALT, ALKPHOS, BILITOT, PROT, ALBUMIN in the last 168 hours. No results for input(s): LIPASE, AMYLASE in the last 168 hours. No results for input(s): AMMONIA in the last 168 hours. Coagulation Profile: No results for input(s): INR, PROTIME in the last 168 hours. Cardiac Enzymes: No results for input(s): CKTOTAL, CKMB, CKMBINDEX,  TROPONINI in the last 168 hours. BNP (last 3 results) No results for input(s): PROBNP in the last 8760 hours. HbA1C: No results for input(s): HGBA1C in the last 72 hours. CBG: Recent Labs  Lab 04/01/19 0308 04/02/19 2020 04/03/19 0027 04/03/19 0449 04/03/19 0901  GLUCAP 156* 103* 134* 144* 149*   Lipid Profile: No results for input(s): CHOL, HDL, LDLCALC, TRIG, CHOLHDL, LDLDIRECT in the last 72 hours. Thyroid Function Tests: No results for input(s): TSH, T4TOTAL, FREET4, T3FREE, THYROIDAB in the last 72 hours. Anemia Panel: No results for input(s): VITAMINB12, FOLATE, FERRITIN, TIBC, IRON, RETICCTPCT in the last 72 hours. Urine analysis:    Component Value Date/Time   COLORURINE YELLOW 03/23/2019 1202   APPEARANCEUR CLEAR 03/23/2019 1202   LABSPEC 1.015 03/23/2019 1202   PHURINE 6.0 03/23/2019 1202   GLUCOSEU NEGATIVE 03/23/2019 1202   HGBUR LARGE (A) 03/23/2019 1202   BILIRUBINUR NEGATIVE 03/23/2019 1202   KETONESUR NEGATIVE 03/23/2019 1202   PROTEINUR NEGATIVE 03/23/2019 1202   UROBILINOGEN 0.2 05/07/2011 1128   NITRITE NEGATIVE 03/23/2019 1202   LEUKOCYTESUR TRACE (A) 03/23/2019 1202   Sepsis Labs: '@LABRCNTIP' (procalcitonin:4,lacticidven:4)  ) Recent Results (from the past 240 hour(s))  Culture, blood (x 2)     Status: None   Collection Time: 03/29/19 11:49 AM   Specimen: BLOOD  Result Value Ref Range Status   Specimen Description BLOOD RIGHT ANTECUBITAL  Final   Special Requests   Final     BOTTLES DRAWN AEROBIC ONLY Blood Culture adequate volume   Culture   Final    NO GROWTH 5 DAYS Performed at [facility], [place] [address] [place], [place] [address]    Report Status 04/03/2019 FINAL  Final  Culture, blood (x 2)     Status: None   Collection Time: 03/29/19 11:55 AM   Specimen: BLOOD RIGHT FOREARM  Result Value Ref Range Status   Specimen Description BLOOD RIGHT FOREARM  Final   Special Requests   Final    BOTTLES DRAWN AEROBIC ONLY Blood Culture adequate volume   Culture   Final    NO GROWTH 5 DAYS Performed at [facility], [place] [address] [place], [place] [address]    Report Status 04/03/2019 FINAL  Final  Culture, respiratory (non-expectorated)     Status: None (Preliminary result)   Collection Time: 04/01/19  9:15 AM   Specimen: Tracheal Aspirate; Respiratory  Result Value Ref Range Status   Specimen Description TRACHEAL ASPIRATE  Final   Special Requests NONE  Final   Gram Stain   Final    FEW WBC PRESENT,BOTH PMN AND MONONUCLEAR RARE YEAST RARE GRAM POSITIVE COCCI IN CLUSTERS    Culture   Final    CULTURE REINCUBATED FOR BETTER GROWTH Performed at [facility], [place] [address] [place], [place] [address]    Report Status PENDING  Incomplete      Studies: DG Chest Port 1 View  Result Date: 04/03/2019 CLINICAL DATA:  Respiratory failure.  Cough. EXAM: PORTABLE CHEST 1 VIEW COMPARISON:  04/02/2019.  04/01/2019. FINDINGS: Feeding tube noted with tip below left hemidiaphragm. Stable cardiomegaly. Persistent right upper and right lower infiltrates are again noted without interim change. Left base infiltrate unchanged. No pleural effusion or pneumothorax. IMPRESSION: 1.  Feeding tube in stable position. 2.  Stable cardiomegaly. 3. Persistent right upper and right lower infiltrates. Persistent left base infiltrate. No interim change. Electronically Signed   By: Marcello Moores  Register   On: 04/03/2019 06:52  Scheduled Meds: . aspirin  81 mg  Per Tube Daily  . atorvastatin  40 mg Per Tube q1800  . chlorhexidine gluconate (MEDLINE KIT)  15 mL Mouth Rinse BID  . Chlorhexidine Gluconate Cloth  6 each Topical Daily  . donepezil  5 mg Per Tube QHS  . doxazosin  1 mg Per Tube Daily  . enoxaparin (LOVENOX) injection  40 mg Subcutaneous Q24H  . feeding supplement (OSMOLITE 1.5 CAL)  1,000 mL Per Tube Q24H  . feeding supplement (PRO-STAT SUGAR FREE 64)  30 mL Per Tube TID  . folic acid  1 mg Per Tube Daily  . free water  350 mL Per Tube Q4H  . mouth rinse  15 mL Mouth Rinse 10 times per day  . metoprolol tartrate  25 mg Per Tube BID  . pantoprazole sodium  40 mg Per Tube BID  . thiamine  100 mg Per Tube Daily  . ticagrelor  90 mg Oral BID   Or  . ticagrelor  90 mg Per Tube BID    Continuous Infusions: . sodium chloride    . sodium chloride Stopped (04/01/19 1509)  . ceFEPime (MAXIPIME) IV 2 g (04/02/19 2103)  . potassium chloride 10 mEq (04/03/19 1027)  . vancomycin Stopped (04/02/19 1241)     LOS: 12 days     Alma Friendly, MD [facility]  If 7PM-7AM, please contact night-coverage [email or web address] 04/03/2019, 10:31 AM

## 2019-04-03 NOTE — Progress Notes (Signed)
Progress Note  Patient Name: Jonathan Berger Date of Encounter: 04/03/2019  Primary Cardiologist: Donato Heinz, MD   Subjective   Feeling well.  Denies palpitations.  Breathing much better  Inpatient Medications    Scheduled Meds: . aspirin  81 mg Per Tube Daily  . atorvastatin  40 mg Per Tube q1800  . chlorhexidine gluconate (MEDLINE KIT)  15 mL Mouth Rinse BID  . Chlorhexidine Gluconate Cloth  6 each Topical Daily  . donepezil  5 mg Per Tube QHS  . doxazosin  1 mg Per Tube Daily  . enoxaparin (LOVENOX) injection  40 mg Subcutaneous Q24H  . feeding supplement (OSMOLITE 1.5 CAL)  1,000 mL Per Tube Q24H  . feeding supplement (PRO-STAT SUGAR FREE 64)  30 mL Per Tube TID  . folic acid  1 mg Per Tube Daily  . free water  350 mL Per Tube Q4H  . mouth rinse  15 mL Mouth Rinse 10 times per day  . pantoprazole sodium  40 mg Per Tube BID  . thiamine  100 mg Per Tube Daily  . ticagrelor  90 mg Oral BID   Or  . ticagrelor  90 mg Per Tube BID   Continuous Infusions: . sodium chloride    . sodium chloride Stopped (04/01/19 1509)  . ceFEPime (MAXIPIME) IV 2 g (04/02/19 2103)  . potassium chloride    . vancomycin Stopped (04/02/19 1241)   PRN Meds: acetaminophen **OR** acetaminophen (TYLENOL) oral liquid 160 mg/5 mL **OR** acetaminophen, bisacodyl, docusate sodium, metoprolol tartrate, nitroGLYCERIN, sodium chloride flush   Vital Signs    Vitals:   04/02/19 2000 04/02/19 2027 04/03/19 0030 04/03/19 0451  BP: 127/67 (!) 163/86 (!) 154/85 (!) 154/82  Pulse: 100 61 87 77  Resp:   15 (!) 22  Temp:  98.1 F (36.7 C) 98.2 F (36.8 C) (!) 97.5 F (36.4 C)  TempSrc:  Oral  Oral  SpO2: 100% 99% 97% 94%  Weight:      Height:        Intake/Output Summary (Last 24 hours) at 04/03/2019 0853 Last data filed at 04/02/2019 1941 Gross per 24 hour  Intake 1638.62 ml  Output 950 ml  Net 688.62 ml   Last 3 Weights 04/02/2019 04/01/2019 03/31/2019  Weight (lbs) 186 lb 11.7 oz  182 lb 1.6 oz 179 lb 0.2 oz  Weight (kg) 84.7 kg 82.6 kg 81.2 kg      Telemetry    MAT.  PVCs.  NSVT - Personally Reviewed  ECG    04/03/19: Multifocal atrial tachycardia.  Rate 121 bpm.  PVCs. LAD.  Cannot rule out prior anterior infarct.  - Personally Reviewed  Physical Exam   VS:  BP (!) 145/84 (BP Location: Left Arm)   Pulse 93   Temp (!) 97.5 F (36.4 C) (Oral)   Resp (!) 24   Ht 5' 11" (1.803 m)   Wt 84.7 kg   SpO2 98%   BMI 26.04 kg/m  , BMI Body mass index is 26.04 kg/m. GENERAL:  Chronically ill-appearing.  Frail. HEENT: Pupils equal round and reactive, fundi not visualized, oral mucosa dry NECK:  No jugular venous distention, waveform within normal limits, carotid upstroke brisk and symmetric, no bruits LUNGS:  Clear to auscultation bilaterally HEART:  Tachycardic.  Irregularly irregular.  PMI not displaced or sustained,S1 and S2 within normal limits, no S3, no S4, no clicks, no rubs, no murmurs ABD:  Flat, positive bowel sounds normal in frequency in pitch,  no bruits, no rebound, no guarding, no midline pulsatile mass, no hepatomegaly, no splenomegaly EXT:  2 plus pulses throughout, no edema, no cyanosis no clubbing SKIN:  No rashes no nodules NEURO:  Cranial nerves II through XII grossly intact, motor grossly intact throughout PSYCH:  Cognitively intact, oriented to person place and time   Labs    High Sensitivity Troponin:   Recent Labs  Lab 03/24/19 0950 03/29/19 0920 03/29/19 1149  TROPONINIHS 27* 30* 33*      Chemistry Recent Labs  Lab 04/01/19 0423 04/02/19 0250 04/03/19 0439  NA 149* 148* 147*  K 3.6 3.0* 2.8*  CL 123* 119* 115*  CO2 21* 21* 23  GLUCOSE 183* 164* 151*  BUN 34* 29* 19  CREATININE 1.22 1.04 0.97  CALCIUM 8.6* 8.5* 8.4*  GFRNONAA 53* >60 >60  GFRAA >60 >60 >60  ANIONGAP _0 Hematology Recent Labs  Lab 04/01/19 0423 04/02/19 0250 04/03/19 0439  WBC 8.9 12.2* 11.5*  RBC 3.29* 3.43* 3.62*  HGB 10.7* 11.2*  11.7*  HCT 33.9* 35.3* 36.0*  MCV 103.0* 102.9* 99.4  MCH 32.5 32.7 32.3  MCHC 31.6 31.7 32.5  RDW 14.6 14.3 13.9  PLT 181 181 236    BNPNo results for input(s): BNP, PROBNP in the last 168 hours.   DDimer  Recent Labs  Lab 03/29/19 0920  DDIMER 4.72*     Radiology    DG Chest Port 1 View  Result Date: 04/03/2019 CLINICAL DATA:  Respiratory failure.  Cough. EXAM: PORTABLE CHEST 1 VIEW COMPARISON:  04/02/2019.  04/01/2019. FINDINGS: Feeding tube noted with tip below left hemidiaphragm. Stable cardiomegaly. Persistent right upper and right lower infiltrates are again noted without interim change. Left base infiltrate unchanged. No pleural effusion or pneumothorax. IMPRESSION: 1.  Feeding tube in stable position. 2.  Stable cardiomegaly. 3. Persistent right upper and right lower infiltrates. Persistent left base infiltrate. No interim change. Electronically Signed   By: Marcello Moores  Register   On: 04/03/2019 06:52   DG Chest Port 1 View  Result Date: 04/02/2019 CLINICAL DATA:  86 year old who presented with a stroke on 04/11/2019 for which thrombolysis of the RIGHT middle cerebral artery was performed. Extubation. EXAM: PORTABLE CHEST 1 VIEW COMPARISON:  04/01/2019 and earlier. FINDINGS: Interval extubation since yesterday. Persistent patchy airspace opacities in the RIGHT perihilar location, unchanged. Focal consolidation in the RIGHT upper lobe, unchanged. Mildly prominent bronchovascular markings diffusely and mild central peribronchial thickening, unchanged. No new pulmonary parenchymal abnormalities. No visible pleural effusions. Feeding tube courses below the diaphragm into the stomach though its tip is not included on the image. IMPRESSION: 1. Stable pneumonia involving the RIGHT upper lobe and the RIGHT perihilar location. 2. No new abnormalities. Electronically Signed   By: Evangeline Dakin M.D.   On: 04/02/2019 08:45    Cardiac Studies   Echo 03/23/19: IMPRESSIONS    1. Left  ventricular ejection fraction, by estimation, is 45%. The left  ventricle has mildly decreased function. The left ventrical demonstrates  regional wall motion abnormalities (see scoring diagram/findings for  description). Akinesis of the mid to  apical anteroseptal and inferoseptal walls. Left ventricular diastolic  parameters are consistent with Grade I diastolic dysfunction (impaired  relaxation).  2. Right ventricular systolic function is mildly reduced. The right  ventricular size is mildly enlarged. There is mildly elevated pulmonary  artery systolic pressure. The estimated right ventricular systolic  pressure is 14.4 mmHg.  3. The aortic valve is tricuspid. Trivial  aortic insufficiency, no aortic  stenosis.  4. No significant mitral regurgitation.  5. Dilated IVC with estimated RA pressure 15 mmHg.   Patient Profile     Mr. Yono is an 88M with hypertension, dementia, and prior tobacco abuse admitted with R ICA stenosis and stroke.   Assessment & Plan    # MAT:  Mr. Gawron' rhythm is primarily multifocal atrial tachycardia.  There are some areas where it is difficult to find P waves.  However, even in these areas, MAT is clearly seen before and after these episodes.  I doubt that atrial fibrillation is present, though it is difficult to be certain.  The risks of triple therapy outweigh the benefit.  Would favor continuing DAPT for his carotid stenting and CVA rather than adding anticoagulation.  Will add metoprolol for rate control given that his BP has improved.  Aggressively supplement potassium.  # Stroke: Admitted 2/7 with acute R MCA stroke.  He underwent TPA and stenting of the R ICA.  Now on aspirin and ticagrelor.  # Hypertension:  Home amlodipine, valsartan/HCTZ on hold.  Adding metoprolol as above.   # Acute systolic and diastolic heart failure: LVEF 45% this admission.  No h/o CAD.  Plan to repeat echo as an outpatient and consider ischemic evaluation if LVEF  remains depressed and he is clinically stable.  Consolidate metoprolol when able.     # Acute hypoxic respiratory failure: Intubated twice this hospitalization for pneumonia.  Extubated 2/17 and now stable.  Lungs clear on exam.       For questions or updates, please contact [facility] Please consult [email or web address] for contact info under        Signed,  [place], MD  04/03/2019, 8:53 AM    

## 2019-04-04 ENCOUNTER — Inpatient Hospital Stay (HOSPITAL_COMMUNITY): Payer: Medicare Other

## 2019-04-04 DIAGNOSIS — E860 Dehydration: Secondary | ICD-10-CM

## 2019-04-04 LAB — CBC WITH DIFFERENTIAL/PLATELET
Abs Immature Granulocytes: 0.06 10*3/uL (ref 0.00–0.07)
Basophils Absolute: 0 10*3/uL (ref 0.0–0.1)
Basophils Relative: 0 %
Eosinophils Absolute: 0.1 10*3/uL (ref 0.0–0.5)
Eosinophils Relative: 1 %
HCT: 34.5 % — ABNORMAL LOW (ref 39.0–52.0)
Hemoglobin: 11.4 g/dL — ABNORMAL LOW (ref 13.0–17.0)
Immature Granulocytes: 1 %
Lymphocytes Relative: 12 %
Lymphs Abs: 1.3 10*3/uL (ref 0.7–4.0)
MCH: 32.3 pg (ref 26.0–34.0)
MCHC: 33 g/dL (ref 30.0–36.0)
MCV: 97.7 fL (ref 80.0–100.0)
Monocytes Absolute: 0.6 10*3/uL (ref 0.1–1.0)
Monocytes Relative: 5 %
Neutro Abs: 8.9 10*3/uL — ABNORMAL HIGH (ref 1.7–7.7)
Neutrophils Relative %: 81 %
Platelets: 253 10*3/uL (ref 150–400)
RBC: 3.53 MIL/uL — ABNORMAL LOW (ref 4.22–5.81)
RDW: 13.8 % (ref 11.5–15.5)
WBC: 11 10*3/uL — ABNORMAL HIGH (ref 4.0–10.5)
nRBC: 0 % (ref 0.0–0.2)

## 2019-04-04 LAB — C DIFFICILE QUICK SCREEN W PCR REFLEX
C Diff antigen: NEGATIVE
C Diff interpretation: NOT DETECTED
C Diff toxin: NEGATIVE

## 2019-04-04 LAB — GLUCOSE, CAPILLARY
Glucose-Capillary: 147 mg/dL — ABNORMAL HIGH (ref 70–99)
Glucose-Capillary: 153 mg/dL — ABNORMAL HIGH (ref 70–99)
Glucose-Capillary: 136 mg/dL — ABNORMAL HIGH (ref 70–99)
Glucose-Capillary: 95 mg/dL (ref 70–99)
Glucose-Capillary: 107 mg/dL — ABNORMAL HIGH (ref 70–99)
Glucose-Capillary: 111 mg/dL — ABNORMAL HIGH (ref 70–99)

## 2019-04-04 LAB — BASIC METABOLIC PANEL
Anion gap: 10 (ref 5–15)
BUN: 19 mg/dL (ref 8–23)
CO2: 22 mmol/L (ref 22–32)
Calcium: 8.6 mg/dL — ABNORMAL LOW (ref 8.9–10.3)
Chloride: 112 mmol/L — ABNORMAL HIGH (ref 98–111)
Creatinine, Ser: 1.03 mg/dL (ref 0.61–1.24)
GFR calc Af Amer: >60 mL/min (ref >60)
GFR calc non Af Amer: >60 mL/min (ref >60)
Glucose, Bld: 148 mg/dL — ABNORMAL HIGH (ref 70–99)
Potassium: 2.8 mmol/L — ABNORMAL LOW (ref 3.5–5.1)
Sodium: 144 mmol/L (ref 135–145)

## 2019-04-04 MED ORDER — LORAZEPAM 2 MG/ML IJ SOLN
0.5000 mg | Freq: Once | INTRAMUSCULAR | Status: AC
  Administered 2019-04-04: 0.5 mg via INTRAVENOUS
  Filled 2019-04-04: qty 1

## 2019-04-04 MED ORDER — LEVALBUTEROL HCL 0.63 MG/3ML IN NEBU
0.6300 mg | INHALATION_SOLUTION | RESPIRATORY_TRACT | Status: DC | PRN
  Filled 2019-04-04: qty 3

## 2019-04-04 MED ORDER — POTASSIUM CHLORIDE 10 MEQ/100ML IV SOLN
10.0000 meq | Freq: Once | INTRAVENOUS | Status: AC
  Administered 2019-04-04: 10 meq via INTRAVENOUS
  Filled 2019-04-04: qty 100

## 2019-04-04 MED ORDER — LORAZEPAM 2 MG/ML IJ SOLN
0.5000 mg | Freq: Once | INTRAMUSCULAR | Status: AC
  Administered 2019-04-04: 17:00:00 0.5 mg via INTRAVENOUS
  Filled 2019-04-04: qty 1

## 2019-04-04 MED ORDER — POTASSIUM CHLORIDE 20 MEQ/15ML (10%) PO SOLN
40.0000 meq | Freq: Two times a day (BID) | ORAL | Status: AC
  Administered 2019-04-04 (×2): 40 meq
  Filled 2019-04-04 (×2): qty 30

## 2019-04-04 MED ORDER — LEVALBUTEROL HCL 0.63 MG/3ML IN NEBU
0.6300 mg | INHALATION_SOLUTION | Freq: Three times a day (TID) | RESPIRATORY_TRACT | Status: DC
  Filled 2019-04-04: qty 3

## 2019-04-04 MED ORDER — LEVALBUTEROL HCL 0.63 MG/3ML IN NEBU
0.6300 mg | INHALATION_SOLUTION | Freq: Three times a day (TID) | RESPIRATORY_TRACT | Status: DC
  Administered 2019-04-05 – 2019-04-06 (×6): 0.63 mg via RESPIRATORY_TRACT
  Filled 2019-04-04 (×6): qty 3

## 2019-04-04 MED ORDER — POTASSIUM CHLORIDE 10 MEQ/100ML IV SOLN
10.0000 meq | INTRAVENOUS | Status: AC
  Administered 2019-04-04 (×3): 10 meq via INTRAVENOUS
  Filled 2019-04-04 (×3): qty 100

## 2019-04-04 MED ORDER — METOPROLOL TARTRATE 25 MG/10 ML ORAL SUSPENSION
50.0000 mg | Freq: Two times a day (BID) | ORAL | Status: DC
  Administered 2019-04-04 – 2019-04-06 (×5): 50 mg
  Filled 2019-04-04 (×7): qty 20

## 2019-04-04 MED ORDER — FREE WATER
300.0000 mL | Freq: Four times a day (QID) | Status: DC
  Administered 2019-04-04 – 2019-04-06 (×8): 300 mL

## 2019-04-04 MED ORDER — FUROSEMIDE 10 MG/ML IJ SOLN
40.0000 mg | Freq: Once | INTRAMUSCULAR | Status: AC
  Administered 2019-04-04: 17:00:00 40 mg via INTRAVENOUS
  Filled 2019-04-04: qty 4

## 2019-04-04 MED ORDER — GERHARDT'S BUTT CREAM
TOPICAL_CREAM | Freq: Two times a day (BID) | CUTANEOUS | Status: DC
  Administered 2019-04-12: 1 via TOPICAL
  Filled 2019-04-04 (×3): qty 1

## 2019-04-04 NOTE — Progress Notes (Signed)
Responding to Spiritual Consult met [name] and his wife Jonathan Berger.  Initiated a relationship of care and concern. [name] seemed to know I was there.  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  Employed active listening and reflecting as Ms Jonathan Berger reported [name] is very proud of his Armed forces logistics/support/administrative officer.  She says they have been married 82 years.  Validated she has more help from friends and family than she needs.  [name] became restless, scooting to the bottom of the bed.  Ms Jonathan Berger said he is often restless. There being nothing I further I could do for them, I departed with an invitation to call on the Chaplain at any time they have a need.  De Burrs Chaplain Resident

## 2019-04-04 NOTE — Progress Notes (Signed)
PROGRESS NOTE  Jonathan Berger GNF:[id number] DOB: [birth year] DOA: 04/10/2019 PCP: [name], MD  HPI/Recap of past 24 hours: HPI from Dr [name] 86 yr old male with history of hypertension, who was admitted by Dr. [name] on 04/07/2019 for dense right ischemic MCA CVA, s/p TPA and VIR intervention with balloon angioplasty and stent placement at proximal R ICA. Pt developed right lower lobe aspiration pneumonitis after extubation on 03/26/2019 and was later re-intubated on 2/15 and extubated on 2/17 . Pt was also found to have new systolic CHF with EF 84%. Pt had been managed in the ICU and transferred to the floor, TRH assumed care on 04/03/19.    Today, patient noted to still be restless, still requiring one-to-one sitter.  Later today, got a page from nurse, patient has had multiple loose bowel movements, noted to be wheezing   Assessment/Plan: Principal Problem:   Middle cerebral artery embolism, right Active Problems:   Acute on chronic respiratory failure with hypoxia (HCC)   Systolic dysfunction   Dehydration   Aspiration pneumonia of right lower lobe due to gastric secretions (HCC)   Tachycardia   Hypoxia   Paroxysmal atrial fibrillation (HCC)   Multifocal atrial tachycardia (HCC)   Palliative care by specialist   Goals of care, counseling/discussion   DNR (do not resuscitate)   Muscular weakness   Dysphagia   Xerostomia   Delirium   Acute hypoxemic respiratory failure likely 2/2 recent CVA/Aspiration PNA/acute CHF Status post intubation x2, recently extubated on 04/01/2019 Currently on room air Currently afebrile, with resolving leukocytosis BC x2 NGTD Tracheal aspirate culture growing staph epidermidis Repeat chest x-ray showed pulmonary vascular congestion We will give 1 dose of IV Lasix, decrease free water to 350ms Q6H Duo nebs scheduled, as needed Supplemental oxygen as needed Continue cefepime and vancomycin  Hypokalemia/hypophosphatemia Replace as  needed  Right MCA infarct status post tPA and IR with L ICA stent placement MRI brain with right temporal lobe cortical/subcortical infarct with numerous small scattered R brain infarcts CTA head and neck showed right MCA LVO mid and distal segment.;  Right ICA critical stenosis, left ICA distal bulb string sign, severe B VA origin stenosis Neurology on board Echo showed EF 469% grade 1 diastolic dysfunction Carotid Doppler showed left ICA 60 to 79% stenosis LDL 80, A1c 5.9 Continue Brilinta, aspirin, Lipitor PT/OT/SLP Plan for further left ICA intervention in the future  Acute systolic and diastolic HF/MAT, ??A. fib Echo showed EF of 462% grade 1 diastolic dysfunction No prior history of known CAD Cardiology on board, plan to repeat echo as an outpatient and consider ischemic evaluation if EF remains depressed Cardiology on board, increased metoprolol for rate control, no need for AC unlikely A. Fib (although hard to determine, also risk of triple therapy outweigh benefit) Due to repeat chest x-ray as above, will give 1 dose of Lasix  Hypertension Continue amlodipine, valsartan-hydrochlorothiazide on hold  Dysphagia/generalized deconditioning N.p.o. Currently on tube feeds via cortrak SLP, dietitian on board Plan for CIR  ??Diarrhea Multiple bowel movements noted Rule out C. difficile as patient is on antibiotics, altered, leukocytosis C. difficile panel pending Insert rectal tube  Dementia Currently with delirium, likely ICU delirium Continue Seroquel, Aricept, restraints  GEast PointPatient with very poor prognosis Palliative consulted for further goals of care         Malnutrition Type:  Nutrition Problem: Inadequate oral intake Etiology: inability to eat   Malnutrition Characteristics:  Signs/Symptoms: NPO status   Nutrition Interventions:  Interventions: MVI, Prostat, Tube feeding    Estimated body mass index is 26.04 kg/m as calculated from the  following:   Height as of this encounter: '5\' 11"'  (1.803 m).   Weight as of this encounter: 84.7 kg.       Code Status: DNR  Family Communication: None at bedside  Disposition Plan: Likely CIR   Consultants:  PCCM  Neurology  Cardiology  Palliative  Procedures: tPA and IR with L ICA stent placement  Antimicrobials:  Cefepime  Vancomycin  DVT prophylaxis: Lovenox   Objective: Vitals:   04/03/19 2312 04/04/19 0440 04/04/19 0756 04/04/19 1218  BP: (!) 154/86 (!) 155/83 (!) 147/75 118/84  Pulse: 86 99 (!) 101 98  Resp: (!) 22 (!) 22 20   Temp: (!) 97.5 F (36.4 C) (!) 97.3 F (36.3 C) 98.8 F (37.1 C) 98.4 F (36.9 C)  TempSrc: Oral Oral Oral Oral  SpO2: 94% 96% 98% 97%  Weight:      Height:       No intake or output data in the 24 hours ending 04/04/19 1554 Filed Weights   03/31/19 0500 04/01/19 0500 04/02/19 0500  Weight: 81.2 kg 82.6 kg 84.7 kg    Exam:  General: NAD, restless, on restraints  Cardiovascular: S1, S2 present  Respiratory:  Diminished breath sounds at the bases  Abdomen: Soft, nontender, nondistended, bowel sounds present  Musculoskeletal: No bilateral pedal edema noted  Skin: Normal  Psychiatry: Unable to assess  Neurology: Difficult to follow commands appropriately, moving all extremities, no obvious focal neurologic deficits noted   Data Reviewed: CBC: Recent Labs  Lab 03/31/19 0000 04/01/19 0423 04/02/19 0250 04/03/19 0439 04/04/19 0331  WBC 9.8 8.9 12.2* 11.5* 11.0*  NEUTROABS  --  7.1  --  9.9* 8.9*  HGB 12.7* 10.7* 11.2* 11.7* 11.4*  HCT 40.5 33.9* 35.3* 36.0* 34.5*  MCV 103.1* 103.0* 102.9* 99.4 97.7  PLT 223 181 181 236 989   Basic Metabolic Panel: Recent Labs  Lab 03/29/19 0425 03/30/19 0446 03/31/19 0000 04/01/19 0423 04/02/19 0250 04/03/19 0439 04/04/19 0331  NA 149*   < > 153* 149* 148* 147* 144  K 2.9*   < > 3.6 3.6 3.0* 2.8* 2.8*  CL 115*   < > 122* 123* 119* 115* 112*  CO2 21*   < >  19* 21* 21* 23 22  GLUCOSE 124*   < > 133* 183* 164* 151* 148*  BUN 25*   < > 28* 34* 29* 19 19  CREATININE 1.22   < > 1.31* 1.22 1.04 0.97 1.03  CALCIUM 9.4   < > 8.9 8.6* 8.5* 8.4* 8.6*  MG 2.1  --   --  2.2 2.1 2.1  --   PHOS 1.8*  --   --  2.1* 1.7* 2.3*  --    < > = values in this interval not displayed.   GFR: Estimated Creatinine Clearance: 54.8 mL/min (by C-G formula based on SCr of 1.03 mg/dL). Liver Function Tests: No results for input(s): AST, ALT, ALKPHOS, BILITOT, PROT, ALBUMIN in the last 168 hours. No results for input(s): LIPASE, AMYLASE in the last 168 hours. No results for input(s): AMMONIA in the last 168 hours. Coagulation Profile: No results for input(s): INR, PROTIME in the last 168 hours. Cardiac Enzymes: No results for input(s): CKTOTAL, CKMB, CKMBINDEX, TROPONINI in the last 168 hours. BNP (last 3 results) No results for input(s): PROBNP in the last 8760 hours. HbA1C: No results for input(s): HGBA1C in  the last 72 hours. CBG: Recent Labs  Lab 04/03/19 2109 04/03/19 2308 04/04/19 0444 04/04/19 0753 04/04/19 1213  GLUCAP 127* 105* 147* 153* 136*   Lipid Profile: No results for input(s): CHOL, HDL, LDLCALC, TRIG, CHOLHDL, LDLDIRECT in the last 72 hours. Thyroid Function Tests: No results for input(s): TSH, T4TOTAL, FREET4, T3FREE, THYROIDAB in the last 72 hours. Anemia Panel: No results for input(s): VITAMINB12, FOLATE, FERRITIN, TIBC, IRON, RETICCTPCT in the last 72 hours. Urine analysis:    Component Value Date/Time   COLORURINE YELLOW 03/23/2019 1202   APPEARANCEUR CLEAR 03/23/2019 1202   LABSPEC 1.015 03/23/2019 1202   PHURINE 6.0 03/23/2019 1202   GLUCOSEU NEGATIVE 03/23/2019 1202   HGBUR LARGE (A) 03/23/2019 1202   BILIRUBINUR NEGATIVE 03/23/2019 1202   KETONESUR NEGATIVE 03/23/2019 1202   PROTEINUR NEGATIVE 03/23/2019 1202   UROBILINOGEN 0.2 05/07/2011 1128   NITRITE NEGATIVE 03/23/2019 1202   LEUKOCYTESUR TRACE (A) 03/23/2019 1202    Sepsis Labs: '@LABRCNTIP' (procalcitonin:4,lacticidven:4)  ) Recent Results (from the past 240 hour(s))  Culture, blood (x 2)     Status: None   Collection Time: 03/29/19 11:49 AM   Specimen: BLOOD  Result Value Ref Range Status   Specimen Description BLOOD RIGHT ANTECUBITAL  Final   Special Requests   Final    BOTTLES DRAWN AEROBIC ONLY Blood Culture adequate volume   Culture   Final    NO GROWTH 5 DAYS Performed at [facility], [place] [address], [place], [place] [address]    Report Status 04/03/2019 FINAL  Final  Culture, blood (x 2)     Status: None   Collection Time: 03/29/19 11:55 AM   Specimen: BLOOD RIGHT FOREARM  Result Value Ref Range Status   Specimen Description BLOOD RIGHT FOREARM  Final   Special Requests   Final    BOTTLES DRAWN AEROBIC ONLY Blood Culture adequate volume   Culture   Final    NO GROWTH 5 DAYS Performed at [facility], [name] [address] [place], [name] [address]    Report Status 04/03/2019 FINAL  Final  Culture, respiratory (non-expectorated)     Status: None (Preliminary result)   Collection Time: 04/01/19  9:15 AM   Specimen: Tracheal Aspirate; Respiratory  Result Value Ref Range Status   Specimen Description TRACHEAL ASPIRATE  Final   Special Requests NONE  Final   Gram Stain   Final    FEW WBC PRESENT,BOTH PMN AND MONONUCLEAR RARE YEAST RARE GRAM POSITIVE COCCI IN CLUSTERS Performed at [facility], [place] [address], [place], [place] [address]    Culture FEW STAPHYLOCOCCUS EPIDERMIDIS FEW YEAST   Final   Report Status PENDING  Incomplete   Organism ID, Bacteria STAPHYLOCOCCUS EPIDERMIDIS  Final      Susceptibility   Staphylococcus epidermidis - MIC*    CIPROFLOXACIN <=0.5 SENSITIVE Sensitive     ERYTHROMYCIN >=8 RESISTANT Resistant     GENTAMICIN <=0.5 SENSITIVE Sensitive     OXACILLIN >=4 RESISTANT Resistant     TETRACYCLINE 2 SENSITIVE Sensitive     VANCOMYCIN 2 SENSITIVE Sensitive     TRIMETH/SULFA 160 RESISTANT  Resistant     CLINDAMYCIN <=0.25 SENSITIVE Sensitive     RIFAMPIN <=0.5 SENSITIVE Sensitive     Inducible Clindamycin NEGATIVE Sensitive     * FEW STAPHYLOCOCCUS EPIDERMIDIS      Studies: DG Chest Port 1 View  Result Date: 04/04/2019 CLINICAL DATA:  Wheezing EXAM: PORTABLE CHEST 1 VIEW COMPARISON:  Chest radiograph 04/03/2019 FINDINGS:  Enteric tube courses below the field of view. Moderate cardiomegaly and central pulmonary vascular congestion. No pleural effusion or pneumothorax. No focal consolidation. IMPRESSION: Cardiomegaly and central pulmonary vascular congestion. Electronically Signed   By: Ulyses Jarred M.D.   On: 04/04/2019 15:13    Scheduled Meds: . amLODipine  5 mg Per Tube Daily  . aspirin  81 mg Per Tube Daily  . atorvastatin  40 mg Per Tube q1800  . chlorhexidine gluconate (MEDLINE KIT)  15 mL Mouth Rinse BID  . Chlorhexidine Gluconate Cloth  6 each Topical Daily  . donepezil  5 mg Per Tube QHS  . doxazosin  1 mg Per Tube Daily  . enoxaparin (LOVENOX) injection  40 mg Subcutaneous Q24H  . feeding supplement (OSMOLITE 1.5 CAL)  1,000 mL Per Tube Q24H  . feeding supplement (PRO-STAT SUGAR FREE 64)  30 mL Per Tube TID  . folic acid  1 mg Per Tube Daily  . free water  350 mL Per Tube Q4H  . Gerhardt's butt cream   Topical BID  . levalbuterol  0.63 mg Nebulization Q8H  . mouth rinse  15 mL Mouth Rinse 10 times per day  . metoprolol tartrate  50 mg Per Tube BID  . pantoprazole sodium  40 mg Per Tube BID  . potassium chloride  40 mEq Per Tube BID  . thiamine  100 mg Per Tube Daily  . ticagrelor  90 mg Oral BID   Or  . ticagrelor  90 mg Per Tube BID    Continuous Infusions: . sodium chloride    . sodium chloride Stopped (04/01/19 1509)  . ceFEPime (MAXIPIME) IV 2 g (04/04/19 1056)     LOS: 13 days     Alma Friendly, MD [facility]  If 7PM-7AM, please contact night-coverage [email or web address] 04/04/2019, 3:54 PM

## 2019-04-04 NOTE — Progress Notes (Signed)
Palliative Medicine Inpatient Follow Up Note  HPI: Per hospitalist note --> 79 yr oldmale with history of hypertension, who was admitted by Dr. [name] on 04/02/2019 for dense right ischemic MCA CVA, s/p TPA and VIR intervention with balloon angioplasty and stent placement at proximal R ICA.Ptdeveloped right lower lobe aspiration pneumonitis after extubation on 2/11/2021and was later re-intubated on 2/15 and extubated on 2/17 . Ptwas alsofound to have new systolic CHF with EF 89%.Pt had been managed in the ICU and transferred to the floor, TRH assumed care on 04/03/19.  Today's Discussion (04/04/2019): Chart reviewed. Met with Dejour and his wife, [name] at bedside. Shermon was bright eyed and able to mumble a few words which did seem to be appropriate responses. [name] said that she is hopeful that he continues to improve. She is anxious for him to get to CIR. Discussed his clinical state. He is noted to now have a sitter at bedside.   All questions answered.   Vital Signs Vitals:   04/04/19 0756 04/04/19 1218  BP: (!) 147/75 118/84  Pulse: (!) 101 98  Resp: 20   Temp: 98.8 F (37.1 C) 98.4 F (36.9 C)  SpO2: 98% 97%   No intake or output data in the 24 hours ending 04/04/19 1642 Last Weight  Most recent update: 04/02/2019  6:29 AM   Weight  84.7 kg (186 lb 11.7 oz)           Physical Exam Vitals and nursing note reviewed.  HENT:     Head: Normocephalic.     Nose:     Comments: Coretrack in place    Mouth/Throat:     Mouth: Mucous membranes are dry.  Eyes:     Pupils: Pupils are equal, round, and reactive to light.  Cardiovascular:     Rate and Rhythm: Tachycardia present. Rhythm irregular.  Musculoskeletal:     Cervical back: Normal range of motion.  Skin:    General: Skin is warm.  Neurological:     Mental Status: He is alert. He is disoriented.  Psychiatric:         Judgment: Judgment is impulsive.   SUMMARY OF RECOMMENDATIONS   DNR/DNI  Patients wife is  still thinking about the concept of long term tube feeding via a g-tube  Plan to transition to CIR once identified as stable  TOC --> OP Palliative follow up  Chaplain Consult  Symptom Management: Muscular Weakness:                 - PT                 - OT Dysphagia:                 - Speech therapy                 - Coretrack in place offering supplemental nutrition Xerostomia:                 - Good oral care QShift                  - Biotene Delirium:                 - Delirium precautions                 - Get up during the day                 - Encourage a familiar  face to remain present throughout the day                 - Keep blinds open and lights on during daylight hours                 - Minimize the use of opioids/benzodiazepines Spiritual:                 - Chaplain consult  Time Spent: 25 Greater than 50% of the time was spent in counseling and coordination of care ______________________________________________________________________________________ [place] Team Team Cell Phone: [phone] Please utilize secure chat with additional questions, if there is no response within 30 minutes please call the above phone number  Palliative Medicine Team providers are available by phone from 7am to 7pm daily and can be reached through the team cell phone.  Should this patient require assistance outside of these hours, please call the patient's attending physician.

## 2019-04-04 NOTE — Progress Notes (Signed)
Progress Note  Patient Name: Jonathan Berger Date of Encounter: 04/04/2019  Primary Cardiologist: Jonathan Heinz, MD   Subjective   Feeling well.  Denies palpitations.  Breathing much better  Inpatient Medications    Scheduled Meds: . amLODipine  5 mg Per Tube Daily  . aspirin  81 mg Per Tube Daily  . atorvastatin  40 mg Per Tube q1800  . chlorhexidine gluconate (MEDLINE KIT)  15 mL Mouth Rinse BID  . Chlorhexidine Gluconate Cloth  6 each Topical Daily  . donepezil  5 mg Per Tube QHS  . doxazosin  1 mg Per Tube Daily  . enoxaparin (LOVENOX) injection  40 mg Subcutaneous Q24H  . feeding supplement (OSMOLITE 1.5 CAL)  1,000 mL Per Tube Q24H  . feeding supplement (PRO-STAT SUGAR FREE 64)  30 mL Per Tube TID  . folic acid  1 mg Per Tube Daily  . free water  350 mL Per Tube Q4H  . mouth rinse  15 mL Mouth Rinse 10 times per day  . metoprolol tartrate  50 mg Per Tube BID  . pantoprazole sodium  40 mg Per Tube BID  . thiamine  100 mg Per Tube Daily  . ticagrelor  90 mg Oral BID   Or  . ticagrelor  90 mg Per Tube BID   Continuous Infusions: . sodium chloride    . sodium chloride Stopped (04/01/19 1509)  . ceFEPime (MAXIPIME) IV 2 g (04/03/19 2129)  . potassium chloride    . vancomycin 1,000 mg (04/03/19 1154)   PRN Meds: acetaminophen **OR** acetaminophen (TYLENOL) oral liquid 160 mg/5 mL **OR** acetaminophen, antiseptic oral rinse, bisacodyl, docusate sodium, metoprolol tartrate, nitroGLYCERIN, sodium chloride flush   Vital Signs    Vitals:   04/03/19 2108 04/03/19 2312 04/04/19 0440 04/04/19 0756  BP: (!) 147/73 (!) 154/86 (!) 155/83 (!) 147/75  Pulse: 96 86 99 (!) 101  Resp: 20 (!) 22 (!) 22 20  Temp: 98.2 F (36.8 C) (!) 97.5 F (36.4 C) (!) 97.3 F (36.3 C) 98.8 F (37.1 C)  TempSrc: Oral Oral Oral Oral  SpO2: 96% 94% 96% 98%  Weight:      Height:       No intake or output data in the 24 hours ending 04/04/19 0856 Last 3 Weights 04/02/2019  04/01/2019 03/31/2019  Weight (lbs) 186 lb 11.7 oz 182 lb 1.6 oz 179 lb 0.2 oz  Weight (kg) 84.7 kg 82.6 kg 81.2 kg      Telemetry    MAT.  PVCs.  NSVT - Personally Reviewed  ECG    04/03/19: Multifocal atrial tachycardia.  Rate 121 bpm.  PVCs. LAD.  Cannot rule out prior anterior infarct.  - Personally Reviewed  Physical Exam   VS:  BP (!) 147/75   Pulse (!) 101   Temp 98.8 F (37.1 C) (Oral)   Resp 20   Ht '5\' 11"'  (1.803 m)   Wt 84.7 kg   SpO2 98%   BMI 26.04 kg/m  , BMI Body mass index is 26.04 kg/m. GENERAL:  Chronically ill-appearing.  Frail. HEENT: Pupils equal round and reactive, fundi not visualized, oral mucosa dry NECK:  No jugular venous distention, waveform within normal limits, carotid upstroke brisk and symmetric, no bruits LUNGS:  Rhonchi R>L HEART:  Tachycardic.  Irregularly irregular.  PMI not displaced or sustained,S1 and S2 within normal limits, no S3, no S4, no clicks, no rubs, no murmurs ABD:  Flat, positive bowel sounds normal in frequency  in pitch, no bruits, no rebound, no guarding, no midline pulsatile mass, no hepatomegaly, no splenomegaly EXT:  2 plus pulses throughout, no edema, no cyanosis no clubbing SKIN:  No rashes no nodules NEURO:  Cranial nerves II through XII grossly intact, motor grossly intact throughout PSYCH:  Cognitively intact, oriented to person place and time   Labs    High Sensitivity Troponin:   Recent Labs  Lab 03/24/19 0950 03/29/19 0920 03/29/19 1149  TROPONINIHS 27* 30* 33*      Chemistry Recent Labs  Lab 04/02/19 0250 04/03/19 0439 04/04/19 0331  NA 148* 147* 144  K 3.0* 2.8* 2.8*  CL 119* 115* 112*  CO2 21* 23 22  GLUCOSE 164* 151* 148*  BUN 29* 19 19  CREATININE 1.04 0.97 1.03  CALCIUM 8.5* 8.4* 8.6*  GFRNONAA >60 >60 >60  GFRAA >60 >60 >60  ANIONGAP '8 9 10     ' Hematology Recent Labs  Lab 04/02/19 0250 04/03/19 0439 04/04/19 0331  WBC 12.2* 11.5* 11.0*  RBC 3.43* 3.62* 3.53*  HGB 11.2* 11.7*  11.4*  HCT 35.3* 36.0* 34.5*  MCV 102.9* 99.4 97.7  MCH 32.7 32.3 32.3  MCHC 31.7 32.5 33.0  RDW 14.3 13.9 13.8  PLT 181 236 253    BNPNo results for input(s): BNP, PROBNP in the last 168 hours.   DDimer  Recent Labs  Lab 03/29/19 0920  DDIMER 4.72*     Radiology    DG Chest Port 1 View  Result Date: 04/03/2019 CLINICAL DATA:  Respiratory failure.  Cough. EXAM: PORTABLE CHEST 1 VIEW COMPARISON:  04/02/2019.  04/01/2019. FINDINGS: Feeding tube noted with tip below left hemidiaphragm. Stable cardiomegaly. Persistent right upper and right lower infiltrates are again noted without interim change. Left base infiltrate unchanged. No pleural effusion or pneumothorax. IMPRESSION: 1.  Feeding tube in stable position. 2.  Stable cardiomegaly. 3. Persistent right upper and right lower infiltrates. Persistent left base infiltrate. No interim change. Electronically Signed   By: Jonathan Berger  Register   On: 04/03/2019 06:52    Cardiac Studies   Echo 03/23/19: IMPRESSIONS    1. Left ventricular ejection fraction, by estimation, is 45%. The left  ventricle has mildly decreased function. The left ventrical demonstrates  regional wall motion abnormalities (see scoring diagram/findings for  description). Akinesis of the mid to  apical anteroseptal and inferoseptal walls. Left ventricular diastolic  parameters are consistent with Grade I diastolic dysfunction (impaired  relaxation).  2. Right ventricular systolic function is mildly reduced. The right  ventricular size is mildly enlarged. There is mildly elevated pulmonary  artery systolic pressure. The estimated right ventricular systolic  pressure is 23.3 mmHg.  3. The aortic valve is tricuspid. Trivial aortic insufficiency, no aortic  stenosis.  4. No significant mitral regurgitation.  5. Dilated IVC with estimated RA pressure 15 mmHg.   Patient Profile     Jonathan Berger is an 31M with hypertension, dementia, and prior tobacco abuse  admitted with R ICA stenosis and stroke.   Assessment & Plan    # MAT:  Rate remains poorly controlled.  Increase metoprolol to 50 mg bid.  Jonathan Berger' rhythm is primarily multifocal atrial tachycardia.  There are some areas where it is difficult to find P waves.  However, even in these areas, MAT is clearly seen before and after these episodes.  I doubt that atrial fibrillation is present, though it is difficult to be certain.  The risks of triple therapy outweigh the benefit.  Would favor  continuing DAPT for his carotid stenting and CVA rather than adding anticoagulation.    # Stroke: Admitted 2/7 with acute R MCA stroke.  He underwent TPA and stenting of the R ICA.  Now on aspirin and ticagrelor.  # Hypertension:  Home valsartan/HCTZ on hold.  Increase metoprolol as above.  Continue amlodipine and doxazosin.  # Acute systolic and diastolic heart failure: LVEF 45% this admission.  No h/o CAD.  Plan to repeat echo as an outpatient and consider ischemic evaluation if LVEF remains depressed and he is clinically stable.  Consolidate metoprolol when able.     # Acute hypoxic respiratory failure: Intubated twice this hospitalization for pneumonia.  Extubated 2/17 and now stable, though he has rhonchi on exam today.      For questions or updates, please contact [place] Please consult [email or web address] for contact info under        Signed, Skeet Latch, MD  04/04/2019, 8:56 AM

## 2019-04-05 LAB — CBC WITH DIFFERENTIAL/PLATELET
Abs Immature Granulocytes: 0.06 10*3/uL (ref 0.00–0.07)
Basophils Absolute: 0 10*3/uL (ref 0.0–0.1)
Basophils Relative: 0 %
Eosinophils Absolute: 0.2 10*3/uL (ref 0.0–0.5)
Eosinophils Relative: 2 %
HCT: 34.3 % — ABNORMAL LOW (ref 39.0–52.0)
Hemoglobin: 10.9 g/dL — ABNORMAL LOW (ref 13.0–17.0)
Immature Granulocytes: 1 %
Lymphocytes Relative: 12 %
Lymphs Abs: 1 10*3/uL (ref 0.7–4.0)
MCH: 32.2 pg (ref 26.0–34.0)
MCHC: 31.8 g/dL (ref 30.0–36.0)
MCV: 101.5 fL — ABNORMAL HIGH (ref 80.0–100.0)
Monocytes Absolute: 0.6 10*3/uL (ref 0.1–1.0)
Monocytes Relative: 7 %
Neutro Abs: 6.5 10*3/uL (ref 1.7–7.7)
Neutrophils Relative %: 78 %
Platelets: 286 10*3/uL (ref 150–400)
RBC: 3.38 MIL/uL — ABNORMAL LOW (ref 4.22–5.81)
RDW: 14.2 % (ref 11.5–15.5)
WBC: 8.3 10*3/uL (ref 4.0–10.5)
nRBC: 0 % (ref 0.0–0.2)

## 2019-04-05 LAB — BASIC METABOLIC PANEL
Anion gap: 9 (ref 5–15)
BUN: 22 mg/dL (ref 8–23)
CO2: 23 mmol/L (ref 22–32)
Calcium: 8.9 mg/dL (ref 8.9–10.3)
Chloride: 110 mmol/L (ref 98–111)
Creatinine, Ser: 1.13 mg/dL (ref 0.61–1.24)
GFR calc Af Amer: >60 mL/min (ref >60)
GFR calc non Af Amer: 59 mL/min — ABNORMAL LOW (ref >60)
Glucose, Bld: 135 mg/dL — ABNORMAL HIGH (ref 70–99)
Potassium: 3.4 mmol/L — ABNORMAL LOW (ref 3.5–5.1)
Sodium: 142 mmol/L (ref 135–145)

## 2019-04-05 LAB — GLUCOSE, CAPILLARY
Glucose-Capillary: 125 mg/dL — ABNORMAL HIGH (ref 70–99)
Glucose-Capillary: 126 mg/dL — ABNORMAL HIGH (ref 70–99)
Glucose-Capillary: 121 mg/dL — ABNORMAL HIGH (ref 70–99)
Glucose-Capillary: 144 mg/dL — ABNORMAL HIGH (ref 70–99)
Glucose-Capillary: 138 mg/dL — ABNORMAL HIGH (ref 70–99)

## 2019-04-05 LAB — CULTURE, RESPIRATORY W GRAM STAIN

## 2019-04-05 LAB — PHOSPHORUS: Phosphorus: 2.5 mg/dL (ref 2.5–4.6)

## 2019-04-05 MED ORDER — LORAZEPAM 2 MG/ML IJ SOLN
1.0000 mg | Freq: Four times a day (QID) | INTRAMUSCULAR | Status: DC | PRN
  Administered 2019-04-05 – 2019-04-13 (×10): 1 mg via INTRAVENOUS
  Filled 2019-04-05 (×11): qty 1

## 2019-04-05 MED ORDER — FUROSEMIDE 10 MG/ML IJ SOLN
40.0000 mg | Freq: Once | INTRAMUSCULAR | Status: AC
  Administered 2019-04-06: 40 mg via INTRAVENOUS
  Filled 2019-04-05: qty 4

## 2019-04-05 MED ORDER — QUETIAPINE FUMARATE 25 MG PO TABS
25.0000 mg | ORAL_TABLET | Freq: Every day | ORAL | Status: DC
  Administered 2019-04-05 – 2019-04-14 (×10): 25 mg via ORAL
  Filled 2019-04-05 (×10): qty 1

## 2019-04-05 MED ORDER — POTASSIUM CHLORIDE 20 MEQ/15ML (10%) PO SOLN
40.0000 meq | Freq: Once | ORAL | Status: AC
  Administered 2019-04-05: 40 meq
  Filled 2019-04-05: qty 30

## 2019-04-05 NOTE — Progress Notes (Signed)
Palliative Medicine Inpatient Follow Up Note  HPI: Per hospitalist note --> 38 yr oldmale with history of hypertension, who was admitted by Dr. [name] on 04/04/2019 for dense right ischemic MCA CVA, s/p TPA and VIR intervention with balloon angioplasty and stent placement at proximal R ICA.Ptdeveloped right lower lobe aspiration pneumonitis after extubation on 2/11/2021and was later re-intubated on 2/15 and extubated on 2/17 . Ptwas alsofound to have new systolic CHF with EF 35%.Pt had been managed in the ICU and transferred to the floor, [facility] assumed care on 04/03/19.  Today's Discussion (04/05/2019): Chart reviewed. Met with Jonathan Berger, he remains in a similar state to previous days. He is quite disoriented and remains to have a 1:1 sitter.    Got a call from Jonathan Berger's daughter, Jonathan Berger regarding her concerns she said that her mother is overwhelmed and Jonathan Berger's health ailments are having a toll on her. Jonathan Berger is concerned that Jonathan Berger may start to rapidly decline if she continues to come in daily and asked if she may come in in replacement of her. I got a copy of the patients directives and have placed them on the chart. Jonathan Berger is one of the [employer].   Per Jonathan Berger patient is use to being up and on the go regularly, Jonathan Berger feels that keeping him in the bed is causing him more problems. She shared that her father had struggled with memory impairment for quite sometime.   Jonathan Berger and I talked about the options of [place] improving versus him worsening. Jonathan Berger said that she feels more likely than not he will not improve and we will then need to discuss a more comfort oriented path. Although she is hopeful for improvement she is immensely realistic two weeks in that things may not get any better.  All questions answered.   Vital Signs Vitals:   04/05/19 0821 04/05/19 0824  BP: 128/88 128/88  Pulse:  68  Resp:  15  Temp:  97.9 F (36.6 C)  SpO2:  98%    Intake/Output Summary (Last 24 hours) at  04/05/2019 1308 Last data filed at 04/05/2019 1214 Gross per 24 hour  Intake 2328 ml  Output 1300 ml  Net 1028 ml   Last Weight  Most recent update: 04/02/2019  6:29 AM   Weight  84.7 kg (186 lb 11.7 oz)           Physical Exam Vitals and nursing note reviewed.  HENT:     Head: Normocephalic.     Nose:     Comments: Coretrack in place    Mouth/Throat:     Mouth: Mucous membranes are dry.  Eyes:     Pupils: Pupils are equal, round, and reactive to light.  Cardiovascular:     Rate and Rhythm: Tachycardia present. Rhythm irregular.  Musculoskeletal:     Cervical back: Normal range of motion.  Skin:    General: Skin is warm.  Neurological:     Mental Status: He is alert. He is disoriented.  Psychiatric:         Judgment: Judgment is impulsive.   SUMMARY OF RECOMMENDATIONS   DNR/DNI  Plan to transition to CIR once identified as stable, though this may be based upon his ability to follow direction. If he is not improving we will need to talk more about a comfort oriented path.   TOC --> OP Palliative follow up  Chaplain Consult  Symptom Management: Muscular Weakness:                 -  PT                 - OT Dysphagia:                 - Speech therapy                 - Coretrack in place offering supplemental nutrition Xerostomia:                 - Good oral care QShift                  - Biotene Delirium:                 - Delirium precautions                 - Get up during the day                 - Encourage a familiar face to remain present throughout the day                 - Keep blinds open and lights on during daylight hours                 - Minimize the use of opioids/benzodiazepines Spiritual:                 - Chaplain consult  Time Spent: 25 Greater than 50% of the time was spent in counseling and coordination of care ______________________________________________________________________________________ [place] Team Team Cell Phone: [phone] Please utilize secure chat with additional questions, if there is no response within 30 minutes please call the above phone number  Palliative Medicine Team providers are available by phone from 7am to 7pm daily and can be reached through the team cell phone.  Should this patient require assistance outside of these hours, please call the patient's attending physician.

## 2019-04-05 NOTE — Plan of Care (Signed)
Patient remains confused, agitated, and combative at times. Unable to verbalize concerns, unable to educate.  Problem: Education: Goal: Knowledge of disease or condition will improve Outcome: Not Progressing Goal: Knowledge of secondary prevention will improve Outcome: Not Progressing Goal: Knowledge of patient specific risk factors addressed and post discharge goals established will improve Outcome: Not Progressing Goal: Individualized Educational Video(s) Outcome: Not Progressing   Problem: Coping: Goal: Will verbalize positive feelings about self Outcome: Not Progressing Goal: Will identify appropriate support needs Outcome: Not Progressing   Problem: Health Behavior/Discharge Planning: Goal: Ability to manage health-related needs will improve Outcome: Not Progressing   Problem: Self-Care: Goal: Verbalization of feelings and concerns over difficulty with self-care will improve Outcome: Not Progressing Goal: Ability to communicate needs accurately will improve Outcome: Not Progressing   Problem: Nutrition: Goal: Risk of aspiration will decrease Outcome: Not Progressing Goal: Dietary intake will improve Outcome: Not Progressing   Problem: Ischemic Stroke/TIA Tissue Perfusion: Goal: Complications of ischemic stroke/TIA will be minimized Outcome: Not Progressing   Problem: Education: Goal: Knowledge of General Education information will improve Description: Including pain rating scale, medication(s)/side effects and non-pharmacologic comfort measures Outcome: Not Progressing   Problem: Health Behavior/Discharge Planning: Goal: Ability to manage health-related needs will improve Outcome: Not Progressing   Problem: Clinical Measurements: Goal: Ability to maintain clinical measurements within normal limits will improve Outcome: Not Progressing Goal: Will remain free from infection Outcome: Not Progressing Goal: Diagnostic test results will improve Outcome: Not  Progressing Goal: Respiratory complications will improve Outcome: Not Progressing Goal: Cardiovascular complication will be avoided Outcome: Not Progressing   Problem: Activity: Goal: Risk for activity intolerance will decrease Outcome: Not Progressing   Problem: Nutrition: Goal: Adequate nutrition will be maintained Outcome: Not Progressing   Problem: Coping: Goal: Level of anxiety will decrease Outcome: Not Progressing   Problem: Elimination: Goal: Will not experience complications related to bowel motility Outcome: Not Progressing Goal: Will not experience complications related to urinary retention Outcome: Not Progressing   Problem: Skin Integrity: Goal: Risk for impaired skin integrity will decrease Outcome: Not Progressing

## 2019-04-05 NOTE — Progress Notes (Signed)
PROGRESS NOTE  Jonathan Berger LKJ:[id number] DOB: 05/31/1931 DOA: 03/19/2019 PCP: [name], MD  HPI/Recap of past 24 hours: HPI from Dr [name] 86 yr old male with history of hypertension, who was admitted by Dr. [name] on 03/27/2019 for dense right ischemic MCA CVA, s/p TPA and VIR intervention with balloon angioplasty and stent placement at proximal R ICA. Pt developed right lower lobe aspiration pneumonitis after extubation on 03/26/2019 and was later re-intubated on 2/15 and extubated on 2/17 . Pt was also found to have new systolic CHF with EF 79%. Pt had been managed in the ICU and transferred to the floor, TRH assumed care on 04/03/19.    Today, still noted to be restless, intermittent agitation/aggression towards staff, unable to follow commands, disoriented, requiring one-to-one sitter.    Assessment/Plan: Principal Problem:   Middle cerebral artery embolism, right Active Problems:   Acute on chronic respiratory failure with hypoxia (HCC)   Systolic dysfunction   Dehydration   Aspiration pneumonia of right lower lobe due to gastric secretions (HCC)   Tachycardia   Hypoxia   Paroxysmal atrial fibrillation (HCC)   Multifocal atrial tachycardia (HCC)   Palliative care by specialist   Goals of care, counseling/discussion   DNR (do not resuscitate)   Muscular weakness   Dysphagia   Xerostomia   Delirium   Acute hypoxemic respiratory failure likely 2/2 recent CVA/Aspiration PNA/acute CHF Status post intubation x2, recently extubated on 04/01/2019 Currently on room air Currently afebrile, with resolved leukocytosis BC x2 NGTD Tracheal aspirate culture growing staph epidermidis Repeat chest x-ray showed pulmonary vascular congestion S/P 1 dose of IV Lasix on 04/04/19, decreased free water to 380ms Q6H Duo nebs scheduled, as needed Supplemental oxygen as needed Completed cefepime and vancomycin X 7 days  Hypokalemia/hypophosphatemia Replace as needed  Right MCA  infarct status post tPA and IR with L ICA stent placement MRI brain with right temporal lobe cortical/subcortical infarct with numerous small scattered R brain infarcts CTA head and neck showed right MCA LVO mid and distal segment.;  Right ICA critical stenosis, left ICA distal bulb string sign, severe B VA origin stenosis Neurology on board Echo showed EF 448% grade 1 diastolic dysfunction Carotid Doppler showed left ICA 60 to 79% stenosis LDL 80, A1c 5.9 Continue Brilinta, aspirin, Lipitor PT/OT/SLP Plan for further left ICA intervention in the future  Acute systolic and diastolic HF/MAT, ??A. fib Echo showed EF of 401% grade 1 diastolic dysfunction No prior history of known CAD Cardiology on board, plan to repeat echo as an outpatient and consider ischemic evaluation if EF remains depressed Cardiology on board, increased metoprolol for rate control, no need for [facility] unlikely A. Fib (although hard to determine, also risk of triple therapy outweigh benefit) Due to repeat chest x-ray as above, s/p 1 dose of Lasix on 04/04/19  Hypertension Continue amlodipine, valsartan-hydrochlorothiazide on hold  Dysphagia/generalized deconditioning N.p.o. Currently on tube feeds via cortrak SLP, dietitian on board Plan for CIR  ??Diarrhea Multiple bowel movements noted Ruled out C. difficile  Insert rectal tube  Dementia Currently with delirium, likely ICU delirium Start Seroquel, continue Aricept, restraints  GCarlislePatient with very poor prognosis Palliative consulted for further goals of care         Malnutrition Type:  Nutrition Problem: Inadequate oral intake Etiology: inability to eat   Malnutrition Characteristics:  Signs/Symptoms: NPO status   Nutrition Interventions:  Interventions: MVI, Prostat, Tube feeding    Estimated body mass index is 26.04 kg/m as  calculated from the following:   Height as of this encounter: '5\' 11"'  (1.803 m).   Weight as of this encounter:  84.7 kg.       Code Status: DNR  Family Communication: Discussed with wife on 04/05/19  Disposition Plan: Likely CIR, once significant clinical improvement   Consultants:  PCCM  Neurology  Cardiology  Palliative  Procedures: tPA and IR with L ICA stent placement  Antimicrobials:  Completed Cefepime and Vancomycin  DVT prophylaxis: Lovenox   Objective: Vitals:   04/05/19 0505 04/05/19 0809 04/05/19 0821 04/05/19 0824  BP: (!) 144/104  128/88 128/88  Pulse: 94   68  Resp: 20   15  Temp: 98.5 F (36.9 C)   97.9 F (36.6 C)  TempSrc: Oral   Axillary  SpO2: 99% 99%  98%  Weight:      Height:        Intake/Output Summary (Last 24 hours) at 04/05/2019 1125 Last data filed at 04/05/2019 0093 Gross per 24 hour  Intake 2250 ml  Output 1300 ml  Net 950 ml   Filed Weights   03/31/19 0500 04/01/19 0500 04/02/19 0500  Weight: 81.2 kg 82.6 kg 84.7 kg    Exam:  General: NAD, restless, on restraints, intermittent agitation  Cardiovascular: S1, S2 present  Respiratory:  Mild bibasilar crackles noted  Abdomen: Soft, nontender, nondistended, bowel sounds present  Musculoskeletal: No bilateral pedal edema noted  Skin: Normal  Psychiatry: Unable to assess  Neurology: Unable to follow commands, but moving all extremities spontaneously     Data Reviewed: CBC: Recent Labs  Lab 04/01/19 0423 04/02/19 0250 04/03/19 0439 04/04/19 0331 04/05/19 0735  WBC 8.9 12.2* 11.5* 11.0* 8.3  NEUTROABS 7.1  --  9.9* 8.9* 6.5  HGB 10.7* 11.2* 11.7* 11.4* 10.9*  HCT 33.9* 35.3* 36.0* 34.5* 34.3*  MCV 103.0* 102.9* 99.4 97.7 101.5*  PLT 181 181 236 253 818   Basic Metabolic Panel: Recent Labs  Lab 04/01/19 0423 04/02/19 0250 04/03/19 0439 04/04/19 0331 04/05/19 0735  NA 149* 148* 147* 144 142  K 3.6 3.0* 2.8* 2.8* 3.4*  CL 123* 119* 115* 112* 110  CO2 21* 21* '23 22 23  ' GLUCOSE 183* 164* 151* 148* 135*  BUN 34* 29* '19 19 22  ' CREATININE 1.22 1.04 0.97 1.03  1.13  CALCIUM 8.6* 8.5* 8.4* 8.6* 8.9  MG 2.2 2.1 2.1  --   --   PHOS 2.1* 1.7* 2.3*  --  2.5   GFR: Estimated Creatinine Clearance: 50 mL/min (by C-G formula based on SCr of 1.13 mg/dL). Liver Function Tests: No results for input(s): AST, ALT, ALKPHOS, BILITOT, PROT, ALBUMIN in the last 168 hours. No results for input(s): LIPASE, AMYLASE in the last 168 hours. No results for input(s): AMMONIA in the last 168 hours. Coagulation Profile: No results for input(s): INR, PROTIME in the last 168 hours. Cardiac Enzymes: No results for input(s): CKTOTAL, CKMB, CKMBINDEX, TROPONINI in the last 168 hours. BNP (last 3 results) No results for input(s): PROBNP in the last 8760 hours. HbA1C: No results for input(s): HGBA1C in the last 72 hours. CBG: Recent Labs  Lab 04/04/19 1800 04/04/19 1919 04/04/19 2317 04/05/19 0504 04/05/19 0813  GLUCAP 95 107* 111* 125* 126*   Lipid Profile: No results for input(s): CHOL, HDL, LDLCALC, TRIG, CHOLHDL, LDLDIRECT in the last 72 hours. Thyroid Function Tests: No results for input(s): TSH, T4TOTAL, FREET4, T3FREE, THYROIDAB in the last 72 hours. Anemia Panel: No results for input(s): VITAMINB12, FOLATE, FERRITIN,  TIBC, IRON, RETICCTPCT in the last 72 hours. Urine analysis:    Component Value Date/Time   COLORURINE YELLOW 03/23/2019 1202   APPEARANCEUR CLEAR 03/23/2019 1202   LABSPEC 1.015 03/23/2019 1202   PHURINE 6.0 03/23/2019 1202   GLUCOSEU NEGATIVE 03/23/2019 1202   HGBUR LARGE (A) 03/23/2019 1202   BILIRUBINUR NEGATIVE 03/23/2019 1202   KETONESUR NEGATIVE 03/23/2019 1202   PROTEINUR NEGATIVE 03/23/2019 1202   UROBILINOGEN 0.2 05/07/2011 1128   NITRITE NEGATIVE 03/23/2019 1202   LEUKOCYTESUR TRACE (A) 03/23/2019 1202   Sepsis Labs: '@LABRCNTIP' (procalcitonin:4,lacticidven:4)  ) Recent Results (from the past 240 hour(s))  Culture, blood (x 2)     Status: None   Collection Time: 03/29/19 11:49 AM   Specimen: BLOOD  Result Value Ref  Range Status   Specimen Description BLOOD RIGHT ANTECUBITAL  Final   Special Requests   Final    BOTTLES DRAWN AEROBIC ONLY Blood Culture adequate volume   Culture   Final    NO GROWTH 5 DAYS Performed at [facility], [place] [address] [place], [place] [address]    Report Status 04/03/2019 FINAL  Final  Culture, blood (x 2)     Status: None   Collection Time: 03/29/19 11:55 AM   Specimen: BLOOD RIGHT FOREARM  Result Value Ref Range Status   Specimen Description BLOOD RIGHT FOREARM  Final   Special Requests   Final    BOTTLES DRAWN AEROBIC ONLY Blood Culture adequate volume   Culture   Final    NO GROWTH 5 DAYS Performed at [facility], [place] [address], [place], [place] [address]    Report Status 04/03/2019 FINAL  Final  Culture, respiratory (non-expectorated)     Status: None   Collection Time: 04/01/19  9:15 AM   Specimen: Tracheal Aspirate; Respiratory  Result Value Ref Range Status   Specimen Description TRACHEAL ASPIRATE  Final   Special Requests NONE  Final   Gram Stain   Final    FEW WBC PRESENT,BOTH PMN AND MONONUCLEAR RARE YEAST RARE GRAM POSITIVE COCCI IN CLUSTERS Performed at [facility], [place] [address], [place], [place] [address]    Culture   Final    FEW STAPHYLOCOCCUS EPIDERMIDIS FEW CANDIDA ALBICANS    Report Status 04/05/2019 FINAL  Final   Organism ID, Bacteria STAPHYLOCOCCUS EPIDERMIDIS  Final      Susceptibility   Staphylococcus epidermidis - MIC*    CIPROFLOXACIN <=0.5 SENSITIVE Sensitive     ERYTHROMYCIN >=8 RESISTANT Resistant     GENTAMICIN <=0.5 SENSITIVE Sensitive     OXACILLIN >=4 RESISTANT Resistant     TETRACYCLINE 2 SENSITIVE Sensitive     VANCOMYCIN 2 SENSITIVE Sensitive     TRIMETH/SULFA 160 RESISTANT Resistant     CLINDAMYCIN <=0.25 SENSITIVE Sensitive     RIFAMPIN <=0.5 SENSITIVE Sensitive     Inducible Clindamycin NEGATIVE Sensitive     * FEW STAPHYLOCOCCUS EPIDERMIDIS  C difficile quick scan w PCR reflex      Status: None   Collection Time: 04/04/19  1:21 PM   Specimen: STOOL  Result Value Ref Range Status   C Diff antigen NEGATIVE NEGATIVE Final   C Diff toxin NEGATIVE NEGATIVE Final   C Diff interpretation No C. difficile detected.  Final    Comment: Performed at [facility], [place] [address], [place], [place] [address]      Studies: DG Chest Port 1 View  Result Date: 04/04/2019 CLINICAL DATA:  Wheezing EXAM: PORTABLE  CHEST 1 VIEW COMPARISON:  Chest radiograph 04/03/2019 FINDINGS: Enteric tube courses below the field of view. Moderate cardiomegaly and central pulmonary vascular congestion. No pleural effusion or pneumothorax. No focal consolidation. IMPRESSION: Cardiomegaly and central pulmonary vascular congestion. Electronically Signed   By: Ulyses Jarred M.D.   On: 04/04/2019 15:13    Scheduled Meds: . amLODipine  5 mg Per Tube Daily  . aspirin  81 mg Per Tube Daily  . atorvastatin  40 mg Per Tube q1800  . chlorhexidine gluconate (MEDLINE KIT)  15 mL Mouth Rinse BID  . Chlorhexidine Gluconate Cloth  6 each Topical Daily  . donepezil  5 mg Per Tube QHS  . doxazosin  1 mg Per Tube Daily  . enoxaparin (LOVENOX) injection  40 mg Subcutaneous Q24H  . feeding supplement (OSMOLITE 1.5 CAL)  1,000 mL Per Tube Q24H  . feeding supplement (PRO-STAT SUGAR FREE 64)  30 mL Per Tube TID  . folic acid  1 mg Per Tube Daily  . free water  300 mL Per Tube Q6H  . Gerhardt's butt cream   Topical BID  . levalbuterol  0.63 mg Nebulization TID  . mouth rinse  15 mL Mouth Rinse 10 times per day  . metoprolol tartrate  50 mg Per Tube BID  . pantoprazole sodium  40 mg Per Tube BID  . QUEtiapine  25 mg Oral QHS  . thiamine  100 mg Per Tube Daily  . ticagrelor  90 mg Oral BID   Or  . ticagrelor  90 mg Per Tube BID    Continuous Infusions: . sodium chloride    . sodium chloride Stopped (04/01/19 1509)     LOS: 14 days     Alma Friendly, MD [facility]  If 7PM-7AM, please  contact night-coverage [email or web address] 04/05/2019, 11:25 AM

## 2019-04-05 NOTE — Progress Notes (Signed)
   04/05/19 1215  Family/Significant Other Communication  Family/Significant Other Update Updated;Visiting (Wife)

## 2019-04-06 ENCOUNTER — Inpatient Hospital Stay (HOSPITAL_COMMUNITY): Payer: Medicare Other

## 2019-04-06 LAB — CBC WITH DIFFERENTIAL/PLATELET
Abs Immature Granulocytes: 0.06 10*3/uL (ref 0.00–0.07)
Basophils Absolute: 0 10*3/uL (ref 0.0–0.1)
Basophils Relative: 1 %
Eosinophils Absolute: 0.2 10*3/uL (ref 0.0–0.5)
Eosinophils Relative: 2 %
HCT: 34.6 % — ABNORMAL LOW (ref 39.0–52.0)
Hemoglobin: 11.2 g/dL — ABNORMAL LOW (ref 13.0–17.0)
Immature Granulocytes: 1 %
Lymphocytes Relative: 19 %
Lymphs Abs: 1.5 10*3/uL (ref 0.7–4.0)
MCH: 32.5 pg (ref 26.0–34.0)
MCHC: 32.4 g/dL (ref 30.0–36.0)
MCV: 100.3 fL — ABNORMAL HIGH (ref 80.0–100.0)
Monocytes Absolute: 0.7 10*3/uL (ref 0.1–1.0)
Monocytes Relative: 8 %
Neutro Abs: 5.6 10*3/uL (ref 1.7–7.7)
Neutrophils Relative %: 69 %
Platelets: 313 10*3/uL (ref 150–400)
RBC: 3.45 MIL/uL — ABNORMAL LOW (ref 4.22–5.81)
RDW: 14.6 % (ref 11.5–15.5)
WBC: 8.1 10*3/uL (ref 4.0–10.5)
nRBC: 0 % (ref 0.0–0.2)

## 2019-04-06 LAB — GLUCOSE, CAPILLARY
Glucose-Capillary: 105 mg/dL — ABNORMAL HIGH (ref 70–99)
Glucose-Capillary: 109 mg/dL — ABNORMAL HIGH (ref 70–99)
Glucose-Capillary: 130 mg/dL — ABNORMAL HIGH (ref 70–99)
Glucose-Capillary: 132 mg/dL — ABNORMAL HIGH (ref 70–99)
Glucose-Capillary: 139 mg/dL — ABNORMAL HIGH (ref 70–99)
Glucose-Capillary: 152 mg/dL — ABNORMAL HIGH (ref 70–99)

## 2019-04-06 LAB — BASIC METABOLIC PANEL
Anion gap: 13 (ref 5–15)
BUN: 25 mg/dL — ABNORMAL HIGH (ref 8–23)
CO2: 23 mmol/L (ref 22–32)
Calcium: 9.3 mg/dL (ref 8.9–10.3)
Chloride: 109 mmol/L (ref 98–111)
Creatinine, Ser: 1.16 mg/dL (ref 0.61–1.24)
GFR calc Af Amer: >60 mL/min (ref >60)
GFR calc non Af Amer: 57 mL/min — ABNORMAL LOW (ref >60)
Glucose, Bld: 132 mg/dL — ABNORMAL HIGH (ref 70–99)
Potassium: 3.4 mmol/L — ABNORMAL LOW (ref 3.5–5.1)
Sodium: 145 mmol/L (ref 135–145)

## 2019-04-06 MED ORDER — METOPROLOL TARTRATE 25 MG/10 ML ORAL SUSPENSION
75.0000 mg | Freq: Two times a day (BID) | ORAL | Status: DC
  Administered 2019-04-06 – 2019-04-16 (×21): 75 mg
  Filled 2019-04-06 (×22): qty 30

## 2019-04-06 MED ORDER — POTASSIUM CHLORIDE 20 MEQ PO PACK: 60.0000 meq | PACK | Freq: Once | ORAL | Status: DC

## 2019-04-06 MED ORDER — LORAZEPAM 2 MG/ML IJ SOLN
1.0000 mg | Freq: Once | INTRAMUSCULAR | Status: AC
  Administered 2019-04-06: 1 mg via INTRAVENOUS

## 2019-04-06 MED ORDER — LOPERAMIDE HCL 2 MG PO CAPS: 2.0000 mg | ORAL_CAPSULE | Freq: Two times a day (BID) | ORAL | Status: DC | PRN

## 2019-04-06 MED ORDER — JEVITY 1.5 CAL/FIBER PO LIQD
1000.0000 mL | ORAL | Status: DC
  Administered 2019-04-06 – 2019-04-12 (×3): 1000 mL via ORAL
  Filled 2019-04-06 (×15): qty 1000

## 2019-04-06 MED ORDER — METOPROLOL TARTRATE 25 MG/10 ML ORAL SUSPENSION
25.0000 mg | Freq: Once | ORAL | Status: AC
  Administered 2019-04-06: 25 mg
  Filled 2019-04-06: qty 10

## 2019-04-06 MED ORDER — POTASSIUM CHLORIDE 20 MEQ/15ML (10%) PO SOLN
40.0000 meq | Freq: Once | ORAL | Status: AC
  Administered 2019-04-06: 40 meq
  Filled 2019-04-06: qty 30

## 2019-04-06 MED ORDER — FREE WATER
200.0000 mL | Freq: Three times a day (TID) | Status: DC
  Administered 2019-04-06 – 2019-04-07 (×2): 200 mL

## 2019-04-06 NOTE — Progress Notes (Signed)
Patient received a rectal tube placement per MD order. Patient has excreted the rectal tubing x 3. Noted slight rectal bleeding from internal hemorrhoids.  Patient has not had a recent bowel movement. MD made aware and awaiting additional orders.

## 2019-04-06 NOTE — Progress Notes (Signed)
Nutrition Follow-up  DOCUMENTATION CODES:   Not applicable  INTERVENTION:  Change tube feeding formula to fiber containing formula:  -Jevity 1.5 @ 50 ml/hr (1200 ml/day) with 30 ml Pro-stat TID via tube    Provides 2100 kcal, 121 grams of protein, and 912 ml free water (2112 ml total water with flushes)   300 ml free water flush every 6 hrs per MD  NUTRITION DIAGNOSIS:   Inadequate oral intake related to inability to eat as evidenced by NPO status.  Ongoing.  GOAL:   Patient will meet greater than or equal to 90% of their needs  Met with TF.   MONITOR:   Labs, Weight trends, I & O's, Vent status, TF tolerance, Skin, Diet advancement  REASON FOR ASSESSMENT:   Consult Enteral/tube feeding initiation and management  ASSESSMENT:   86 year old male with past medical history of HTN , anemia, and chronic back pain presented to ED with slurred speech and left facial droop. CTA showed right ICA near occulusion with distal M1 thrombus extending into M2 branch.  Pt with new onset heart failure likely related to critical illness.  2/7 IR - R MCA embolization and revascularization 2/8 TEE - Left EF ~45% 2/9 CXR - new RLL infiltrate vs atelectasis 2/11ptextubated 2/12 Cortrak - tube just inside pylorus 2/14 RRT called d/t increased respirations, decreased mental status 2/15 pt re-intubated for respiratory insufficiency  2/17 pt extubated  Per palliative care NP, considering CIR vs comfort care.  RD consulted due to pt having recurrent diarrhea. C. Diff negative. Rectal tube inserted 2/17 and removed 2/21.   Medications reviewed and include: folvite,  Thiamine  Labs reviewed: K+ 3.4 (L) CBGs 105-139   Diet Order:   Diet Order            Diet NPO time specified  Diet effective now              EDUCATION NEEDS:   No education needs have been identified at this time  Skin:  Skin Assessment: Reviewed RN Assessment  Last BM:  2/22 type 7  Height:   Ht  Readings from Last 1 Encounters:  03/30/2019 '5\' 11"'  (1.803 m)    Weight:   Wt Readings from Last 1 Encounters:  04/02/19 84.7 kg    Ideal Body Weight:  78.2 kg  BMI:  Body mass index is 26.04 kg/m.  Estimated Nutritional Needs:   Kcal:  1983  Protein:  110-125  Fluid:  >/= 1.9 L/day    Larkin Ina, MS, RD, LDN RD pager number and weekend/on-call pager number located in [place].

## 2019-04-06 NOTE — Progress Notes (Signed)
Physical Therapy Treatment Patient Details Name: Jonathan Berger MRN: [id number] DOB: 06/09/1931 Today's Date: 04/06/2019    History of Present Illness 86 year old presenting 2/7 with slurred speech and dysphagia found to have acute right middle cerebral artery stroke s/p tPA and EVR R MCA and ICA with stent on 04/03/2019.  Extubated 2/11, reintubated 2/15-2/17.    PT Comments    Patient received in bed, sitter present, patient is anxious, impulsive, alert. Patient requires mod assist to get from supine >< sit. Sit to stand with min +2 assist for safety/equipment. Patient ambulated 6 feet then 10 feet more with rw and min assist +2.  Patient is disoriented, has difficulty following direction consistently, decreased safety awareness. Continuously trying to get up, therefore returned to bed with restraints reapplied. Sitter present. Patient will continue to benefit from skilled PT to improve functional mobility and safety with mobility.        Follow Up Recommendations  SNF;Supervision/Assistance - 24 hour     Equipment Recommendations  Other (comment)(to be determined)    Recommendations for Other Services       Precautions / Restrictions Precautions Precautions: Fall Precaution Comments: Cortrak, restraints, mitts, sitter Restrictions Weight Bearing Restrictions: No    Mobility  Bed Mobility Overal bed mobility: Needs Assistance Bed Mobility: Supine to Sit;Sit to Supine     Supine to sit: Mod assist;+2 for physical assistance;+2 for safety/equipment;HOB elevated Sit to supine: Mod assist;+2 for physical assistance;+2 for safety/equipment;HOB elevated   General bed mobility comments: ModA + 2 for trunk and LE management  Transfers Overall transfer level: Needs assistance Equipment used: Rolling walker (2 wheeled) Transfers: Sit to/from Stand Sit to Stand: Min assist;+2 physical assistance;+2 safety/equipment         General transfer comment: patient impulsive, trying to  get up, thinks he is going home. Wants to go sit outside on the porch. Gets easily fatigued and wants to sit without warning.  Ambulation/Gait Ambulation/Gait assistance: Mod assist;+2 safety/equipment Gait Distance (Feet): 15 Feet Assistive device: Rolling walker (2 wheeled) Gait Pattern/deviations: Step-to pattern Gait velocity: decr   General Gait Details: ModA + 2 for stability, safety, walker proximity helps to have a chair nearby   Stairs             Wheelchair Mobility    Modified Rankin (Stroke Patients Only) Modified Rankin (Stroke Patients Only) Pre-Morbid Rankin Score: No symptoms Modified Rankin: Moderately severe disability     Balance Overall balance assessment: Needs assistance Sitting-balance support: Bilateral upper extremity supported;Feet supported Sitting balance-Leahy Scale: Fair Sitting balance - Comments: reliant on UE support   Standing balance support: Bilateral upper extremity supported;During functional activity Standing balance-Leahy Scale: Fair Standing balance comment: reliant on external support                            Cognition Arousal/Alertness: Awake/alert Behavior During Therapy: Impulsive;Restless Overall Cognitive Status: Impaired/Different from baseline Area of Impairment: Orientation;Attention;Memory;Following commands;Safety/judgement;Awareness;Problem solving                 Orientation Level: Disoriented to;Time;Situation;Place Current Attention Level: Sustained   Following Commands: Follows one step commands with increased time;Follows one step commands inconsistently Safety/Judgement: Decreased awareness of safety;Decreased awareness of deficits Awareness: Intellectual Problem Solving: Slow processing;Requires verbal cues;Requires tactile cues;Difficulty sequencing General Comments: Pt requiring simple direct cues. Responding and attending to his name. Decreased awareness and problem solving.  Agreeable to therapy and motivated to partcipate.  Exercises      General Comments        Pertinent Vitals/Pain Pain Assessment: Faces Pain Score: 4  Faces Pain Scale: Hurts little more Pain Location: LEs with knee extension Pain Descriptors / Indicators: Grimacing;Moaning;Guarding Pain Intervention(s): Monitored during session;Limited activity within patient's tolerance;Repositioned    Home Living                      Prior Function            PT Goals (current goals can now be found in the care plan section) Acute Rehab PT Goals Patient Stated Goal: none stated Time For Goal Achievement: 04/20/19 Potential to Achieve Goals: Fair Progress towards PT goals: Progressing toward goals    Frequency    Min 4X/week      PT Plan Current plan remains appropriate    Co-evaluation              AM-PAC PT "6 Clicks" Mobility   Outcome Measure  Help needed turning from your back to your side while in a flat bed without using bedrails?: A Little Help needed moving from lying on your back to sitting on the side of a flat bed without using bedrails?: A Lot Help needed moving to and from a bed to a chair (including a wheelchair)?: A Lot Help needed standing up from a chair using your arms (e.g., wheelchair or bedside chair)?: A Lot Help needed to walk in hospital room?: A Lot Help needed climbing 3-5 steps with a railing? : Total 6 Click Score: 12    End of Session Equipment Utilized During Treatment: Gait belt Activity Tolerance: Patient tolerated treatment well Patient left: in bed;with restraints reapplied;with nursing/sitter in room Nurse Communication: Mobility status PT Visit Diagnosis: Other abnormalities of gait and mobility (R26.89);Muscle weakness (generalized) (M62.81);Other symptoms and signs involving the nervous system (R29.898);Unsteadiness on feet (R26.81);Difficulty in walking, not elsewhere classified (R26.2)     Time: 1330-1402 PT  Time Calculation (min) (ACUTE ONLY): 32 min  Charges:  $Gait Training: 23-37 mins                     Lidiya Reise, PT, GCS 04/06/19,2:16 PM

## 2019-04-06 NOTE — Progress Notes (Signed)
IP reviewed and determined enteric precautions are not necessary per secure chat sent with Vella Kohler, RN.

## 2019-04-06 NOTE — Progress Notes (Signed)
On initial assessment, pt very confused and agitated, pt lungs sounded 'wet' and, despite constant urination and diarrhea, pt abdomen distended and tender to touch, discussed findings with on-call care provider, CXR, lasix, and foley catheter ordered, once foley placed immediately drained from bladder; by end of shift, lung sounds had improved, pt mentation seemed to have improved (pt able to appropriately respond to simple questions and able to speak a few coherent sentences), pt also seemed to have less agitation after foley placed/bladder drained

## 2019-04-06 NOTE — Progress Notes (Addendum)
Progress Note  Patient Name: Jonathan Berger Date of Encounter: 04/06/2019  Primary Cardiologist: Donato Heinz, MD   Subjective   Confused. Calling out for his mom. States he doesn't feel well but unable to elaborate. No specific complaints.  Inpatient Medications    Scheduled Meds: . amLODipine  5 mg Per Tube Daily  . aspirin  81 mg Per Tube Daily  . atorvastatin  40 mg Per Tube q1800  . chlorhexidine gluconate (MEDLINE KIT)  15 mL Mouth Rinse BID  . Chlorhexidine Gluconate Cloth  6 each Topical Daily  . donepezil  5 mg Per Tube QHS  . doxazosin  1 mg Per Tube Daily  . enoxaparin (LOVENOX) injection  40 mg Subcutaneous Q24H  . feeding supplement (OSMOLITE 1.5 CAL)  1,000 mL Per Tube Q24H  . feeding supplement (PRO-STAT SUGAR FREE 64)  30 mL Per Tube TID  . folic acid  1 mg Per Tube Daily  . free water  300 mL Per Tube Q6H  . Gerhardt's butt cream   Topical BID  . levalbuterol  0.63 mg Nebulization TID  . mouth rinse  15 mL Mouth Rinse 10 times per day  . metoprolol tartrate  50 mg Per Tube BID  . pantoprazole sodium  40 mg Per Tube BID  . QUEtiapine  25 mg Oral QHS  . thiamine  100 mg Per Tube Daily  . ticagrelor  90 mg Oral BID   Or  . ticagrelor  90 mg Per Tube BID   Continuous Infusions: . sodium chloride    . sodium chloride Stopped (04/01/19 1509)   PRN Meds: acetaminophen **OR** acetaminophen (TYLENOL) oral liquid 160 mg/5 mL **OR** acetaminophen, antiseptic oral rinse, bisacodyl, docusate sodium, levalbuterol, LORazepam, metoprolol tartrate, nitroGLYCERIN, sodium chloride flush   Vital Signs    Vitals:   04/05/19 2032 04/05/19 2350 04/06/19 0324 04/06/19 0838  BP: (!) 143/90 127/76 122/75   Pulse: (!) 103 83 88   Resp: (!) 22 (!) 22 18   Temp: 98.3 F (36.8 C) (!) 97.5 F (36.4 C) 98 F (36.7 C)   TempSrc: Axillary Oral Oral   SpO2: 95% 96% 98% 93%  Weight:      Height:        Intake/Output Summary (Last 24 hours) at 04/06/2019  0848 Last data filed at 04/06/2019 0639 Gross per 24 hour  Intake 909.5 ml  Output 7850 ml  Net -6940.5 ml   Filed Weights   03/31/19 0500 04/01/19 0500 04/02/19 0500  Weight: 81.2 kg 82.6 kg 84.7 kg    Telemetry    Predominately MAT with rates in the 120s this morning. Occasional PVCs and brief runs of NSVT - Personally Reviewed  ECG    No new tracings - Personally Reviewed  Physical Exam   GEN: Bed in trendelenburg, agitated.   Neck: No JVD, no carotid bruits, dry MM Cardiac: IRIR, no murmurs, rubs, or gallops.  Respiratory: Clear to auscultation bilaterally anteriorly, no wheezes/ rales/ rhonchi GI: NABS, Soft, nontender, non-distended  MS: No edema; No deformity. Neuro:  Confused, moving all extremities spontaneously Psych: Agitated.   Labs    Chemistry Recent Labs  Lab 04/04/19 0331 04/05/19 0735 04/06/19 0403  NA 144 142 145  K 2.8* 3.4* 3.4*  CL 112* 110 109  CO2 _0 GLUCOSE 148* 135* 132*  BUN 19 22 25*  CREATININE 1.03 1.13 1.16  CALCIUM 8.6* 8.9 9.3  GFRNONAA >60 59* 57*  GFRAA >60 >  60 >60  ANIONGAP _0 Hematology Recent Labs  Lab 04/04/19 0331 04/05/19 0735 04/06/19 0403  WBC 11.0* 8.3 8.1  RBC 3.53* 3.38* 3.45*  HGB 11.4* 10.9* 11.2*  HCT 34.5* 34.3* 34.6*  MCV 97.7 101.5* 100.3*  MCH 32.3 32.2 32.5  MCHC 33.0 31.8 32.4  RDW 13.8 14.2 14.6  PLT 253 286 313    Cardiac EnzymesNo results for input(s): TROPONINI in the last 168 hours. No results for input(s): TROPIPOC in the last 168 hours.   BNPNo results for input(s): BNP, PROBNP in the last 168 hours.   DDimer No results for input(s): DDIMER in the last 168 hours.   Radiology    DG CHEST PORT 1 VIEW  Result Date: 04/06/2019 CLINICAL DATA:  Short of breath, recent stroke EXAM: PORTABLE CHEST 1 VIEW COMPARISON:  04/04/2019 FINDINGS: Single frontal view of the chest demonstrates enteric catheter passing below diaphragm tip excluded by collimation. The cardiac  silhouette is enlarged but stable. Chronic central vascular congestion is again noted. Patchy areas of consolidation within the right upper and right lower lung zones are again noted, favor edema given the waxing and waning appearance since prior studies. No effusion or pneumothorax. IMPRESSION: 1. Slight worsening volume status, with right perihilar airspace disease superimposed upon chronic central vascular congestion. Electronically Signed   By: Randa Ngo M.D.   On: 04/06/2019 00:22   DG Chest Port 1 View  Result Date: 04/04/2019 CLINICAL DATA:  Wheezing EXAM: PORTABLE CHEST 1 VIEW COMPARISON:  Chest radiograph 04/03/2019 FINDINGS: Enteric tube courses below the field of view. Moderate cardiomegaly and central pulmonary vascular congestion. No pleural effusion or pneumothorax. No focal consolidation. IMPRESSION: Cardiomegaly and central pulmonary vascular congestion. Electronically Signed   By: Ulyses Jarred M.D.   On: 04/04/2019 15:13    Cardiac Studies   Echo 03/23/19: IMPRESSIONS    1. Left ventricular ejection fraction, by estimation, is 45%. The left  ventricle has mildly decreased function. The left ventrical demonstrates  regional wall motion abnormalities (see scoring diagram/findings for  description). Akinesis of the mid to  apical anteroseptal and inferoseptal walls. Left ventricular diastolic  parameters are consistent with Grade I diastolic dysfunction (impaired  relaxation).  2. Right ventricular systolic function is mildly reduced. The right  ventricular size is mildly enlarged. There is mildly elevated pulmonary  artery systolic pressure. The estimated right ventricular systolic  pressure is 46.8 mmHg.  3. The aortic valve is tricuspid. Trivial aortic insufficiency, no aortic  stenosis.  4. No significant mitral regurgitation.  5. Dilated IVC with estimated RA pressure 15 mmHg.    Patient Profile     [name] is an 22M with hypertension, dementia, and  prior tobacco abuse admitted with R ICA stenosis and stroke.   Assessment & Plan    1. MAT: noted on telemetry this admission. C/f possible Afib given recent stroke though careful review of telemetry did not reveal clear Afib. Additionally, triple therapy risks would outweigh benefits at this time. He was started on metoprolol, uptitrated to 32m BID for rate control. Rates persistently in the 120s this morning.  - Will increase metoprolol to 775mBID - can uptitrate as tolerated.   2. Acute combined CHF: Echo this admission revealed EF 45%. No clear anginal complaints. No known history of CAD. Possibly related to #1. CXR overnight suggests worsening of central vascular congestion. He received 4041mV lasix early this morning. I&Os incomplete due to numerous unmeasured urine outputs  though appears to be net negative volume status. O2 sat stable on RA. Pulmonary exam limited by patient cooperation, though no obvious w/r/r - Continue to dose lasix as needed - Will replete K 40 mEq this morning. - Can repeat echo in 3 months - if no improvement in EF, would then consider an ischemic evaluation if in line with patient's/family's GOC.   3. CVA: patient presented with acute R MCA stroke, s/p TPA and stenting of the R ICA. Now on aspirin and brilinta.  - Continue management per primary team/neurology  4. HTN: BP stable today. Home valsartan/HCTZ on hold - Increase metoprolol as above - Continue amlodipine and doxazosin  5. Acute hypoxic respiratory failure: 2/2 PNA. He has been intubated twice this admission. Last extubated 2/17. O2 sat stable on RA. Pulmonary exam limited by patient cooperation - Continue management per primary team.    For questions or updates, please contact Elizabeth Please consult [email or web address] for contact info under Cardiology/STEMI.      Signed, Abigail Butts, PA-C  04/06/2019, 8:48 AM   [phone]  Patient seen and examined.  Agree with above documentation.  On exam, oriented to person only, regular rate, irregular rhythm, lungs CTAB in anterior fields, no LE edema.  Telemetry personally reviewed, shows multifocal atrial tachycardia with rates in 80s, but is been up to 120s.  No clear atrial fibrillation.  Will increase metoprolol to 75 mg twice daily.  Donato Heinz, MD

## 2019-04-06 NOTE — Progress Notes (Signed)
  Speech Language Pathology Treatment: Dysphagia  Patient Details Name: Jonathan Berger MRN: [id number] DOB: 08-Nov-1931 Today's Date: 04/06/2019 Time: 3734-2876 SLP Time Calculation (min) (ACUTE ONLY): 11 min  Assessment / Plan / Recommendation Clinical Impression  Patient encountered in bed, HOB raised so he was sitting upright. Patient with Cortrak in place, breathing on RA. Marland Kitchen Patient able to state his name when prompted. He was impulsive during this session, attempting to jump out of the bed, requiring frequent re-direction and cues to attend. ST completed oral care with toothbrush via in-line suction, patient with with no upper teeth, but some lower teeth present. Oral cavity dry, but patient appeared to manage secretions well. Intelligibility reduced, but voice quality was clear.  Pt seen with ice chips: adequate oral acceptance, but limited bolus awareness, exhibiting verbosity with ice chip in mouth, allowing it to melt in right cheek. Pt did not follow cues to orally manipulate ice chip. He initiated a swallow after ice chips melting, no overt s/sx aspiration. Pt seen with small spoon sips of thin liquids (water): mild anterior spillage noted. Patient with 1 instance of immediate weak cough, ?swallow initiation delay. In other 4 trials, wet voice quality observed, but no other overt s/sx aspiration. Patient becoming increasingly impulsive, jerking body and attempting to climb out of the bed. Further PO trials not given as patient with impulsivity and decreased safety awareness.  Recommend continuation of NPO.   Patient may have ice chips or small sips of water following thorough oral care if he is sitting upright and alert.   ST will continue to follow for PO readiness/readiness to participate in MBSS.    HPI HPI: Pt is an 86 year old male presenting 2/7 with slurred speech and dysphagia, found to have acute R MCA stroke s/p tPA and EVR R MCA and ICA with stent. ETT 2/7-2/11. PMH: HTN.  Intubated 2/7-2/11.      SLP Plan  Continue with current plan of care       Recommendations  Diet recommendations: NPO Liquids provided via: Teaspoon Medication Administration: Via alternative means Supervision: Full supervision/cueing for compensatory strategies Postural Changes and/or Swallow Maneuvers: Seated upright 90 degrees                Oral Care Recommendations: Oral care prior to ice chip/H20;Oral care BID Follow up Recommendations: Inpatient Rehab SLP Visit Diagnosis: Dysphagia, oropharyngeal phase (R13.12) Plan: Continue with current plan of care       GO                Summar Mcglothlin 04/06/2019, 12:04 PM  Shella Spearing, M.Ed., CCC-SLP Speech Therapy Acute Rehabilitation [phone]: Acute Rehab office [phone] - pager

## 2019-04-06 NOTE — Progress Notes (Addendum)
PROGRESS NOTE  Jonathan Berger DHR:[id number] DOB: Jun 03, 1931 DOA: 03/21/2019 PCP: [name], MD  HPI/Recap of past 24 hours: HPI from Dr Jonathan Berger 86 yr old male with history of hypertension, who was admitted by Dr. [name] on 03/28/2019 for dense right ischemic MCA CVA, s/p TPA and VIR intervention with balloon angioplasty and stent placement at proximal R ICA. Pt developed right lower lobe aspiration pneumonitis after extubation on 03/26/2019 and was later re-intubated on 2/15 and extubated on 2/17 . Pt was also found to have new systolic CHF with EF 46%. Pt had been managed in the ICU and transferred to the floor, TRH assumed care on 04/03/19.    Overnight, noted to be retaining urine, sounded wet, was given IV Lasix and a Foley placed.  Today, patient continues to be restless, disoriented, unable to follow commands.  Noted to still be having diarrhea.    Assessment/Plan: Principal Problem:   Middle cerebral artery embolism, right Active Problems:   Acute on chronic respiratory failure with hypoxia (HCC)   Systolic dysfunction   Dehydration   Aspiration pneumonia of right lower lobe due to gastric secretions (HCC)   Tachycardia   Hypoxia   Paroxysmal atrial fibrillation (HCC)   Multifocal atrial tachycardia (HCC)   Palliative care by specialist   Goals of care, counseling/discussion   DNR (do not resuscitate)   Muscular weakness   Dysphagia   Xerostomia   Delirium   Acute hypoxemic respiratory failure likely 2/2 recent CVA/Aspiration PNA/acute CHF Status post intubation x2, recently extubated on 04/01/2019 Currently on room air Currently afebrile, with resolved leukocytosis BC x2 NGTD Tracheal aspirate culture growing staph epidermidis Repeat chest x-ray showed pulmonary vascular congestion S/P 1 dose of IV Lasix on 04/04/19, 04/06/19, decreased free water to 244ms Q8H Duo nebs scheduled, as needed Supplemental oxygen as needed Completed cefepime and vancomycin X 7 days on  04/05/19  Hypokalemia/hypophosphatemia Replace as needed  Right MCA infarct status post tPA and IR with L ICA stent placement MRI brain with right temporal lobe cortical/subcortical infarct with numerous small scattered R brain infarcts CTA head and neck showed right MCA LVO mid and distal segment.;  Right ICA critical stenosis, left ICA distal bulb string sign, severe B VA origin stenosis Neurology on board Echo showed EF 480% grade 1 diastolic dysfunction Carotid Doppler showed left ICA 60 to 79% stenosis LDL 80, A1c 5.9 Continue Brilinta, aspirin, Lipitor PT/OT/SLP Plan for further left ICA intervention in the future  Acute systolic and diastolic HF/MAT, ??A. fib Echo showed EF of 432% grade 1 diastolic dysfunction No prior history of known CAD Cardiology on board, plan to repeat echo as an outpatient and consider ischemic evaluation if EF remains depressed Cardiology on board, increased metoprolol for rate control, [facility] unlikely A. Fib (although hard to determine, also risk of triple therapy outweigh benefit) Due to repeat chest x-ray as above, s/p 1 dose of Lasix on 04/04/19 and 04/06/19, reduced free water  Acute urinary retention S/p foley catheter placement    Hypertension Continue amlodipine, valsartan-hydrochlorothiazide on hold  Dysphagia/generalized deconditioning N.p.o. Currently on tube feeds via cortrak SLP, dietitian on board Plan for CIR Vs SNF  ??Diarrhea Multiple bowel movements noted, ??tube feeds Ruled out C. difficile  Continue rectal tube, imodium prn  Dementia Currently with delirium, likely ICU delirium Start Seroquel, continue Aricept, restraints  [place] with very poor prognosis Palliative consulted for further goals of care        Malnutrition  Type:  Nutrition Problem: Inadequate oral intake Etiology: inability to eat   Malnutrition Characteristics:  Signs/Symptoms: NPO status   Nutrition  Interventions:  Interventions: MVI, Prostat, Tube feeding    Estimated body mass index is 26.04 kg/m as calculated from the following:   Height as of this encounter: '5\' 11"'  (1.803 m).   Weight as of this encounter: 84.7 kg.       Code Status: DNR  Family Communication: Discussed with wife on 04/05/19  Disposition Plan: Likely CIR vs SNF once significant clinical improvement   Consultants:  PCCM  Neurology  Cardiology  Palliative  Procedures: tPA and IR with L ICA stent placement  Antimicrobials:  Completed Cefepime and Vancomycin  DVT prophylaxis: Lovenox   Objective: Vitals:   04/05/19 2350 04/06/19 0324 04/06/19 0838 04/06/19 1131  BP: 127/76 122/75  (!) 149/86  Pulse: 83 88  86  Resp: (!) '22 18  16  ' Temp: (!) 97.5 F (36.4 C) 98 F (36.7 C)  (!) 97.5 F (36.4 C)  TempSrc: Oral Oral  Oral  SpO2: 96% 98% 93% 92%  Weight:      Height:        Intake/Output Summary (Last 24 hours) at 04/06/2019 1531 Last data filed at 04/06/2019 1500 Gross per 24 hour  Intake 1073.17 ml  Output 8450 ml  Net -7376.83 ml   Filed Weights   03/31/19 0500 04/01/19 0500 04/02/19 0500  Weight: 81.2 kg 82.6 kg 84.7 kg    Exam:  General: Restless, intermittent agitation, disoriented, chronically ill appearing  Cardiovascular: S1, S2 present  Respiratory: Mild bibasilar crackles noted  Abdomen: Soft, nontender, nondistended, bowel sounds present  Musculoskeletal: No bilateral pedal edema noted  Skin: Normal  Psychiatry: Unable to assess  Neurology: Unable to follow commands, but moving all extremities spontaneously      Data Reviewed: CBC: Recent Labs  Lab 04/01/19 0423 04/01/19 0423 04/02/19 0250 04/03/19 0439 04/04/19 0331 04/05/19 0735 04/06/19 0403  WBC 8.9   < > 12.2* 11.5* 11.0* 8.3 8.1  NEUTROABS 7.1  --   --  9.9* 8.9* 6.5 5.6  HGB 10.7*   < > 11.2* 11.7* 11.4* 10.9* 11.2*  HCT 33.9*   < > 35.3* 36.0* 34.5* 34.3* 34.6*  MCV 103.0*   < >  102.9* 99.4 97.7 101.5* 100.3*  PLT 181   < > 181 236 253 286 313   < > = values in this interval not displayed.   Basic Metabolic Panel: Recent Labs  Lab 04/01/19 0423 04/01/19 0423 04/02/19 0250 04/03/19 0439 04/04/19 0331 04/05/19 0735 04/06/19 0403  NA 149*   < > 148* 147* 144 142 145  K 3.6   < > 3.0* 2.8* 2.8* 3.4* 3.4*  CL 123*   < > 119* 115* 112* 110 109  CO2 21*   < > 21* '23 22 23 23  ' GLUCOSE 183*   < > 164* 151* 148* 135* 132*  BUN 34*   < > 29* '19 19 22 ' 25*  CREATININE 1.22   < > 1.04 0.97 1.03 1.13 1.16  CALCIUM 8.6*   < > 8.5* 8.4* 8.6* 8.9 9.3  MG 2.2  --  2.1 2.1  --   --   --   PHOS 2.1*  --  1.7* 2.3*  --  2.5  --    < > = values in this interval not displayed.   GFR: Estimated Creatinine Clearance: 48.7 mL/min (by C-G formula based on SCr of 1.16  mg/dL). Liver Function Tests: No results for input(s): AST, ALT, ALKPHOS, BILITOT, PROT, ALBUMIN in the last 168 hours. No results for input(s): LIPASE, AMYLASE in the last 168 hours. No results for input(s): AMMONIA in the last 168 hours. Coagulation Profile: No results for input(s): INR, PROTIME in the last 168 hours. Cardiac Enzymes: No results for input(s): CKTOTAL, CKMB, CKMBINDEX, TROPONINI in the last 168 hours. BNP (last 3 results) No results for input(s): PROBNP in the last 8760 hours. HbA1C: No results for input(s): HGBA1C in the last 72 hours. CBG: Recent Labs  Lab 04/05/19 2031 04/05/19 2351 04/06/19 0328 04/06/19 0749 04/06/19 1132  GLUCAP 138* 132* 105* 139* 130*   Lipid Profile: No results for input(s): CHOL, HDL, LDLCALC, TRIG, CHOLHDL, LDLDIRECT in the last 72 hours. Thyroid Function Tests: No results for input(s): TSH, T4TOTAL, FREET4, T3FREE, THYROIDAB in the last 72 hours. Anemia Panel: No results for input(s): VITAMINB12, FOLATE, FERRITIN, TIBC, IRON, RETICCTPCT in the last 72 hours. Urine analysis:    Component Value Date/Time   COLORURINE YELLOW 03/23/2019 1202    APPEARANCEUR CLEAR 03/23/2019 1202   LABSPEC 1.015 03/23/2019 1202   PHURINE 6.0 03/23/2019 1202   GLUCOSEU NEGATIVE 03/23/2019 1202   HGBUR LARGE (A) 03/23/2019 1202   BILIRUBINUR NEGATIVE 03/23/2019 1202   KETONESUR NEGATIVE 03/23/2019 1202   PROTEINUR NEGATIVE 03/23/2019 1202   UROBILINOGEN 0.2 05/07/2011 1128   NITRITE NEGATIVE 03/23/2019 1202   LEUKOCYTESUR TRACE (A) 03/23/2019 1202   Sepsis Labs: '@LABRCNTIP' (procalcitonin:4,lacticidven:4)  ) Recent Results (from the past 240 hour(s))  Culture, blood (x 2)     Status: None   Collection Time: 03/29/19 11:49 AM   Specimen: BLOOD  Result Value Ref Range Status   Specimen Description BLOOD RIGHT ANTECUBITAL  Final   Special Requests   Final    BOTTLES DRAWN AEROBIC ONLY Blood Culture adequate volume   Culture   Final    NO GROWTH 5 DAYS Performed at [facility], [place] [address], [place], [place] [address]    Report Status 04/03/2019 FINAL  Final  Culture, blood (x 2)     Status: None   Collection Time: 03/29/19 11:55 AM   Specimen: BLOOD RIGHT FOREARM  Result Value Ref Range Status   Specimen Description BLOOD RIGHT FOREARM  Final   Special Requests   Final    BOTTLES DRAWN AEROBIC ONLY Blood Culture adequate volume   Culture   Final    NO GROWTH 5 DAYS Performed at [facility], [place] [address], [place], [place] [address]    Report Status 04/03/2019 FINAL  Final  Culture, respiratory (non-expectorated)     Status: None   Collection Time: 04/01/19  9:15 AM   Specimen: Tracheal Aspirate; Respiratory  Result Value Ref Range Status   Specimen Description TRACHEAL ASPIRATE  Final   Special Requests NONE  Final   Gram Stain   Final    FEW WBC PRESENT,BOTH PMN AND MONONUCLEAR RARE YEAST RARE GRAM POSITIVE COCCI IN CLUSTERS Performed at [facility], [place] [address] [place],  [address]    Culture   Final    FEW STAPHYLOCOCCUS EPIDERMIDIS FEW CANDIDA ALBICANS    Report Status 04/05/2019 FINAL   Final   Organism ID, Bacteria STAPHYLOCOCCUS EPIDERMIDIS  Final      Susceptibility   Staphylococcus epidermidis - MIC*    CIPROFLOXACIN <=0.5 SENSITIVE Sensitive     ERYTHROMYCIN >=8 RESISTANT Resistant     GENTAMICIN <=0.5 SENSITIVE Sensitive  OXACILLIN >=4 RESISTANT Resistant     TETRACYCLINE 2 SENSITIVE Sensitive     VANCOMYCIN 2 SENSITIVE Sensitive     TRIMETH/SULFA 160 RESISTANT Resistant     CLINDAMYCIN <=0.25 SENSITIVE Sensitive     RIFAMPIN <=0.5 SENSITIVE Sensitive     Inducible Clindamycin NEGATIVE Sensitive     * FEW STAPHYLOCOCCUS EPIDERMIDIS  C difficile quick scan w PCR reflex     Status: None   Collection Time: 04/04/19  1:21 PM   Specimen: STOOL  Result Value Ref Range Status   C Diff antigen NEGATIVE NEGATIVE Final   C Diff toxin NEGATIVE NEGATIVE Final   C Diff interpretation No C. difficile detected.  Final    Comment: Performed at [facility], [place] [address], [place], [place] [address]      Studies: DG CHEST PORT 1 VIEW  Result Date: 04/06/2019 CLINICAL DATA:  Short of breath, recent stroke EXAM: PORTABLE CHEST 1 VIEW COMPARISON:  04/04/2019 FINDINGS: Single frontal view of the chest demonstrates enteric catheter passing below diaphragm tip excluded by collimation. The cardiac silhouette is enlarged but stable. Chronic central vascular congestion is again noted. Patchy areas of consolidation within the right upper and right lower lung zones are again noted, favor edema given the waxing and waning appearance since prior studies. No effusion or pneumothorax. IMPRESSION: 1. Slight worsening volume status, with right perihilar airspace disease superimposed upon chronic central vascular congestion. Electronically Signed   By: Randa Ngo M.D.   On: 04/06/2019 00:22    Scheduled Meds: . amLODipine  5 mg Per Tube Daily  . aspirin  81 mg Per Tube Daily  . atorvastatin  40 mg Per Tube q1800  . chlorhexidine gluconate (MEDLINE KIT)  15 mL Mouth Rinse BID   . Chlorhexidine Gluconate Cloth  6 each Topical Daily  . donepezil  5 mg Per Tube QHS  . doxazosin  1 mg Per Tube Daily  . enoxaparin (LOVENOX) injection  40 mg Subcutaneous Q24H  . feeding supplement (PRO-STAT SUGAR FREE 64)  30 mL Per Tube TID  . folic acid  1 mg Per Tube Daily  . free water  300 mL Per Tube Q6H  . Gerhardt's butt cream   Topical BID  . levalbuterol  0.63 mg Nebulization TID  . mouth rinse  15 mL Mouth Rinse 10 times per day  . metoprolol tartrate  75 mg Per Tube BID  . pantoprazole sodium  40 mg Per Tube BID  . QUEtiapine  25 mg Oral QHS  . thiamine  100 mg Per Tube Daily  . ticagrelor  90 mg Oral BID   Or  . ticagrelor  90 mg Per Tube BID    Continuous Infusions: . sodium chloride    . sodium chloride Stopped (04/01/19 1509)  . feeding supplement (JEVITY 1.5 CAL/FIBER)       LOS: 15 days     Alma Friendly, MD [facility]  If 7PM-7AM, please contact night-coverage [email or web address] 04/06/2019, 3:31 PM

## 2019-04-07 LAB — CBC WITH DIFFERENTIAL/PLATELET
Abs Immature Granulocytes: 0.04 10*3/uL (ref 0.00–0.07)
Basophils Absolute: 0 10*3/uL (ref 0.0–0.1)
Basophils Relative: 1 %
Eosinophils Absolute: 0.2 10*3/uL (ref 0.0–0.5)
Eosinophils Relative: 2 %
HCT: 36.4 % — ABNORMAL LOW (ref 39.0–52.0)
Hemoglobin: 11.6 g/dL — ABNORMAL LOW (ref 13.0–17.0)
Immature Granulocytes: 1 %
Lymphocytes Relative: 18 %
Lymphs Abs: 1.4 10*3/uL (ref 0.7–4.0)
MCH: 32.1 pg (ref 26.0–34.0)
MCHC: 31.9 g/dL (ref 30.0–36.0)
MCV: 100.8 fL — ABNORMAL HIGH (ref 80.0–100.0)
Monocytes Absolute: 0.7 10*3/uL (ref 0.1–1.0)
Monocytes Relative: 9 %
Neutro Abs: 5.2 10*3/uL (ref 1.7–7.7)
Neutrophils Relative %: 69 %
Platelets: 355 10*3/uL (ref 150–400)
RBC: 3.61 MIL/uL — ABNORMAL LOW (ref 4.22–5.81)
RDW: 14.6 % (ref 11.5–15.5)
WBC: 7.5 10*3/uL (ref 4.0–10.5)
nRBC: 0 % (ref 0.0–0.2)

## 2019-04-07 LAB — BASIC METABOLIC PANEL
Anion gap: 9 (ref 5–15)
BUN: 25 mg/dL — ABNORMAL HIGH (ref 8–23)
CO2: 25 mmol/L (ref 22–32)
Calcium: 9.6 mg/dL (ref 8.9–10.3)
Chloride: 114 mmol/L — ABNORMAL HIGH (ref 98–111)
Creatinine, Ser: 1.31 mg/dL — ABNORMAL HIGH (ref 0.61–1.24)
GFR calc Af Amer: 57 mL/min — ABNORMAL LOW (ref >60)
GFR calc non Af Amer: 49 mL/min — ABNORMAL LOW (ref >60)
Glucose, Bld: 156 mg/dL — ABNORMAL HIGH (ref 70–99)
Potassium: 3.5 mmol/L (ref 3.5–5.1)
Sodium: 148 mmol/L — ABNORMAL HIGH (ref 135–145)

## 2019-04-07 LAB — GLUCOSE, CAPILLARY
Glucose-Capillary: 113 mg/dL — ABNORMAL HIGH (ref 70–99)
Glucose-Capillary: 114 mg/dL — ABNORMAL HIGH (ref 70–99)
Glucose-Capillary: 127 mg/dL — ABNORMAL HIGH (ref 70–99)
Glucose-Capillary: 134 mg/dL — ABNORMAL HIGH (ref 70–99)
Glucose-Capillary: 140 mg/dL — ABNORMAL HIGH (ref 70–99)

## 2019-04-07 MED ORDER — FREE WATER
200.0000 mL | Freq: Four times a day (QID) | Status: DC
  Administered 2019-04-07 – 2019-04-08 (×4): 200 mL

## 2019-04-07 NOTE — Progress Notes (Signed)
Physical Therapy Treatment Patient Details Name: Jonathan Berger MRN: [id number] DOB: 01-22-1932 Today's Date: 04/07/2019    History of Present Illness 86 year old presenting 2/7 with slurred speech and dysphagia found to have acute right middle cerebral artery stroke s/p tPA and EVR R MCA and ICA with stent on 03/17/2019.  Extubated 2/11, reintubated 2/15-2/17.    PT Comments    Pt very lethargic today during session, difficult to arouse. Needed mod A +2 to come to EOB and max A +2 for sit<>stand several times and pivoting to and from [facility]. Pt was able to ambulate short distance yesterday but today had buckling knees and great difficulty stepping feet. Unclear whether this was due to lethargy of change of status. RN present and aware of pt's current status. PT will continue to follow.      Follow Up Recommendations  SNF;Supervision/Assistance - 24 hour     Equipment Recommendations  Other (comment)(to be determined)    Recommendations for Other Services       Precautions / Restrictions Precautions Precautions: Fall Precaution Comments: Cortrak, restraints, mitts, posey Restrictions Weight Bearing Restrictions: No    Mobility  Bed Mobility Overal bed mobility: Needs Assistance Bed Mobility: Supine to Sit;Sit to Supine     Supine to sit: Max assist;+2 for safety/equipment;HOB elevated Sit to supine: Mod assist;+2 for physical assistance   General bed mobility comments: pt required MAX A +2 for supine>sit as pt lethargic needing MAX A to manuever BLEs to EOB and elevate trunk into sitting with HOB elevated. Pt returned self to supine with MODA +2 to return BLEs back to bed. pt noted to attempt to return self to supine trying to lay head towards foot of bed towards L  Transfers Overall transfer level: Needs assistance Equipment used: 2 person hand held assist Transfers: Sit to/from [facility] Sit to Stand: Mod assist;Max assist;+2 physical assistance Stand  pivot transfers: Max assist;+2 physical assistance       General transfer comment: pt required MAX A +2 SPT from EOB<>BSC with +2 hand held assist with pt unable to come into full stand. Pt completed x3 sit<>stands from EOB and BSC needing MOD- MAX A +2 to power into standing with pt unable to come into full stand  Ambulation/Gait             General Gait Details: unable today, B knees buckling in standing and pt very slow with all mvmt. RN aware   Stairs             Wheelchair Mobility    Modified Rankin (Stroke Patients Only) Modified Rankin (Stroke Patients Only) Pre-Morbid Rankin Score: No symptoms Modified Rankin: Moderately severe disability     Balance Overall balance assessment: Needs assistance Sitting-balance support: Bilateral upper extremity supported;Feet supported Sitting balance-Leahy Scale: Poor Sitting balance - Comments: reliant on UE support and light min guard for safety   Standing balance support: Bilateral upper extremity supported;During functional activity Standing balance-Leahy Scale: Poor Standing balance comment: reliant on external support and BUE support                            Cognition Arousal/Alertness: Lethargic Behavior During Therapy: Restless Overall Cognitive Status: Impaired/Different from baseline Area of Impairment: Following commands;Safety/judgement;Awareness;Problem solving                 Orientation Level: Disoriented to;Time;Situation;Place Current Attention Level: Focused Memory: Decreased short-term memory Following Commands: Follows one step commands  with increased time;Follows one step commands inconsistently Safety/Judgement: Decreased awareness of safety;Decreased awareness of deficits Awareness: Intellectual Problem Solving: Slow processing;Requires verbal cues;Requires tactile cues;Difficulty sequencing;Decreased initiation General Comments: pt lethargic/ restless most of session with  mostly unintelligible speech. although pt appears to be HOH. pt following commands < 75% of time      Exercises      General Comments General comments (skin integrity, edema, etc.): VSS; pt noted to be soiled, changed brief and provided posterior pericare      Pertinent Vitals/Pain Pain Assessment: Faces Faces Pain Scale: Hurts even more Pain Location: general discomfort with mobility; grimacing when completing posterior pericare Pain Descriptors / Indicators: Grimacing;Moaning;Guarding;Discomfort Pain Intervention(s): Monitored during session;Repositioned    Home Living                      Prior Function            PT Goals (current goals can now be found in the care plan section) Acute Rehab PT Goals Patient Stated Goal: none stated PT Goal Formulation: With family Time For Goal Achievement: 04/20/19 Potential to Achieve Goals: Fair Progress towards PT goals: Not progressing toward goals - comment(lethargy)    Frequency    Min 3X/week      PT Plan Current plan remains appropriate    Co-evaluation PT/OT/SLP Co-Evaluation/Treatment: Yes Reason for Co-Treatment: Complexity of the patient's impairments (multi-system involvement);Necessary to address cognition/behavior during functional activity;For patient/therapist safety PT goals addressed during session: Mobility/safety with mobility;Balance;Strengthening/ROM        AM-PAC PT "6 Clicks" Mobility   Outcome Measure  Help needed turning from your back to your side while in a flat bed without using bedrails?: A Little Help needed moving from lying on your back to sitting on the side of a flat bed without using bedrails?: A Lot Help needed moving to and from a bed to a chair (including a wheelchair)?: A Lot Help needed standing up from a chair using your arms (e.g., wheelchair or bedside chair)?: A Lot Help needed to walk in hospital room?: Total Help needed climbing 3-5 steps with a railing? : Total 6  Click Score: 11    End of Session Equipment Utilized During Treatment: Gait belt Activity Tolerance: Patient tolerated treatment well Patient left: in bed;with restraints reapplied;with call bell/phone within reach;with bed alarm set Nurse Communication: Mobility status PT Visit Diagnosis: Other abnormalities of gait and mobility (R26.89);Muscle weakness (generalized) (M62.81);Other symptoms and signs involving the nervous system (R29.898);Unsteadiness on feet (R26.81);Difficulty in walking, not elsewhere classified (R26.2)     Time: 9604-5409 PT Time Calculation (min) (ACUTE ONLY): 37 min  Charges:  $Therapeutic Activity: 8-22 mins                     Lyanne Co, PT  Acute Rehab Services  Pager [phone] Office [phone]    Lawana Chambers Lorae Roig 04/07/2019, 11:05 AM

## 2019-04-07 NOTE — Progress Notes (Signed)
Occupational Therapy Treatment Patient Details Name: Jonathan Berger MRN: [id number] DOB: 26-Sep-1931 Today's Date: 04/07/2019    History of present illness 86 year old presenting 2/7 with slurred speech and dysphagia found to have acute right middle cerebral artery stroke s/p tPA and EVR R MCA and ICA with stent on 03/19/2019.  Extubated 2/11, reintubated 2/15-2/17.   OT comments  Pt making gradual progress towards OT goals this session. Pt asleep on arrival needing multimodal cues to awaken. Session focus on functional transfer training from EOB<>BSC needing MAX A +2. Pt lethargic throughout session with mostly unintelligible speech. Overall, pt required MOD- MAX A +2 for bed mobility and MAX A +2 for sit<>stand and STPs. Pt required total A for posterior pericare and LB ADLs. Assisted pt with oral care needing total A as large secretion build up noted in back of mouth- RN aware. DC plan currently remains appropriate, contingent on progression with therapies. Will follow acutely per POC.    Follow Up Recommendations  CIR;Supervision/Assistance - 24 hour    Equipment Recommendations  Other (comment)(defer to next venue of care)    Recommendations for Other Services      Precautions / Restrictions Precautions Precautions: Fall Precaution Comments: Cortrak, restraints, mitts, posey Restrictions Weight Bearing Restrictions: No       Mobility Bed Mobility Overal bed mobility: Needs Assistance Bed Mobility: Supine to Sit;Sit to Supine     Supine to sit: Max assist;+2 for safety/equipment;HOB elevated Sit to supine: Mod assist;+2 for physical assistance   General bed mobility comments: pt required MAX A +2 for supine>sit as pt lethargic needing MAX A to manuever BLEs to EOB and elevate trunk into sitting with HOB elevated. Pt returned self to supine with MODA +2 to return BLEs back to bed. pt noted to attempt to return self to supine trying to lay head towards foot of bed towards  L  Transfers Overall transfer level: Needs assistance Equipment used: 2 person hand held assist Transfers: Sit to/from [facility] Sit to Stand: Mod assist;Max assist;+2 physical assistance Stand pivot transfers: Max assist;+2 physical assistance       General transfer comment: pt required MAX A +2 SPT from EOB<>BSC with +2 hand held assist with pt unable to come into full stand. Pt completed x3 sit<>stands from EOB and BSC needing MOD- MAX A +2 to power into standing with pt unable to come into full stand    Balance Overall balance assessment: Needs assistance Sitting-balance support: Bilateral upper extremity supported;Feet supported Sitting balance-Leahy Scale: Poor Sitting balance - Comments: reliant on UE support and light min guard for safety   Standing balance support: Bilateral upper extremity supported;During functional activity Standing balance-Leahy Scale: Poor Standing balance comment: reliant on external support and BUE support                           ADL either performed or assessed with clinical judgement   ADL Overall ADL's : Needs assistance/impaired     Grooming: Oral care;Bed level;Total assistance Grooming Details (indicate cue type and reason): total A to clean out mouth d/t large secretion build up towards back of mouth     Lower Body Bathing: Sit to/from stand;+2 for physical assistance;Total assistance       Lower Body Dressing: Total assistance;Bed level Lower Body Dressing Details (indicate cue type and reason): to don socks Toilet Transfer: +2 for physical assistance;+2 for safety/equipment;Maximal assistance;BSC;Stand-pivot Toilet Transfer Details (indicate cue type and reason):  MAX A +2 SPT from EOB<>BSC Toileting- Clothing Manipulation and Hygiene: Maximal assistance;+2 for physical assistance;Sit to/from stand;Total assistance Toileting - Clothing Manipulation Details (indicate cue type and reason): total A +2 for  posterior pericare post BM     Functional mobility during ADLs: Maximal assistance;+2 for physical assistance;+2 for safety/equipment;Rolling walker General ADL Comments: pt lethargic this session limited by cognitive impairments, decreased balance/ safety and decreased ability to care for self. session focus on SPT from EOB<>BSC and posterior pericare post BM     Vision       Perception     Praxis      Cognition Arousal/Alertness: Lethargic Behavior During Therapy: Restless Overall Cognitive Status: Impaired/Different from baseline Area of Impairment: Following commands;Safety/judgement;Awareness;Problem solving                 Orientation Level: Disoriented to;Time;Situation;Place Current Attention Level: Focused Memory: Decreased short-term memory Following Commands: Follows one step commands with increased time;Follows one step commands inconsistently Safety/Judgement: Decreased awareness of safety;Decreased awareness of deficits Awareness: Intellectual Problem Solving: Slow processing;Requires verbal cues;Requires tactile cues;Difficulty sequencing;Decreased initiation General Comments: pt lethargic/ restless most of session with mostly unintelligible speech. although pt appears to be HOH. pt following commands < 75% of time        Exercises     Shoulder Instructions       General Comments VSS; pt noted to be soiled, changed brief and provided posterior pericare. Pt noted to have large secretion build up in back of mouth, assisted pt with oral care with little success- RN aware    Pertinent Vitals/ Pain       Pain Assessment: Faces Faces Pain Scale: Hurts even more Pain Location: general discomfort with mobility; grimacing when completing posterior pericare Pain Descriptors / Indicators: Grimacing;Moaning;Guarding;Discomfort Pain Intervention(s): Monitored during session;Repositioned  Home Living                                           Prior Functioning/Environment              Frequency  Min 2X/week        Progress Toward Goals  OT Goals(current goals can now be found in the care plan section)  Progress towards OT goals: Progressing toward goals  Acute Rehab OT Goals Patient Stated Goal: none stated OT Goal Formulation: With patient Time For Goal Achievement: 04/09/19 Potential to Achieve Goals: Good  Plan Discharge plan remains appropriate    Co-evaluation      Reason for Co-Treatment: Complexity of the patient's impairments (multi-system involvement);Necessary to address cognition/behavior during functional activity;For patient/therapist safety PT goals addressed during session: Mobility/safety with mobility;Balance;Strengthening/ROM        AM-PAC OT "6 Clicks" Daily Activity     Outcome Measure   Help from another person eating meals?: Total(NPO) Help from another person taking care of personal grooming?: A Little Help from another person toileting, which includes using toliet, bedpan, or urinal?: A Lot Help from another person bathing (including washing, rinsing, drying)?: A Lot Help from another person to put on and taking off regular upper body clothing?: A Lot Help from another person to put on and taking off regular lower body clothing?: A Lot 6 Click Score: 12    End of Session    OT Visit Diagnosis: Other abnormalities of gait and mobility (R26.89);Muscle weakness (generalized) (M62.81);Other symptoms and signs involving cognitive function;Hemiplegia  and hemiparesis Hemiplegia - Right/Left: Left Hemiplegia - caused by: Cerebral infarction   Activity Tolerance Patient limited by fatigue;Patient limited by lethargy   Patient Left in bed;with call bell/phone within reach;with bed alarm set   Nurse Communication Mobility status        Time: 0865-7846 OT Time Calculation (min): 33 min  Charges: OT General Charges $OT Visit: 1 Visit OT Treatments $Self Care/Home Management  : 8-22 mins  Audery Amel., COTA/L Acute Rehabilitation Services [phone]    Angelina Pih 04/07/2019, 11:06 AM

## 2019-04-07 NOTE — Progress Notes (Addendum)
Progress Note  Patient Name: Jonathan Berger Date of Encounter: 04/07/2019  Primary Cardiologist: Donato Heinz, MD   Subjective   Confused, unable to understand most words. Responds to his name  Inpatient Medications    Scheduled Meds: . amLODipine  5 mg Per Tube Daily  . aspirin  81 mg Per Tube Daily  . atorvastatin  40 mg Per Tube q1800  . chlorhexidine gluconate (MEDLINE KIT)  15 mL Mouth Rinse BID  . Chlorhexidine Gluconate Cloth  6 each Topical Daily  . donepezil  5 mg Per Tube QHS  . doxazosin  1 mg Per Tube Daily  . enoxaparin (LOVENOX) injection  40 mg Subcutaneous Q24H  . feeding supplement (PRO-STAT SUGAR FREE 64)  30 mL Per Tube TID  . folic acid  1 mg Per Tube Daily  . free water  200 mL Per Tube Q6H  . Gerhardt's butt cream   Topical BID  . mouth rinse  15 mL Mouth Rinse 10 times per day  . metoprolol tartrate  75 mg Per Tube BID  . pantoprazole sodium  40 mg Per Tube BID  . QUEtiapine  25 mg Oral QHS  . thiamine  100 mg Per Tube Daily  . ticagrelor  90 mg Oral BID   Or  . ticagrelor  90 mg Per Tube BID   Continuous Infusions: . sodium chloride    . sodium chloride Stopped (04/01/19 1509)  . feeding supplement (JEVITY 1.5 CAL/FIBER) 50 mL/hr at 04/07/19 0215   PRN Meds: acetaminophen **OR** acetaminophen (TYLENOL) oral liquid 160 mg/5 mL **OR** acetaminophen, antiseptic oral rinse, bisacodyl, docusate sodium, levalbuterol, loperamide, LORazepam, metoprolol tartrate, nitroGLYCERIN, sodium chloride flush   Vital Signs    Vitals:   04/07/19 0051 04/07/19 0338 04/07/19 0600 04/07/19 0834  BP: 130/69 127/66  125/76  Pulse: 62 84  80  Resp: '20 18  20  ' Temp: 97.6 F (36.4 C) 97.9 F (36.6 C)  98.5 F (36.9 C)  TempSrc: Axillary Oral  Oral  SpO2: 99% 98%  96%  Weight:   86.2 kg   Height:        Intake/Output Summary (Last 24 hours) at 04/07/2019 1048 Last data filed at 04/07/2019 0600 Gross per 24 hour  Intake 1167.5 ml  Output 1375 ml   Net -207.5 ml   Filed Weights   04/01/19 0500 04/02/19 0500 04/07/19 0600  Weight: 82.6 kg 84.7 kg 86.2 kg    Telemetry    SR, PVCs - Personally Reviewed  ECG    No new tracings - Personally Reviewed  Physical Exam   General: Well developed, frail, elderly, male a little agitated Head: Eyes PERRLA, Head normocephalic and atraumatic, lips very dry Lungs: some coarse breath sounds, rare wheeze, bilaterally to auscultation. Heart: HRRR, S1 S2, without rub or gallop. No murmur. 4/4 extremity pulses are 2+ & equal. No JVD. Abdomen: Bowel sounds are present, abdomen soft and non-tender without masses or  hernias noted. Msk: Normal strength and tone for age. Extremities: No clubbing, cyanosis or edema.    Skin:  No rashes or lesions noted. Neuro: Alert and oriented X 1.   Labs    Chemistry Recent Labs  Lab 04/05/19 0735 04/06/19 0403 04/07/19 0404  NA 142 145 148*  K 3.4* 3.4* 3.5  CL 110 109 114*  CO2 '23 23 25  ' GLUCOSE 135* 132* 156*  BUN 22 25* 25*  CREATININE 1.13 1.16 1.31*  CALCIUM 8.9 9.3 9.6  GFRNONAA 59*  57* 49*  GFRAA >60 >60 57*  ANIONGAP '9 13 9     ' Hematology Recent Labs  Lab 04/05/19 0735 04/06/19 0403 04/07/19 0404  WBC 8.3 8.1 7.5  RBC 3.38* 3.45* 3.61*  HGB 10.9* 11.2* 11.6*  HCT 34.3* 34.6* 36.4*  MCV 101.5* 100.3* 100.8*  MCH 32.2 32.5 32.1  MCHC 31.8 32.4 31.9  RDW 14.2 14.6 14.6  PLT 286 313 355    Cardiac Enzymes High Sensitivity Troponin:   Recent Labs  Lab 03/24/19 0950 03/29/19 0920 03/29/19 1149  TROPONINIHS 27* 30* 33*       Radiology    CT HEAD WO CONTRAST  Result Date: 04/06/2019 CLINICAL DATA:  Patient continues to remain altered, evaluate for intracranial hemorrhage versus mass effect, stroke, follow-up. EXAM: CT HEAD WITHOUT CONTRAST TECHNIQUE: Contiguous axial images were obtained from the base of the skull through the vertex without intravenous contrast. COMPARISON:  Noncontrast head CT 03/30/2019, brain MRI  03/23/2019. FINDINGS: Brain: The examination is mildly motion degraded. A now subacute cortical/subcortical right temporal lobe infarct has not significantly changed in extent as compared to head CT 03/30/2019. No evidence of hemorrhagic conversion. No significant mass effect. No midline shift. Multiple known additional subacute right MCA territory infarcts were better appreciated on MRI 03/23/2019. No new demarcated cortical infarct is identified. No evidence of intracranial mass. No extra-axial fluid collection. Stable background generalized parenchymal atrophy and chronic small vessel ischemic disease. Vascular: Atherosclerotic calcifications. No suspicious intracranial vascular hyperdensity. Skull: Normal. Negative for fracture or focal lesion. Sinuses/Orbits: Visualized orbits demonstrate no acute abnormality. Scattered mucosal thickening greatest within the inferior right maxillary sinus. Partially visualized nasoenteric tube. IMPRESSION: Mildly motion degraded exam. A subacute right temporal lobe ischemic infarct has not significantly changed in extent from head CT 03/30/2019. No significant associated mass effect. No hemorrhagic conversion. Additional small subacute right MCA infarcts were better appreciated on MRI 03/23/2019. No evidence of new acute intracranial abnormality. Electronically Signed   By: Kellie Simmering DO   On: 04/06/2019 19:59   DG CHEST PORT 1 VIEW  Result Date: 04/06/2019 CLINICAL DATA:  Short of breath, recent stroke EXAM: PORTABLE CHEST 1 VIEW COMPARISON:  04/04/2019 FINDINGS: Single frontal view of the chest demonstrates enteric catheter passing below diaphragm tip excluded by collimation. The cardiac silhouette is enlarged but stable. Chronic central vascular congestion is again noted. Patchy areas of consolidation within the right upper and right lower lung zones are again noted, favor edema given the waxing and waning appearance since prior studies. No effusion or pneumothorax.  IMPRESSION: 1. Slight worsening volume status, with right perihilar airspace disease superimposed upon chronic central vascular congestion. Electronically Signed   By: Randa Ngo M.D.   On: 04/06/2019 00:22    Cardiac Studies   Echo 03/23/19: IMPRESSIONS    1. Left ventricular ejection fraction, by estimation, is 45%. The left  ventricle has mildly decreased function. The left ventrical demonstrates  regional wall motion abnormalities (see scoring diagram/findings for  description). Akinesis of the mid to  apical anteroseptal and inferoseptal walls. Left ventricular diastolic  parameters are consistent with Grade I diastolic dysfunction (impaired  relaxation).  2. Right ventricular systolic function is mildly reduced. The right  ventricular size is mildly enlarged. There is mildly elevated pulmonary  artery systolic pressure. The estimated right ventricular systolic  pressure is 84.1 mmHg.  3. The aortic valve is tricuspid. Trivial aortic insufficiency, no aortic  stenosis.  4. No significant mitral regurgitation.  5. Dilated IVC with estimated  RA pressure 15 mmHg.    Patient Profile     Mr. Visconti is an 36M with hypertension, dementia, and prior tobacco abuse admitted 02/07 with R ICA stenosis and stroke.   Assessment & Plan    1. MAT:  - not Afib on tele review - not a candidate for triple therapy - metop currently at 75 mg bid - BP/HR better controlled today  2. Acute combined CHF:  - EF 45% by echo this admit, + WMA - ?2nd tachycardia vs ischemia but pt not a candidate for ischemic eval at this time - wt trending up, admit wt 189>>179 on 02/16>>190 today - no obvious volume overload, bed wts may be variable at baseline - got Lasix 40 mg IV 02/22, Cr up a little today - will give another 20 meq Kdur  3. CVA:  - presented w/ R-MCA CVA, s/p TPA & stent R-ICA - continue ASA/Brilinta - per IM/Neurology  4. HTN:  - not on home valsartan/HCTZ - continue  BB/amlodipine/doxazosin - SBP 120s-130s today  5. Acute hypoxic respiratory failure:  - 2nd PNA, s/p ETT 02/07-02/11 and 02/15-02/17 - O2 sats currently 96% on RA - Per IM     For questions or updates, please contact [place] Please consult [email or web address] for contact info under Cardiology/STEMI.      Signed, Rosaria Ferries, PA-C  04/07/2019, 10:48 AM    Patient seen and examined.  Agree with above documentation.  On exam, patient is somnolent but arousable, regular rate, irregular rhythm, no murmurs, lungs CTAB in anterior fields, no LE edema.  Continues to have multifocal atrial tachycardia on telemetry, rates appear better controlled today on metoprolol 75 mg twice daily.  Would continue.   Agree with ongoing goals of care discussion.  Donato Heinz, MD

## 2019-04-07 NOTE — Progress Notes (Addendum)
PROGRESS NOTE  Jonathan Berger MMH:[id number] DOB: 01/29/1932 DOA: 03/17/2019 PCP: [name], MD  HPI/Recap of past 24 hours: HPI from Dr Jonathan Berger 86 yr old male with history of hypertension, who was admitted by Dr. [name] on 03/29/2019 for dense right ischemic MCA CVA, s/p TPA and VIR intervention with balloon angioplasty and stent placement at proximal R ICA. Pt developed right lower lobe aspiration pneumonitis after extubation on 03/26/2019 and was later re-intubated on 2/15 and extubated on 2/17 . Pt was also found to have new systolic CHF with EF 15%. Pt had been managed in the ICU and transferred to the floor, TRH assumed care on 04/03/19.      Today, patient noted to be sleeping, still somewhat restless, disoriented.  Unable to perform ROS.      Assessment/Plan: Principal Problem:   Middle cerebral artery embolism, right Active Problems:   Acute on chronic respiratory failure with hypoxia (HCC)   Systolic dysfunction   Dehydration   Aspiration pneumonia of right lower lobe due to gastric secretions (HCC)   Tachycardia   Hypoxia   Paroxysmal atrial fibrillation (HCC)   Multifocal atrial tachycardia (HCC)   Palliative care by specialist   Goals of care, counseling/discussion   DNR (do not resuscitate)   Muscular weakness   Dysphagia   Xerostomia   Delirium   Acute hypoxemic respiratory failure likely 2/2 recent CVA/Aspiration PNA/acute CHF Status post intubation x2, recently extubated on 04/01/2019 Currently on room air Currently afebrile, with resolved leukocytosis BC x2 NGTD Tracheal aspirate culture growing staph epidermidis Repeat chest x-ray showed pulmonary vascular congestion S/P 1 dose of IV Lasix on 04/04/19, 04/06/19, decreased free water to 250ms Q6H Duo nebs scheduled, as needed Supplemental oxygen as needed Completed cefepime and vancomycin X 7 days on 04/05/19  Hypokalemia/hypophosphatemia Replace as needed  Right MCA infarct status post tPA and IR  with L ICA stent placement MRI brain with right temporal lobe cortical/subcortical infarct with numerous small scattered R brain infarcts CTA head and neck showed right MCA LVO mid and distal segment.;  Right ICA critical stenosis, left ICA distal bulb string sign, severe B VA origin stenosis Repeat CT head on 04/06/2019 unremarkable Neurology on board Echo showed EF 494% grade 1 diastolic dysfunction Carotid Doppler showed left ICA 60 to 79% stenosis LDL 80, A1c 5.9 Continue Brilinta, aspirin, Lipitor PT/OT/SLP Plan for further left ICA intervention in the future  Acute systolic and diastolic HF/MAT, ??A. fib Echo showed EF of 458% grade 1 diastolic dysfunction No prior history of known CAD Cardiology on board, plan to repeat echo as an outpatient and consider ischemic evaluation if EF remains depressed Cardiology on board, increased metoprolol for rate control, [facility] unlikely A. Fib (although hard to determine, also risk of triple therapy outweigh benefit) Due to repeat chest x-ray as above, s/p 1 dose of Lasix on 04/04/19 and 04/06/19, reduced free water  Acute urinary retention S/p foley catheter placement   AKI Mild bump in Cr Likely ? lasix  Daily BMP  Hypertension Continue amlodipine, valsartan-hydrochlorothiazide on hold  Dysphagia/generalized deconditioning N.p.o. Currently on tube feeds via cortrak SLP, dietitian on board Plan for CIR Vs likely SNF  ??Diarrhea Multiple bowel movements noted, ??tube feeds Ruled out C. difficile  Continue rectal tube, imodium prn  Dementia Currently with delirium, likely ICU/hospital delirium Continue Seroquel, continue Aricept, restraints  GDenhoffPatient with very poor prognosis Palliative consulted for further goals of care  Malnutrition Type:  Nutrition Problem: Inadequate oral intake Etiology: inability to eat   Malnutrition Characteristics:  Signs/Symptoms: NPO status   Nutrition  Interventions:  Interventions: MVI, Prostat, Tube feeding    Estimated body mass index is 26.5 kg/m as calculated from the following:   Height as of this encounter: '5\' 11"'  (1.803 m).   Weight as of this encounter: 86.2 kg.       Code Status: DNR  Family Communication: Discussed with wife on 04/07/19 about poor prognosis of patient  Disposition Plan: Likely CIR vs SNF once significant clinical improvement   Consultants:  PCCM  Neurology  Cardiology  Palliative  Procedures: tPA and IR with L ICA stent placement  Antimicrobials:  Completed Cefepime and Vancomycin  DVT prophylaxis: Lovenox   Objective: Vitals:   04/07/19 0338 04/07/19 0600 04/07/19 0834 04/07/19 1157  BP: 127/66  125/76 (!) 141/75  Pulse: 84  80 75  Resp: '18  20 19  ' Temp: 97.9 F (36.6 C)  98.5 F (36.9 C) 98.5 F (36.9 C)  TempSrc: Oral  Oral   SpO2: 98%  96% 97%  Weight:  86.2 kg    Height:        Intake/Output Summary (Last 24 hours) at 04/07/2019 1400 Last data filed at 04/07/2019 1100 Gross per 24 hour  Intake 1367.5 ml  Output 1725 ml  Net -357.5 ml   Filed Weights   04/01/19 0500 04/02/19 0500 04/07/19 0600  Weight: 82.6 kg 84.7 kg 86.2 kg    Exam:  General: Restless, disoriented, chronically ill appearing  Cardiovascular: S1, S2 present  Respiratory: Mild bibasilar crackles noted  Abdomen: Soft, nontender, nondistended, bowel sounds present  Musculoskeletal: No bilateral pedal edema noted  Skin: Normal  Psychiatry: Unable to assess  Neurology: Unable to follow commands, but moving all extremities spontaneously      Data Reviewed: CBC: Recent Labs  Lab 04/03/19 0439 04/04/19 0331 04/05/19 0735 04/06/19 0403 04/07/19 0404  WBC 11.5* 11.0* 8.3 8.1 7.5  NEUTROABS 9.9* 8.9* 6.5 5.6 5.2  HGB 11.7* 11.4* 10.9* 11.2* 11.6*  HCT 36.0* 34.5* 34.3* 34.6* 36.4*  MCV 99.4 97.7 101.5* 100.3* 100.8*  PLT 236 253 286 313 638   Basic Metabolic Panel: Recent  Labs  Lab 04/01/19 0423 04/01/19 0423 04/02/19 0250 04/02/19 0250 04/03/19 0439 04/04/19 0331 04/05/19 0735 04/06/19 0403 04/07/19 0404  NA 149*   < > 148*   < > 147* 144 142 145 148*  K 3.6   < > 3.0*   < > 2.8* 2.8* 3.4* 3.4* 3.5  CL 123*   < > 119*   < > 115* 112* 110 109 114*  CO2 21*   < > 21*   < > '23 22 23 23 25  ' GLUCOSE 183*   < > 164*   < > 151* 148* 135* 132* 156*  BUN 34*   < > 29*   < > '19 19 22 ' 25* 25*  CREATININE 1.22   < > 1.04   < > 0.97 1.03 1.13 1.16 1.31*  CALCIUM 8.6*   < > 8.5*   < > 8.4* 8.6* 8.9 9.3 9.6  MG 2.2  --  2.1  --  2.1  --   --   --   --   PHOS 2.1*  --  1.7*  --  2.3*  --  2.5  --   --    < > = values in this interval not displayed.   GFR: Estimated  Creatinine Clearance: 43.1 mL/min (A) (by C-G formula based on SCr of 1.31 mg/dL (H)). Liver Function Tests: No results for input(s): AST, ALT, ALKPHOS, BILITOT, PROT, ALBUMIN in the last 168 hours. No results for input(s): LIPASE, AMYLASE in the last 168 hours. No results for input(s): AMMONIA in the last 168 hours. Coagulation Profile: No results for input(s): INR, PROTIME in the last 168 hours. Cardiac Enzymes: No results for input(s): CKTOTAL, CKMB, CKMBINDEX, TROPONINI in the last 168 hours. BNP (last 3 results) No results for input(s): PROBNP in the last 8760 hours. HbA1C: No results for input(s): HGBA1C in the last 72 hours. CBG: Recent Labs  Lab 04/06/19 1539 04/06/19 2031 04/06/19 2350 04/07/19 0336 04/07/19 0805  GLUCAP 152* 109* 113* 114* 127*   Lipid Profile: No results for input(s): CHOL, HDL, LDLCALC, TRIG, CHOLHDL, LDLDIRECT in the last 72 hours. Thyroid Function Tests: No results for input(s): TSH, T4TOTAL, FREET4, T3FREE, THYROIDAB in the last 72 hours. Anemia Panel: No results for input(s): VITAMINB12, FOLATE, FERRITIN, TIBC, IRON, RETICCTPCT in the last 72 hours. Urine analysis:    Component Value Date/Time   COLORURINE YELLOW 03/23/2019 1202   APPEARANCEUR CLEAR  03/23/2019 1202   LABSPEC 1.015 03/23/2019 1202   PHURINE 6.0 03/23/2019 1202   GLUCOSEU NEGATIVE 03/23/2019 1202   HGBUR LARGE (A) 03/23/2019 1202   BILIRUBINUR NEGATIVE 03/23/2019 1202   KETONESUR NEGATIVE 03/23/2019 1202   PROTEINUR NEGATIVE 03/23/2019 1202   UROBILINOGEN 0.2 05/07/2011 1128   NITRITE NEGATIVE 03/23/2019 1202   LEUKOCYTESUR TRACE (A) 03/23/2019 1202   Sepsis Labs: '@LABRCNTIP' (procalcitonin:4,lacticidven:4)  ) Recent Results (from the past 240 hour(s))  Culture, blood (x 2)     Status: None   Collection Time: 03/29/19 11:49 AM   Specimen: BLOOD  Result Value Ref Range Status   Specimen Description BLOOD RIGHT ANTECUBITAL  Final   Special Requests   Final    BOTTLES DRAWN AEROBIC ONLY Blood Culture adequate volume   Culture   Final    NO GROWTH 5 DAYS Performed at [facility], [place] [address], [place], [place] [address]    Report Status 04/03/2019 FINAL  Final  Culture, blood (x 2)     Status: None   Collection Time: 03/29/19 11:55 AM   Specimen: BLOOD RIGHT FOREARM  Result Value Ref Range Status   Specimen Description BLOOD RIGHT FOREARM  Final   Special Requests   Final    BOTTLES DRAWN AEROBIC ONLY Blood Culture adequate volume   Culture   Final    NO GROWTH 5 DAYS Performed at [facility], [place] [address] [place], [place] [address]    Report Status 04/03/2019 FINAL  Final  Culture, respiratory (non-expectorated)     Status: None   Collection Time: 04/01/19  9:15 AM   Specimen: Tracheal Aspirate; Respiratory  Result Value Ref Range Status   Specimen Description TRACHEAL ASPIRATE  Final   Special Requests NONE  Final   Gram Stain   Final    FEW WBC PRESENT,BOTH PMN AND MONONUCLEAR RARE YEAST RARE GRAM POSITIVE COCCI IN CLUSTERS Performed at [facility], [place] [address], [place], [place] [address]    Culture   Final    FEW STAPHYLOCOCCUS EPIDERMIDIS FEW CANDIDA ALBICANS    Report Status 04/05/2019 FINAL  Final   Organism  ID, Bacteria STAPHYLOCOCCUS EPIDERMIDIS  Final      Susceptibility   Staphylococcus epidermidis - MIC*    CIPROFLOXACIN <=0.5 SENSITIVE Sensitive  ERYTHROMYCIN >=8 RESISTANT Resistant     GENTAMICIN <=0.5 SENSITIVE Sensitive     OXACILLIN >=4 RESISTANT Resistant     TETRACYCLINE 2 SENSITIVE Sensitive     VANCOMYCIN 2 SENSITIVE Sensitive     TRIMETH/SULFA 160 RESISTANT Resistant     CLINDAMYCIN <=0.25 SENSITIVE Sensitive     RIFAMPIN <=0.5 SENSITIVE Sensitive     Inducible Clindamycin NEGATIVE Sensitive     * FEW STAPHYLOCOCCUS EPIDERMIDIS  C difficile quick scan w PCR reflex     Status: None   Collection Time: 04/04/19  1:21 PM   Specimen: STOOL  Result Value Ref Range Status   C Diff antigen NEGATIVE NEGATIVE Final   C Diff toxin NEGATIVE NEGATIVE Final   C Diff interpretation No C. difficile detected.  Final    Comment: Performed at [facility], [place] [address], [place], [place] [address]      Studies: CT HEAD WO CONTRAST  Result Date: 04/06/2019 CLINICAL DATA:  Patient continues to remain altered, evaluate for intracranial hemorrhage versus mass effect, stroke, follow-up. EXAM: CT HEAD WITHOUT CONTRAST TECHNIQUE: Contiguous axial images were obtained from the base of the skull through the vertex without intravenous contrast. COMPARISON:  Noncontrast head CT 03/30/2019, brain MRI 03/23/2019. FINDINGS: Brain: The examination is mildly motion degraded. A now subacute cortical/subcortical right temporal lobe infarct has not significantly changed in extent as compared to head CT 03/30/2019. No evidence of hemorrhagic conversion. No significant mass effect. No midline shift. Multiple known additional subacute right MCA territory infarcts were better appreciated on MRI 03/23/2019. No new demarcated cortical infarct is identified. No evidence of intracranial mass. No extra-axial fluid collection. Stable background generalized parenchymal atrophy and chronic small vessel ischemic  disease. Vascular: Atherosclerotic calcifications. No suspicious intracranial vascular hyperdensity. Skull: Normal. Negative for fracture or focal lesion. Sinuses/Orbits: Visualized orbits demonstrate no acute abnormality. Scattered mucosal thickening greatest within the inferior right maxillary sinus. Partially visualized nasoenteric tube. IMPRESSION: Mildly motion degraded exam. A subacute right temporal lobe ischemic infarct has not significantly changed in extent from head CT 03/30/2019. No significant associated mass effect. No hemorrhagic conversion. Additional small subacute right MCA infarcts were better appreciated on MRI 03/23/2019. No evidence of new acute intracranial abnormality. Electronically Signed   By: Kellie Simmering DO   On: 04/06/2019 19:59    Scheduled Meds: . amLODipine  5 mg Per Tube Daily  . aspirin  81 mg Per Tube Daily  . atorvastatin  40 mg Per Tube q1800  . chlorhexidine gluconate (MEDLINE KIT)  15 mL Mouth Rinse BID  . Chlorhexidine Gluconate Cloth  6 each Topical Daily  . donepezil  5 mg Per Tube QHS  . doxazosin  1 mg Per Tube Daily  . enoxaparin (LOVENOX) injection  40 mg Subcutaneous Q24H  . feeding supplement (PRO-STAT SUGAR FREE 64)  30 mL Per Tube TID  . folic acid  1 mg Per Tube Daily  . free water  200 mL Per Tube Q6H  . Gerhardt's butt cream   Topical BID  . mouth rinse  15 mL Mouth Rinse 10 times per day  . metoprolol tartrate  75 mg Per Tube BID  . pantoprazole sodium  40 mg Per Tube BID  . QUEtiapine  25 mg Oral QHS  . thiamine  100 mg Per Tube Daily  . ticagrelor  90 mg Oral BID   Or  . ticagrelor  90 mg Per Tube BID    Continuous Infusions: . sodium chloride    .  sodium chloride Stopped (04/01/19 1509)  . feeding supplement (JEVITY 1.5 CAL/FIBER) 50 mL/hr at 04/07/19 0215     LOS: 16 days     Alma Friendly, MD [facility]  If 7PM-7AM, please contact night-coverage [email or web address] 04/07/2019, 2:00 PM

## 2019-04-07 NOTE — Progress Notes (Signed)
PALLIATIVE NOTE:  Patient lying in bed restless. Eyes closed. Will not open on verbal stimuli or touch. Sitter is present. Coretrak in place. No family at the bedside.   Attempted to reach patient's daughter [name] at number listed as well as patient's wife. Voicemail left. Will attempt again at a later date/time.   Patient continues to show no signs of improvement. Prognosis remains poor. If family makes the decision to transition care to a more comfort/EOL approach patient's prognosis would be 2 weeks or less (days).  1820: attempted to reach daughter. She did return my call earlier today however, I was with another patient. Message left again.   Plan -DNR -Ongoing GOC discussion with daughter/wife. Patient is not showing signs of improvement. Prognosis is poor. Patient most likely will not have meaningful recovery. Cortrak in place however quality of life remains poor and patient restless.  - If family makes the decision to transition care to comfort/EOL anticipated hospital death to be expected with possibility to transfer to hospice home if stable with symptoms. Wife would be unable to care for him in the home without additional support.  -Unable to connect with family today. Will have colleague to follow-up tomorrow for continued discussions.   Time Total: 20 min.   Visit consisted of counseling and education dealing with the complex and emotionally intense issues of symptom management and palliative care in the setting of serious and potentially life-threatening illness.Greater than 50%  of this time was spent counseling and coordinating care related to the above assessment and plan.  Willette Alma, AGPCNP-BC  Palliative Medicine Team [phone]

## 2019-04-08 DIAGNOSIS — R1319 Other dysphagia: Secondary | ICD-10-CM

## 2019-04-08 LAB — CBC WITH DIFFERENTIAL/PLATELET
Abs Immature Granulocytes: 0.03 10*3/uL (ref 0.00–0.07)
Basophils Absolute: 0 10*3/uL (ref 0.0–0.1)
Basophils Relative: 0 %
Eosinophils Absolute: 0.1 10*3/uL (ref 0.0–0.5)
Eosinophils Relative: 2 %
HCT: 37.6 % — ABNORMAL LOW (ref 39.0–52.0)
Hemoglobin: 11.8 g/dL — ABNORMAL LOW (ref 13.0–17.0)
Immature Granulocytes: 0 %
Lymphocytes Relative: 17 %
Lymphs Abs: 1.4 10*3/uL (ref 0.7–4.0)
MCH: 32 pg (ref 26.0–34.0)
MCHC: 31.4 g/dL (ref 30.0–36.0)
MCV: 101.9 fL — ABNORMAL HIGH (ref 80.0–100.0)
Monocytes Absolute: 0.8 10*3/uL (ref 0.1–1.0)
Monocytes Relative: 9 %
Neutro Abs: 5.8 10*3/uL (ref 1.7–7.7)
Neutrophils Relative %: 72 %
Platelets: 395 10*3/uL (ref 150–400)
RBC: 3.69 MIL/uL — ABNORMAL LOW (ref 4.22–5.81)
RDW: 14.6 % (ref 11.5–15.5)
WBC: 8.1 10*3/uL (ref 4.0–10.5)
nRBC: 0 % (ref 0.0–0.2)

## 2019-04-08 LAB — BASIC METABOLIC PANEL
Anion gap: 9 (ref 5–15)
BUN: 32 mg/dL — ABNORMAL HIGH (ref 8–23)
CO2: 26 mmol/L (ref 22–32)
Calcium: 10.1 mg/dL (ref 8.9–10.3)
Chloride: 117 mmol/L — ABNORMAL HIGH (ref 98–111)
Creatinine, Ser: 1.32 mg/dL — ABNORMAL HIGH (ref 0.61–1.24)
GFR calc Af Amer: 56 mL/min — ABNORMAL LOW (ref >60)
GFR calc non Af Amer: 49 mL/min — ABNORMAL LOW (ref >60)
Glucose, Bld: 147 mg/dL — ABNORMAL HIGH (ref 70–99)
Potassium: 3.7 mmol/L (ref 3.5–5.1)
Sodium: 152 mmol/L — ABNORMAL HIGH (ref 135–145)

## 2019-04-08 LAB — GLUCOSE, CAPILLARY
Glucose-Capillary: 110 mg/dL — ABNORMAL HIGH (ref 70–99)
Glucose-Capillary: 118 mg/dL — ABNORMAL HIGH (ref 70–99)
Glucose-Capillary: 124 mg/dL — ABNORMAL HIGH (ref 70–99)
Glucose-Capillary: 125 mg/dL — ABNORMAL HIGH (ref 70–99)
Glucose-Capillary: 131 mg/dL — ABNORMAL HIGH (ref 70–99)
Glucose-Capillary: 178 mg/dL — ABNORMAL HIGH (ref 70–99)

## 2019-04-08 MED ORDER — FREE WATER
200.0000 mL | Status: DC
  Administered 2019-04-08 – 2019-04-09 (×6): 200 mL

## 2019-04-08 NOTE — Progress Notes (Signed)
Patient's daughter at bedside.  Patient daughter has loosened one wrist restraint and insists on leaving it unfastened when she is at the bedside.  Daughter educated on patient's condition and behaviors.

## 2019-04-08 NOTE — Progress Notes (Signed)
Inpatient Rehabilitation-Admissions Coordinator   I have received insurance approval for admit to CIR. However, pt has had a decline since the case was open and pt does not appear to be appropriate for an admission to CIR at this time. I spoke with pt's wife about approval and current status. She is aware and is hopeful to have more clarification regarding next steps after she meets with palliative care today. TOC team aware.   Cheri Rous, OTR/L  Rehab Admissions Coordinator  [phone] 04/08/2019 2:04 PM

## 2019-04-08 NOTE — Progress Notes (Signed)
PROGRESS NOTE    Jonathan Berger  IPJ:[id number] DOB: 06/03/1931 DOA: 03/30/2019 PCP: [name], MD     Brief Narrative:  Jonathan Berger is a 64 yr oldmale with history of hypertension, who was admitted by Dr. [name] on 03/21/2019 for dense right ischemic MCA CVA, s/p TPA and VIR intervention with balloon angioplasty and stent placement at proximal R ICA. Pt developed right lower lobe aspiration pneumonitis after extubation on 2/11 and was later re-intubated on 2/15 and extubated on 2/17 . Pt was also found to have new systolic CHF with EF 73%. Pt had been managed in the ICU and transferred to the floor, TRH assumed care on 04/03/19.  Due to dysphagia, patient remains on tube feed via cortrak.  Palliative care involved in goals of care conversations.  New events last 24 hours / Subjective: Sitter at bedside, patient with intermittent agitation, trying to climb out of bed.  He has bilateral mittens in place as well as abdominal restraint for patient safety.  He is alert and awake, able to answer simple questions.  Admits that he has pain, unable to elaborate any further.  Assessment & Plan:   Principal Problem:   Middle cerebral artery embolism, right Active Problems:   Acute on chronic respiratory failure with hypoxia (HCC)   Systolic dysfunction   Dehydration   Aspiration pneumonia of right lower lobe due to gastric secretions (HCC)   Tachycardia   Hypoxia   Paroxysmal atrial fibrillation (HCC)   Multifocal atrial tachycardia (HCC)   Palliative care by specialist   Goals of care, counseling/discussion   DNR (do not resuscitate)   Muscular weakness   Dysphagia   Xerostomia   Delirium   Acute hypoxemic respiratory failure likely secondary to recent CVA/aspiration PNA/acute CHF -Status post intubation x2, recently extubated on 04/01/2019 -Completed cefepime and vancomycin x 7 days on 04/05/19 -Remains on room air  Right MCA infarct s/p tPA and L ICA stent placement -MRI  brain with right temporal lobe cortical/subcortical infarct with numerous small scattered R brain infarcts -CTA head and neck showed right MCA LVO mid and distal segment. Right ICA critical stenosis, left ICA distal bulb string sign, severe B VA origin stenosis -Repeat CT head on 04/06/2019 unremarkable -Echo showed EF 41%, grade 1 diastolic dysfunction -Carotid Doppler showed left ICA 60 to 79% stenosis -LDL 80, A1c 5.9 -Continue Brilinta, aspirin, Lipitor -PT/OT/SLP -?Plan for further left ICA intervention in the future  Acute systolic and diastolic HF Multifocal atrial tachycardia (MAT) -Echo showed EF of 93%, grade 1 diastolic dysfunction -Cardiology following, plan to repeat echo as an outpatient and consider ischemic evaluation if EF remains depressed -Continue metoprolol   Acute urinary retention -S/p foley catheter placement   Hypertension -Continue amlodipine. Valsartan-hydrochlorothiazide on hold  Dysphagia -Currently on tube feeds via cortrak and remains NPO  -SLP, dietitian on board  Dementia -Currently with delirium, likely ICU/hospital delirium -Continue Seroquel, continue Aricept, restraints PRN and sitter at bedside PRN   [place] -Patient with very poor prognosis -Palliative consulted for further goals of care  Hypernatremia -Increase free water flushes and monitor BMP     DVT prophylaxis: Lovenox  Code Status: DNR Family Communication: None at bedside Disposition Plan:  . Patient is from home prior to admission. Marland Kitchen Barrier(s) to discharge include continued tube feeding via cortrak and ongoing goals of care conversation.  Unclear disposition, if family decides to transition to comfort care, either anticipate in-hospital death versus residential hospice.  Patient remains with poor  prognosis overall.   Consultants:   Neurology  Critical care  Cardiology  Palliative care   Antimicrobials:  Anti-infectives (From admission, onward)   Start      Dose/Rate Route Frequency Ordered Stop   03/31/19 1100  vancomycin (VANCOCIN) IVPB 1000 mg/200 mL premix     1,000 mg 200 mL/hr over 60 Minutes Intravenous Every 24 hours 03/31/19 1027 04/04/19 1356   03/30/19 1000  ceFEPIme (MAXIPIME) 2 g in sodium chloride 0.9 % 100 mL IVPB     2 g 200 mL/hr over 30 Minutes Intravenous Every 12 hours 03/30/19 0806 04/04/19 2212   03/29/19 1230  vancomycin (VANCOREADY) IVPB 1750 mg/350 mL  Status:  Discontinued     1,750 mg 175 mL/hr over 120 Minutes Intravenous Every 48 hours 03/29/19 1141 03/31/19 1027   03/29/19 1200  ceFEPIme (MAXIPIME) 2 g in sodium chloride 0.9 % 100 mL IVPB  Status:  Discontinued     2 g 200 mL/hr over 30 Minutes Intravenous Every 24 hours 03/29/19 1129 03/30/19 0806   03/29/19 1130  vancomycin (VANCOCIN) IVPB 1000 mg/200 mL premix  Status:  Discontinued     1,000 mg 200 mL/hr over 60 Minutes Intravenous  Once 03/29/19 1129 03/29/19 1140   03/24/19 0930  ampicillin-sulbactam (UNASYN) 1.5 g in sodium chloride 0.9 % 100 mL IVPB  Status:  Discontinued     1.5 g 200 mL/hr over 30 Minutes Intravenous Every 8 hours 03/24/19 0852 03/29/19 1129   03/19/2019 1453  ceFAZolin (ANCEF) 2-4 GM/100ML-% IVPB    Note to Pharmacy: Novella Rob   : cabinet override      03/23/2019 1453 03/23/2019 1833        Objective: Vitals:   04/07/19 2347 04/08/19 0324 04/08/19 0725 04/08/19 1140  BP: 137/64 (!) 161/91 126/81 101/74  Pulse: 64 86 80 75  Resp: '19 20 20 20  ' Temp: 98 F (36.7 C) 97.7 F (36.5 C) 98.6 F (37 C) 98.5 F (36.9 C)  TempSrc: Oral  Oral Axillary  SpO2: 93%  96%   Weight:      Height:        Intake/Output Summary (Last 24 hours) at 04/08/2019 1238 Last data filed at 04/08/2019 1100 Gross per 24 hour  Intake 250 ml  Output 1025 ml  Net -775 ml   Filed Weights   04/01/19 0500 04/02/19 0500 04/07/19 0600  Weight: 82.6 kg 84.7 kg 86.2 kg    Examination:  General exam: Appears calm, in restraints, confused  Mouth: Tongue  very dry  Respiratory system: Clear to auscultation anteriorly without respiratory distress  Cardiovascular system: S1 & S2 heard Gastrointestinal system: Abdomen is nondistended, soft and nontender. +Cortrak  Central nervous system: Alert Extremities: Symmetric in appearance  Skin: No rashes, lesions or ulcers on exposed skin  Psychiatry: Intermittent confusion persists   Data Reviewed: I have personally reviewed following labs and imaging studies  CBC: Recent Labs  Lab 04/04/19 0331 04/05/19 0735 04/06/19 0403 04/07/19 0404 04/08/19 0627  WBC 11.0* 8.3 8.1 7.5 8.1  NEUTROABS 8.9* 6.5 5.6 5.2 5.8  HGB 11.4* 10.9* 11.2* 11.6* 11.8*  HCT 34.5* 34.3* 34.6* 36.4* 37.6*  MCV 97.7 101.5* 100.3* 100.8* 101.9*  PLT 253 286 313 355 950   Basic Metabolic Panel: Recent Labs  Lab 04/02/19 0250 04/02/19 0250 04/03/19 0439 04/03/19 0439 04/04/19 0331 04/05/19 0735 04/06/19 0403 04/07/19 0404 04/08/19 0627  NA 148*   < > 147*   < > 144 142 145 148* 152*  K 3.0*   < > 2.8*   < > 2.8* 3.4* 3.4* 3.5 3.7  CL 119*   < > 115*   < > 112* 110 109 114* 117*  CO2 21*   < > 23   < > '22 23 23 25 26  ' GLUCOSE 164*   < > 151*   < > 148* 135* 132* 156* 147*  BUN 29*   < > 19   < > 19 22 25* 25* 32*  CREATININE 1.04   < > 0.97   < > 1.03 1.13 1.16 1.31* 1.32*  CALCIUM 8.5*   < > 8.4*   < > 8.6* 8.9 9.3 9.6 10.1  MG 2.1  --  2.1  --   --   --   --   --   --   PHOS 1.7*  --  2.3*  --   --  2.5  --   --   --    < > = values in this interval not displayed.   GFR: Estimated Creatinine Clearance: 42.8 mL/min (A) (by C-G formula based on SCr of 1.32 mg/dL (H)). Liver Function Tests: No results for input(s): AST, ALT, ALKPHOS, BILITOT, PROT, ALBUMIN in the last 168 hours. No results for input(s): LIPASE, AMYLASE in the last 168 hours. No results for input(s): AMMONIA in the last 168 hours. Coagulation Profile: No results for input(s): INR, PROTIME in the last 168 hours. Cardiac Enzymes: No  results for input(s): CKTOTAL, CKMB, CKMBINDEX, TROPONINI in the last 168 hours. BNP (last 3 results) No results for input(s): PROBNP in the last 8760 hours. HbA1C: No results for input(s): HGBA1C in the last 72 hours. CBG: Recent Labs  Lab 04/07/19 2001 04/07/19 2345 04/08/19 0323 04/08/19 0721 04/08/19 1119  GLUCAP 118* 134* 110* 131* 178*   Lipid Profile: No results for input(s): CHOL, HDL, LDLCALC, TRIG, CHOLHDL, LDLDIRECT in the last 72 hours. Thyroid Function Tests: No results for input(s): TSH, T4TOTAL, FREET4, T3FREE, THYROIDAB in the last 72 hours. Anemia Panel: No results for input(s): VITAMINB12, FOLATE, FERRITIN, TIBC, IRON, RETICCTPCT in the last 72 hours. Sepsis Labs: No results for input(s): PROCALCITON, LATICACIDVEN in the last 168 hours.  Recent Results (from the past 240 hour(s))  Culture, respiratory (non-expectorated)     Status: None   Collection Time: 04/01/19  9:15 AM   Specimen: Tracheal Aspirate; Respiratory  Result Value Ref Range Status   Specimen Description TRACHEAL ASPIRATE  Final   Special Requests NONE  Final   Gram Stain   Final    FEW WBC PRESENT,BOTH PMN AND MONONUCLEAR RARE YEAST RARE GRAM POSITIVE COCCI IN CLUSTERS Performed at [facility], [place] [address] [place], [place] [address]    Culture   Final    FEW STAPHYLOCOCCUS EPIDERMIDIS FEW CANDIDA ALBICANS    Report Status 04/05/2019 FINAL  Final   Organism ID, Bacteria STAPHYLOCOCCUS EPIDERMIDIS  Final      Susceptibility   Staphylococcus epidermidis - MIC*    CIPROFLOXACIN <=0.5 SENSITIVE Sensitive     ERYTHROMYCIN >=8 RESISTANT Resistant     GENTAMICIN <=0.5 SENSITIVE Sensitive     OXACILLIN >=4 RESISTANT Resistant     TETRACYCLINE 2 SENSITIVE Sensitive     VANCOMYCIN 2 SENSITIVE Sensitive     TRIMETH/SULFA 160 RESISTANT Resistant     CLINDAMYCIN <=0.25 SENSITIVE Sensitive     RIFAMPIN <=0.5 SENSITIVE Sensitive     Inducible Clindamycin NEGATIVE Sensitive     * FEW  STAPHYLOCOCCUS  EPIDERMIDIS  C difficile quick scan w PCR reflex     Status: None   Collection Time: 04/04/19  1:21 PM   Specimen: STOOL  Result Value Ref Range Status   C Diff antigen NEGATIVE NEGATIVE Final   C Diff toxin NEGATIVE NEGATIVE Final   C Diff interpretation No C. difficile detected.  Final    Comment: Performed at [facility], [place] [address], [place], [place] [address]      Radiology Studies: CT HEAD WO CONTRAST  Result Date: 04/06/2019 CLINICAL DATA:  Patient continues to remain altered, evaluate for intracranial hemorrhage versus mass effect, stroke, follow-up. EXAM: CT HEAD WITHOUT CONTRAST TECHNIQUE: Contiguous axial images were obtained from the base of the skull through the vertex without intravenous contrast. COMPARISON:  Noncontrast head CT 03/30/2019, brain MRI 03/23/2019. FINDINGS: Brain: The examination is mildly motion degraded. A now subacute cortical/subcortical right temporal lobe infarct has not significantly changed in extent as compared to head CT 03/30/2019. No evidence of hemorrhagic conversion. No significant mass effect. No midline shift. Multiple known additional subacute right MCA territory infarcts were better appreciated on MRI 03/23/2019. No new demarcated cortical infarct is identified. No evidence of intracranial mass. No extra-axial fluid collection. Stable background generalized parenchymal atrophy and chronic small vessel ischemic disease. Vascular: Atherosclerotic calcifications. No suspicious intracranial vascular hyperdensity. Skull: Normal. Negative for fracture or focal lesion. Sinuses/Orbits: Visualized orbits demonstrate no acute abnormality. Scattered mucosal thickening greatest within the inferior right maxillary sinus. Partially visualized nasoenteric tube. IMPRESSION: Mildly motion degraded exam. A subacute right temporal lobe ischemic infarct has not significantly changed in extent from head CT 03/30/2019. No significant associated mass  effect. No hemorrhagic conversion. Additional small subacute right MCA infarcts were better appreciated on MRI 03/23/2019. No evidence of new acute intracranial abnormality. Electronically Signed   By: Kellie Simmering DO   On: 04/06/2019 19:59      Scheduled Meds: . amLODipine  5 mg Per Tube Daily  . aspirin  81 mg Per Tube Daily  . atorvastatin  40 mg Per Tube q1800  . chlorhexidine gluconate (MEDLINE KIT)  15 mL Mouth Rinse BID  . Chlorhexidine Gluconate Cloth  6 each Topical Daily  . donepezil  5 mg Per Tube QHS  . doxazosin  1 mg Per Tube Daily  . enoxaparin (LOVENOX) injection  40 mg Subcutaneous Q24H  . feeding supplement (PRO-STAT SUGAR FREE 64)  30 mL Per Tube TID  . folic acid  1 mg Per Tube Daily  . free water  200 mL Per Tube Q4H  . Gerhardt's butt cream   Topical BID  . mouth rinse  15 mL Mouth Rinse 10 times per day  . metoprolol tartrate  75 mg Per Tube BID  . pantoprazole sodium  40 mg Per Tube BID  . QUEtiapine  25 mg Oral QHS  . thiamine  100 mg Per Tube Daily  . ticagrelor  90 mg Oral BID   Or  . ticagrelor  90 mg Per Tube BID   Continuous Infusions: . sodium chloride    . sodium chloride Stopped (04/01/19 1509)  . feeding supplement (JEVITY 1.5 CAL/FIBER) 1,000 mL (04/08/19 0145)     LOS: 17 days      Time spent: 35 minutes   Dessa Phi, DO [facility] 04/08/2019, 12:38 PM   Available via Epic secure chat 7am-7pm After these hours, please refer to coverage provider listed on [email or web address]

## 2019-04-08 NOTE — Progress Notes (Signed)
Patient ID: Jonathan Berger, male   DOB: 1931-09-26, 86 y.o.   MRN: [id number]  This NP visited patient at the bedside as a follow up for palliative medicine needs, emotional support and to meet with patient's wife and daughter/[name] for continued conversation regarding current medical situation  Patient lying in bed restless.  Coretrak in place.  Patient requiring mitts and Posey for safety.  His mouth is dry, he is unable to follow commands and he appears generally uncomfortable  Patient continues to show no signs of meaningful improvement. Prognosis remains poor.   We discussed possible trajectories after a patient suffers a dense right ischemic MCA/CVA.  At this point in time we would be hoping for more signs of progress  We discussed his high risk for aspiration, delirium, infection and skin breakdown   We discussed concepts specific to human mortality and the limitations of medical interventions to prolong quality of life when the body begins to fail to thrive  Family understands the seriousness of the current medical situation but  they remain hopeful for improvement, "we need a little more time"   Plan -DNR/DNI -Family is open to all offered and available medical interventions to prolong life  -Daughter/[name] plans to be at the bedside today through Sunday, she will stay during visiting hours during the day.  She hopes that this time with her father will give her clearer insight into her father's condition and  prognosis       -Family tell me today that they do not believe that [name] would ever want a PEG/feeding tube       -Family tell me today that he would not want to live in current condition for any length of time -Ongoing GOC discussion with daughter/wife.   [name] is asking for palliative medicine to check in on Friday for further discussion regarding goals of care.    I shared with family my worry that the patient is not showing signs of improvement and this is a poor  prognostic indicator.  We discussed that  likely over the next several days they will be making decisions regarding treatment path; continued aggressive medical interventions versus palliative comfort path  I detailed the difference between an aggressive medical intervention path and a palliative comfort path for this patient at this time in this situation.  - If family makes the decision to transition care approach to full  Comfort at EOL care,  Prognosis would be days to less than two weeks  Questions and concerns addressed   Discussed with Dr  Alvino Chapel via secure chat  Total time spent on the unit was 60  minutes   Greater than 50% of the time was spent in counseling and coordination of care  [name] NP  Palliative Medicine Team Team Phone # [phone] Pager [phone]

## 2019-04-08 NOTE — Progress Notes (Signed)
  Speech Language Pathology Treatment: Dysphagia  Patient Details Name: Jonathan Berger MRN: [id number] DOB: 12/05/1931 Today's Date: 04/08/2019 Time: 1150-1208 SLP Time Calculation (min) (ACUTE ONLY): 18 min  Assessment / Plan / Recommendation Clinical Impression  Patient seen at bedside, Cortrak in place, breathing on RA. Patient very impulsive/restless, constantly attempting to re-position himself and get out of the bed. Requires constant re-direction. Mittens placed. ST attempted oral care: patient's oral cavity notable for thick, dried secretions on tongue, faucial pillars. ST able to remove a significant portion, but oral care difficult due to patient's impulsivity/difficulty following commands. Recommend oral care be completed at least 4x/day by staff. ST provided patient with ice chip: pt with discoordinated oral acceptance, allowing ice chip to melt on central sulcus of tongue, did not attempt to swallow, demonstrated decreased bolus awareness. In 2nd ice chip trial, patient with prolonged bolus holding, but did initiate to swallow, no overt s/sx aspiration. With sips of thin liquid water, patient with prolonged bolus holding/decreased awareness of bolus, immediate coughing x2. With small spoon sips of water, pt with prolonged AP transit, suspected delayed swallow initiation, but no overt s/sx aspiration x2. Patient's current mentation presents as a barrier for appropriateness to participate in objective swallowing study. ST will continue to follow for readiness. Recommend NPO with frequent oral care, patient may have ice chips or small amounts of water via spoon if sitting upright, alert. ST to follow as per POC.    HPI HPI: Pt is an 86 year old male presenting 2/7 with slurred speech and dysphagia, found to have acute R MCA stroke s/p tPA and EVR R MCA and ICA with stent. ETT 2/7-2/11. PMH: HTN. Intubated 2/7-2/11.      SLP Plan  Continue with current plan of care        Recommendations  Diet recommendations: NPO Liquids provided via: Teaspoon Medication Administration: Via alternative means Supervision: Full supervision/cueing for compensatory strategies Compensations: Minimize environmental distractions Postural Changes and/or Swallow Maneuvers: Seated upright 90 degrees                Oral Care Recommendations: Staff/trained caregiver to provide oral care;Oral care QID;Oral care prior to ice chip/H20 Follow up Recommendations: 24 hour supervision/assistance;Skilled Nursing facility SLP Visit Diagnosis: Dysphagia, unspecified (R13.10) Plan: Continue with current plan of care       GO                Jonathan Berger 04/08/2019, 12:21 PM  Jonathan Berger, M.Ed., CCC-SLP Speech Therapy Acute Rehabilitation [phone]: Acute Rehab office [phone] - pager

## 2019-04-08 NOTE — Progress Notes (Signed)
Progress Note  Patient Name: Jonathan Berger Date of Encounter: 04/08/2019  Primary Cardiologist: Jonathan Heinz, MD   Subjective   Agitated, not following commands  Inpatient Medications    Scheduled Meds: . amLODipine  5 mg Per Tube Daily  . aspirin  81 mg Per Tube Daily  . atorvastatin  40 mg Per Tube q1800  . chlorhexidine gluconate (MEDLINE KIT)  15 mL Mouth Rinse BID  . Chlorhexidine Gluconate Cloth  6 each Topical Daily  . donepezil  5 mg Per Tube QHS  . doxazosin  1 mg Per Tube Daily  . enoxaparin (LOVENOX) injection  40 mg Subcutaneous Q24H  . feeding supplement (PRO-STAT SUGAR FREE 64)  30 mL Per Tube TID  . folic acid  1 mg Per Tube Daily  . free water  200 mL Per Tube Q4H  . Gerhardt's butt cream   Topical BID  . mouth rinse  15 mL Mouth Rinse 10 times per day  . metoprolol tartrate  75 mg Per Tube BID  . pantoprazole sodium  40 mg Per Tube BID  . QUEtiapine  25 mg Oral QHS  . thiamine  100 mg Per Tube Daily  . ticagrelor  90 mg Oral BID   Or  . ticagrelor  90 mg Per Tube BID   Continuous Infusions: . sodium chloride    . sodium chloride Stopped (04/01/19 1509)  . feeding supplement (JEVITY 1.5 CAL/FIBER) 1,000 mL (04/08/19 0145)   PRN Meds: acetaminophen **OR** acetaminophen (TYLENOL) oral liquid 160 mg/5 mL **OR** acetaminophen, antiseptic oral rinse, bisacodyl, docusate sodium, levalbuterol, loperamide, LORazepam, metoprolol tartrate, nitroGLYCERIN, sodium chloride flush   Vital Signs    Vitals:   04/07/19 2347 04/08/19 0324 04/08/19 0725 04/08/19 1140  BP: 137/64 (!) 161/91 126/81 101/74  Pulse: 64 86 80 75  Resp: '19 20 20 20  ' Temp: 98 F (36.7 C) 97.7 F (36.5 C) 98.6 F (37 C) 98.5 F (36.9 C)  TempSrc: Oral  Oral Axillary  SpO2: 93%  96%   Weight:      Height:        Intake/Output Summary (Last 24 hours) at 04/08/2019 1235 Last data filed at 04/08/2019 1100 Gross per 24 hour  Intake 250 ml  Output 1025 ml  Net -775 ml    Filed Weights   04/01/19 0500 04/02/19 0500 04/07/19 0600  Weight: 82.6 kg 84.7 kg 86.2 kg    Telemetry    MAT, rate 70-80s- Personally Reviewed  ECG    No new tracings - Personally Reviewed  Physical Exam   GEN: agitated.   Neck: No JVD appreciated Cardiac: IRIR, no murmurs Respiratory: Clear to auscultation bilaterally anteriorly GI: Soft, nontender MS: No edema; No deformity. Neuro:  Confused, moving all extremities spontaneously Psych: Agitated.   Labs    Chemistry Recent Labs  Lab 04/06/19 0403 04/07/19 0404 04/08/19 0627  NA 145 148* 152*  K 3.4* 3.5 3.7  CL 109 114* 117*  CO2 '23 25 26  ' GLUCOSE 132* 156* 147*  BUN 25* 25* 32*  CREATININE 1.16 1.31* 1.32*  CALCIUM 9.3 9.6 10.1  GFRNONAA 57* 49* 49*  GFRAA >60 57* 56*  ANIONGAP '13 9 9     ' Hematology Recent Labs  Lab 04/06/19 0403 04/07/19 0404 04/08/19 0627  WBC 8.1 7.5 8.1  RBC 3.45* 3.61* 3.69*  HGB 11.2* 11.6* 11.8*  HCT 34.6* 36.4* 37.6*  MCV 100.3* 100.8* 101.9*  MCH 32.5 32.1 32.0  MCHC 32.4 31.9  31.4  RDW 14.6 14.6 14.6  PLT 313 355 395    Cardiac EnzymesNo results for input(s): TROPONINI in the last 168 hours. No results for input(s): TROPIPOC in the last 168 hours.   BNPNo results for input(s): BNP, PROBNP in the last 168 hours.   DDimer No results for input(s): DDIMER in the last 168 hours.   Radiology    CT HEAD WO CONTRAST  Result Date: 04/06/2019 CLINICAL DATA:  Patient continues to remain altered, evaluate for intracranial hemorrhage versus mass effect, stroke, follow-up. EXAM: CT HEAD WITHOUT CONTRAST TECHNIQUE: Contiguous axial images were obtained from the base of the skull through the vertex without intravenous contrast. COMPARISON:  Noncontrast head CT 03/30/2019, brain MRI 03/23/2019. FINDINGS: Brain: The examination is mildly motion degraded. A now subacute cortical/subcortical right temporal lobe infarct has not significantly changed in extent as compared to head CT  03/30/2019. No evidence of hemorrhagic conversion. No significant mass effect. No midline shift. Multiple known additional subacute right MCA territory infarcts were better appreciated on MRI 03/23/2019. No new demarcated cortical infarct is identified. No evidence of intracranial mass. No extra-axial fluid collection. Stable background generalized parenchymal atrophy and chronic small vessel ischemic disease. Vascular: Atherosclerotic calcifications. No suspicious intracranial vascular hyperdensity. Skull: Normal. Negative for fracture or focal lesion. Sinuses/Orbits: Visualized orbits demonstrate no acute abnormality. Scattered mucosal thickening greatest within the inferior right maxillary sinus. Partially visualized nasoenteric tube. IMPRESSION: Mildly motion degraded exam. A subacute right temporal lobe ischemic infarct has not significantly changed in extent from head CT 03/30/2019. No significant associated mass effect. No hemorrhagic conversion. Additional small subacute right MCA infarcts were better appreciated on MRI 03/23/2019. No evidence of new acute intracranial abnormality. Electronically Signed   By: Kellie Simmering DO   On: 04/06/2019 19:59    Cardiac Studies   Echo 03/23/19: IMPRESSIONS    1. Left ventricular ejection fraction, by estimation, is 45%. The left  ventricle has mildly decreased function. The left ventrical demonstrates  regional wall motion abnormalities (see scoring diagram/findings for  description). Akinesis of the mid to  apical anteroseptal and inferoseptal walls. Left ventricular diastolic  parameters are consistent with Grade I diastolic dysfunction (impaired  relaxation).  2. Right ventricular systolic function is mildly reduced. The right  ventricular size is mildly enlarged. There is mildly elevated pulmonary  artery systolic pressure. The estimated right ventricular systolic  pressure is 50.5 mmHg.  3. The aortic valve is tricuspid. Trivial aortic  insufficiency, no aortic  stenosis.  4. No significant mitral regurgitation.  5. Dilated IVC with estimated RA pressure 15 mmHg.    Patient Profile     [name] is an 86M with hypertension, dementia, and prior tobacco abuse admitted with R ICA stenosis and stroke.   Assessment & Plan    1. MAT: noted on telemetry this admission. C/f possible Afib given recent stroke though careful review of telemetry did not reveal clear Afib. Additionally, triple therapy risks would outweigh benefits at this time.  - Continue metoprolol 23m BID   2. Acute combined CHF: Echo this admission revealed EF 45%. No clear anginal complaints. No known history of CAD. Possibly related to #1. CXR overnight suggests worsening of central vascular congestion. Pulmonary exam limited by patient cooperation, though no obvious w/r/r - Continue to dose lasix as needed - Can repeat echo in 3 months - if no improvement in EF, would then consider an ischemic evaluation if in line with patient's/family's GOC.   3. CVA: patient presented  with acute R MCA stroke, s/p TPA and stenting of the R ICA. Now on aspirin and brilinta.  - Continue management per primary team/neurology  4. HTN: BP stable today. Home valsartan/HCTZ on hold - Continue metoprolol, amlodipine, and doxazosin  5. Acute hypoxic respiratory failure: 2/2 PNA. He has been intubated twice this admission. Last extubated 2/17. O2 sat stable on RA. Pulmonary exam limited by patient cooperation - Continue management per primary team.   6. Goals of care: agree with ongoing goals of care discussion   [facility] will sign off.   Medication Recommendations:  Continue metoprolol 75 mg BID Other recommendations (labs, testing, etc): None Follow up as an outpatient:  Scheduled if in line with patient's/family's GOC   For questions or updates, please contact [place] Please consult [email or web address] for contact info under Cardiology/STEMI.       Signed, Jonathan Heinz, MD

## 2019-04-09 LAB — GLUCOSE, CAPILLARY
Glucose-Capillary: 118 mg/dL — ABNORMAL HIGH (ref 70–99)
Glucose-Capillary: 145 mg/dL — ABNORMAL HIGH (ref 70–99)
Glucose-Capillary: 118 mg/dL — ABNORMAL HIGH (ref 70–99)
Glucose-Capillary: 144 mg/dL — ABNORMAL HIGH (ref 70–99)
Glucose-Capillary: 131 mg/dL — ABNORMAL HIGH (ref 70–99)
Glucose-Capillary: 122 mg/dL — ABNORMAL HIGH (ref 70–99)
Glucose-Capillary: 120 mg/dL — ABNORMAL HIGH (ref 70–99)

## 2019-04-09 LAB — BASIC METABOLIC PANEL
Anion gap: 12 (ref 5–15)
BUN: 37 mg/dL — ABNORMAL HIGH (ref 8–23)
CO2: 24 mmol/L (ref 22–32)
Calcium: 9.8 mg/dL (ref 8.9–10.3)
Chloride: 117 mmol/L — ABNORMAL HIGH (ref 98–111)
Creatinine, Ser: 1.42 mg/dL — ABNORMAL HIGH (ref 0.61–1.24)
GFR calc Af Amer: 51 mL/min — ABNORMAL LOW (ref >60)
GFR calc non Af Amer: 44 mL/min — ABNORMAL LOW (ref >60)
Glucose, Bld: 119 mg/dL — ABNORMAL HIGH (ref 70–99)
Potassium: 3.8 mmol/L (ref 3.5–5.1)
Sodium: 153 mmol/L — ABNORMAL HIGH (ref 135–145)

## 2019-04-09 MED ORDER — INSULIN ASPART 100 UNIT/ML ~~LOC~~ SOLN
0.0000 [IU] | SUBCUTANEOUS | Status: DC
  Administered 2019-04-09 – 2019-04-12 (×12): 1 [IU] via SUBCUTANEOUS
  Administered 2019-04-12: 2 [IU] via SUBCUTANEOUS
  Administered 2019-04-12 – 2019-04-15 (×9): 1 [IU] via SUBCUTANEOUS
  Administered 2019-04-15: 12:00:00 2 [IU] via SUBCUTANEOUS
  Administered 2019-04-16: 1 [IU] via SUBCUTANEOUS
  Administered 2019-04-16: 2 [IU] via SUBCUTANEOUS
  Administered 2019-04-16: 3 [IU] via SUBCUTANEOUS
  Administered 2019-04-16 (×2): 1 [IU] via SUBCUTANEOUS

## 2019-04-09 MED ORDER — DEXTROSE 10 % IV SOLN
INTRAVENOUS | Status: DC
  Filled 2019-04-09: qty 1000

## 2019-04-09 MED ORDER — FREE WATER
200.0000 mL | Freq: Four times a day (QID) | Status: DC
  Administered 2019-04-09 – 2019-04-16 (×24): 200 mL

## 2019-04-09 NOTE — Progress Notes (Signed)
  Speech Language Pathology Treatment: Dysphagia  Patient Details Name: Jonathan Berger MRN: [id number] DOB: [birth year] Today's Date: 04/09/2019 Time: 2706-2376 SLP Time Calculation (min) (ACUTE ONLY): 25 min  Assessment / Plan / Recommendation Clinical Impression  Pt was seen for skilled ST targeting dysphagia and PO readiness.  Pt's daughter was present for this tx session.  She reported that she was hopeful that the pt would be able to safely swallow food and drinks prior to discharge.  Pt was lethargic throughout this session with his eyes remaining closed for the majority of the session.  He exhibited dysarthria during communication attempts and he was unable to follow most commands despite mod-max cues.  Pt communicated best with his daughter.  Daughter reported that pt enjoyed Pepsi and a similar dark soda was presented via toothette to the pt's lingual surface to encourage pt for lingual manipulation and labial closure.  Labial closure was attempted, but he was not able to achieve a full labial seal.  Similarly, minimal lingual manipulation was observed.  Oral care was provided and ice chip and thin liquid trials via tsp were attempted.  Pt continued to exhibit reduced labial closure and lingual manipulation with all trials.  He accepted trials passively via tsp and oral holding was observed.  Swallow initiation was not observed with ice chip trials, but he consistently triggered a swallow with thin liquid trials.  Suspect that swallow initiation was delayed, and an immediate cough was noted following all thin liquid trials in addition to wet vocal quality.  Oral gel was applied to the pt's lingual and labial surfaces following PO trials.  Recommend continuation of NPO with alternative means of nutrition and  frequent oral care (at least 4x per day).  Pt may have a few small ice chips or 1/2 tsp of water given full RN supervision following thorough oral care.  Spoke with daughter regarding all  recommendations and she verbalized understanding.  SLP will f/u per POC.     HPI HPI: Pt is an 86 year old male presenting 2/7 with slurred speech and dysphagia, found to have acute R MCA stroke s/p tPA and EVR R MCA and ICA with stent. ETT 2/7-2/11. PMH: HTN. Intubated 2/7-2/11.      SLP Plan  Continue with current plan of care       Recommendations  Diet recommendations: NPO Liquids provided via: Teaspoon Medication Administration: Via alternative means Supervision: Full supervision/cueing for compensatory strategies Compensations: Minimize environmental distractions Postural Changes and/or Swallow Maneuvers: Seated upright 90 degrees                Oral Care Recommendations: Staff/trained caregiver to provide oral care;Oral care QID;Oral care prior to ice chip/H20 Follow up Recommendations: 24 hour supervision/assistance;Skilled Nursing facility SLP Visit Diagnosis: Dysphagia, unspecified (R13.10) Plan: Continue with current plan of care                      Villa Herb M.S., CCC-SLP Acute Rehabilitation Services Office: [phone]  Shanon Rosser Chanya Chrisley 04/09/2019, 12:30 PM

## 2019-04-09 NOTE — Plan of Care (Signed)
  Problem: Education: Goal: Knowledge of disease or condition will improve Outcome: Progressing Goal: Knowledge of secondary prevention will improve Outcome: Progressing Goal: Knowledge of patient specific risk factors addressed and post discharge goals established will improve Outcome: Progressing Goal: Individualized Educational Video(s) Outcome: Progressing   Problem: Coping: Goal: Will verbalize positive feelings about self Outcome: Progressing Goal: Will identify appropriate support needs Outcome: Progressing   Problem: Health Behavior/Discharge Planning: Goal: Ability to manage health-related needs will improve Outcome: Progressing   Problem: Self-Care: Goal: Verbalization of feelings and concerns over difficulty with self-care will improve Outcome: Progressing Goal: Ability to communicate needs accurately will improve Outcome: Progressing   Problem: Nutrition: Goal: Risk of aspiration will decrease Outcome: Progressing Goal: Dietary intake will improve Outcome: Progressing   Problem: Ischemic Stroke/TIA Tissue Perfusion: Goal: Complications of ischemic stroke/TIA will be minimized Outcome: Progressing   Problem: Education: Goal: Knowledge of General Education information will improve Description: Including pain rating scale, medication(s)/side effects and non-pharmacologic comfort measures Outcome: Progressing   Problem: Health Behavior/Discharge Planning: Goal: Ability to manage health-related needs will improve Outcome: Progressing   Problem: Clinical Measurements: Goal: Ability to maintain clinical measurements within normal limits will improve Outcome: Progressing Goal: Will remain free from infection Outcome: Progressing Goal: Diagnostic test results will improve Outcome: Progressing Goal: Respiratory complications will improve Outcome: Progressing Goal: Cardiovascular complication will be avoided Outcome: Progressing   Problem: Activity: Goal:  Risk for activity intolerance will decrease Outcome: Progressing   Problem: Nutrition: Goal: Adequate nutrition will be maintained Outcome: Progressing   Problem: Coping: Goal: Level of anxiety will decrease Outcome: Progressing   Problem: Elimination: Goal: Will not experience complications related to bowel motility Outcome: Progressing Goal: Will not experience complications related to urinary retention Outcome: Progressing   Problem: Pain Managment: Goal: General experience of comfort will improve Outcome: Progressing   Problem: Safety: Goal: Ability to remain free from injury will improve Outcome: Progressing   Problem: Skin Integrity: Goal: Risk for impaired skin integrity will decrease Outcome: Progressing   

## 2019-04-09 NOTE — Progress Notes (Signed)
1:1 sitter not at the bedside, patient is sleeping.

## 2019-04-09 NOTE — Progress Notes (Signed)
PROGRESS NOTE    Jonathan Berger  BBC:[id number] DOB: 1932-01-15 DOA: 04/10/2019 PCP: [name], MD     Brief Narrative:  Jonathan Berger is a 79 yr oldmale with history of hypertension, who was admitted by Dr. [name] on 03/26/2019 for dense right ischemic MCA CVA, s/p TPA and VIR intervention with balloon angioplasty and stent placement at proximal R ICA. Pt developed right lower lobe aspiration pneumonitis after extubation on 2/11 and was later re-intubated on 2/15 and extubated on 2/17 . Pt was also found to have new systolic CHF with EF 50%. Pt had been managed in the ICU and transferred to the floor, TRH assumed care on 04/03/19.  Due to dysphagia, patient remains on tube feed via cortrak.  Palliative care involved in goals of care conversations.  New events last 24 hours / Subjective: Required ativan overnight due to agitation.   Assessment & Plan:   Principal Problem:   Middle cerebral artery embolism, right Active Problems:   Acute on chronic respiratory failure with hypoxia (HCC)   Systolic dysfunction   Dehydration   Aspiration pneumonia of right lower lobe due to gastric secretions (HCC)   Tachycardia   Hypoxia   Paroxysmal atrial fibrillation (HCC)   Multifocal atrial tachycardia (HCC)   Palliative care by specialist   Goals of care, counseling/discussion   DNR (do not resuscitate)   Muscular weakness   Dysphagia   Xerostomia   Delirium   Acute hypoxemic respiratory failure likely secondary to recent CVA/aspiration PNA/acute CHF -Status post intubation x2, recently extubated on 04/01/2019 -Completed cefepime and vancomycin x 7 days on 04/05/19 -Remains on room air  Right MCA infarct s/p tPA and L ICA stent placement -MRI brain with right temporal lobe cortical/subcortical infarct with numerous small scattered R brain infarcts -CTA head and neck showed right MCA LVO mid and distal segment. Right ICA critical stenosis, left ICA distal bulb string sign, severe B  VA origin stenosis -Repeat CT head on 04/06/2019 unremarkable -Echo showed EF 38%, grade 1 diastolic dysfunction -Carotid Doppler showed left ICA 60 to 79% stenosis -LDL 80, A1c 5.9 -Continue Brilinta, aspirin, Lipitor -PT/OT/SLP -?Plan for further left ICA intervention in the future  Acute systolic and diastolic HF Multifocal atrial tachycardia (MAT) -Echo showed EF of 88%, grade 1 diastolic dysfunction -Cardiology following, plan to repeat echo as an outpatient and consider ischemic evaluation if EF remains depressed -Continue metoprolol   Acute urinary retention -S/p foley catheter placement   Hypertension -Continue amlodipine. Valsartan-hydrochlorothiazide on hold  Dysphagia -Currently on tube feeds via cortrak and remains NPO  -SLP, dietitian on board  Dementia -Currently with delirium, likely ICU/hospital delirium -Continue Seroquel, continue Aricept, restraints PRN and sitter at bedside PRN   [place] -Patient with very poor prognosis -Palliative consulted for further goals of care  Hypernatremia -Start D10 today, monitor BMP     DVT prophylaxis: Lovenox  Code Status: DNR Family Communication: None at bedside Disposition Plan:  . Patient is from home prior to admission. Marland Kitchen Barrier(s) to discharge include continued tube feeding via cortrak and ongoing goals of care conversation.  Unclear disposition, if family decides to transition to comfort care, either anticipate in-hospital death versus residential hospice.  Patient remains with poor prognosis overall.   Consultants:   Neurology  Critical care  Cardiology  Palliative care   Antimicrobials:  Anti-infectives (From admission, onward)   Start     Dose/Rate Route Frequency Ordered Stop   03/31/19 1100  vancomycin (VANCOCIN) IVPB 1000  mg/200 mL premix     1,000 mg 200 mL/hr over 60 Minutes Intravenous Every 24 hours 03/31/19 1027 04/04/19 1356   03/30/19 1000  ceFEPIme (MAXIPIME) 2 g in sodium  chloride 0.9 % 100 mL IVPB     2 g 200 mL/hr over 30 Minutes Intravenous Every 12 hours 03/30/19 0806 04/04/19 2212   03/29/19 1230  vancomycin (VANCOREADY) IVPB 1750 mg/350 mL  Status:  Discontinued     1,750 mg 175 mL/hr over 120 Minutes Intravenous Every 48 hours 03/29/19 1141 03/31/19 1027   03/29/19 1200  ceFEPIme (MAXIPIME) 2 g in sodium chloride 0.9 % 100 mL IVPB  Status:  Discontinued     2 g 200 mL/hr over 30 Minutes Intravenous Every 24 hours 03/29/19 1129 03/30/19 0806   03/29/19 1130  vancomycin (VANCOCIN) IVPB 1000 mg/200 mL premix  Status:  Discontinued     1,000 mg 200 mL/hr over 60 Minutes Intravenous  Once 03/29/19 1129 03/29/19 1140   03/24/19 0930  ampicillin-sulbactam (UNASYN) 1.5 g in sodium chloride 0.9 % 100 mL IVPB  Status:  Discontinued     1.5 g 200 mL/hr over 30 Minutes Intravenous Every 8 hours 03/24/19 0852 03/29/19 1129   04/01/2019 1453  ceFAZolin (ANCEF) 2-4 GM/100ML-% IVPB    Note to Pharmacy: Novella Rob   : cabinet override      03/21/2019 1453 03/19/2019 1833       Objective: Vitals:   04/09/19 0000 04/09/19 0401 04/09/19 0809 04/09/19 1155  BP: (!) 144/71 137/81 136/81 (!) 113/94  Pulse: 91 98 75 65  Resp: '20 18 20 20  ' Temp: 98.5 F (36.9 C) 98.3 F (36.8 C) 97.7 F (36.5 C) 97.8 F (36.6 C)  TempSrc: Oral Oral Oral Oral  SpO2: 99% 98% 99% 98%  Weight:  86.2 kg    Height:        Intake/Output Summary (Last 24 hours) at 04/09/2019 1216 Last data filed at 04/09/2019 0402 Gross per 24 hour  Intake 200 ml  Output 1250 ml  Net -1050 ml   Filed Weights   04/02/19 0500 04/07/19 0600 04/09/19 0401  Weight: 84.7 kg 86.2 kg 86.2 kg    Examination: General exam: Appears confused, in bilateral and abdominal restraints Mucosa: Very dry  Respiratory system: Clear to auscultation anteriorly Cardiovascular system: S1 & S2 heard, RRR. No pedal edema. Gastrointestinal system: Abdomen is nondistended, soft, +cortrak Central nervous system: Alert   Extremities: Symmetric in appearance bilaterally    Data Reviewed: I have personally reviewed following labs and imaging studies  CBC: Recent Labs  Lab 04/04/19 0331 04/05/19 0735 04/06/19 0403 04/07/19 0404 04/08/19 0627  WBC 11.0* 8.3 8.1 7.5 8.1  NEUTROABS 8.9* 6.5 5.6 5.2 5.8  HGB 11.4* 10.9* 11.2* 11.6* 11.8*  HCT 34.5* 34.3* 34.6* 36.4* 37.6*  MCV 97.7 101.5* 100.3* 100.8* 101.9*  PLT 253 286 313 355 160   Basic Metabolic Panel: Recent Labs  Lab 04/03/19 0439 04/04/19 0331 04/05/19 0735 04/06/19 0403 04/07/19 0404 04/08/19 0627 04/09/19 0106  NA 147*   < > 142 145 148* 152* 153*  K 2.8*   < > 3.4* 3.4* 3.5 3.7 3.8  CL 115*   < > 110 109 114* 117* 117*  CO2 23   < > '23 23 25 26 24  ' GLUCOSE 151*   < > 135* 132* 156* 147* 119*  BUN 19   < > 22 25* 25* 32* 37*  CREATININE 0.97   < > 1.13 1.16  1.31* 1.32* 1.42*  CALCIUM 8.4*   < > 8.9 9.3 9.6 10.1 9.8  MG 2.1  --   --   --   --   --   --   PHOS 2.3*  --  2.5  --   --   --   --    < > = values in this interval not displayed.   GFR: Estimated Creatinine Clearance: 39.8 mL/min (A) (by C-G formula based on SCr of 1.42 mg/dL (H)). Liver Function Tests: No results for input(s): AST, ALT, ALKPHOS, BILITOT, PROT, ALBUMIN in the last 168 hours. No results for input(s): LIPASE, AMYLASE in the last 168 hours. No results for input(s): AMMONIA in the last 168 hours. Coagulation Profile: No results for input(s): INR, PROTIME in the last 168 hours. Cardiac Enzymes: No results for input(s): CKTOTAL, CKMB, CKMBINDEX, TROPONINI in the last 168 hours. BNP (last 3 results) No results for input(s): PROBNP in the last 8760 hours. HbA1C: No results for input(s): HGBA1C in the last 72 hours. CBG: Recent Labs  Lab 04/08/19 2029 04/08/19 2358 04/09/19 0358 04/09/19 0827 04/09/19 1153  GLUCAP 124* 125* 118* 144* 131*   Lipid Profile: No results for input(s): CHOL, HDL, LDLCALC, TRIG, CHOLHDL, LDLDIRECT in the last 72 hours.  Thyroid Function Tests: No results for input(s): TSH, T4TOTAL, FREET4, T3FREE, THYROIDAB in the last 72 hours. Anemia Panel: No results for input(s): VITAMINB12, FOLATE, FERRITIN, TIBC, IRON, RETICCTPCT in the last 72 hours. Sepsis Labs: No results for input(s): PROCALCITON, LATICACIDVEN in the last 168 hours.  Recent Results (from the past 240 hour(s))  Culture, respiratory (non-expectorated)     Status: None   Collection Time: 04/01/19  9:15 AM   Specimen: Tracheal Aspirate; Respiratory  Result Value Ref Range Status   Specimen Description TRACHEAL ASPIRATE  Final   Special Requests NONE  Final   Gram Stain   Final    FEW WBC PRESENT,BOTH PMN AND MONONUCLEAR RARE YEAST RARE GRAM POSITIVE COCCI IN CLUSTERS Performed at [facility], [place] [address], [place], [place] [address]    Culture   Final    FEW STAPHYLOCOCCUS EPIDERMIDIS FEW CANDIDA ALBICANS    Report Status 04/05/2019 FINAL  Final   Organism ID, Bacteria STAPHYLOCOCCUS EPIDERMIDIS  Final      Susceptibility   Staphylococcus epidermidis - MIC*    CIPROFLOXACIN <=0.5 SENSITIVE Sensitive     ERYTHROMYCIN >=8 RESISTANT Resistant     GENTAMICIN <=0.5 SENSITIVE Sensitive     OXACILLIN >=4 RESISTANT Resistant     TETRACYCLINE 2 SENSITIVE Sensitive     VANCOMYCIN 2 SENSITIVE Sensitive     TRIMETH/SULFA 160 RESISTANT Resistant     CLINDAMYCIN <=0.25 SENSITIVE Sensitive     RIFAMPIN <=0.5 SENSITIVE Sensitive     Inducible Clindamycin NEGATIVE Sensitive     * FEW STAPHYLOCOCCUS EPIDERMIDIS  C difficile quick scan w PCR reflex     Status: None   Collection Time: 04/04/19  1:21 PM   Specimen: STOOL  Result Value Ref Range Status   C Diff antigen NEGATIVE NEGATIVE Final   C Diff toxin NEGATIVE NEGATIVE Final   C Diff interpretation No C. difficile detected.  Final    Comment: Performed at [facility], [place] [address], [place], [place] [address]      Radiology Studies: No results found.    Scheduled Meds:  . amLODipine  5 mg Per Tube Daily  . aspirin  81 mg Per Tube Daily  .  atorvastatin  40 mg Per Tube q1800  . chlorhexidine gluconate (MEDLINE KIT)  15 mL Mouth Rinse BID  . Chlorhexidine Gluconate Cloth  6 each Topical Daily  . donepezil  5 mg Per Tube QHS  . doxazosin  1 mg Per Tube Daily  . enoxaparin (LOVENOX) injection  40 mg Subcutaneous Q24H  . feeding supplement (PRO-STAT SUGAR FREE 64)  30 mL Per Tube TID  . folic acid  1 mg Per Tube Daily  . free water  200 mL Per Tube Q6H  . Gerhardt's butt cream   Topical BID  . insulin aspart  0-9 Units Subcutaneous Q4H  . mouth rinse  15 mL Mouth Rinse 10 times per day  . metoprolol tartrate  75 mg Per Tube BID  . pantoprazole sodium  40 mg Per Tube BID  . QUEtiapine  25 mg Oral QHS  . thiamine  100 mg Per Tube Daily  . ticagrelor  90 mg Oral BID   Or  . ticagrelor  90 mg Per Tube BID   Continuous Infusions: . sodium chloride    . sodium chloride Stopped (04/01/19 1509)  . dextrose Stopped (04/09/19 1134)  . feeding supplement (JEVITY 1.5 CAL/FIBER) 1,000 mL (04/08/19 0145)     LOS: 18 days      Time spent: 20 minutes   Dessa Phi, DO [facility] 04/09/2019, 12:16 PM   Available via Epic secure chat 7am-7pm After these hours, please refer to coverage provider listed on [email or web address]

## 2019-04-10 LAB — BASIC METABOLIC PANEL
Anion gap: 8 (ref 5–15)
BUN: 37 mg/dL — ABNORMAL HIGH (ref 8–23)
CO2: 26 mmol/L (ref 22–32)
Calcium: 9.6 mg/dL (ref 8.9–10.3)
Chloride: 114 mmol/L — ABNORMAL HIGH (ref 98–111)
Creatinine, Ser: 1.31 mg/dL — ABNORMAL HIGH (ref 0.61–1.24)
GFR calc Af Amer: 57 mL/min — ABNORMAL LOW (ref >60)
GFR calc non Af Amer: 49 mL/min — ABNORMAL LOW (ref >60)
Glucose, Bld: 151 mg/dL — ABNORMAL HIGH (ref 70–99)
Potassium: 3.3 mmol/L — ABNORMAL LOW (ref 3.5–5.1)
Sodium: 148 mmol/L — ABNORMAL HIGH (ref 135–145)

## 2019-04-10 LAB — GLUCOSE, CAPILLARY
Glucose-Capillary: 105 mg/dL — ABNORMAL HIGH (ref 70–99)
Glucose-Capillary: 120 mg/dL — ABNORMAL HIGH (ref 70–99)
Glucose-Capillary: 131 mg/dL — ABNORMAL HIGH (ref 70–99)
Glucose-Capillary: 132 mg/dL — ABNORMAL HIGH (ref 70–99)
Glucose-Capillary: 132 mg/dL — ABNORMAL HIGH (ref 70–99)
Glucose-Capillary: 141 mg/dL — ABNORMAL HIGH (ref 70–99)

## 2019-04-10 MED ORDER — POTASSIUM CHLORIDE 20 MEQ/15ML (10%) PO SOLN
40.0000 meq | Freq: Once | ORAL | Status: AC
  Administered 2019-04-10: 40 meq
  Filled 2019-04-10: qty 30

## 2019-04-10 MED ORDER — CHLORHEXIDINE GLUCONATE 0.12 % MT SOLN
OROMUCOSAL | Status: AC
  Administered 2019-04-10: 15 mL
  Filled 2019-04-10: qty 15

## 2019-04-10 NOTE — Progress Notes (Signed)
Oral care performed.

## 2019-04-10 NOTE — Progress Notes (Signed)
Physical Therapy Treatment Patient Details Name: Jonathan Berger MRN: [id number] DOB: [birth year] Today's Date: 04/10/2019    History of Present Illness 86 year old presenting 2/7 with slurred speech and dysphagia found to have acute right middle cerebral artery stroke s/p tPA and EVR R MCA and ICA with stent on 04-05-2019.  Extubated 2/11, reintubated 2/15-2/17.    PT Comments    Pt in bed with family and sitter present upon arrival of PT, agreeable to PT session with focus on progressing mobility. The pt continues to present with limitations in functional mobility, stability, and cognition compared to his prior level of function and independence due to above dx. The pt was able to demo sig improvements in assist needed to transfer and ambulate in room. He was able to complete short ambulation in room with minG and RW as well as multiple sit-stand transfers with minG and RW. The pt's family expressed interest in progressing d/c plan to home with HHPT and based on today's session this is more realistic, but due to ongoing cog deficits/restlessness the pt will need strict 24/7 supervision. PT will continue to follow acutely to progress stability and independence with mobility.    Follow Up Recommendations  Supervision/Assistance - 24 hour;SNF(SNF vs Home with HHPT depending on family availability for 24/7 supervision and assist. per pt's daughter, family is wanting him to return home.)     Equipment Recommendations  Other (comment)(to be determined)    Recommendations for Other Services       Precautions / Restrictions Precautions Precautions: Fall Precaution Comments: Cortrak, restraints, mitts, posey Restrictions Weight Bearing Restrictions: No    Mobility  Bed Mobility Overal bed mobility: Needs Assistance Bed Mobility: Supine to Sit;Sit to Supine     Supine to sit: +2 for physical assistance;Mod assist Sit to supine: Mod assist;+2 for physical assistance   General bed mobility  comments: pt able to come to sitting with modA for BLE movement to EOB and modA to raise trunk as pt with poor command following at this point in session. Pt then required multiple cues and hands on assist to encourage return to bed  Transfers Overall transfer level: Needs assistance Equipment used: Rolling walker (2 wheeled) Transfers: Sit to/from Stand Sit to Stand: Min guard         General transfer comment: pt able to initiate stand with minG for safety, completed stand x4 through session without physical assist to lift.  Ambulation/Gait Ambulation/Gait assistance: Min guard Gait Distance (Feet): 15 Feet Assistive device: Rolling walker (2 wheeled) Gait Pattern/deviations: Step-to pattern;Decreased stride length Gait velocity: decr Gait velocity interpretation: <1.8 ft/sec, indicate of risk for recurrent falls General Gait Details: pt with sig improved stability, no LOB, no instances of knee buckling, good control of RW   Stairs             Wheelchair Mobility    Modified Rankin (Stroke Patients Only) Modified Rankin (Stroke Patients Only) Pre-Morbid Rankin Score: No symptoms Modified Rankin: Moderately severe disability     Balance Overall balance assessment: Needs assistance Sitting-balance support: No upper extremity supported;Feet supported Sitting balance-Leahy Scale: Good Sitting balance - Comments: pt able to static sit without UE support   Standing balance support: Bilateral upper extremity supported;During functional activity Standing balance-Leahy Scale: Poor Standing balance comment: reliant on BUE support                            Cognition Arousal/Alertness: Awake/alert(initially lethargic, very awake/alert  with mobility) Behavior During Therapy: Restless;WFL for tasks assessed/performed(WFL during mobility, restless once returned to bed) Overall Cognitive Status: Impaired/Different from baseline Area of Impairment: Following  commands;Safety/judgement;Awareness;Problem solving                 Orientation Level: Disoriented to;Time;Situation;Place Current Attention Level: Focused Memory: Decreased short-term memory Following Commands: Follows one step commands with increased time Safety/Judgement: Decreased awareness of safety;Decreased awareness of deficits Awareness: Intellectual Problem Solving: Slow processing;Requires verbal cues;Requires tactile cues;Difficulty sequencing;Decreased initiation General Comments: pt lethargic initially, improved with mobiltiy. pt restless with being in bed, but cooperative with mobility. pt with mostly unintelligible speech      Exercises      General Comments        Pertinent Vitals/Pain Pain Assessment: No/denies pain Pain Intervention(s): Limited activity within patient's tolerance;Monitored during session    Home Living                      Prior Function            PT Goals (current goals can now be found in the care plan section) Acute Rehab PT Goals Patient Stated Goal: return hoe PT Goal Formulation: With family Time For Goal Achievement: 04/24/19 Potential to Achieve Goals: Good Progress towards PT goals: Progressing toward goals    Frequency    Min 4X/week(family wants to work towards d/c home with HHPT)      PT Plan Frequency needs to be updated    Co-evaluation              AM-PAC PT "6 Clicks" Mobility   Outcome Measure  Help needed turning from your back to your side while in a flat bed without using bedrails?: A Little Help needed moving from lying on your back to sitting on the side of a flat bed without using bedrails?: A Little Help needed moving to and from a bed to a chair (including a wheelchair)?: A Little Help needed standing up from a chair using your arms (e.g., wheelchair or bedside chair)?: A Little Help needed to walk in hospital room?: A Little Help needed climbing 3-5 steps with a railing? : A  Lot 6 Click Score: 17    End of Session Equipment Utilized During Treatment: Gait belt Activity Tolerance: Patient tolerated treatment well Patient left: in bed;with call bell/phone within reach;with bed alarm set;with family/visitor present;with nursing/sitter in room(pt with restraints on but not fastened upon PT arrival, sitter agreeable to leaving them unfastened after PT session) Nurse Communication: Mobility status PT Visit Diagnosis: Other abnormalities of gait and mobility (R26.89);Muscle weakness (generalized) (M62.81);Other symptoms and signs involving the nervous system (R29.898);Unsteadiness on feet (R26.81);Difficulty in walking, not elsewhere classified (R26.2)     Time: 9242-6834 PT Time Calculation (min) (ACUTE ONLY): 32 min  Charges:  $Gait Training: 23-37 mins                     Karma Ganja, PT, DPT   Acute Rehabilitation Department Pager #: [phone]   Otho Bellows 04/10/2019, 2:08 PM

## 2019-04-10 NOTE — Progress Notes (Signed)
Inpatient Rehabilitation-Admissions Coordinator   Met with pt and his daughter in the room after PT session. While we noted improvement in mobility during therapy session, both AC and daughter agree that the pt has significant cognitive deficits that would limit is ability to fully participate in CIR program. Per daughter, the pt and family prefer him to return directly home and agree that he may do better in his own familiar environment compared to a rehab setting. As pt and family prefer home for DC setting, AC will sign off at this time.   Please call if questions.   Raechel Ache, OTR/L  Rehab Admissions Coordinator  [phone] 04/10/2019 4:00 PM

## 2019-04-10 NOTE — Progress Notes (Signed)
PROGRESS NOTE    Jonathan Berger  HGD:[id number] DOB: April 18, 1931 DOA: 03/21/2019 PCP: [name], MD     Brief Narrative:  Jonathan Berger is a 24 yr oldmale with history of hypertension, who was admitted by Dr. [name] on 03/21/2019 for dense right ischemic MCA CVA, s/p TPA and VIR intervention with balloon angioplasty and stent placement at proximal R ICA. Pt developed right lower lobe aspiration pneumonitis after extubation on 2/11 and was later re-intubated on 2/15 and extubated on 2/17 . Pt was also found to have new systolic CHF with EF 96%. Pt had been managed in the ICU and transferred to the floor, TRH assumed care on 04/03/19.  Due to dysphagia, patient remains on tube feed via cortrak.  Palliative care involved in goals of care conversations.  New events last 24 hours / Subjective: Continues to have agitations overnight, now remains in four-point soft restraints as well as abdominal restraint.  He is alert but not interactive in a meaningful way.  Assessment & Plan:   Principal Problem:   Middle cerebral artery embolism, right Active Problems:   Acute on chronic respiratory failure with hypoxia (HCC)   Systolic dysfunction   Dehydration   Aspiration pneumonia of right lower lobe due to gastric secretions (HCC)   Tachycardia   Hypoxia   Paroxysmal atrial fibrillation (HCC)   Multifocal atrial tachycardia (HCC)   Palliative care by specialist   Goals of care, counseling/discussion   DNR (do not resuscitate)   Muscular weakness   Dysphagia   Xerostomia   Delirium   Acute hypoxemic respiratory failure likely secondary to recent CVA/aspiration PNA/acute CHF -Status post intubation x2, recently extubated on 04/01/2019 -Completed cefepime and vancomycin x 7 days on 04/05/19 -Remains on room air  Right MCA infarct s/p tPA and L ICA stent placement -MRI brain with right temporal lobe cortical/subcortical infarct with numerous small scattered R brain infarcts -CTA head and  neck showed right MCA LVO mid and distal segment. Right ICA critical stenosis, left ICA distal bulb string sign, severe B VA origin stenosis -Repeat CT head on 04/06/2019 unremarkable -Echo showed EF 22%, grade 1 diastolic dysfunction -Carotid Doppler showed left ICA 60 to 79% stenosis -LDL 80, A1c 5.9 -Continue Brilinta, aspirin, Lipitor -PT/OT/SLP -?Plan for further left ICA intervention in the future  Acute systolic and diastolic HF Multifocal atrial tachycardia (MAT) -Echo showed EF of 29%, grade 1 diastolic dysfunction -Cardiology following, plan to repeat echo as an outpatient and consider ischemic evaluation if EF remains depressed -Continue metoprolol   Acute urinary retention -S/p foley catheter placement   Hypertension -Continue amlodipine. Valsartan-hydrochlorothiazide on hold  Dysphagia -Currently on tube feeds via cortrak and remains NPO  -SLP, dietitian on board  Dementia -Currently with delirium, likely ICU/hospital delirium -Continue Seroquel, continue Aricept, restraints PRN and sitter at bedside PRN   [place] -Patient with very poor prognosis -Palliative consulted for further goals of care  Hypernatremia -Improving on D10.  Continue to monitor BMP  Hypokalemia -Replace, trend    DVT prophylaxis: Lovenox  Code Status: DNR Family Communication: None at bedside; discussed with daughter over the phone Disposition Plan:  . Patient is from home prior to admission. Marland Kitchen Barrier(s) to discharge include continued tube feeding via cortrak and ongoing goals of care conversation.  Had a discussion with the daughter over the phone.  She discusses that patient does better under her and her mother's care as they are able to know his preferences for drinks, get him up  to a chair etc. I also had a discussion with palliative care team who had discussed with daughter today.  They are planning for a family meeting over the weekend, hopefully family can come to an agreement  about home hospice as this is likely the best option for him to transition to comfort.   Consultants:   Neurology  Critical care  Cardiology  Palliative care   Antimicrobials:  Anti-infectives (From admission, onward)   Start     Dose/Rate Route Frequency Ordered Stop   03/31/19 1100  vancomycin (VANCOCIN) IVPB 1000 mg/200 mL premix     1,000 mg 200 mL/hr over 60 Minutes Intravenous Every 24 hours 03/31/19 1027 04/04/19 1356   03/30/19 1000  ceFEPIme (MAXIPIME) 2 g in sodium chloride 0.9 % 100 mL IVPB     2 g 200 mL/hr over 30 Minutes Intravenous Every 12 hours 03/30/19 0806 04/04/19 2212   03/29/19 1230  vancomycin (VANCOREADY) IVPB 1750 mg/350 mL  Status:  Discontinued     1,750 mg 175 mL/hr over 120 Minutes Intravenous Every 48 hours 03/29/19 1141 03/31/19 1027   03/29/19 1200  ceFEPIme (MAXIPIME) 2 g in sodium chloride 0.9 % 100 mL IVPB  Status:  Discontinued     2 g 200 mL/hr over 30 Minutes Intravenous Every 24 hours 03/29/19 1129 03/30/19 0806   03/29/19 1130  vancomycin (VANCOCIN) IVPB 1000 mg/200 mL premix  Status:  Discontinued     1,000 mg 200 mL/hr over 60 Minutes Intravenous  Once 03/29/19 1129 03/29/19 1140   03/24/19 0930  ampicillin-sulbactam (UNASYN) 1.5 g in sodium chloride 0.9 % 100 mL IVPB  Status:  Discontinued     1.5 g 200 mL/hr over 30 Minutes Intravenous Every 8 hours 03/24/19 0852 03/29/19 1129   04/12/2019 1453  ceFAZolin (ANCEF) 2-4 GM/100ML-% IVPB    Note to Pharmacy: Novella Rob   : cabinet override      04/05/2019 1453 03/21/2019 1833       Objective: Vitals:   04/10/19 0041 04/10/19 0405 04/10/19 0735 04/10/19 1224  BP: 110/65 111/89 128/77 118/64  Pulse: 76 80 75 62  Resp: '20 20 20 18  ' Temp: 97.7 F (36.5 C) 98.1 F (36.7 C) 98.3 F (36.8 C) 97.6 F (36.4 C)  TempSrc: Axillary Axillary Oral Oral  SpO2: 98% 99% 98% 97%  Weight:      Height:        Intake/Output Summary (Last 24 hours) at 04/10/2019 1303 Last data filed at  04/10/2019 1028 Gross per 24 hour  Intake --  Output 1250 ml  Net -1250 ml   Filed Weights   04/02/19 0500 04/07/19 0600 04/09/19 0401  Weight: 84.7 kg 86.2 kg 86.2 kg    Examination: General exam: Appears confused, disoriented Respiratory system: Clear to auscultation anteriorly Cardiovascular system: S1 & S2 heard, RRR. No pedal edema. Gastrointestinal system: Abdomen is nondistended, soft and nontender. + Cortrak in place Central nervous system: Alert, mumbles incoherently Extremities: Symmetric in appearance bilaterally   Data Reviewed: I have personally reviewed following labs and imaging studies  CBC: Recent Labs  Lab 04/04/19 0331 04/05/19 0735 04/06/19 0403 04/07/19 0404 04/08/19 0627  WBC 11.0* 8.3 8.1 7.5 8.1  NEUTROABS 8.9* 6.5 5.6 5.2 5.8  HGB 11.4* 10.9* 11.2* 11.6* 11.8*  HCT 34.5* 34.3* 34.6* 36.4* 37.6*  MCV 97.7 101.5* 100.3* 100.8* 101.9*  PLT 253 286 313 355 341   Basic Metabolic Panel: Recent Labs  Lab 04/05/19 0735 04/05/19 0735 04/06/19 0403  04/07/19 0404 04/08/19 0627 04/09/19 0106 04/10/19 0338  NA 142   < > 145 148* 152* 153* 148*  K 3.4*   < > 3.4* 3.5 3.7 3.8 3.3*  CL 110   < > 109 114* 117* 117* 114*  CO2 23   < > '23 25 26 24 26  ' GLUCOSE 135*   < > 132* 156* 147* 119* 151*  BUN 22   < > 25* 25* 32* 37* 37*  CREATININE 1.13   < > 1.16 1.31* 1.32* 1.42* 1.31*  CALCIUM 8.9   < > 9.3 9.6 10.1 9.8 9.6  PHOS 2.5  --   --   --   --   --   --    < > = values in this interval not displayed.   GFR: Estimated Creatinine Clearance: 43.1 mL/min (A) (by C-G formula based on SCr of 1.31 mg/dL (H)). Liver Function Tests: No results for input(s): AST, ALT, ALKPHOS, BILITOT, PROT, ALBUMIN in the last 168 hours. No results for input(s): LIPASE, AMYLASE in the last 168 hours. No results for input(s): AMMONIA in the last 168 hours. Coagulation Profile: No results for input(s): INR, PROTIME in the last 168 hours. Cardiac Enzymes: No results for  input(s): CKTOTAL, CKMB, CKMBINDEX, TROPONINI in the last 168 hours. BNP (last 3 results) No results for input(s): PROBNP in the last 8760 hours. HbA1C: No results for input(s): HGBA1C in the last 72 hours. CBG: Recent Labs  Lab 04/09/19 2040 04/10/19 0038 04/10/19 0357 04/10/19 0815 04/10/19 1219  GLUCAP 120* 120* 131* 132* 105*   Lipid Profile: No results for input(s): CHOL, HDL, LDLCALC, TRIG, CHOLHDL, LDLDIRECT in the last 72 hours. Thyroid Function Tests: No results for input(s): TSH, T4TOTAL, FREET4, T3FREE, THYROIDAB in the last 72 hours. Anemia Panel: No results for input(s): VITAMINB12, FOLATE, FERRITIN, TIBC, IRON, RETICCTPCT in the last 72 hours. Sepsis Labs: No results for input(s): PROCALCITON, LATICACIDVEN in the last 168 hours.  Recent Results (from the past 240 hour(s))  Culture, respiratory (non-expectorated)     Status: None   Collection Time: 04/01/19  9:15 AM   Specimen: Tracheal Aspirate; Respiratory  Result Value Ref Range Status   Specimen Description TRACHEAL ASPIRATE  Final   Special Requests NONE  Final   Gram Stain   Final    FEW WBC PRESENT,BOTH PMN AND MONONUCLEAR RARE YEAST RARE GRAM POSITIVE COCCI IN CLUSTERS Performed at [facility], [place] [address], [place], [place] [address]    Culture   Final    FEW STAPHYLOCOCCUS EPIDERMIDIS FEW CANDIDA ALBICANS    Report Status 04/05/2019 FINAL  Final   Organism ID, Bacteria STAPHYLOCOCCUS EPIDERMIDIS  Final      Susceptibility   Staphylococcus epidermidis - MIC*    CIPROFLOXACIN <=0.5 SENSITIVE Sensitive     ERYTHROMYCIN >=8 RESISTANT Resistant     GENTAMICIN <=0.5 SENSITIVE Sensitive     OXACILLIN >=4 RESISTANT Resistant     TETRACYCLINE 2 SENSITIVE Sensitive     VANCOMYCIN 2 SENSITIVE Sensitive     TRIMETH/SULFA 160 RESISTANT Resistant     CLINDAMYCIN <=0.25 SENSITIVE Sensitive     RIFAMPIN <=0.5 SENSITIVE Sensitive     Inducible Clindamycin NEGATIVE Sensitive     * FEW  STAPHYLOCOCCUS EPIDERMIDIS  C difficile quick scan w PCR reflex     Status: None   Collection Time: 04/04/19  1:21 PM   Specimen: STOOL  Result Value Ref Range Status   C Diff antigen NEGATIVE NEGATIVE Final  C Diff toxin NEGATIVE NEGATIVE Final   C Diff interpretation No C. difficile detected.  Final    Comment: Performed at [facility], [place] [address] [place], [place] [address]      Radiology Studies: No results found.    Scheduled Meds: . amLODipine  5 mg Per Tube Daily  . aspirin  81 mg Per Tube Daily  . atorvastatin  40 mg Per Tube q1800  . chlorhexidine gluconate (MEDLINE KIT)  15 mL Mouth Rinse BID  . Chlorhexidine Gluconate Cloth  6 each Topical Daily  . donepezil  5 mg Per Tube QHS  . doxazosin  1 mg Per Tube Daily  . enoxaparin (LOVENOX) injection  40 mg Subcutaneous Q24H  . feeding supplement (PRO-STAT SUGAR FREE 64)  30 mL Per Tube TID  . folic acid  1 mg Per Tube Daily  . free water  200 mL Per Tube Q6H  . Gerhardt's butt cream   Topical BID  . insulin aspart  0-9 Units Subcutaneous Q4H  . mouth rinse  15 mL Mouth Rinse 10 times per day  . metoprolol tartrate  75 mg Per Tube BID  . pantoprazole sodium  40 mg Per Tube BID  . QUEtiapine  25 mg Oral QHS  . thiamine  100 mg Per Tube Daily  . ticagrelor  90 mg Oral BID   Or  . ticagrelor  90 mg Per Tube BID   Continuous Infusions: . sodium chloride    . sodium chloride Stopped (04/01/19 1509)  . dextrose Stopped (04/09/19 1134)  . feeding supplement (JEVITY 1.5 CAL/FIBER) 1,000 mL (04/08/19 0145)     LOS: 19 days      Time spent: 35 minutes   Dessa Phi, DO [facility] 04/10/2019, 1:03 PM   Available via Epic secure chat 7am-7pm After these hours, please refer to coverage provider listed on [email or web address]

## 2019-04-10 NOTE — Progress Notes (Signed)
Civil engineer, contracting Documentation      Liaison received referral for pt to return home s/p discharge with hospice services.     Writer contacted daughter, [name] and wife Bonita Quin, to confirm interest. Explained hospice services and philosophy and answered all questions. Daughter agrees to hospice services and stated that they would like to meet in person at 10am tomorrow morning.    Pt's chart in review by hospice MD to determine eligibility. Will update TOC and family once pt is approved.    Please do not hesitate to outreach with any questions and thank you for the referral.     Trena Platt, RN  [facility] Liaison  [phone]

## 2019-04-10 NOTE — Progress Notes (Signed)
Palliative Medicine Inpatient Follow Up Note  HPI: Per hospitalist note --> 75 yr oldmale with history of hypertension, who was admitted by Dr. [name] on 03/21/2019 for dense right ischemic MCA CVA, s/p TPA and VIR intervention with balloon angioplasty and stent placement at proximal R ICA.Ptdeveloped right lower lobe aspiration pneumonitis after extubation on 2/11/2021and was later re-intubated on 2/15 and extubated on 2/17 . Ptwas alsofound to have new systolic CHF with EF 02%.Pt had been managed in the ICU and transferred to the floor, TRH assumed care on 04/03/19.  Today's Discussion (04/10/2019): Chart reviewed. Met with Tramond, he is in mittens and restraints. He appears similarly to last week.   I met with patients daughter, Marlowe Kays at bedside. She vocalized that she feels at a disadvantage because her father wants to get out of bed but the staff refuses to do it. I shared with her that the staff is likely worried about Tremar falling and that this is a big risk with patients such as him. She vocalized understanding.   Marlowe Kays shares that her mother remains hopeful that Saurav will improve, but she is feeling less optimistic. We discussed that sometimes transitioning what we hope for is helpful in terms of coming to grips with reality. Presently Kemari has been hospitalized for nineteen days with little improvement. I suggested that in order to better align with Leelynd's wishes it might be of benefit to consider him going home with hospice. Marlowe Kays shares that his favorite thing to do it to sit on his front porch. I shared with her that he would likely be far happier in a familiar environment surrounded by people that he loves. Marlowe Kays agreed.   We talked about placing a hospice consult. Marlowe Kays states that she has family members who want to talk to the team more about what hospice at home looks like. I told her that tomorrow at [place] would be a fine time to meet to further define these goals.    All questions answered.   Vital Signs Vitals:   04/10/19 0735 04/10/19 1224  BP: 128/77 118/64  Pulse: 75 62  Resp: 20 18  Temp: 98.3 F (36.8 C) 97.6 F (36.4 C)  SpO2: 98% 97%    Intake/Output Summary (Last 24 hours) at 04/10/2019 1338 Last data filed at 04/10/2019 1028 Gross per 24 hour  Intake --  Output 1250 ml  Net -1250 ml   Last Weight  Most recent update: 04/09/2019  4:03 AM   Weight  86.2 kg (190 lb 0.6 oz)           Physical Exam Vitals and nursing note reviewed.  HENT:     Head: Normocephalic.     Nose:     Comments: Coretrack in place    Mouth/Throat:     Mouth: Mucous membranes are dry.  Eyes:     Pupils: Pupils are equal, round, and reactive to light.  Cardiovascular:     Rate and Rhythm: Tachycardia present. Rhythm irregular.  Musculoskeletal:     Cervical back: Normal range of motion.  Skin:    General: Skin is warm.  Neurological:     Mental Status: He is alert. He is disoriented.  Psychiatric:         Judgment: Judgment is impulsive.   SUMMARY OF RECOMMENDATIONS   DNR/DNI  Ongoing [place] conversations, plan for 10AM meeting  TOC --> Hospice for home consult  Time Spent: 25 Greater than 50% of the time was spent in counseling and  coordination of care ______________________________________________________________________________________ [place] Team Team Cell Phone: [phone] Please utilize secure chat with additional questions, if there is no response within 30 minutes please call the above phone number  Palliative Medicine Team providers are available by phone from 7am to 7pm daily and can be reached through the team cell phone.  Should this patient require assistance outside of these hours, please call the patient's attending physician.

## 2019-04-10 NOTE — TOC Initial Note (Signed)
Transition of Care [facility]) - Initial/Assessment Note    Patient Details  Name: Jonathan Berger MRN: [id number] Date of Birth: February 19, 1931  Transition of Care [facility]) CM/SW Contact:    Geralynn Ochs, LCSW Phone Number: 04/10/2019, 4:30 PM  Clinical Narrative:    CSW alerted by PMT NP that family is interested in hospice, will be family a meeting tomorrow and would like to coordinate with hospice provider to have them available for meeting tomorrow morning at 10 am. CSW met with patient's daughter/HCPOA Marlowe Kays to discuss. Marlowe Kays explained family dynamics and how there is concern with some of the patient's children about involving "hospice" and they are upset, but Marlowe Kays is realistic about what is occurring with the patient and the biggest priority is getting him home and enjoying the time that they have left with him. CSW provided choice to Marlowe Kays and she chose [facility] for hospice provider. CSW provided referral to [facility] and explained the request to have them involved in the meeting tomorrow morning at 10. [facility] representative to coordinate with family. CSW to follow.          Expected Discharge Plan: Home w Hospice Care Barriers to Discharge: Continued Medical Work up   Patient Goals and CMS Choice Patient states their goals for this hospitalization and ongoing recovery are:: patient unable to participate in goal setting at this time, only oriented to self CMS Medicare.gov Compare Post Acute Care list provided to:: Patient Represenative (must comment) Choice offered to / list presented to : [place] / Guardian  Expected Discharge Plan and Services Expected Discharge Plan: [place] Acute Care Choice: Hospice Living arrangements for the past 2 months: Single Family Home                                      Prior Living Arrangements/Services Living arrangements for the past 2 months: Single Family Home Lives with:: Spouse Patient language and  need for interpreter reviewed:: No Do you feel safe going back to the place where you live?: Yes      Need for Family Participation in Patient Care: Yes (Comment) Care giver support system in place?: Yes (comment)   Criminal Activity/Legal Involvement Pertinent to Current Situation/Hospitalization: No - Comment as needed  Activities of Daily Living Home Assistive Devices/Equipment: None    Permission Sought/Granted Permission sought to share information with : Facility Sport and exercise psychologist, Family Supports Permission granted to share information with : Yes, Verbal Permission Granted  Share Information with NAME: Erroll Luna  Permission granted to share info w AGENCY: [facility]  Permission granted to share info w Relationship: Daughter/HCPOA, Wife     Emotional Assessment   Attitude/Demeanor/Rapport: Unable to Assess Affect (typically observed): Unable to Assess Orientation: : Oriented to Self      Admission diagnosis:  Stroke (cerebrum) ([place]) [I63.9] Middle cerebral artery embolism, right [I66.01] Patient Active Problem List   Diagnosis Date Noted  . Palliative care by specialist   . Goals of care, counseling/discussion   . DNR (do not resuscitate)   . Muscular weakness   . Dysphagia   . Xerostomia   . Delirium   . Hypoxia   . Paroxysmal atrial fibrillation (HCC)   . Multifocal atrial tachycardia (HCC)   . Dehydration 03/29/2019  . Aspiration pneumonia of right lower lobe due to gastric secretions ([place]) 03/29/2019  . Tachycardia 03/29/2019  .  Systolic dysfunction   . Respiratory failure ([place])   . Acute on chronic respiratory failure with hypoxia ([place])   . Hypotension   . Stroke (cerebrum) ([place]) 03/17/2019  . Middle cerebral artery embolism, right 04/04/2019  . Herniated nucleus pulposus, L4-5 right 01/26/2011   PCP:  [name], MD Pharmacy:   [id number] [place] [id number] [id number]: [phone]: [phone]    Social Determinants of Health (SDOH) Interventions    Readmission Risk Interventions No flowsheet data found.

## 2019-04-11 LAB — BASIC METABOLIC PANEL
Anion gap: 7 (ref 5–15)
BUN: 32 mg/dL — ABNORMAL HIGH (ref 8–23)
CO2: 28 mmol/L (ref 22–32)
Calcium: 9.7 mg/dL (ref 8.9–10.3)
Chloride: 112 mmol/L — ABNORMAL HIGH (ref 98–111)
Creatinine, Ser: 1.25 mg/dL — ABNORMAL HIGH (ref 0.61–1.24)
GFR calc Af Amer: >60 mL/min (ref >60)
GFR calc non Af Amer: 52 mL/min — ABNORMAL LOW (ref >60)
Glucose, Bld: 122 mg/dL — ABNORMAL HIGH (ref 70–99)
Potassium: 4.1 mmol/L (ref 3.5–5.1)
Sodium: 147 mmol/L — ABNORMAL HIGH (ref 135–145)

## 2019-04-11 LAB — GLUCOSE, CAPILLARY
Glucose-Capillary: 103 mg/dL — ABNORMAL HIGH (ref 70–99)
Glucose-Capillary: 104 mg/dL — ABNORMAL HIGH (ref 70–99)
Glucose-Capillary: 115 mg/dL — ABNORMAL HIGH (ref 70–99)
Glucose-Capillary: 132 mg/dL — ABNORMAL HIGH (ref 70–99)
Glucose-Capillary: 142 mg/dL — ABNORMAL HIGH (ref 70–99)
Glucose-Capillary: 144 mg/dL — ABNORMAL HIGH (ref 70–99)

## 2019-04-11 LAB — MAGNESIUM: Magnesium: 2.4 mg/dL (ref 1.7–2.4)

## 2019-04-11 NOTE — Progress Notes (Signed)
Speech Language Pathology Treatment: Dysphagia  Patient Details Name: Jonathan Berger MRN: [id number] DOB: 31-Jul-1931 Today's Date: 04/11/2019 Time: 3329-5188 SLP Time Calculation (min) (ACUTE ONLY): 45 min  Assessment / Plan / Recommendation Clinical Impression  Pt was seen for skilled ST targeting dysphagia and PO readiness at request of palliative NP and family.  Pt was slightly more alert than previous session on 2/25; however, he remained lethargic and confused throughout this session.  Pt's wife and one of his daughters was present for this tx session.  Pt was observed to have significant secretion build up on his lingual surface and on his soft palate.  Thorough oral care was completed and a majority of the build up was removed from the surface of his soft palate.  Pt was observed to have wet vocal quality upon SLP arrival, possibly indicating poor secretion management with potential aspiration of his secretions.  He consumed trials of thin liquid, nectar-thick liquid, honey-thick liquid, and puree via 1/2 tsp.  Pt was unable to achieve labial closure around the spoon despite cues, and trials were accepted passively into his oral cavity.  Minimal lingual manipulation was observed with liquid trials, and no swallow initiation was initially observed with thin liquid trials.  Pt exhibited an immediate cough following thin liquid trials with a subsequent swallow.  Continue to suspect premature spillage to the pharynx secondary to reduced oral control.  Pt consistently triggered a swallow with nectar-thick liquid and honey-thick liquid trials, although suspect that it was delayed.  Pt with limited lingual manipulation of the puree trials with bolus holding noted.  Swallow initiation was only observed during 1/2 puree trials.  The remaining bolus was suctioned from the pt's oral cavity secondary to aspiration concerns.  Observed increased wet vocal quality following all PO trials.  Pt remains at an  elevated risk for aspiration with all consistencies. Spoke with the pt's daughter in dept regarding the pt's current swallow abilities, recommendations, and the likelihood that he would not show drastic improvement in the next few days.  She stated that she was hopeful for improvement by Tuesday (when the family will decide between home health and hospice), but that she was realistic about his prognosis.  She requested that ST follow up each day with the pt to determine if he has made any significant improvement by Tuesday.  Additionally discussed the pt having small sips of water for comfort PRN given by his daughter after oral care with known risk of aspiration.  She stated that she would like to pursue this option and she verbalized the risk of aspiration.  SLP will f/u per POC.      HPI HPI: Pt is an 86 year old male presenting 2/7 with slurred speech and dysphagia, found to have acute R MCA stroke s/p tPA and EVR R MCA and ICA with stent. ETT 2/7-2/11. PMH: HTN. Intubated 2/7-2/11.      SLP Plan  Continue with current plan of care       Recommendations  Diet recommendations: NPO Liquids provided via: Teaspoon Medication Administration: Via alternative means Supervision: Full supervision/cueing for compensatory strategies Compensations: Minimize environmental distractions Postural Changes and/or Swallow Maneuvers: Seated upright 90 degrees                Oral Care Recommendations: Staff/trained caregiver to provide oral care;Oral care QID;Oral care prior to ice chip/H20 Follow up Recommendations: 24 hour supervision/assistance;Skilled Nursing facility SLP Visit Diagnosis: Dysphagia, unspecified (R13.10) Plan: Continue with current plan of care  Villa Herb., M.S., CCC-SLP Acute Rehabilitation Services Office: [phone]  Shanon [facility] 04/11/2019, 2:46 PM

## 2019-04-11 NOTE — Progress Notes (Signed)
Oral care provided 

## 2019-04-11 NOTE — Progress Notes (Signed)
Palliative Medicine Inpatient Follow Up Note  HPI: Per hospitalist note --> 72 yr oldmale with history of hypertension, who was admitted by Dr. [name] on 04/03/2019 for dense right ischemic MCA CVA, s/p TPA and VIR intervention with balloon angioplasty and stent placement at proximal R ICA.Ptdeveloped right lower lobe aspiration pneumonitis after extubation on 2/11/2021and was later re-intubated on 2/15 and extubated on 2/17 . Ptwas alsofound to have new systolic CHF with EF 76%.Pt had been managed in the ICU and transferred to the floor, [facility] assumed care on 04/03/19.  Today's Discussion (04/11/2019): Chart reviewed. Met with Jonathan Berger, he is in mittens and restraints with a sister at bedside. He is smiling when I entered the room and able to respond with some words.   At 1000 I met with Jonathan Berger, Jonathan Berger, Jonathan Berger, and Jonathan Berger, and Jonathan Berger ([facility]). We discussed dads present prognosis and the lack of improvements he has made in the last nineteen days. We reviewed that he has not been able to follow direction well and is impulsive at times. I shared with them my concern that he is not thriving in the hospital setting. We talked about how he is not a candidate for CIR or rehabilitation given his poor ability to follow direction.   Jonathan Berger introduced the topic of hospice and the care that they would provide the patient in his home inclusive of DME delivery, nursing, and CNA care. The family halted at that and said that we are "just going to send him home to die." We emphasized that going home on hospice does not mean you are going to die immediately. The goal is truly to enjoy the time that we have left surrounded by the people and things we most love in life. We talked about the shift in focus from curative treatments to symptomatic relief.   The family seemed to have the hardest time conceptualizing the inability for the patient to get PT/OT/Speech  therapy. We talked at length about this for over ninety minutes in fact. We reviewed medicare and how they pay for services. We reviewed what might be best for the patient at this point in time. They stated that she wanted to give him "every fighting chance" for improvement. I shared with them what had been said in the PT/OT/Speech therapy notes. We talked about his inability to safely eat and drink. We discussed his NGT and what a G-Tube in a situation such as Jonathan Berger would look like. I shared with them that I would strongly discourage its consideration as Jonathan Berger has an underlying history of dementia.   Health care professionals commonly rely on feeding tubes to supply nutrition to these severely demented patients. However, various studies have not shown use of feeding tubes to be effective in preventing malnutrition. Furthermore, they have not been demonstrated to prevent the occurrence or increase the healing of pressure sores, prevent aspiration pneumonia, provide comfort, improve functional status, or extend life. High complication rates, increased use of restraints, and other adverse effects further increase the burden of feeding tubes in severely demented patients.  The family asked what could be done to give him the best opportunity for improvement. I shared with them that if he cannot safely eat and drink then he will likely not be with [place] very long. They seemed to understand this. They asked if speech therapy could re-evaluate him as the family knows he would like to drink ensure, milk, and pepsi if he could. A request was  made to the therapy department to evaluate Jonathan Berger while his whole family is present.  At this time the family is interested for Jonathan Berger to work with PT/OT/speech. They would like to formally see and hear the recommendations from their evaluations. We decided to follow up with one another on Monday to decide if Jonathan Berger would be best served going home with HH or going home with hospice.    All questions answered.   Vital Signs Vitals:   04/11/19 0509 04/11/19 0727  BP: 135/64 (!) 150/85  Pulse: (!) 58 74  Resp: 19 16  Temp: (!) 97.4 F (36.3 C) 98 F (36.7 C)  SpO2:  97%    Intake/Output Summary (Last 24 hours) at 04/11/2019 1207 Last data filed at 04/11/2019 0900 Gross per 24 hour  Intake --  Output 1150 ml  Net -1150 ml   Last Weight  Most recent update: 04/09/2019  4:03 AM   Weight  86.2 kg (190 lb 0.6 oz)           Physical Exam Vitals and nursing note reviewed.  HENT:     Head: Normocephalic.     Nose:     Comments: Coretrack in place    Mouth/Throat:     Mouth: Mucous membranes are dry.  Eyes:     Pupils: Pupils are equal, round, and reactive to light.  Cardiovascular:     Rate and Rhythm: Tachycardia present. Rhythm irregular.  Musculoskeletal:     Cervical back: Normal range of motion.  Skin:    General: Skin is warm.  Neurological:     Mental Status: He is alert. He is disoriented.  Psychiatric:         Judgment: Judgment is impulsive.   SUMMARY OF RECOMMENDATIONS   DNR/DNI  Ongoing GOC conversations, plan to follow up on Monday to get a better impression of if the patient will go home with HH or home with hospice  Appreciate PT/OT/Speech therapy insights on patients rehab potential  Time Started: 1000 AM Time Ended: 11:34 AM Time Spent: 94 minutes Greater than 50% of the time was spent in counseling and coordination of care ______________________________________________________________________________________   [place] Palliative Medicine Team Team Cell Phone: [phone] Please utilize secure chat with additional questions, if there is no response within 30 minutes please call the above phone number  Palliative Medicine Team providers are available by phone from 7am to 7pm daily and can be reached through the team cell phone.  Should this patient require assistance outside of these hours, please call the  patient's attending physician.     

## 2019-04-11 NOTE — Progress Notes (Signed)
Oral care performed.

## 2019-04-11 NOTE — Progress Notes (Signed)
PROGRESS NOTE    Jonathan Berger  HFW:[id number] DOB: 06-15-1931 DOA: 03/25/2019 PCP: [name], MD     Brief Narrative:  Jonathan Berger is a 76 yr oldmale with history of hypertension, who was admitted by Dr. [name] on 03/27/2019 for dense right ischemic MCA CVA, s/p TPA and VIR intervention with balloon angioplasty and stent placement at proximal R ICA. Pt developed right lower lobe aspiration pneumonitis after extubation on 2/11 and was later re-intubated on 2/15 and extubated on 2/17 . Pt was also found to have new systolic CHF with EF 02%. Pt had been managed in the ICU and transferred to the floor, [facility] assumed care on 04/03/19.  Due to dysphagia, patient remains on tube feed via cortrak.  Palliative care involved in goals of care conversations.  New events last 24 hours / Subjective: Sitter at bedside.  Continues to remain on four-point soft restraints and abdominal restraint.  He is moaning on exam, noninteractive on examination.  Assessment & Plan:   Principal Problem:   Middle cerebral artery embolism, right Active Problems:   Acute on chronic respiratory failure with hypoxia (HCC)   Systolic dysfunction   Dehydration   Aspiration pneumonia of right lower lobe due to gastric secretions (HCC)   Tachycardia   Hypoxia   Paroxysmal atrial fibrillation (HCC)   Multifocal atrial tachycardia (HCC)   Palliative care by specialist   Goals of care, counseling/discussion   DNR (do not resuscitate)   Muscular weakness   Dysphagia   Xerostomia   Delirium   Acute hypoxemic respiratory failure likely secondary to recent CVA/aspiration PNA/acute CHF -Status post intubation x2, recently extubated on 04/01/2019 -Completed cefepime and vancomycin x 7 days on 04/05/19 -Remains on room air  Right MCA infarct s/p tPA and L ICA stent placement -MRI brain with right temporal lobe cortical/subcortical infarct with numerous small scattered R brain infarcts -CTA head and neck showed right  MCA LVO mid and distal segment. Right ICA critical stenosis, left ICA distal bulb string sign, severe B VA origin stenosis -Repeat CT head on 04/06/2019 unremarkable -Echo showed EF 77%, grade 1 diastolic dysfunction -Carotid Doppler showed left ICA 60 to 79% stenosis -LDL 80, A1c 5.9 -Continue Brilinta, aspirin, Lipitor -PT/OT/SLP -?Plan for further left ICA intervention in the future  Acute systolic and diastolic HF Multifocal atrial tachycardia (MAT) -Echo showed EF of 41%, grade 1 diastolic dysfunction -Cardiology following, plan to repeat echo as an outpatient and consider ischemic evaluation if EF remains depressed -Continue metoprolol   Acute urinary retention -S/p foley catheter placement   Hypertension -Continue amlodipine. Valsartan-hydrochlorothiazide on hold  Dysphagia -Currently on tube feeds via cortrak and remains NPO  -SLP, dietitian on board  Dementia -Currently with delirium, likely ICU/hospital delirium -Continue Seroquel, continue Aricept, restraints PRN and sitter at bedside PRN   [place] -Patient with very poor prognosis -Palliative consulted for further goals of care  Hypernatremia -Improving on D10.  Continue to monitor BMP     DVT prophylaxis: Lovenox  Code Status: DNR Family Communication: None at bedside Disposition Plan:  . Patient is from home prior to admission. Marland Kitchen Barrier(s) to discharge include continued tube feeding via cortrak and ongoing goals of care conversation.  Planned for family meeting with palliative care and hospice team today   Consultants:   Neurology  Critical care  Cardiology  Palliative care   Antimicrobials:  Anti-infectives (From admission, onward)   Start     Dose/Rate Route Frequency Ordered Stop   03/31/19  1100  vancomycin (VANCOCIN) IVPB 1000 mg/200 mL premix     1,000 mg 200 mL/hr over 60 Minutes Intravenous Every 24 hours 03/31/19 1027 04/04/19 1356   03/30/19 1000  ceFEPIme (MAXIPIME) 2 g in  sodium chloride 0.9 % 100 mL IVPB     2 g 200 mL/hr over 30 Minutes Intravenous Every 12 hours 03/30/19 0806 04/04/19 2212   03/29/19 1230  vancomycin (VANCOREADY) IVPB 1750 mg/350 mL  Status:  Discontinued     1,750 mg 175 mL/hr over 120 Minutes Intravenous Every 48 hours 03/29/19 1141 03/31/19 1027   03/29/19 1200  ceFEPIme (MAXIPIME) 2 g in sodium chloride 0.9 % 100 mL IVPB  Status:  Discontinued     2 g 200 mL/hr over 30 Minutes Intravenous Every 24 hours 03/29/19 1129 03/30/19 0806   03/29/19 1130  vancomycin (VANCOCIN) IVPB 1000 mg/200 mL premix  Status:  Discontinued     1,000 mg 200 mL/hr over 60 Minutes Intravenous  Once 03/29/19 1129 03/29/19 1140   03/24/19 0930  ampicillin-sulbactam (UNASYN) 1.5 g in sodium chloride 0.9 % 100 mL IVPB  Status:  Discontinued     1.5 g 200 mL/hr over 30 Minutes Intravenous Every 8 hours 03/24/19 0852 03/29/19 1129   03/31/2019 1453  ceFAZolin (ANCEF) 2-4 GM/100ML-% IVPB    Note to Pharmacy: Novella Rob   : cabinet override      03/21/2019 1453 04/03/2019 1833       Objective: Vitals:   04/10/19 2104 04/10/19 2346 04/11/19 0509 04/11/19 0727  BP: 120/62 (!) 149/75 135/64 (!) 150/85  Pulse: 82 72 (!) 58 74  Resp: 20 (!) _0 Temp: 99 F (37.2 C) 98.4 F (36.9 C) (!) 97.4 F (36.3 C) 98 F (36.7 C)  TempSrc: Axillary  Oral Oral  SpO2: 98%   97%  Weight:      Height:        Intake/Output Summary (Last 24 hours) at 04/11/2019 1214 Last data filed at 04/11/2019 0900 Gross per 24 hour  Intake --  Output 1150 ml  Net -1150 ml   Filed Weights   04/02/19 0500 04/07/19 0600 04/09/19 0401  Weight: 84.7 kg 86.2 kg 86.2 kg    Examination: General exam: Appears uncomfortable but not in distress Respiratory system: Clear to auscultation. Respiratory effort normal. Cardiovascular system: S1 & S2 heard, RRR. No pedal edema. Gastrointestinal system: Abdomen is nondistended, + cortrack in place Extremities: Symmetric in appearance  bilaterally  Skin: No rashes, lesions or ulcers on exposed skin    Data Reviewed: I have personally reviewed following labs and imaging studies  CBC: Recent Labs  Lab 04/05/19 0735 04/06/19 0403 04/07/19 0404 04/08/19 0627  WBC 8.3 8.1 7.5 8.1  NEUTROABS 6.5 5.6 5.2 5.8  HGB 10.9* 11.2* 11.6* 11.8*  HCT 34.3* 34.6* 36.4* 37.6*  MCV 101.5* 100.3* 100.8* 101.9*  PLT 286 313 355 196   Basic Metabolic Panel: Recent Labs  Lab 04/05/19 0735 04/06/19 0403 04/07/19 0404 04/08/19 0627 04/09/19 0106 04/10/19 0338 04/11/19 0317  NA 142   < > 148* 152* 153* 148* 147*  K 3.4*   < > 3.5 3.7 3.8 3.3* 4.1  CL 110   < > 114* 117* 117* 114* 112*  CO2 23   < > _1 GLUCOSE 135*   < > 156* 147* 119* 151* 122*  BUN 22   < > 25* 32* 37* 37* 32*  CREATININE 1.13   < >  1.31* 1.32* 1.42* 1.31* 1.25*  CALCIUM 8.9   < > 9.6 10.1 9.8 9.6 9.7  MG  --   --   --   --   --   --  2.4  PHOS 2.5  --   --   --   --   --   --    < > = values in this interval not displayed.   GFR: Estimated Creatinine Clearance: 45.2 mL/min (A) (by C-G formula based on SCr of 1.25 mg/dL (H)). Liver Function Tests: No results for input(s): AST, ALT, ALKPHOS, BILITOT, PROT, ALBUMIN in the last 168 hours. No results for input(s): LIPASE, AMYLASE in the last 168 hours. No results for input(s): AMMONIA in the last 168 hours. Coagulation Profile: No results for input(s): INR, PROTIME in the last 168 hours. Cardiac Enzymes: No results for input(s): CKTOTAL, CKMB, CKMBINDEX, TROPONINI in the last 168 hours. BNP (last 3 results) No results for input(s): PROBNP in the last 8760 hours. HbA1C: No results for input(s): HGBA1C in the last 72 hours. CBG: Recent Labs  Lab 04/10/19 2049 04/10/19 2343 04/11/19 0508 04/11/19 0720 04/11/19 1132  GLUCAP 132* 132* 115* 142* 104*   Lipid Profile: No results for input(s): CHOL, HDL, LDLCALC, TRIG, CHOLHDL, LDLDIRECT in the last 72 hours. Thyroid Function Tests: No  results for input(s): TSH, T4TOTAL, FREET4, T3FREE, THYROIDAB in the last 72 hours. Anemia Panel: No results for input(s): VITAMINB12, FOLATE, FERRITIN, TIBC, IRON, RETICCTPCT in the last 72 hours. Sepsis Labs: No results for input(s): PROCALCITON, LATICACIDVEN in the last 168 hours.  Recent Results (from the past 240 hour(s))  C difficile quick scan w PCR reflex     Status: None   Collection Time: 04/04/19  1:21 PM   Specimen: STOOL  Result Value Ref Range Status   C Diff antigen NEGATIVE NEGATIVE Final   C Diff toxin NEGATIVE NEGATIVE Final   C Diff interpretation No C. difficile detected.  Final    Comment: Performed at [facility], [place] [address], [place],  [address]      Radiology Studies: No results found.    Scheduled Meds: . amLODipine  5 mg Per Tube Daily  . aspirin  81 mg Per Tube Daily  . atorvastatin  40 mg Per Tube q1800  . chlorhexidine gluconate (MEDLINE KIT)  15 mL Mouth Rinse BID  . Chlorhexidine Gluconate Cloth  6 each Topical Daily  . donepezil  5 mg Per Tube QHS  . doxazosin  1 mg Per Tube Daily  . enoxaparin (LOVENOX) injection  40 mg Subcutaneous Q24H  . feeding supplement (PRO-STAT SUGAR FREE 64)  30 mL Per Tube TID  . folic acid  1 mg Per Tube Daily  . free water  200 mL Per Tube Q6H  . Gerhardt's butt cream   Topical BID  . insulin aspart  0-9 Units Subcutaneous Q4H  . mouth rinse  15 mL Mouth Rinse 10 times per day  . metoprolol tartrate  75 mg Per Tube BID  . pantoprazole sodium  40 mg Per Tube BID  . QUEtiapine  25 mg Oral QHS  . thiamine  100 mg Per Tube Daily  . ticagrelor  90 mg Oral BID   Or  . ticagrelor  90 mg Per Tube BID   Continuous Infusions: . sodium chloride    . sodium chloride Stopped (04/01/19 1509)  . dextrose Stopped (04/09/19 1134)  . feeding supplement (JEVITY 1.5 CAL/FIBER) 1,000 mL (04/08/19 0145)  LOS: 20 days      Time spent: 51mnutes   JDessa Phi DO [facility] 04/11/2019,  12:14 PM   Available via Epic secure chat 7am-7pm After these hours, please refer to coverage provider listed on [email or web address]

## 2019-04-11 NOTE — Progress Notes (Signed)
Observed patient OOB in recliner with restraints and sitter. Patient tolerated recliner well but still very anxious and disoriented.

## 2019-04-11 NOTE — Progress Notes (Signed)
[facility]  Met with family and PMT at the bedside to answer questions and provide support.  Family is divided on what the goals for Jonathan Berger is at this time.  Family would like to pursue PT/ST/OT to see if any increased functionality would occur.    Discussed that they can request hospice at any time and [facility] will continue to follow while hospitalized and held with andy d/c need (palliaitve vs hospice).  Please call with any questions. Venia Carbon RN, BSN, [facility] Liaison [phone]

## 2019-04-12 LAB — BASIC METABOLIC PANEL
Anion gap: 9 (ref 5–15)
BUN: 33 mg/dL — ABNORMAL HIGH (ref 8–23)
CO2: 25 mmol/L (ref 22–32)
Calcium: 9.3 mg/dL (ref 8.9–10.3)
Chloride: 112 mmol/L — ABNORMAL HIGH (ref 98–111)
Creatinine, Ser: 1.36 mg/dL — ABNORMAL HIGH (ref 0.61–1.24)
GFR calc Af Amer: 54 mL/min — ABNORMAL LOW (ref >60)
GFR calc non Af Amer: 47 mL/min — ABNORMAL LOW (ref >60)
Glucose, Bld: 137 mg/dL — ABNORMAL HIGH (ref 70–99)
Potassium: 3.4 mmol/L — ABNORMAL LOW (ref 3.5–5.1)
Sodium: 146 mmol/L — ABNORMAL HIGH (ref 135–145)

## 2019-04-12 LAB — GLUCOSE, CAPILLARY
Glucose-Capillary: 117 mg/dL — ABNORMAL HIGH (ref 70–99)
Glucose-Capillary: 126 mg/dL — ABNORMAL HIGH (ref 70–99)
Glucose-Capillary: 132 mg/dL — ABNORMAL HIGH (ref 70–99)
Glucose-Capillary: 134 mg/dL — ABNORMAL HIGH (ref 70–99)
Glucose-Capillary: 136 mg/dL — ABNORMAL HIGH (ref 70–99)
Glucose-Capillary: 179 mg/dL — ABNORMAL HIGH (ref 70–99)

## 2019-04-12 MED ORDER — POTASSIUM CHLORIDE 20 MEQ/15ML (10%) PO SOLN
40.0000 meq | Freq: Once | ORAL | Status: AC
  Administered 2019-04-12: 40 meq
  Filled 2019-04-12: qty 30

## 2019-04-12 NOTE — Progress Notes (Addendum)
Palliative Medicine Inpatient Follow Up Note  HPI: Per hospitalist note --> 61 yr oldmale with history of hypertension, who was admitted by Dr. Cheral Berger on 04/04/2019 for dense right ischemic MCA CVA, s/p TPA and VIR intervention with balloon angioplasty and stent placement at proximal R ICA.Ptdeveloped right lower lobe aspiration pneumonitis after extubation on 2/11/2021and was later re-intubated on 2/15 and extubated on 2/17 . Ptwas alsofound to have new systolic CHF with EF 27%.Pt had been managed in the ICU and transferred to the floor, [facility] assumed care on 04/03/19.  Today's Discussion (04/12/2019): Chart reviewed. Met with Jonathan Berger, he is up in the chair with his daughter, Jonathan Berger and wife, Jonathan Berger at bedside. Jonathan Berger asked me why speech therapy had come without her being present. I told her that I was not aware of them coming by but I'd be happy to follow up. She vocalized that her goal is for her or a family member to be present during evaluation in order for it to go along more successfully. I told her that I would not guarantee that speech therapy would be able to come by daily as they have many other patients. Jonathan Berger said that she understood this but that she needs speech/PT/OT to work with her father so she knows by Tuesday which direction to travel in terms of hospice or home with home health.  Jonathan Berger and Jonathan Berger seem extremely hopeful for recovery. I shared with them they to me they do not seem ready for hospice. I told Jonathan Berger that she should try to solidify a full time caregiver at home because when Jonathan Berger does go home it will be a lot harder than she may anticipate. She shares that she does not want to take him home until she has "everything in place" and that Jonathan Berger is "stable". I continue to share with Jonathan Berger and her family that he will be a different man when he goes home and nothing like he was before in terms of independence. He will now require 24/7 care. Jonathan Berger said that she understands  this.   I continue to reinforce that Jonathan Berger may improve but also may rapidly worsen once he gets home, especially if he cannot maintain himself with oral nutrition. I worry that perhaps the family is not hearing these concerns by the medical team. We will continue to support them however that may be. I do feel if the family is leaning towards home with home health that we should get started on ordering DME and arranging caregivers now. I will asked SW to stop by to help with this process.   I was able to help assist Jonathan Berger into bed. He is immensely forgetful and impulsive all congruent with delirium in the setting of his recent right MCA infarction.   Speech therapy plan to come again this afternoon.   All questions answered.   Vital Signs Vitals:   04/12/19 0820 04/12/19 1228  BP: 113/66 (!) 92/54  Pulse: 77 61  Resp: 18 18  Temp: 98.5 F (36.9 C) 97.9 F (36.6 C)  SpO2: 100% 96%    Intake/Output Summary (Last 24 hours) at 04/12/2019 1518 Last data filed at 04/12/2019 1139 Gross per 24 hour  Intake --  Output 1550 ml  Net -1550 ml   Last Weight  Most recent update: 04/09/2019  4:03 AM   Weight  86.2 kg (190 lb 0.6 oz)           Physical Exam Vitals and nursing note reviewed.  HENT:  Head: Normocephalic.     Nose:     Comments: Coretrack in place    Mouth/Throat:     Mouth: Mucous membranes are dry.  Eyes:     Pupils: Pupils are equal, round, and reactive to light.  Cardiovascular:     Rate and Rhythm: Tachycardia present. Rhythm irregular.  Musculoskeletal:     Cervical back: Normal range of motion.  Skin:    General: Skin is warm.  Neurological:     Mental Status: He is alert. He is disoriented.  Psychiatric:         Judgment: Judgment is impulsive.   SUMMARY OF RECOMMENDATIONS   DNR/DNI  Ongoing [place] conversations, plan to follow up on Tuesday morning to get a better impression of if the patient will go home with [facility] or home with hospice  TOC --> SW to help  get caregiver resources  Appreciate PT/OT/Speech therapy insights on patients rehab potential  Time Started: 1330 AM Time Ended: 1450 AM Time Spent: 70 minutes Greater than 50% of the time was spent in counseling and coordination of care ______________________________________________________________________________________ Jonathan Berger Team Team Cell Phone: [phone] Please utilize secure chat with additional questions, if there is no response within 30 minutes please call the above phone number  Palliative Medicine Team providers are available by phone from 7am to 7pm daily and can be reached through the team cell phone.  Should this patient require assistance outside of these hours, please call the patient's attending physician.

## 2019-04-12 NOTE — Progress Notes (Signed)
  Speech Language Pathology Treatment: Dysphagia  Patient Details Name: Jonathan Berger MRN: [id number] DOB: Aug 03, 1931 Today's Date: 04/12/2019 Time: 6948-5462 SLP Time Calculation (min) (ACUTE ONLY): 14 min  Assessment / Plan / Recommendation Clinical Impression  Pt was seen for skilled ST targeting PO readiness.  Pt was encountered awake/alert with bilateral mitts and restraints in place.  Spoke with pt's daughter yesterday who stated that she would be at bedside at 10am today; however, she was not present during this tx session.  Pt was more alert than in previous sessions; however, he was also more agitated and combative.  Pt was observed to have wet, gurgling vocal quality upon SLP arrival with continued suspicions for poor secretion management and possible aspiration of his secretions.  Pt's oral cavity was observed to be significantly dry with thick, dried secretions observed on his lingual surface, labial surfaces, and on the surface of his hard and soft palate.  RN reported that pt is receiving frequent oral care but that he is not tolerating it well.  Attempted oral care prior to PO trials; however, it was abbreviated secondary to pt refusal via clamping his lips shut and turning his head away from the SLP.  Pt consumed trials of thin liquid and puree via 1/2 tsp.  AP transport and swallow initiation appeared to be more timely with trials in this session; however continued to observe s/sx of aspiration including throat clear and increased wet vocal quality with all PO trials.  Mild oral residue was observed on the pt's anterior lingual surface and in his R buccal cavity following puree trials.  Pt may benefit from a MBS to further evaluate swallow function; however, do not believe that he would be able to safely tolerate the study given his current mentation and need for restraints.  Recommend continuation of NPO with frequent oral care (at least 4x per day).  Pt may have a few sips of water PRN  at daughter's request with known risk of aspiration.  SLP will continue to f/u per POC.     HPI HPI: Pt is an 86 year old male presenting 2/7 with slurred speech and dysphagia, found to have acute R MCA stroke s/p tPA and EVR R MCA and ICA with stent. ETT 2/7-2/11. PMH: HTN. Intubated 2/7-2/11.      SLP Plan  Continue with current plan of care       Recommendations  Diet recommendations: NPO Liquids provided via: Teaspoon Medication Administration: Via alternative means Supervision: Full supervision/cueing for compensatory strategies Compensations: Minimize environmental distractions Postural Changes and/or Swallow Maneuvers: Seated upright 90 degrees                Oral Care Recommendations: Staff/trained caregiver to provide oral care;Oral care QID;Oral care prior to ice chip/H20 Follow up Recommendations: 24 hour supervision/assistance;Skilled Nursing facility SLP Visit Diagnosis: Dysphagia, unspecified (R13.10) Plan: Continue with current plan of care                      Villa Herb M.S., CCC-SLP Acute Rehabilitation Services Office: [phone]  Shanon Rosser [facility] 04/12/2019, 11:04 AM

## 2019-04-12 NOTE — Progress Notes (Signed)
  Speech Language Pathology Treatment: Dysphagia  Patient Details Name: Jonathan Berger MRN: [id number] DOB: 1931/10/19 Today's Date: 04/12/2019 Time: 7253-6644 SLP Time Calculation (min) (ACUTE ONLY): 21 min  Assessment / Plan / Recommendation Clinical Impression  Received call from RN requesting that SLP return for a second visit today as daughter now present and patient calm. Patient in fact alert and calm, continues to be confused, requiring max cueing from SLP to follow commands but willingly opens mouth for po intake. Oral care provided with demonstration to daughter. Vocal quality mildly wet at baseline indicative of decreased secretion management with potential penetration and/or aspiration. Fluctuating levels of wet vocal quality as well as intermittent throat clearing noted during trials of ice chips. S/s of aspiration increased to forceful cough response with thin liquids provided via tsp in which patient was noted to have significantly decreased oral awareness and control of bolus with obvious delay in swallow initiation and aspiration. Education provided to daughter who is eager for patient to resume pos if able. Given that cognitive status does appear to improve with family present, recommend proceeding with MBS with daughter present on 3/1. Daughter will be here at 10:00 am. Will schedule. REcommend NPO except ice chips after oral care pending evaluation.    HPI HPI: Pt is an 86 year old male presenting 2/7 with slurred speech and dysphagia, found to have acute R MCA stroke s/p tPA and EVR R MCA and ICA with stent. ETT 2/7-2/11. PMH: HTN. Intubated 2/7-2/11.      SLP Plan  MBS       Recommendations  Diet recommendations: NPO(except ice chips after oral care) Liquids provided via: Teaspoon Medication Administration: Via alternative means Supervision: Full supervision/cueing for compensatory strategies Compensations: Minimize environmental distractions Postural Changes and/or  Swallow Maneuvers: Seated upright 90 degrees                Oral Care Recommendations: Staff/trained caregiver to provide oral care;Oral care QID;Oral care prior to ice chip/H20 Follow up Recommendations: 24 hour supervision/assistance;Skilled Nursing facility SLP Visit Diagnosis: Dysphagia, oropharyngeal phase (R13.12) Plan: MBS       GO              Jonathan Fiebelkorn MA, CCC-SLP    Jonathan Berger Jonathan Berger 04/12/2019, 3:19 PM

## 2019-04-12 NOTE — Progress Notes (Addendum)
PROGRESS NOTE    Jonathan Berger  GMW:[id number] DOB: 1931-07-16 DOA: 04/12/2019 PCP: [name], MD     Brief Narrative:  Jonathan Berger is a 64 yr oldmale with history of hypertension, who was admitted by Dr. [name] on 04/11/2019 for dense right ischemic MCA CVA, s/p TPA and VIR intervention with balloon angioplasty and stent placement at proximal R ICA. Pt developed right lower lobe aspiration pneumonitis after extubation on 2/11 and was later re-intubated on 2/15 and extubated on 2/17 . Pt was also found to have new systolic CHF with EF 44%. Pt had been managed in the ICU and transferred to the floor, TRH assumed care on 04/03/19.  Due to dysphagia, patient remains on tube feed via cortrak.  Palliative care involved in goals of care conversations.  New events last 24 hours / Subjective: More alert this morning, I am unable to decipher what he says.  Remains on restraints this morning.  Assessment & Plan:   Principal Problem:   Middle cerebral artery embolism, right Active Problems:   Acute on chronic respiratory failure with hypoxia (HCC)   Systolic dysfunction   Dehydration   Aspiration pneumonia of right lower lobe due to gastric secretions (HCC)   Tachycardia   Hypoxia   Paroxysmal atrial fibrillation (HCC)   Multifocal atrial tachycardia (HCC)   Palliative care by specialist   Goals of care, counseling/discussion   DNR (do not resuscitate)   Muscular weakness   Dysphagia   Xerostomia   Delirium   Acute hypoxemic respiratory failure likely secondary to recent CVA/aspiration PNA/acute CHF -Status post intubation x2, recently extubated on 04/01/2019 -Completed cefepime and vancomycin x 7 days on 04/05/19  Right MCA infarct s/p tPA and L ICA stent placement -MRI brain with right temporal lobe cortical/subcortical infarct with numerous small scattered R brain infarcts -CTA head and neck showed right MCA LVO mid and distal segment. Right ICA critical stenosis, left ICA  distal bulb string sign, severe B VA origin stenosis -Repeat CT head on 04/06/2019 unremarkable -Echo showed EF 03%, grade 1 diastolic dysfunction -Carotid Doppler showed left ICA 60 to 79% stenosis -LDL 80, A1c 5.9 -Continue Brilinta, aspirin, Lipitor -PT/OT/SLP -?Plan for further left ICA intervention in the future  Acute systolic and diastolic HF Multifocal atrial tachycardia (MAT) -Echo showed EF of 47%, grade 1 diastolic dysfunction -Cardiology following, plan to repeat echo as an outpatient and consider ischemic evaluation if EF remains depressed -Continue metoprolol   Acute urinary retention -S/p foley catheter placement   Hypertension -Continue amlodipine. Valsartan-hydrochlorothiazide on hold  Dysphagia -Currently on tube feeds via cortrak and remains NPO  -SLP, dietitian on board  Dementia -Currently with delirium, likely ICU/hospital delirium -Continue Seroquel, continue Aricept, restraints PRN and sitter at bedside PRN   [place] -Patient with very poor prognosis -Palliative consulted for further goals of care  Hypernatremia -Improving on D10.  Continue to monitor BMP  Hypokalemia -Replace, trend     DVT prophylaxis: Lovenox  Code Status: DNR Family Communication: None at bedside Disposition Plan:  . Patient is from home prior to admission. Marland Kitchen Barrier(s) to discharge include continued tube feeding via cortrak and ongoing goals of care conversation.  Planned for another family meeting Monday with palliative care discuss home health versus home hospice discharge.   Consultants:   Neurology  Critical care  Cardiology  Palliative care   Antimicrobials:  Anti-infectives (From admission, onward)   Start     Dose/Rate Route Frequency Ordered Stop   03/31/19  1100  vancomycin (VANCOCIN) IVPB 1000 mg/200 mL premix     1,000 mg 200 mL/hr over 60 Minutes Intravenous Every 24 hours 03/31/19 1027 04/04/19 1356   03/30/19 1000  ceFEPIme (MAXIPIME) 2 g  in sodium chloride 0.9 % 100 mL IVPB     2 g 200 mL/hr over 30 Minutes Intravenous Every 12 hours 03/30/19 0806 04/04/19 2212   03/29/19 1230  vancomycin (VANCOREADY) IVPB 1750 mg/350 mL  Status:  Discontinued     1,750 mg 175 mL/hr over 120 Minutes Intravenous Every 48 hours 03/29/19 1141 03/31/19 1027   03/29/19 1200  ceFEPIme (MAXIPIME) 2 g in sodium chloride 0.9 % 100 mL IVPB  Status:  Discontinued     2 g 200 mL/hr over 30 Minutes Intravenous Every 24 hours 03/29/19 1129 03/30/19 0806   03/29/19 1130  vancomycin (VANCOCIN) IVPB 1000 mg/200 mL premix  Status:  Discontinued     1,000 mg 200 mL/hr over 60 Minutes Intravenous  Once 03/29/19 1129 03/29/19 1140   03/24/19 0930  ampicillin-sulbactam (UNASYN) 1.5 g in sodium chloride 0.9 % 100 mL IVPB  Status:  Discontinued     1.5 g 200 mL/hr over 30 Minutes Intravenous Every 8 hours 03/24/19 0852 03/29/19 1129   03/16/2019 1453  ceFAZolin (ANCEF) 2-4 GM/100ML-% IVPB    Note to Pharmacy: Novella Rob   : cabinet override      04/02/2019 1453 03/27/2019 1833       Objective: Vitals:   04/12/19 0004 04/12/19 0013 04/12/19 0414 04/12/19 0820  BP: 106/62  115/69 113/66  Pulse: (!) 54  61 77  Resp: '16  18 18  ' Temp: 98.7 F (37.1 C)  97.8 F (36.6 C) 98.5 F (36.9 C)  TempSrc: Axillary  Oral Oral  SpO2: 93% 93% 99% 100%  Weight:      Height:        Intake/Output Summary (Last 24 hours) at 04/12/2019 0959 Last data filed at 04/12/2019 0705 Gross per 24 hour  Intake --  Output 1250 ml  Net -1250 ml   Filed Weights   04/02/19 0500 04/07/19 0600 04/09/19 0401  Weight: 84.7 kg 86.2 kg 86.2 kg    Examination: General exam: Appears confused and agitated Respiratory system: Clear to auscultation anteriorly. Respiratory effort normal. Cardiovascular system: S1 & S2 heard, RRR. No pedal edema. Gastrointestinal system: Abdomen is nondistended, soft with cortrak in place Central nervous system: Alert  Extremities: Symmetric in appearance  bilaterally  Skin: No rashes, lesions or ulcers on exposed skin  Psychiatry: Remains confused  Data Reviewed: I have personally reviewed following labs and imaging studies  CBC: Recent Labs  Lab 04/06/19 0403 04/07/19 0404 04/08/19 0627  WBC 8.1 7.5 8.1  NEUTROABS 5.6 5.2 5.8  HGB 11.2* 11.6* 11.8*  HCT 34.6* 36.4* 37.6*  MCV 100.3* 100.8* 101.9*  PLT 313 355 202   Basic Metabolic Panel: Recent Labs  Lab 04/08/19 0627 04/09/19 0106 04/10/19 0338 04/11/19 0317 04/12/19 0413  NA 152* 153* 148* 147* 146*  K 3.7 3.8 3.3* 4.1 3.4*  CL 117* 117* 114* 112* 112*  CO2 '26 24 26 28 25  ' GLUCOSE 147* 119* 151* 122* 137*  BUN 32* 37* 37* 32* 33*  CREATININE 1.32* 1.42* 1.31* 1.25* 1.36*  CALCIUM 10.1 9.8 9.6 9.7 9.3  MG  --   --   --  2.4  --    GFR: Estimated Creatinine Clearance: 41.5 mL/min (A) (by C-G formula based on SCr of 1.36 mg/dL (H)).  Liver Function Tests: No results for input(s): AST, ALT, ALKPHOS, BILITOT, PROT, ALBUMIN in the last 168 hours. No results for input(s): LIPASE, AMYLASE in the last 168 hours. No results for input(s): AMMONIA in the last 168 hours. Coagulation Profile: No results for input(s): INR, PROTIME in the last 168 hours. Cardiac Enzymes: No results for input(s): CKTOTAL, CKMB, CKMBINDEX, TROPONINI in the last 168 hours. BNP (last 3 results) No results for input(s): PROBNP in the last 8760 hours. HbA1C: No results for input(s): HGBA1C in the last 72 hours. CBG: Recent Labs  Lab 04/11/19 1647 04/11/19 2027 04/12/19 0003 04/12/19 0410 04/12/19 0819  GLUCAP 103* 144* 117* 132* 126*   Lipid Profile: No results for input(s): CHOL, HDL, LDLCALC, TRIG, CHOLHDL, LDLDIRECT in the last 72 hours. Thyroid Function Tests: No results for input(s): TSH, T4TOTAL, FREET4, T3FREE, THYROIDAB in the last 72 hours. Anemia Panel: No results for input(s): VITAMINB12, FOLATE, FERRITIN, TIBC, IRON, RETICCTPCT in the last 72 hours. Sepsis Labs: No results  for input(s): PROCALCITON, LATICACIDVEN in the last 168 hours.  Recent Results (from the past 240 hour(s))  C difficile quick scan w PCR reflex     Status: None   Collection Time: 04/04/19  1:21 PM   Specimen: STOOL  Result Value Ref Range Status   C Diff antigen NEGATIVE NEGATIVE Final   C Diff toxin NEGATIVE NEGATIVE Final   C Diff interpretation No C. difficile detected.  Final    Comment: Performed at [facility], [place] [address], [place], [place] [address]      Radiology Studies: No results found.    Scheduled Meds: . amLODipine  5 mg Per Tube Daily  . aspirin  81 mg Per Tube Daily  . atorvastatin  40 mg Per Tube q1800  . chlorhexidine gluconate (MEDLINE KIT)  15 mL Mouth Rinse BID  . Chlorhexidine Gluconate Cloth  6 each Topical Daily  . donepezil  5 mg Per Tube QHS  . doxazosin  1 mg Per Tube Daily  . enoxaparin (LOVENOX) injection  40 mg Subcutaneous Q24H  . feeding supplement (PRO-STAT SUGAR FREE 64)  30 mL Per Tube TID  . folic acid  1 mg Per Tube Daily  . free water  200 mL Per Tube Q6H  . Gerhardt's butt cream   Topical BID  . insulin aspart  0-9 Units Subcutaneous Q4H  . mouth rinse  15 mL Mouth Rinse 10 times per day  . metoprolol tartrate  75 mg Per Tube BID  . pantoprazole sodium  40 mg Per Tube BID  . QUEtiapine  25 mg Oral QHS  . thiamine  100 mg Per Tube Daily  . ticagrelor  90 mg Oral BID   Or  . ticagrelor  90 mg Per Tube BID   Continuous Infusions: . sodium chloride    . sodium chloride Stopped (04/01/19 1509)  . dextrose Stopped (04/09/19 1134)  . feeding supplement (JEVITY 1.5 CAL/FIBER) 1,000 mL (04/12/19 0739)     LOS: 21 days      Time spent: 27mnutes   JDessa Phi DO [facility] 04/12/2019, 9:59 AM   Available via Epic secure chat 7am-7pm After these hours, please refer to coverage provider listed on [email or web address]

## 2019-04-12 NOTE — TOC Progression Note (Signed)
Transition of Care [facility]) - Progression Note    Patient Details  Name: Jonathan Berger MRN: [id number] Date of Birth: 1931/02/21  Transition of Care [facility]) CM/SW [place], [place] Phone Number: [phone] 04/12/2019, 4:18 PM  Clinical Narrative:    CSW contacted by Palliative Care to provide family with information on Caregiver Resources. CSW met bedside with patient's daughter Brae Gartman [phone] and Marlowe Kays, and provided them with a list of agencies that may can provide additional home care. The family stated they would like patient to go home with Home Health. CSW asked if patient already has any DME in the home and they stated he does not. They stated they would like to start the process of getting their home prepared for patient to return.   CSW spoke with Tacey Ruiz of Palliative Care and updated her on the family's wishes.  TOC team will continue to follow and assist with discharge planning needs.   Expected Discharge Plan: Home w Hospice Care Barriers to Discharge: Continued Medical Work up  Expected Discharge Plan and Services Expected Discharge Plan: [place] Acute Care Choice: Hospice Living arrangements for the past 2 months: Single Family Home                                       Social Determinants of Health (SDOH) Interventions    Readmission Risk Interventions No flowsheet data found.

## 2019-04-13 ENCOUNTER — Inpatient Hospital Stay (HOSPITAL_COMMUNITY): Payer: Medicare Other

## 2019-04-13 DIAGNOSIS — R451 Restlessness and agitation: Secondary | ICD-10-CM

## 2019-04-13 DIAGNOSIS — R627 Adult failure to thrive: Secondary | ICD-10-CM

## 2019-04-13 LAB — BASIC METABOLIC PANEL
Anion gap: 9 (ref 5–15)
BUN: 33 mg/dL — ABNORMAL HIGH (ref 8–23)
CO2: 26 mmol/L (ref 22–32)
Calcium: 9.6 mg/dL (ref 8.9–10.3)
Chloride: 111 mmol/L (ref 98–111)
Creatinine, Ser: 1.28 mg/dL — ABNORMAL HIGH (ref 0.61–1.24)
GFR calc Af Amer: 58 mL/min — ABNORMAL LOW (ref >60)
GFR calc non Af Amer: 50 mL/min — ABNORMAL LOW (ref >60)
Glucose, Bld: 134 mg/dL — ABNORMAL HIGH (ref 70–99)
Potassium: 3.6 mmol/L (ref 3.5–5.1)
Sodium: 146 mmol/L — ABNORMAL HIGH (ref 135–145)

## 2019-04-13 LAB — GLUCOSE, CAPILLARY
Glucose-Capillary: 100 mg/dL — ABNORMAL HIGH (ref 70–99)
Glucose-Capillary: 111 mg/dL — ABNORMAL HIGH (ref 70–99)
Glucose-Capillary: 114 mg/dL — ABNORMAL HIGH (ref 70–99)
Glucose-Capillary: 126 mg/dL — ABNORMAL HIGH (ref 70–99)
Glucose-Capillary: 132 mg/dL — ABNORMAL HIGH (ref 70–99)
Glucose-Capillary: 146 mg/dL — ABNORMAL HIGH (ref 70–99)

## 2019-04-13 NOTE — Progress Notes (Signed)
SLP Cancellation Note  Patient Details Name: Jonathan Berger MRN: [id number] DOB: [birth year]   Cancelled treatment:       Reason Eval/Treat Not Completed: Other (comment) Pt was encountered in radiology with daughter and wife present and pt's other daughter was on the telephone.  Pt had received Ativan earlier this AM and family wished to postpone MBS until tomorrow when pt is more awake/alert.  Spoke with family, radiology, and NP to coordinate time for [facility] tomorrow.  Plan for MBS at 1pm tomorrow with family present to assist with keeping pt calm.  Please do not administer any sedative medication after 11pm tonight (per specific family request) in preparation for tomorrow's MBS.    Shanon Rosser Nili Honda 04/13/2019, 2:58 PM

## 2019-04-13 NOTE — Progress Notes (Signed)
Pt agitated and squirming. Restraint care performed and pt ripped out foley. Tube feeds were held since pt slides down in bed despite restraints. MD at bedside, aware of situation. Will try condom catheter. MD ordering sitter.

## 2019-04-13 NOTE — Progress Notes (Addendum)
Patient ID: Jonathan Berger, male   DOB: 1931/12/24, 86 y.o.   MRN: [id number]  This NP visited patient at the bedside as a follow up for palliative medicine needs, and emotional support.  Called to bedside that family is concerned about patient receiving sedation and its impact on his ability to be successful with a swallow study.  Patient lying in bed restless.  Coretrak in place.  Patient requiring mitts and Posey, and ankle restraints for safety.  His mouth is dry, he is unable to follow commands and he appears generally uncomfortable.  He has audible throat secretions which she is unable to clear, weak cough.   Patient continues to show no signs of meaningful improvement. Prognosis remains poor.   Patient's wife, daughter/[name] and daughter/[name] by telephone at the bedside.  [name] the speech therapist is there to discuss with them the swallow study.  Swallow study was held for today at family request secondary to their believe that his current cognitive state is secondary to the Ativan that he received this morning at 8:30 AM.  Created space and opportunity for family to explore the thoughts and feelings regarding the patient's current medical situation.  [name] and [name] verbalized their frustration and belief that "staff is not listening to [place] and they do not understand my father".  [name] speaks to her belief that the patient is aware of his situation however he is annoyed and agitated by strangers, he that he just wants to go home.  [name] tells me that through the weekend she was here at bedside  with her dad and he was able to be calm, follow commands and appreciate his environment.  I listened and tried to offer emotional support.    I verbalized my worry for the patient's ability to pass the swallow study/with or without sedation on board,  and that in his current state would  not be able to support himself nutritionally now and into the immediate future.  [name]/main decision maker for  the patient family today verbalizes consideration for a feeding tube.  I attempted to offer information regarding comfort feeds.  All family members continue to express their hope to take the patient home.    Plan for today -DNR/DNI -I spoke to charge nurse and a family member has been approved to be at the bedside 24/7.   -Discussed with pharmacy and nursing/ to hold Ativan after 2300 tonight in preparation for swallow study tomorrow.  Family adamant that the medications are what are causing his cognitive changes.  The family wants the patient to have "the best chance at the swallow study" -Continue conversation after swallow study tomorrow for family to again discuss diagnosis, prognosis, goals of care, disposition options and anticipatory care needs.  I shared with family my worry that the patient is not showing signs of improvement and this is a poor prognostic indicator.  Today is day 22 of his hospitalization.    Questions and concerns addressed   Discussed with Dr  Alvino Chapel   Total time spent on the unit was 60  minutes  Greater than 50% of the time was spent in counseling and coordination of care  [name] NP  Palliative Medicine Team Team Phone # [phone] Pager [phone]

## 2019-04-13 NOTE — Progress Notes (Signed)
Overnight, patient continued to produce red- tinged urine. Secretions in throat increased; yankauer ineffective in clearing secretions. Patient remained agitated throughout the night, pulling against restraints. Restraints ordered for safety due to patient pulling on lines and trying to get out of bed; patient is not redirectable. Patient did not sleep all night.

## 2019-04-13 NOTE — Progress Notes (Signed)
  Speech Language Pathology Treatment: Dysphagia  Patient Details Name: Jonathan Berger MRN: [id number] DOB: Feb 01, 1932 Today's Date: 04/13/2019 Time: 4098-1191 SLP Time Calculation (min) (ACUTE ONLY): 33 min  Assessment / Plan / Recommendation Clinical Impression  Spoke with family in depth following cancellation of MBS this afternoon.  Palliative NP was present for the majority of this tx session and she helped to facilitate the conversation between medical providers and family members.  Discussed with family the pt's swallowing abilities and likelihood that he would exhibit aspiration on the MBS even when he does not have sedative medication in his system.  Family stated that they would like for him to have a family member present and for the pt not to receive any sedative medication past 11pm tonight in order to give him the "best shot at the swallow test" tomorrow.  Pt continued to exhibit wet vocal quality with suspicions for decreased secretion management and possible aspiration of his secretions.  NP discussed PEG and comfort feeding with family and one daughter [name]) stated that it would be a consideration while the other daughter stated firmly that she would like to pursue a PEG if the pt is not safe for PO intake after tomorrow's MBS.  Upon observation, pt's oral cavity was dry and thick, yellow secretions were observed on the pt's soft palatal surface and his lingual surface.  Oral care was completed, but it was abbreviated secondary to pt agitation.  RN completed deep suctioning during this session which helped to clear wet vocal quality.  Spoke in depth with SLP who will be completing this pt's MBS tomorrow regarding his POC and family's wishes.     HPI HPI: Pt is an 86 year old male presenting 2/7 with slurred speech and dysphagia, found to have acute R MCA stroke s/p tPA and EVR R MCA and ICA with stent. ETT 2/7-2/11. PMH: HTN. Intubated 2/7-2/11.      SLP Plan  MBS        Recommendations  Diet recommendations: NPO Liquids provided via: Teaspoon Medication Administration: Via alternative means Supervision: Full supervision/cueing for compensatory strategies Compensations: Minimize environmental distractions Postural Changes and/or Swallow Maneuvers: Seated upright 90 degrees                Oral Care Recommendations: Staff/trained caregiver to provide oral care;Oral care QID;Oral care prior to ice chip/H20 Follow up Recommendations: 24 hour supervision/assistance;Skilled Nursing facility SLP Visit Diagnosis: Dysphagia, oropharyngeal phase (R13.12) Plan: MBS                    Villa Herb., M.S., CCC-SLP Acute Rehabilitation Services Office: [phone]  Shanon Rosser [facility] 04/13/2019, 3:19 PM

## 2019-04-13 NOTE — Progress Notes (Signed)
PROGRESS NOTE    Jonathan Berger  AQT:[id number] DOB: Jun 15, 1931 DOA: 03/24/2019 PCP: [name], MD     Brief Narrative:  Jonathan Berger is a 32 yr oldmale with history of hypertension, who was admitted by Dr. [name] on 04/10/2019 for dense right ischemic MCA CVA, s/p TPA and VIR intervention with balloon angioplasty and stent placement at proximal R ICA. Pt developed right lower lobe aspiration pneumonitis after extubation on 2/11 and was later re-intubated on 2/15 and extubated on 2/17 . Pt was also found to have new systolic CHF with EF 56%. Pt had been managed in the ICU and transferred to the floor, TRH assumed care on 04/03/19.  Due to dysphagia, patient remains on tube feed via cortrak.  Palliative care involved in goals of care conversations.  New events last 24 hours / Subjective: Agitated this morning, trying to slide down the bed. Apparently pulled out foley catheter. Not redirectable.   Assessment & Plan:   Principal Problem:   Middle cerebral artery embolism, right Active Problems:   Acute on chronic respiratory failure with hypoxia (HCC)   Systolic dysfunction   Dehydration   Aspiration pneumonia of right lower lobe due to gastric secretions (HCC)   Tachycardia   Hypoxia   Paroxysmal atrial fibrillation (HCC)   Multifocal atrial tachycardia (HCC)   Palliative care by specialist   Goals of care, counseling/discussion   DNR (do not resuscitate)   Muscular weakness   Dysphagia   Xerostomia   Delirium   Acute hypoxemic respiratory failure likely secondary to recent CVA/aspiration PNA/acute CHF -Status post intubation x2, recently extubated on 04/01/2019 -Completed cefepime and vancomycin x 7 days on 04/05/19  Right MCA infarct s/p tPA and L ICA stent placement -MRI brain with right temporal lobe cortical/subcortical infarct with numerous small scattered R brain infarcts -CTA head and neck showed right MCA LVO mid and distal segment. Right ICA critical stenosis,  left ICA distal bulb string sign, severe B VA origin stenosis -Repeat CT head on 04/06/2019 unremarkable -Echo showed EF 25%, grade 1 diastolic dysfunction -Carotid Doppler showed left ICA 60 to 79% stenosis -LDL 80, A1c 5.9 -Continue Brilinta, aspirin, Lipitor -PT/OT/SLP -?Plan for further left ICA intervention in the future  Acute systolic and diastolic HF Multifocal atrial tachycardia (MAT) -Echo showed EF of 63%, grade 1 diastolic dysfunction -Cardiology following, plan to repeat echo as an outpatient and consider ischemic evaluation if EF remains depressed -Continue metoprolol   Acute urinary retention -S/p foley catheter placement. Patient pulled out 3/1  -Bladder scan PRN. May need to replace foley if retaining    Hypertension -Continue amlodipine. Valsartan-hydrochlorothiazide on hold  Dysphagia -Currently on tube feeds via cortrak and remains NPO  -SLP, dietitian on board, MBS pending   Dementia -Currently with delirium, likely ICU/hospital delirium -Continue Seroquel, continue Aricept, restraints PRN and sitter at bedside PRN   [place] -Patient with very poor prognosis -Palliative consulted for further goals of care  Hypernatremia -Improving on D10.  Continue to monitor BMP    DVT prophylaxis: Lovenox  Code Status: DNR Family Communication: None at bedside Disposition Plan:  . Patient is from home prior to admission. Marland Kitchen Barrier(s) to discharge include continued tube feeding via cortrak and ongoing goals of care conversation. Family hoping for home health and discharge home instead of with hospice. They're working with Palliative care team and CM to arrange for what they need for discharge. Continue working with PT OT SLP.    Consultants:   Neurology  Critical care  Cardiology  Palliative care   Antimicrobials:  Anti-infectives (From admission, onward)   Start     Dose/Rate Route Frequency Ordered Stop   03/31/19 1100  vancomycin (VANCOCIN) IVPB  1000 mg/200 mL premix     1,000 mg 200 mL/hr over 60 Minutes Intravenous Every 24 hours 03/31/19 1027 04/04/19 1356   03/30/19 1000  ceFEPIme (MAXIPIME) 2 g in sodium chloride 0.9 % 100 mL IVPB     2 g 200 mL/hr over 30 Minutes Intravenous Every 12 hours 03/30/19 0806 04/04/19 2212   03/29/19 1230  vancomycin (VANCOREADY) IVPB 1750 mg/350 mL  Status:  Discontinued     1,750 mg 175 mL/hr over 120 Minutes Intravenous Every 48 hours 03/29/19 1141 03/31/19 1027   03/29/19 1200  ceFEPIme (MAXIPIME) 2 g in sodium chloride 0.9 % 100 mL IVPB  Status:  Discontinued     2 g 200 mL/hr over 30 Minutes Intravenous Every 24 hours 03/29/19 1129 03/30/19 0806   03/29/19 1130  vancomycin (VANCOCIN) IVPB 1000 mg/200 mL premix  Status:  Discontinued     1,000 mg 200 mL/hr over 60 Minutes Intravenous  Once 03/29/19 1129 03/29/19 1140   03/24/19 0930  ampicillin-sulbactam (UNASYN) 1.5 g in sodium chloride 0.9 % 100 mL IVPB  Status:  Discontinued     1.5 g 200 mL/hr over 30 Minutes Intravenous Every 8 hours 03/24/19 0852 03/29/19 1129   04/07/2019 1453  ceFAZolin (ANCEF) 2-4 GM/100ML-% IVPB    Note to Pharmacy: Novella Rob   : cabinet override      03/26/2019 1453 03/25/2019 1833       Objective: Vitals:   04/12/19 2032 04/13/19 0026 04/13/19 0403 04/13/19 0832  BP: 110/67 (!) 121/98 (!) 110/52 (!) 147/105  Pulse: 63 80 69 83  Resp: _0 Temp: 97.7 F (36.5 C) 98.2 F (36.8 C) 97.9 F (36.6 C) 98.4 F (36.9 C)  TempSrc: Oral Oral Oral Oral  SpO2: 100% 96% (!) 86% 100%  Weight:      Height:        Intake/Output Summary (Last 24 hours) at 04/13/2019 1042 Last data filed at 04/13/2019 0844 Gross per 24 hour  Intake 50 ml  Output 550 ml  Net -500 ml   Filed Weights   04/02/19 0500 04/07/19 0600 04/09/19 0401  Weight: 84.7 kg 86.2 kg 86.2 kg    Examination: General exam: Appears confused Respiratory system: Clear to auscultation. Respiratory effort normal. Cardiovascular system: S1 & S2  heard, RRR. No pedal edema. Gastrointestinal system: Abdomen is nondistended, soft, + cortrak in place Central nervous system: Alert but mumbles incoherently, not redirectable Extremities: Symmetric in appearance bilaterally  Skin: No rashes, lesions or ulcers on exposed skin    Data Reviewed: I have personally reviewed following labs and imaging studies  CBC: Recent Labs  Lab 04/07/19 0404 04/08/19 0627  WBC 7.5 8.1  NEUTROABS 5.2 5.8  HGB 11.6* 11.8*  HCT 36.4* 37.6*  MCV 100.8* 101.9*  PLT 355 283   Basic Metabolic Panel: Recent Labs  Lab 04/09/19 0106 04/10/19 0338 04/11/19 0317 04/12/19 0413 04/13/19 0434  NA 153* 148* 147* 146* 146*  K 3.8 3.3* 4.1 3.4* 3.6  CL 117* 114* 112* 112* 111  CO2 _1 GLUCOSE 119* 151* 122* 137* 134*  BUN 37* 37* 32* 33* 33*  CREATININE 1.42* 1.31* 1.25* 1.36* 1.28*  CALCIUM 9.8 9.6 9.7 9.3 9.6  MG  --   --  2.4  --   --    GFR: Estimated Creatinine Clearance: 44.1 mL/min (A) (by C-G formula based on SCr of 1.28 mg/dL (H)). Liver Function Tests: No results for input(s): AST, ALT, ALKPHOS, BILITOT, PROT, ALBUMIN in the last 168 hours. No results for input(s): LIPASE, AMYLASE in the last 168 hours. No results for input(s): AMMONIA in the last 168 hours. Coagulation Profile: No results for input(s): INR, PROTIME in the last 168 hours. Cardiac Enzymes: No results for input(s): CKTOTAL, CKMB, CKMBINDEX, TROPONINI in the last 168 hours. BNP (last 3 results) No results for input(s): PROBNP in the last 8760 hours. HbA1C: No results for input(s): HGBA1C in the last 72 hours. CBG: Recent Labs  Lab 04/12/19 1602 04/12/19 2035 04/13/19 0030 04/13/19 0502 04/13/19 0832  GLUCAP 134* 136* 111* 126* 146*   Lipid Profile: No results for input(s): CHOL, HDL, LDLCALC, TRIG, CHOLHDL, LDLDIRECT in the last 72 hours. Thyroid Function Tests: No results for input(s): TSH, T4TOTAL, FREET4, T3FREE, THYROIDAB in the last 72  hours. Anemia Panel: No results for input(s): VITAMINB12, FOLATE, FERRITIN, TIBC, IRON, RETICCTPCT in the last 72 hours. Sepsis Labs: No results for input(s): PROCALCITON, LATICACIDVEN in the last 168 hours.  Recent Results (from the past 240 hour(s))  C difficile quick scan w PCR reflex     Status: None   Collection Time: 04/04/19  1:21 PM   Specimen: STOOL  Result Value Ref Range Status   C Diff antigen NEGATIVE NEGATIVE Final   C Diff toxin NEGATIVE NEGATIVE Final   C Diff interpretation No C. difficile detected.  Final    Comment: Performed at [facility], [place] [address] [place], [place] [address]      Radiology Studies: No results found.    Scheduled Meds: . amLODipine  5 mg Per Tube Daily  . aspirin  81 mg Per Tube Daily  . atorvastatin  40 mg Per Tube q1800  . chlorhexidine gluconate (MEDLINE KIT)  15 mL Mouth Rinse BID  . Chlorhexidine Gluconate Cloth  6 each Topical Daily  . donepezil  5 mg Per Tube QHS  . doxazosin  1 mg Per Tube Daily  . enoxaparin (LOVENOX) injection  40 mg Subcutaneous Q24H  . feeding supplement (PRO-STAT SUGAR FREE 64)  30 mL Per Tube TID  . folic acid  1 mg Per Tube Daily  . free water  200 mL Per Tube Q6H  . Gerhardt's butt cream   Topical BID  . insulin aspart  0-9 Units Subcutaneous Q4H  . mouth rinse  15 mL Mouth Rinse 10 times per day  . metoprolol tartrate  75 mg Per Tube BID  . pantoprazole sodium  40 mg Per Tube BID  . QUEtiapine  25 mg Oral QHS  . thiamine  100 mg Per Tube Daily  . ticagrelor  90 mg Oral BID   Or  . ticagrelor  90 mg Per Tube BID   Continuous Infusions: . sodium chloride    . sodium chloride Stopped (04/01/19 1509)  . dextrose 50 mL/hr at 04/12/19 1037  . feeding supplement (JEVITY 1.5 CAL/FIBER) 1,000 mL (04/12/19 0739)     LOS: 22 days      Time spent: 74mnutes   JDessa Phi DO [facility] 04/13/2019, 10:42 AM   Available via Epic secure chat 7am-7pm After these hours, please  refer to coverage provider listed on [email or web address]

## 2019-04-13 NOTE — Progress Notes (Signed)
Physical Therapy Treatment Patient Details Name: Jonathan Berger MRN: [id number] DOB: 05-09-1931 Today's Date: 04/13/2019    History of Present Illness 86 year old presenting 2/7 with slurred speech and dysphagia found to have acute right middle cerebral artery stroke s/p tPA and EVR R MCA and ICA with stent on 03-31-2019.  Extubated 2/11, reintubated 2/15-2/17.    PT Comments    Pt very lethargic from Ativan, family present and still hopeful of taking him home. In current state, pt needed mod A +2 to come to EOB. Practiced seated reaching activities for trunk control and activation of LUE. Performed sit<>stand 2x with max A +2, unable to ambulate due to lethargy. Will attempt to see again tomorrow and he will hopefully be more alert. PT will continue to follow.    Follow Up Recommendations  Supervision/Assistance - 24 hour;SNF(SNF vs Home with HHPT depending on family availability for 24/7 supervision and assist. per pt's daughter, family is wanting him to return home.)     Equipment Recommendations  Other (comment)(to be determined)    Recommendations for Other Services       Precautions / Restrictions Precautions Precautions: Fall Precaution Comments: Cortrak, restraints, mitts, posey Restrictions Weight Bearing Restrictions: No    Mobility  Bed Mobility Overal bed mobility: Needs Assistance Bed Mobility: Supine to Sit;Sit to Supine     Supine to sit: +2 for physical assistance;Mod assist Sit to supine: +2 for physical assistance;Min assist   General bed mobility comments: mod A to initiate LE's off bed and elevation of trunk into sitting. Pt initiated return to supine and was able to complete with min A for LE's into bed  Transfers Overall transfer level: Needs assistance   Transfers: Sit to/from Stand Sit to Stand: +2 physical assistance;Max assist         General transfer comment: pt needed max tactile cueing for initiation of stand. Max A +2 to fully stand EOB.  Performed 2x  Ambulation/Gait             General Gait Details: unable today due to pt lethargy   Stairs             Wheelchair Mobility    Modified Rankin (Stroke Patients Only) Modified Rankin (Stroke Patients Only) Pre-Morbid Rankin Score: No symptoms Modified Rankin: Moderately severe disability     Balance Overall balance assessment: Needs assistance Sitting-balance support: No upper extremity supported;Feet supported Sitting balance-Leahy Scale: Poor Sitting balance - Comments: L lean with occasional pushing RUE, better with R hand in lap. Mod A to maintain sitting except when working on R wt shift, then able to maintain sitting with min-guard.    Standing balance support: Bilateral upper extremity supported;During functional activity Standing balance-Leahy Scale: Poor Standing balance comment: heavily reliant on BUE support                            Cognition Arousal/Alertness: Lethargic;Suspect due to medications Behavior During Therapy: Restless;WFL for tasks assessed/performed Overall Cognitive Status: Impaired/Different from baseline Area of Impairment: Following commands;Safety/judgement;Awareness;Problem solving                 Orientation Level: Disoriented to;Time;Situation;Place Current Attention Level: Focused Memory: Decreased short-term memory Following Commands: Follows one step commands with increased time Safety/Judgement: Decreased awareness of safety;Decreased awareness of deficits Awareness: Intellectual Problem Solving: Slow processing;Requires verbal cues;Requires tactile cues;Difficulty sequencing;Decreased initiation General Comments: pt was awake all night, now medicated and lethargic, aroused somewhat with stimulation  Exercises      General Comments General comments (skin integrity, edema, etc.): worked on reaching activities in sitting with wife in front of pt cueing him to reach for her hand. AROM R,  AAROM L. Pt responded to wife's voice with eyes open but remained lethargic and hard to motivate.       Pertinent Vitals/Pain Pain Assessment: Faces Faces Pain Scale: No hurt    Home Living                      Prior Function            PT Goals (current goals can now be found in the care plan section) Acute Rehab PT Goals Patient Stated Goal: pt's wife and daughter want to take him home PT Goal Formulation: With family Time For Goal Achievement: 04/24/19 Potential to Achieve Goals: Good Progress towards PT goals: Not progressing toward goals - comment(lethargy)    Frequency    Min 4X/week      PT Plan Current plan remains appropriate    Co-evaluation              AM-PAC PT "6 Clicks" Mobility   Outcome Measure  Help needed turning from your back to your side while in a flat bed without using bedrails?: A Little Help needed moving from lying on your back to sitting on the side of a flat bed without using bedrails?: A Lot Help needed moving to and from a bed to a chair (including a wheelchair)?: A Lot Help needed standing up from a chair using your arms (e.g., wheelchair or bedside chair)?: A Lot Help needed to walk in hospital room?: A Lot Help needed climbing 3-5 steps with a railing? : Total 6 Click Score: 12    End of Session Equipment Utilized During Treatment: (posey used as gait belt) Activity Tolerance: Patient limited by lethargy Patient left: in bed;with call bell/phone within reach;with family/visitor present;with restraints reapplied Nurse Communication: Mobility status PT Visit Diagnosis: Other abnormalities of gait and mobility (R26.89);Muscle weakness (generalized) (M62.81);Other symptoms and signs involving the nervous system (R29.898);Unsteadiness on feet (R26.81);Difficulty in walking, not elsewhere classified (R26.2)     Time: 0272-5366 PT Time Calculation (min) (ACUTE ONLY): 37 min  Charges:  $Therapeutic Activity: 23-37  mins                     Lyanne Co, PT  Acute Rehab Services  Pager [phone] Office [phone]    Lawana Chambers Quincee Gittens 04/13/2019, 12:07 PM

## 2019-04-13 NOTE — Plan of Care (Signed)
Plan of care was reviewed with pt and family at bedside and over phone.  Pt continues to slide down in bed. Tube feeds have been stopped for safety reasons. Condom catheter in place, will bladder scan per orders. Pt was unable to perform swallow study. Pt oriented to self.  A family member will stay overnight with patient, Charge RN aware and permission given.  Safety measures in place. Call bell in reach. Will continue to monitor patient.  Problem: Education: Goal: Knowledge of disease or condition will improve Outcome: Progressing Goal: Knowledge of secondary prevention will improve Outcome: Progressing Goal: Knowledge of patient specific risk factors addressed and post discharge goals established will improve Outcome: Progressing Goal: Individualized Educational Video(s) Outcome: Progressing   Problem: Coping: Goal: Will verbalize positive feelings about self Outcome: Progressing Goal: Will identify appropriate support needs Outcome: Progressing   Problem: Health Behavior/Discharge Planning: Goal: Ability to manage health-related needs will improve Outcome: Progressing   Problem: Self-Care: Goal: Verbalization of feelings and concerns over difficulty with self-care will improve Outcome: Progressing Goal: Ability to communicate needs accurately will improve Outcome: Progressing   Problem: Nutrition: Goal: Risk of aspiration will decrease Outcome: Progressing Goal: Dietary intake will improve Outcome: Progressing   Problem: Ischemic Stroke/TIA Tissue Perfusion: Goal: Complications of ischemic stroke/TIA will be minimized Outcome: Progressing   Problem: Education: Goal: Knowledge of General Education information will improve Description: Including pain rating scale, medication(s)/side effects and non-pharmacologic comfort measures Outcome: Progressing   Problem: Health Behavior/Discharge Planning: Goal: Ability to manage health-related needs will improve Outcome:  Progressing   Problem: Clinical Measurements: Goal: Ability to maintain clinical measurements within normal limits will improve Outcome: Progressing Goal: Will remain free from infection Outcome: Progressing Goal: Diagnostic test results will improve Outcome: Progressing Goal: Respiratory complications will improve Outcome: Progressing Goal: Cardiovascular complication will be avoided Outcome: Progressing   Problem: Activity: Goal: Risk for activity intolerance will decrease Outcome: Progressing   Problem: Nutrition: Goal: Adequate nutrition will be maintained Outcome: Progressing   Problem: Coping: Goal: Level of anxiety will decrease Outcome: Progressing   Problem: Elimination: Goal: Will not experience complications related to bowel motility Outcome: Progressing Goal: Will not experience complications related to urinary retention Outcome: Progressing   Problem: Pain Managment: Goal: General experience of comfort will improve Outcome: Progressing   Problem: Safety: Goal: Ability to remain free from injury will improve Outcome: Progressing   Problem: Skin Integrity: Goal: Risk for impaired skin integrity will decrease Outcome: Progressing

## 2019-04-13 NOTE — Progress Notes (Signed)
Pt was bladder  Scanned, for urine>999, I and O yielding , will continue to monitor, On call notified of retention.

## 2019-04-13 NOTE — Progress Notes (Signed)
Nutrition Follow-up  **RD working remotely**  DOCUMENTATION CODES:   Not applicable  INTERVENTION:  Due to pt sliding down in bed, recommend placing abdomen belt to restrain patient and recommend elevating pt's feet.  Recommend resuming tube feeding:  Jevity 1.5 @ 50 ml/hr (1200 ml/day) with 30 ml Pro-stat TID via tube   Provides 2100 kcal, 121 grams of protein, and 912 ml free water (1712 ml total water with flushes)  200 ml free water flush every 6 hrs per MD  If concerned for aspiration, recommend consulting Cortrak team to advance tube post-pyloric.  NUTRITION DIAGNOSIS:   Inadequate oral intake related to inability to eat as evidenced by NPO status.  Ongoing.  GOAL:   Patient will meet greater than or equal to 90% of their needs  Currently not met, tube feeding being held.  MONITOR:   Labs, Weight trends, I & O's, Vent status, TF tolerance, Skin, Diet advancement  REASON FOR ASSESSMENT:   Consult Enteral/tube feeding initiation and management  ASSESSMENT:   86 year old male with past medical history of HTN , anemia, and chronic back pain presented to ED with slurred speech and left facial droop. CTA showed right ICA near occulusion with distal M1 thrombus extending into M2 branch.  2/7 IR - R MCA embolization and revascularization 2/8 TEE - Left EF ~45% 2/9 CXR - new RLL infiltrate vs atelectasis 2/11ptextubated 2/12 Cortrak - tube just inside pylorus 2/14 RRT called d/t increased respirations, decreased mental status 2/15 pt re-intubated for respiratory insufficiency  2/17 pt extubated  Pt noted to be agitated.   Discussed pt with RN. Per RN, tube feeds held due to pt sliding down in bed despite restraints; MD ordering sitter.  Per RN, pt unable to complete MBS. Sent secure chat to RN with recommendations to place abdominal restraint on pt and elevate feet to help keep pt from sliding. Recommend re-initiating tube feeding and if concern for  aspiration, can consult Cortrak team to advance tube post-pyloric.   Current tube feeding order: 19m Pro-stat TID, 2056mfree water Q6, Jevity 1.5 cal @ 5069mr  Medications reviewed and include: Folvite, SSI, thiamine Labs reviewed: Na 146 (H) CBGs 111-146  UOP: 950m49m4 hours I/O: -3,522.8ml 12mce admit  Diet Order:   Diet Order            Diet NPO time specified  Diet effective now              EDUCATION NEEDS:   No education needs have been identified at this time  Skin:  Skin Assessment: Reviewed RN Assessment  Last BM:  2/24  Height:   Ht Readings from Last 1 Encounters:  03/23/2019 _0  (1.803 m)    Weight:   Wt Readings from Last 1 Encounters:  04/09/19 86.2 kg    BMI:  Body mass index is 26.5 kg/m.  Estimated Nutritional Needs:   Kcal:  1900-2100  Protein:  110-125  Fluid:  >/= 1.9 L/day   AmandLarkin Ina RD, LDN RD pager number and weekend/on-call pager number located in Am[employer]Wilson

## 2019-04-13 DEATH — deceased

## 2019-04-14 ENCOUNTER — Inpatient Hospital Stay (HOSPITAL_COMMUNITY): Payer: Medicare Other

## 2019-04-14 DIAGNOSIS — Z9189 Other specified personal risk factors, not elsewhere classified: Secondary | ICD-10-CM

## 2019-04-14 LAB — BASIC METABOLIC PANEL
Anion gap: 11 (ref 5–15)
BUN: 27 mg/dL — ABNORMAL HIGH (ref 8–23)
CO2: 21 mmol/L — ABNORMAL LOW (ref 22–32)
Calcium: 9.3 mg/dL (ref 8.9–10.3)
Chloride: 112 mmol/L — ABNORMAL HIGH (ref 98–111)
Creatinine, Ser: 1.27 mg/dL — ABNORMAL HIGH (ref 0.61–1.24)
GFR calc Af Amer: 59 mL/min — ABNORMAL LOW (ref >60)
GFR calc non Af Amer: 51 mL/min — ABNORMAL LOW (ref >60)
Glucose, Bld: 134 mg/dL — ABNORMAL HIGH (ref 70–99)
Potassium: 3.3 mmol/L — ABNORMAL LOW (ref 3.5–5.1)
Sodium: 144 mmol/L (ref 135–145)

## 2019-04-14 LAB — GLUCOSE, CAPILLARY
Glucose-Capillary: 104 mg/dL — ABNORMAL HIGH (ref 70–99)
Glucose-Capillary: 109 mg/dL — ABNORMAL HIGH (ref 70–99)
Glucose-Capillary: 111 mg/dL — ABNORMAL HIGH (ref 70–99)
Glucose-Capillary: 118 mg/dL — ABNORMAL HIGH (ref 70–99)
Glucose-Capillary: 127 mg/dL — ABNORMAL HIGH (ref 70–99)
Glucose-Capillary: 135 mg/dL — ABNORMAL HIGH (ref 70–99)

## 2019-04-14 MED ORDER — QUETIAPINE FUMARATE 25 MG PO TABS
25.0000 mg | ORAL_TABLET | Freq: Every day | ORAL | Status: DC
  Administered 2019-04-15 – 2019-04-16 (×2): 25 mg
  Filled 2019-04-14 (×2): qty 1

## 2019-04-14 MED ORDER — TAMSULOSIN HCL 0.4 MG PO CAPS
0.4000 mg | ORAL_CAPSULE | Freq: Every day | ORAL | Status: DC
  Filled 2019-04-14: qty 1

## 2019-04-14 MED ORDER — KCL IN DEXTROSE-NACL 40-5-0.45 MEQ/L-%-% IV SOLN
Freq: Once | INTRAVENOUS | Status: AC
  Filled 2019-04-14: qty 1000

## 2019-04-14 MED ORDER — RESOURCE THICKENUP CLEAR PO POWD
ORAL | Status: DC | PRN
  Filled 2019-04-14: qty 125

## 2019-04-14 MED ORDER — LORAZEPAM 2 MG/ML IJ SOLN
1.0000 mg | Freq: Every day | INTRAMUSCULAR | Status: DC
  Administered 2019-04-14 – 2019-04-16 (×3): 1 mg via INTRAVENOUS
  Filled 2019-04-14 (×4): qty 1

## 2019-04-14 MED ORDER — FINASTERIDE 5 MG PO TABS
5.0000 mg | ORAL_TABLET | Freq: Every day | ORAL | Status: DC
  Administered 2019-04-15 – 2019-04-17 (×3): 5 mg via ORAL
  Filled 2019-04-14 (×3): qty 1

## 2019-04-14 NOTE — Progress Notes (Addendum)
PROGRESS NOTE    Jonathan Berger  TFT:[id number] DOB: 09-15-1931 DOA: 03/20/2019 PCP: [name], MD     Brief Narrative:  Jonathan Berger is a 27 yr oldmale with history of hypertension, who was admitted by Dr. [name] on 03/19/2019 for dense right ischemic MCA CVA, s/p TPA and VIR intervention with balloon angioplasty and stent placement at proximal R ICA. Pt developed right lower lobe aspiration pneumonitis after extubation on 2/11 and was later re-intubated on 2/15 and extubated on 2/17 . Pt was also found to have new systolic CHF with EF 70%. Pt had been managed in the ICU and transferred to the floor, TRH assumed care on 04/03/19.  Due to dysphagia, patient remains on tube feed via cortrak.  Palliative care involved in goals of care conversations.  New events last 24 hours / Subjective: Patient continues to exhibit confusion, delirium.  Cortrak in place.  Patient is in four-point soft restraints as well as abdominal belt.  He is confused, incoherent although alert.  He does not appear to be in any distress.  Assessment & Plan:   Principal Problem:   Middle cerebral artery embolism, right Active Problems:   Acute on chronic respiratory failure with hypoxia (HCC)   Systolic dysfunction   Dehydration   Aspiration pneumonia of right lower lobe due to gastric secretions (HCC)   Tachycardia   Hypoxia   Paroxysmal atrial fibrillation (HCC)   Multifocal atrial tachycardia (HCC)   Palliative care by specialist   Goals of care, counseling/discussion   DNR (do not resuscitate)   Muscular weakness   Dysphagia   Xerostomia   Delirium   Adult failure to thrive   Agitation   Acute hypoxemic respiratory failure likely secondary to recent CVA/aspiration PNA/acute CHF -Status post intubation x2, recently extubated on 04/01/2019 -Completed cefepime and vancomycin x 7 days on 04/05/19  Right MCA infarct s/p tPA and L ICA stent placement -MRI brain with right temporal lobe  cortical/subcortical infarct with numerous small scattered R brain infarcts -CTA head and neck showed right MCA LVO mid and distal segment. Right ICA critical stenosis, left ICA distal bulb string sign, severe B VA origin stenosis -Repeat CT head on 04/06/2019 unremarkable -Echo showed EF 62%, grade 1 diastolic dysfunction -Carotid Doppler showed left ICA 60 to 79% stenosis -LDL 80, A1c 5.9 -Continue Brilinta, aspirin, Lipitor -PT/OT/SLP -?Plan for further left ICA intervention in the future  Acute systolic and diastolic HF Multifocal atrial tachycardia (MAT) -Echo showed EF of 37%, grade 1 diastolic dysfunction -Cardiology following, plan to repeat echo as an outpatient and consider ischemic evaluation if EF remains depressed -Continue metoprolol   Acute urinary retention -S/p foley catheter placement. Patient pulled out 3/1  -Replace Foley catheter due to urinary retention. Start flomax. Hopeful we can complete voiding trial prior to discharge to avoid discharge with foley   Hypertension -Continue amlodipine. Valsartan-hydrochlorothiazide on hold  Dysphagia -Currently on tube feeds via cortrak and remains NPO  -SLP, dietitian on board, MBS pending   Dementia -Currently with delirium, likely ICU/hospital delirium -Continue Seroquel, continue Aricept, restraints PRN   GOC -Patient with very poor prognosis -Palliative consulted for further goals of care  Hypernatremia -Resolved  Hypokalemia -Replace, trend    DVT prophylaxis: Lovenox  Code Status: DNR Family Communication: Wife and daughter at bedside Disposition Plan:  . Patient is from home prior to admission. Marland Kitchen Barrier(s) to discharge include continued tube feeding via cortrak and ongoing goals of care conversation. Family hoping for home  health and discharge home instead of with hospice. They're working with Palliative care team and CM to arrange for what they need for discharge. Continue working with PT OT  SLP.  Family now reconsidering PEG tube placement and is interested if patient cannot safely swallow pending this afternoon's MBS.   Consultants:   Neurology  Critical care  Cardiology  Palliative care   Antimicrobials:  Anti-infectives (From admission, onward)   Start     Dose/Rate Route Frequency Ordered Stop   03/31/19 1100  vancomycin (VANCOCIN) IVPB 1000 mg/200 mL premix     1,000 mg 200 mL/hr over 60 Minutes Intravenous Every 24 hours 03/31/19 1027 04/04/19 1356   03/30/19 1000  ceFEPIme (MAXIPIME) 2 g in sodium chloride 0.9 % 100 mL IVPB     2 g 200 mL/hr over 30 Minutes Intravenous Every 12 hours 03/30/19 0806 04/04/19 2212   03/29/19 1230  vancomycin (VANCOREADY) IVPB 1750 mg/350 mL  Status:  Discontinued     1,750 mg 175 mL/hr over 120 Minutes Intravenous Every 48 hours 03/29/19 1141 03/31/19 1027   03/29/19 1200  ceFEPIme (MAXIPIME) 2 g in sodium chloride 0.9 % 100 mL IVPB  Status:  Discontinued     2 g 200 mL/hr over 30 Minutes Intravenous Every 24 hours 03/29/19 1129 03/30/19 0806   03/29/19 1130  vancomycin (VANCOCIN) IVPB 1000 mg/200 mL premix  Status:  Discontinued     1,000 mg 200 mL/hr over 60 Minutes Intravenous  Once 03/29/19 1129 03/29/19 1140   03/24/19 0930  ampicillin-sulbactam (UNASYN) 1.5 g in sodium chloride 0.9 % 100 mL IVPB  Status:  Discontinued     1.5 g 200 mL/hr over 30 Minutes Intravenous Every 8 hours 03/24/19 0852 03/29/19 1129   04/01/2019 1453  ceFAZolin (ANCEF) 2-4 GM/100ML-% IVPB    Note to Pharmacy: Novella Rob   : cabinet override      04/05/2019 1453 03/21/2019 1833       Objective: Vitals:   04/13/19 2339 04/14/19 0410 04/14/19 0748 04/14/19 1129  BP: 99/62 114/79 130/85 121/88  Pulse: 63 74 84 (!) 57  Resp: '18 18 20 18  ' Temp: 98.7 F (37.1 C) 98.2 F (36.8 C) 98.5 F (36.9 C) 98.7 F (37.1 C)  TempSrc: Axillary Axillary Oral Axillary  SpO2: 92% 95% 99% 96%  Weight:      Height:        Intake/Output Summary (Last 24  hours) at 04/14/2019 1139 Last data filed at 04/14/2019 0514 Gross per 24 hour  Intake 1123.39 ml  Output 2450 ml  Net -1326.61 ml   Filed Weights   04/02/19 0500 04/07/19 0600 04/09/19 0401  Weight: 84.7 kg 86.2 kg 86.2 kg    Examination: General exam: Appears agitated and confused Respiratory system: Clear to auscultation. Respiratory effort normal. Cardiovascular system: S1 & S2 heard, RRR. No pedal edema. Gastrointestinal system: Abdomen is nondistended, soft and nontender.  Cortrak in place Central nervous system: Alert  Extremities: Symmetric in appearance bilaterally  Skin: No rashes, lesions or ulcers on exposed skin     Data Reviewed: I have personally reviewed following labs and imaging studies  CBC: Recent Labs  Lab 04/08/19 0627  WBC 8.1  NEUTROABS 5.8  HGB 11.8*  HCT 37.6*  MCV 101.9*  PLT 053   Basic Metabolic Panel: Recent Labs  Lab 04/10/19 0338 04/11/19 0317 04/12/19 0413 04/13/19 0434 04/14/19 0841  NA 148* 147* 146* 146* 144  K 3.3* 4.1 3.4* 3.6 3.3*  CL  114* 112* 112* 111 112*  CO2 '26 28 25 26 ' 21*  GLUCOSE 151* 122* 137* 134* 134*  BUN 37* 32* 33* 33* 27*  CREATININE 1.31* 1.25* 1.36* 1.28* 1.27*  CALCIUM 9.6 9.7 9.3 9.6 9.3  MG  --  2.4  --   --   --    GFR: Estimated Creatinine Clearance: 44.5 mL/min (A) (by C-G formula based on SCr of 1.27 mg/dL (H)). Liver Function Tests: No results for input(s): AST, ALT, ALKPHOS, BILITOT, PROT, ALBUMIN in the last 168 hours. No results for input(s): LIPASE, AMYLASE in the last 168 hours. No results for input(s): AMMONIA in the last 168 hours. Coagulation Profile: No results for input(s): INR, PROTIME in the last 168 hours. Cardiac Enzymes: No results for input(s): CKTOTAL, CKMB, CKMBINDEX, TROPONINI in the last 168 hours. BNP (last 3 results) No results for input(s): PROBNP in the last 8760 hours. HbA1C: No results for input(s): HGBA1C in the last 72 hours. CBG: Recent Labs  Lab  04/13/19 1624 04/13/19 1948 04/13/19 2342 04/14/19 0408 04/14/19 0746  GLUCAP 114* 132* 109* 111* 118*   Lipid Profile: No results for input(s): CHOL, HDL, LDLCALC, TRIG, CHOLHDL, LDLDIRECT in the last 72 hours. Thyroid Function Tests: No results for input(s): TSH, T4TOTAL, FREET4, T3FREE, THYROIDAB in the last 72 hours. Anemia Panel: No results for input(s): VITAMINB12, FOLATE, FERRITIN, TIBC, IRON, RETICCTPCT in the last 72 hours. Sepsis Labs: No results for input(s): PROCALCITON, LATICACIDVEN in the last 168 hours.  Recent Results (from the past 240 hour(s))  C difficile quick scan w PCR reflex     Status: None   Collection Time: 04/04/19  1:21 PM   Specimen: STOOL  Result Value Ref Range Status   C Diff antigen NEGATIVE NEGATIVE Final   C Diff toxin NEGATIVE NEGATIVE Final   C Diff interpretation No C. difficile detected.  Final    Comment: Performed at [facility], [place] [address] [place], [place] [address]      Radiology Studies: No results found.    Scheduled Meds: . amLODipine  5 mg Per Tube Daily  . aspirin  81 mg Per Tube Daily  . atorvastatin  40 mg Per Tube q1800  . chlorhexidine gluconate (MEDLINE KIT)  15 mL Mouth Rinse BID  . Chlorhexidine Gluconate Cloth  6 each Topical Daily  . donepezil  5 mg Per Tube QHS  . doxazosin  1 mg Per Tube Daily  . enoxaparin (LOVENOX) injection  40 mg Subcutaneous Q24H  . feeding supplement (PRO-STAT SUGAR FREE 64)  30 mL Per Tube TID  . folic acid  1 mg Per Tube Daily  . free water  200 mL Per Tube Q6H  . Gerhardt's butt cream   Topical BID  . insulin aspart  0-9 Units Subcutaneous Q4H  . mouth rinse  15 mL Mouth Rinse 10 times per day  . metoprolol tartrate  75 mg Per Tube BID  . pantoprazole sodium  40 mg Per Tube BID  . QUEtiapine  25 mg Oral QHS  . thiamine  100 mg Per Tube Daily  . ticagrelor  90 mg Oral BID   Or  . ticagrelor  90 mg Per Tube BID   Continuous Infusions: . sodium chloride    . sodium  chloride Stopped (04/01/19 1509)  . dextrose 50 mL/hr at 04/12/19 1037  . feeding supplement (JEVITY 1.5 CAL/FIBER) 1,000 mL (04/12/19 0739)     LOS: 23 days  Time spent: 25 minutes   Dessa Phi, DO [facility] 04/14/2019, 11:39 AM   Available via Epic secure chat 7am-7pm After these hours, please refer to coverage provider listed on [email or web address]

## 2019-04-14 NOTE — Progress Notes (Signed)
Tele called to report HR 47 unsustained. Pt assessed and asymptomatic. HR now 74. Provider notified.

## 2019-04-14 NOTE — Progress Notes (Signed)
Occupational Therapy Treatment Patient Details Name: Jonathan Berger MRN: [id number] DOB: May 27, 1931 Today's Date: 04/14/2019    History of present illness 86 year old presenting 2/7 with slurred speech and dysphagia found to have acute right middle cerebral artery stroke s/p tPA and EVR R MCA and ICA with stent on 04-07-2019.  Extubated 2/11, reintubated 2/15-2/17.   OT comments  Pt seen in conjunction with PT.  Pt awake, and eager to ambulate once he moved to the EOB.  Attempted to engage him in LB ADL tasks without success as he was unable to fully understand what was being asked of him (severity of HOH compounds issues).   He stood and ambulated in the room with min A +2 - initially he required physical assist of 2 people, but progressed to min A of 1 and second person for safety.   Family is very hopeful that they can take him home soon.  Recommend HHOT, and the below listed DME.    Follow Up Recommendations  Home health OT;Supervision/Assistance - 24 hour    Equipment Recommendations  Tub/shower bench;3 in 1 bedside commode;Wheelchair (measurements OT);Wheelchair cushion (measurements OT)    Recommendations for Other Services      Precautions / Restrictions Precautions Precautions: Fall Precaution Comments: Cortrak, restraints, mitts, posey       Mobility Bed Mobility Overal bed mobility: Needs Assistance Bed Mobility: Supine to Sit;Sit to Supine     Supine to sit: Mod assist;+2 for physical assistance;+2 for safety/equipment Sit to supine: Min assist;+2 for safety/equipment   General bed mobility comments: assist to initiate moving LEs off bed and to lift trunk.  Assist to guide LEs back onto bed   Transfers Overall transfer level: Needs assistance Equipment used: Rolling walker (2 wheeled) Transfers: Sit to/from [employer] Sit to Stand: Min assist;+2 physical assistance;+2 safety/equipment Stand pivot transfers: Min assist;+2 physical assistance;+2  safety/equipment       General transfer comment: assist for balance and safety and initially required assist to maneuver RW     Balance Overall balance assessment: Needs assistance Sitting-balance support: Feet supported Sitting balance-Leahy Scale: Poor Sitting balance - Comments: Pt able to maintain EOB sitting with close min guard assist for short periods of time, but is very restless and subsequently looses balance posteriorly and to the left  Postural control: Posterior lean;Left lateral lean Standing balance support: Bilateral upper extremity supported;During functional activity Standing balance-Leahy Scale: Poor Standing balance comment: reliant on UE support and min A                            ADL either performed or assessed with clinical judgement   ADL Overall ADL's : Needs assistance/impaired                         Toilet Transfer: Minimal assistance;+2 for physical assistance;+2 for safety/equipment;Ambulation;Comfort height toilet;Grab bars;RW Statistician Details (indicate cue type and reason): assist for balance and safety  Toileting- Clothing Manipulation and Hygiene: Maximal assistance;+2 for physical assistance;Sit to/from stand;Total assistance       Functional mobility during ADLs: Minimal assistance;+2 for physical assistance;+2 for safety/equipment;Cane;Cueing for safety;Cueing for sequencing       Vision       Perception     Praxis      Cognition Arousal/Alertness: Awake/alert Behavior During Therapy: Restless;Impulsive Overall Cognitive Status: Impaired/Different from baseline Area of Impairment: Orientation;Attention;Memory;Following commands;Safety/judgement;Awareness;Problem solving  Orientation Level: Disoriented to;Place;Time;Situation Current Attention Level: Focused;Sustained Memory: Decreased short-term memory Following Commands: Follows one step commands inconsistently;Follows one step  commands with increased time Safety/Judgement: Decreased awareness of safety;Decreased awareness of deficits   Problem Solving: Slow processing;Decreased initiation;Difficulty sequencing;Requires verbal cues;Requires tactile cues General Comments: Pt awake, and restless.  He will follow some basic commands with gestural cues.  He is very impulsive and requires cues for safety         Exercises     Shoulder Instructions       General Comments daughter present at beginning and end of session.  Updated her on performance and progress.  They are hopeful to be able to take pt home soon and report they will have at least 2 people in the home with him at all times     Pertinent Vitals/ Pain       Pain Assessment: Faces Faces Pain Scale: No hurt  Home Living                                          Prior Functioning/Environment              Frequency  Min 2X/week        Progress Toward Goals  OT Goals(current goals can now be found in the care plan section)  Progress towards OT goals: Progressing toward goals  Acute Rehab OT Goals Patient Stated Goal: pt unable to state.  Daugher and wife's goal is for pt to return home  OT Goal Formulation: With family Time For Goal Achievement: 04/28/19 Potential to Achieve Goals: Good ADL Goals Pt Will Perform Grooming: with mod assist;standing Pt Will Perform Lower Body Bathing: with mod assist;sit to/from stand Pt Will Perform Lower Body Dressing: with max assist;sit to/from stand Pt Will Transfer to Toilet: with min assist;ambulating;regular height toilet;grab bars Additional ADL Goal #1: Pt will complete bed mobility at min guard level in preparation for BADL Additional ADL Goal #2: Pt will follow 50% of one step commands to successfully complete BADL  Plan Discharge plan needs to be updated    Co-evaluation    PT/OT/SLP Co-Evaluation/Treatment: Yes Reason for Co-Treatment: Complexity of the patient's  impairments (multi-system involvement);Necessary to address cognition/behavior during functional activity;For patient/therapist safety;To address functional/ADL transfers   OT goals addressed during session: ADL's and self-care      AM-PAC OT "6 Clicks" Daily Activity     Outcome Measure   Help from another person eating meals?: A Lot Help from another person taking care of personal grooming?: A Lot Help from another person toileting, which includes using toliet, bedpan, or urinal?: A Lot Help from another person bathing (including washing, rinsing, drying)?: A Lot Help from another person to put on and taking off regular upper body clothing?: A Lot Help from another person to put on and taking off regular lower body clothing?: A Lot 6 Click Score: 12    End of Session Equipment Utilized During Treatment: Gait belt;Rolling walker  OT Visit Diagnosis: Unsteadiness on feet (R26.81);Cognitive communication deficit (R41.841) Symptoms and signs involving cognitive functions: Cerebral infarction Hemiplegia - caused by: Cerebral infarction   Activity Tolerance Patient tolerated treatment well;Patient limited by fatigue   Patient Left in bed;with call bell/phone within reach;with bed alarm set;with restraints reapplied;with family/visitor present   Nurse Communication Mobility status        Time: 9417-4081 OT Time  Calculation (min): 30 min  Charges: OT General Charges $OT Visit: 1 Visit OT Treatments $Neuromuscular Re-education: 8-22 mins  Eber Jones., OTR/L Acute Rehabilitation Services Pager [phone] Office [phone]    Jeani Hawking M 04/14/2019, 3:41 PM

## 2019-04-14 NOTE — Progress Notes (Addendum)
Modified Barium Swallow Progress Note  Patient Details  Name: Jonathan Berger MRN: [id number] Date of Birth: [birth year]  Today's Date: 04/14/2019  Modified Barium Swallow completed.  Full report located under Chart Review in the Imaging Section.  Brief recommendations include the following:  Clinical Impression  Pt's daughter, [name] present during MBS. Pt was calm, mostly cooperative and responded to encouragement from daughter to continue accepting barium trials. Oral phase marked by lingual rocking, decreased cohesion, delayed propulsion, piecemeal swallowing with anterior spill and lingual residue. Laryngeal vestibule penetration iwth nectar which mostly exited vestibule during the swallow. Timing of laryngeal closure was delayed and thin barium fell into airway and was aspirated from vallecular and pyriform sinus residue with delayed cough. Reduced tongue base retraction led to mild-moderate vallecular and pyriform sinus residue increasing risk for aspiration. Pt's prognosis for swallow improvement is fair-poor due to his cognition and deconditioning. Discussed pt's chronic aspiration risk and likely malnutrition despite consumption of honey thick liquids, regular texture (although pt should consume softer textures- minced/ground/puree. Ordering regular will allow family more choices of softer items to chose off menu) with daughter [name]) who verbalizing understanding. Sit upright, crush pills and full supervision. ST will continue for education.   Swallow Evaluation Recommendations       SLP Diet Recommendations: Regular texture ordered (although pt should consume softer textures- minced/ground/puree. Ordering regular will allow family more choices of softer items to chose off menu.. HONEY  thick liquids     Liquid administration: Teaspoon   Medication Administration: Crushed with puree   Supervision: Full assist for feeding;Full supervision/cueing for compensatory strategies   Compensations: Minimize environmental distractions;Slow rate;Small sips/bites;Lingual sweep for clearance of pocketing;Monitor for anterior loss;Clear throat after each swallow   Postural Changes: Seated upright at 90 degrees   Oral Care Recommendations: Oral care BID   Other Recommendations: Order thickener from pharmacy    Royce Macadamia 04/14/2019,9:50 PM

## 2019-04-14 NOTE — Progress Notes (Addendum)
Patient suffers from stroke which impairs their ability to perform daily activities like bathing,dressing, feeding, grooming in the home. A walker will not resolve  issue with performing activities of daily living. A wheelchair will allow patient to safely perform daily activities. Patient is not able to propel themselves in the home using a standard weight wheelchair due to weakness. Patient can self propel in the lightweight wheelchair. Length of need lifetime.  Accessories: elevating leg rests (ELRs), wheel locks, extensions and anti-tippers.

## 2019-04-14 NOTE — Progress Notes (Signed)
Patient ID: Jonathan Berger, male   DOB: 1931-05-21, 86 y.o.   MRN: [id number]  This NP visited patient at the bedside as a follow up for palliative medicine needs, and emotional support.    Patient passed  swallow study today with speech recommendations for diet.  Patient's wife, daughter/[name] and daughter/[name]  at the bedside.  Continued conversation with unit director/Denise and family regarding treatment plan.  [name] and [place] verbalized their frustration and belief that "staff is not listening to [place] and they do not understand my father".  [name] speaks to her belief that the patient is aware of his situation however he is annoyed and agitated by strangers, he that he just wants to go home.  [name] tells me that through the weekend she was here at bedside  with her dad and he was able to be calm, follow commands and appreciate his environment.  Daughter [name] continues to  verbalize her frustrations with "not being heard" and voicing specific "needs" that she needs for her father to be successful at plan for discharge home as soon as possible.  Unit director is making adjustments to treatment plan in hopes of meeting family expectations.   Plan for today -DNR/DNI -Family member has been approved to be at the bedside 24/7 -Speech to make recommendation for diet and offer education to family -Discussed with Dr. Alvino Chapel family is hope to sort out patient's urinary retention issues, Flomax added reevaluate in the morning.  Patient may need voiding trial prior to discharge and likely outpatient urology consult  -Ativan has been discontinued except for a one-time dose at bedtime -Family requests support from physical therapy to educate them in body mechanics and transfers for when the patient gets home - PMT continue to support holistically, this NP will follow up in the morning -Jess/ LCSW also present and stays to talk with family about home health services and disposition  plan.   Questions and concerns addressed   Discussed with Dr  Alvino Chapel   Total time spent on the unit was 60  minutes  Greater than 50% of the time was spent in counseling and coordination of care  [name] NP  Palliative Medicine Team Team Phone # [phone] Pager [phone]

## 2019-04-14 NOTE — TOC Progression Note (Signed)
Transition of Care [facility]) - Progression Note    Patient Details  Name: Jonathan Berger MRN: [id number] Date of Birth: [birth year]  Transition of Care [facility]) CM/SW Contact  Terrilee Croak, Student-Social Work Phone Number: 04/14/2019, 3:57 PM  Clinical Narrative:     MSW Intern was in the meeting with nursing director and palliative care to discuss goals of care. Pt's family was very unhappy and stated they would like to take their father home with [facility]. They have 24 hour supervision, and have a room on the first floor. Pt's daughter [name] has been making medical decisions and would like to be first contact. SW was advised that they will need DME and would like a list of [facility] agency options. She also had some safety concerns with her father ambulating or getting up independently. Options were discussed to ensure safety.  DME was ordered and Pt's daughter chose [facility]. They were contacted as well. SW will continue to follow for discharge planning.  Expected Discharge Plan: Home w Hospice Care Barriers to Discharge: Continued Medical Work up  Expected Discharge Plan and Services Expected Discharge Plan: Home w Hospice Care     Post Acute Care Choice: Hospice Living arrangements for the past 2 months: Single Family Home                                       Social Determinants of Health (SDOH) Interventions    Readmission Risk Interventions No flowsheet data found.

## 2019-04-14 NOTE — Progress Notes (Signed)
Physical Therapy Treatment Patient Details Name: Jonathan Berger MRN: [id number] DOB: Apr 28, 1931 Today's Date: 04/14/2019    History of Present Illness 86 year old presenting 2/7 with slurred speech and dysphagia found to have acute right middle cerebral artery stroke s/p tPA and EVR R MCA and ICA with stent on 04/06/2019.  Extubated 2/11, reintubated 2/15-2/17.    PT Comments    Pt with much functional improvement today from yesterday due to increased alertness. Pt still restless but able to follow simple commands. Mod A +2 for bed mobility. Sit<>stand and ambulation with min A +2 and RW. Pt ambulated 10', took seated rest break and then 10' again. Was very fatigued after session. Discussed equipment and supervision needs for home with daughter, Marlowe Kays. PT will continue to follow.    Follow Up Recommendations  Supervision/Assistance - 24 hour;Home health PT(pt still at SNF level but family wants to take him home)     Equipment Recommendations  Wheelchair (measurements PT);Rolling walker with 5" wheels;3in1 (PT)    Recommendations for Other Services       Precautions / Restrictions Precautions Precautions: Fall Precaution Comments: Cortrak, restraints, mitts, posey Restrictions Weight Bearing Restrictions: No    Mobility  Bed Mobility Overal bed mobility: Needs Assistance Bed Mobility: Supine to Sit;Sit to Supine     Supine to sit: Mod assist;+2 for physical assistance;+2 for safety/equipment Sit to supine: Min assist;+2 for safety/equipment   General bed mobility comments: assist to initiate moving LEs off bed and to lift trunk.  Assist to guide LEs back onto bed   Transfers Overall transfer level: Needs assistance Equipment used: Rolling walker (2 wheeled) Transfers: Sit to/from Stand Sit to Stand: Min assist;+2 physical assistance;+2 safety/equipment Stand pivot transfers: Min assist;+2 physical assistance;+2 safety/equipment       General transfer comment: assist for  balance and safety and tactile cues for hand placement  Ambulation/Gait Ambulation/Gait assistance: Min assist;+2 safety/equipment Gait Distance (Feet): 10 Feet(2x) Assistive device: Rolling walker (2 wheeled) Gait Pattern/deviations: Decreased stride length;Step-through pattern;Trunk flexed Gait velocity: decr Gait velocity interpretation: <1.31 ft/sec, indicative of household ambulator General Gait Details: pt ambulated to chair 10' away and then back to bed after seated rest break. Min A +2 for safety, mod A during turning   Stairs             Wheelchair Mobility    Modified Rankin (Stroke Patients Only) Modified Rankin (Stroke Patients Only) Pre-Morbid Rankin Score: No symptoms Modified Rankin: Moderately severe disability     Balance Overall balance assessment: Needs assistance Sitting-balance support: Feet supported Sitting balance-Leahy Scale: Poor Sitting balance - Comments: Pt able to maintain EOB sitting with close min guard assist for short periods of time, but is very restless and subsequently looses balance posteriorly and to the left  Postural control: Posterior lean;Left lateral lean Standing balance support: Bilateral upper extremity supported;During functional activity Standing balance-Leahy Scale: Poor Standing balance comment: reliant on UE support and min A                             Cognition Arousal/Alertness: Awake/alert Behavior During Therapy: Restless;Impulsive Overall Cognitive Status: Impaired/Different from baseline Area of Impairment: Orientation;Attention;Memory;Following commands;Safety/judgement;Awareness;Problem solving                 Orientation Level: Disoriented to;Place;Time;Situation Current Attention Level: Focused;Sustained Memory: Decreased short-term memory Following Commands: Follows one step commands inconsistently;Follows one step commands with increased time Safety/Judgement: Decreased awareness of  safety;Decreased  awareness of deficits Awareness: Intellectual Problem Solving: Slow processing;Decreased initiation;Difficulty sequencing;Requires verbal cues;Requires tactile cues General Comments: Pt awake, and restless.  He will follow some basic commands with gestural cues.  He is very impulsive and requires cues for safety       Exercises      General Comments General comments (skin integrity, edema, etc.): discussed home set up with daughter, equipment needs, and the need for 2 people for safety at this point      Pertinent Vitals/Pain Pain Assessment: Faces Faces Pain Scale: No hurt    Home Living                      Prior Function            PT Goals (current goals can now be found in the care plan section) Acute Rehab PT Goals Patient Stated Goal: pt unable to state.  Daugher and wife's goal is for pt to return home  PT Goal Formulation: With family Time For Goal Achievement: 04/24/19 Potential to Achieve Goals: Good Progress towards PT goals: Progressing toward goals    Frequency    Min 4X/week      PT Plan Discharge plan needs to be updated    Co-evaluation PT/OT/SLP Co-Evaluation/Treatment: Yes Reason for Co-Treatment: Complexity of the patient's impairments (multi-system involvement);For patient/therapist safety;Necessary to address cognition/behavior during functional activity PT goals addressed during session: Mobility/safety with mobility;Balance;Proper use of DME;Strengthening/ROM OT goals addressed during session: ADL's and self-care      AM-PAC PT "6 Clicks" Mobility   Outcome Measure  Help needed turning from your back to your side while in a flat bed without using bedrails?: A Little Help needed moving from lying on your back to sitting on the side of a flat bed without using bedrails?: A Lot Help needed moving to and from a bed to a chair (including a wheelchair)?: A Lot Help needed standing up from a chair using your arms (e.g.,  wheelchair or bedside chair)?: A Lot Help needed to walk in hospital room?: A Lot Help needed climbing 3-5 steps with a railing? : Total 6 Click Score: 12    End of Session Equipment Utilized During Treatment: Gait belt(posey used as gait belt) Activity Tolerance: Patient tolerated treatment well Patient left: in bed;with call bell/phone within reach;with family/visitor present;with restraints reapplied Nurse Communication: Mobility status PT Visit Diagnosis: Other abnormalities of gait and mobility (R26.89);Muscle weakness (generalized) (M62.81);Other symptoms and signs involving the nervous system (R29.898);Unsteadiness on feet (R26.81);Difficulty in walking, not elsewhere classified (R26.2)     Time: 5361-4431 PT Time Calculation (min) (ACUTE ONLY): 31 min  Charges:  $Gait Training: 8-22 mins                     Lyanne Co, PT  Acute Rehab Services  Pager [phone] Office [phone]    Lawana Chambers Nijah Tejera 04/14/2019, 5:09 PM

## 2019-04-15 LAB — GLUCOSE, CAPILLARY
Glucose-Capillary: 121 mg/dL — ABNORMAL HIGH (ref 70–99)
Glucose-Capillary: 123 mg/dL — ABNORMAL HIGH (ref 70–99)
Glucose-Capillary: 128 mg/dL — ABNORMAL HIGH (ref 70–99)
Glucose-Capillary: 139 mg/dL — ABNORMAL HIGH (ref 70–99)
Glucose-Capillary: 161 mg/dL — ABNORMAL HIGH (ref 70–99)
Glucose-Capillary: 94 mg/dL (ref 70–99)
Glucose-Capillary: 97 mg/dL (ref 70–99)

## 2019-04-15 LAB — BASIC METABOLIC PANEL
Anion gap: 10 (ref 5–15)
BUN: 25 mg/dL — ABNORMAL HIGH (ref 8–23)
CO2: 21 mmol/L — ABNORMAL LOW (ref 22–32)
Calcium: 9.6 mg/dL (ref 8.9–10.3)
Chloride: 113 mmol/L — ABNORMAL HIGH (ref 98–111)
Creatinine, Ser: 1.34 mg/dL — ABNORMAL HIGH (ref 0.61–1.24)
GFR calc Af Amer: 55 mL/min — ABNORMAL LOW (ref >60)
GFR calc non Af Amer: 48 mL/min — ABNORMAL LOW (ref >60)
Glucose, Bld: 135 mg/dL — ABNORMAL HIGH (ref 70–99)
Potassium: 3.2 mmol/L — ABNORMAL LOW (ref 3.5–5.1)
Sodium: 144 mmol/L (ref 135–145)

## 2019-04-15 LAB — CBC
HCT: 35 % — ABNORMAL LOW (ref 39.0–52.0)
Hemoglobin: 11.2 g/dL — ABNORMAL LOW (ref 13.0–17.0)
MCH: 31.9 pg (ref 26.0–34.0)
MCHC: 32 g/dL (ref 30.0–36.0)
MCV: 99.7 fL (ref 80.0–100.0)
Platelets: 228 10*3/uL (ref 150–400)
RBC: 3.51 MIL/uL — ABNORMAL LOW (ref 4.22–5.81)
RDW: 14.4 % (ref 11.5–15.5)
WBC: 8.3 10*3/uL (ref 4.0–10.5)
nRBC: 0 % (ref 0.0–0.2)

## 2019-04-15 LAB — MAGNESIUM: Magnesium: 2 mg/dL (ref 1.7–2.4)

## 2019-04-15 MED ORDER — NON FORMULARY: 6.0000 mg | Freq: Every evening | Status: DC | PRN

## 2019-04-15 MED ORDER — POTASSIUM CHLORIDE 20 MEQ/15ML (10%) PO SOLN
40.0000 meq | Freq: Two times a day (BID) | ORAL | Status: AC
  Administered 2019-04-15 (×2): 40 meq via ORAL
  Filled 2019-04-15 (×2): qty 30

## 2019-04-15 MED ORDER — MELATONIN 3 MG PO TABS
6.0000 mg | ORAL_TABLET | Freq: Every evening | ORAL | Status: DC | PRN
  Administered 2019-04-15: 6 mg via ORAL
  Filled 2019-04-15 (×2): qty 2

## 2019-04-15 NOTE — Progress Notes (Signed)
Upon q2 restraint assessment pt left arm found to be swollen at forearm IV site infusing potassium. Infusion stopped and warm compress applied. Provider notified. New IV site obtained on right forearm and infusion restarted.

## 2019-04-15 NOTE — Progress Notes (Signed)
Nutrition Follow-up  **RD working remotely**  DOCUMENTATION CODES:   Not applicable  INTERVENTION:  Please obtain updated weight.   Magic cup TID with meals, each supplement provides 290 kcal and 9 grams of protein  RD would not recommend removing Cortrak until PO intake is adequate to meet needs.  NUTRITION DIAGNOSIS:   Inadequate oral intake related to dysphagia as evidenced by meal completion < 50%.  GOAL:   Patient will meet greater than or equal to 90% of their needs  Not met, tube feeding discontinued.   MONITOR:   PO intake, Supplement acceptance, Weight trends, Labs, Diet advancement  REASON FOR ASSESSMENT:   Consult Enteral/tube feeding initiation and management  ASSESSMENT:   86 year old male with past medical history of HTN , anemia, and chronic back pain presented to ED with slurred speech and left facial droop. CTA showed right ICA near occulusion with distal M1 thrombus extending into M2 branch.  2/7 IR - R MCA embolization and revascularization 2/8 TEE - Left EF ~45% 2/9 CXR - new RLL infiltrate vs atelectasis 2/11ptextubated 2/12 Cortrak - tube just inside pylorus 2/14 RRT called d/t increased respirations, decreased mental status 2/15 pt re-intubated for respiratory insufficiency  2/17 pt extubated 3/1 tube feeding stopped 3/2 MBS, regular diet with honey thickened liquids   Discussed pt with RN and SLP.   Pt's tube feedings were stopped on 3/1 due to concerns of aspiration with plans to have MBS on 3/2. After MBS, SLP recommended pt follow a DYS1/Honey thick liquids diet but placed the order as regular diet to allow more options for the patient. Based on RD's conversation with pt's daughter, SLP discussed what should and should not be ordered from the menu with the family.   RD and pt's daughter spoke at length regarding RD's concerns for pt not having adequate PO intake to meet his needs. Pt's daughter states pt is frustrated and "will only  eat what he likes," listing mashed potatoes, sodas, and Ensure. RD discussed limitations with honey thick supplements and need to encourage pt to eat, particularly with protein foods.   RD will order Magic Cup and will continue to monitor for adequacy of PO intake. RD would not recommend removing Cortrak tube until either pt's PO intake is adequate to meet his needs or until further [place] discussions regarding nutrition support.   Medications reviewed and include: Pro-stat TID (via tube), Folvite, SSI, thiamine  Labs reviewed: K+ 3.3 (L) CBGs 94-121  Diet Order:   Diet Order            Diet regular Room service appropriate? Yes; Fluid consistency: Honey Thick  Diet effective now              EDUCATION NEEDS:   Not appropriate for education at this time  Skin:  Skin Assessment: Reviewed RN Assessment  Last BM:  04/14/19  Height:   Ht Readings from Last 1 Encounters:  04/02/2019 '5\' 11"'  (1.803 m)    Weight:   Wt Readings from Last 1 Encounters:  04/09/19 86.2 kg    BMI:  Body mass index is 26.5 kg/m.  Estimated Nutritional Needs:   Kcal:  1900-2100  Protein:  110-125  Fluid:  >/= 1.9 L/day   Larkin Ina, MS, RD, LDN RD pager number and weekend/on-call pager number located in [place].

## 2019-04-15 NOTE — Progress Notes (Signed)
Physical Therapy Treatment Patient Details Name: Jonathan Berger MRN: [id number] DOB: [birth year] Today's Date: 04/15/2019    History of Present Illness 86 year old presenting 2/7 with slurred speech and dysphagia found to have acute right middle cerebral artery stroke s/p tPA and EVR R MCA and ICA with stent on April 15, 2019.  Extubated 2/11, reintubated 2/15-2/17.    PT Comments    Pt making good progress towards his mobility goals this session. Ambulating 90 feet with a walker at a min assist level (+2 safety). Continues with poor cognition, balance impairments, weakness and decreased endurance. Rest of session focused on visual demonstration of walker and wheelchair management and parts with pt wife. Pt wife verbalized understanding. Will continue to follow acutely.     Follow Up Recommendations  Supervision/Assistance - 24 hour;Home health PT     Equipment Recommendations  Wheelchair (measurements PT);Rolling walker with 5" wheels;3in1 (PT)    Recommendations for Other Services       Precautions / Restrictions Precautions Precautions: Fall Precaution Comments: Cortrak, restraints Restrictions Weight Bearing Restrictions: No    Mobility  Bed Mobility Overal bed mobility: Needs Assistance Bed Mobility: Supine to Sit;Sit to Supine     Supine to sit: Mod assist Sit to supine: Min guard   General bed mobility comments: Pt requring modA for trunk elevation to upright; able to lie down without physical assist  Transfers Overall transfer level: Needs assistance Equipment used: Rolling walker (2 wheeled) Transfers: Sit to/from Stand Sit to Stand: Min assist;+2 physical assistance;+2 safety/equipment         General transfer comment: MinA to rise from edge of bed  Ambulation/Gait Ambulation/Gait assistance: Min assist;+2 safety/equipment Gait Distance (Feet): 90 Feet Assistive device: Rolling walker (2 wheeled) Gait Pattern/deviations: Decreased stride length;Step-through  pattern;Trunk flexed Gait velocity: decr   General Gait Details: Pt with poor proximity to walker despite cueing, increased trunk flexion as result. Decreased bilateral foot clearance   Stairs             Wheelchair Mobility    Modified Rankin (Stroke Patients Only) Modified Rankin (Stroke Patients Only) Pre-Morbid Rankin Score: No symptoms Modified Rankin: Moderately severe disability     Balance Overall balance assessment: Needs assistance Sitting-balance support: Feet supported Sitting balance-Leahy Scale: Fair     Standing balance support: Bilateral upper extremity supported;During functional activity Standing balance-Leahy Scale: Poor Standing balance comment: reliant on UE support and min A                             Cognition Arousal/Alertness: Awake/alert Behavior During Therapy: Restless;Impulsive Overall Cognitive Status: Impaired/Different from baseline Area of Impairment: Orientation;Attention;Memory;Following commands;Safety/judgement;Awareness;Problem solving                 Orientation Level: Disoriented to;Place;Time;Situation Current Attention Level: Focused;Sustained Memory: Decreased short-term memory Following Commands: Follows one step commands inconsistently;Follows one step commands with increased time Safety/Judgement: Decreased awareness of safety;Decreased awareness of deficits Awareness: Intellectual Problem Solving: Slow processing;Decreased initiation;Difficulty sequencing;Requires verbal cues;Requires tactile cues General Comments: Pt following 1 step commands, has decreased attention to task. Oriented to self only, stating he is in [place]. Often asking for his wife when she is not in his line of sight.      Exercises General Exercises - Lower Extremity Hip Flexion/Marching: 10 reps;Both;Seated    General Comments        Pertinent Vitals/Pain Pain Assessment: Faces Faces Pain Scale: No hurt    Home  Living                      Prior Function            PT Goals (current goals can now be found in the care plan section) Acute Rehab PT Goals Patient Stated Goal: pt wife goal is for pt to return home, walk, get Cortrak removed  PT Goal Formulation: With family Time For Goal Achievement: 04/24/19 Potential to Achieve Goals: Good Progress towards PT goals: Not progressing toward goals - comment    Frequency    Min 4X/week      PT Plan Current plan remains appropriate    Co-evaluation              AM-PAC PT "6 Clicks" Mobility   Outcome Measure  Help needed turning from your back to your side while in a flat bed without using bedrails?: A Little Help needed moving from lying on your back to sitting on the side of a flat bed without using bedrails?: A Lot Help needed moving to and from a bed to a chair (including a wheelchair)?: A Little Help needed standing up from a chair using your arms (e.g., wheelchair or bedside chair)?: A Little Help needed to walk in hospital room?: A Little Help needed climbing 3-5 steps with a railing? : A Lot 6 Click Score: 16    End of Session Equipment Utilized During Treatment: Gait belt Activity Tolerance: Patient tolerated treatment well Patient left: in bed;with call bell/phone within reach;with family/visitor present;with restraints reapplied Nurse Communication: Mobility status PT Visit Diagnosis: Other abnormalities of gait and mobility (R26.89);Muscle weakness (generalized) (M62.81);Other symptoms and signs involving the nervous system (R29.898);Unsteadiness on feet (R26.81);Difficulty in walking, not elsewhere classified (R26.2)     Time: 5732-2025 PT Time Calculation (min) (ACUTE ONLY): 33 min  Charges:  $Gait Training: 8-22 mins $Self Care/Home Management: 8-22                       Wyona Almas, PT, DPT Acute Rehabilitation Services Pager [phone] Office [phone]    Deno Etienne 04/15/2019,  3:58 PM

## 2019-04-15 NOTE — Progress Notes (Signed)
Patient ID: Jonathan Berger, male   DOB: [birth year], 86 y.o.   MRN: [id number]  This NP visited patient at the bedside as a follow up for palliative medicine needs, and emotional support.   Currently patient is resting quietly in bed and appears comfortable  Patient's daughter/Jonathan Berger  at the bedside.  Spoke to daughter/Jonathan Berger by telephone for continued conversation regarding current medical situation.    We discussed that although he has been cleared for po diet and is taking orals,  at this time he remains  high risk for inadequate oral intake and aspiration.     We will continue to monitor.  Jonathan Berger asks that nursing all "be on the same page" regarding commended diet.   Patient remains high risk for decompensation secondary to multiple comorbidities  Continued conversation with unit director/Jonathan Berger and family regarding treatment plan.  Plan for today -DNR/DNI -Family member has been approved to be at the bedside 24/7 -Continue to encourage oral intake with recommended diet by speech therapy -Dr. Alvino Chapel continues to address patient's urinary retention issues, Flomax added reevaluate in the morning.  Patient may need voiding trial prior to discharge and likely outpatient urology consult    Possibly patient could be discharged with Foley cath -Ativan has been discontinued except for a one-time dose at bedtime -- PMT continue to support holistically, this NP will follow up in the morning -Jonathan Berger/ LCSW working with family about home health services and disposition plan.   Questions and concerns addressed   Discussed with Dr  Alvino Chapel   Total time spent on the unit was 60  minutes  Greater than 50% of the time was spent in counseling and coordination of care  [name] NP  Palliative Medicine Team Team Phone # [phone] Pager [phone]

## 2019-04-15 NOTE — TOC Progression Note (Signed)
Transition of Care [facility]) - Progression Note    Patient Details  Name: Jonathan Berger MRN: [id number] Date of Birth: Apr 19, 1931  Transition of Care [facility]) CM/SW Contact  Baldemar Lenis, [place] Phone Number: 04/15/2019, 11:41 AM  Clinical Narrative:   CSW alerted by PMT NP that patient's family had questions about home health arrangements. CSW spoke with patient's daughter and wife to answer their questions, updated them that [facility] has been set up to provide home health services and they will contact them after they get home. Equipment has already been delivered to the room. Family asked about shower chair, and CSW explained how that's not covered through insurance and they could order that from [place] or [place] for the size of the shower that they had. No other concerns at this time. CSW to follow.    Expected Discharge Plan: Home w Home Health Services Barriers to Discharge: Continued Medical Work up  Expected Discharge Plan and Services Expected Discharge Plan: Home w Home Health Services     Post Acute Care Choice: Hospice Living arrangements for the past 2 months: Single Family Home                                       Social Determinants of Health (SDOH) Interventions    Readmission Risk Interventions No flowsheet data found.

## 2019-04-15 NOTE — Progress Notes (Signed)
PROGRESS NOTE    Jonathan Berger  ZOX:[id number] DOB: 1931/10/29 DOA: 03/30/2019 PCP: [name], MD     Brief Narrative:  Jonathan Berger is a 86 yr oldmale with history of hypertension, who was admitted by Dr. [name] on 04/08/2019 for dense right ischemic MCA CVA, s/p TPA and VIR intervention with balloon angioplasty and stent placement at proximal R ICA. Pt developed right lower lobe aspiration pneumonitis after extubation on 2/11 and was later re-intubated on 2/15 and extubated on 2/17 . Pt was also found to have new systolic CHF with EF 81%. Pt had been managed in the ICU and transferred to the floor, TRH assumed care on 04/03/19.  Due to dysphagia, patient remains on tube feed via cortrak.  Palliative care involved in goals of care conversations.  New events last 24 hours / Subjective: Daughter at bedside states that he started getting fidgety around 3am. Wondering when they can take him home, asking about getting cortrak and foley catheter out.   Assessment & Plan:   Principal Problem:   Middle cerebral artery embolism, right Active Problems:   Acute on chronic respiratory failure with hypoxia (HCC)   Systolic dysfunction   Dehydration   Aspiration pneumonia of right lower lobe due to gastric secretions (HCC)   Tachycardia   Hypoxia   Paroxysmal atrial fibrillation (HCC)   Multifocal atrial tachycardia (HCC)   Palliative care by specialist   Goals of care, counseling/discussion   DNR (do not resuscitate)   Muscular weakness   Dysphagia   Xerostomia   Delirium   Adult failure to thrive   Agitation   At high risk for aspiration   Acute hypoxemic respiratory failure likely secondary to recent CVA/aspiration PNA/acute CHF -Status post intubation x2, recently extubated on 04/01/2019 -Completed cefepime and vancomycin x 7 days on 04/05/19  Right MCA infarct s/p tPA and L ICA stent placement -MRI brain with right temporal lobe cortical/subcortical infarct with numerous  small scattered R brain infarcts -CTA head and neck showed right MCA LVO mid and distal segment. Right ICA critical stenosis, left ICA distal bulb string sign, severe B VA origin stenosis -Repeat CT head on 04/06/2019 unremarkable -Echo showed EF 19%, grade 1 diastolic dysfunction -Carotid Doppler showed left ICA 60 to 79% stenosis -LDL 80, A1c 5.9 -Continue Brilinta, aspirin, Lipitor -PT/OT/SLP -?Plan for further left ICA intervention in the future  Acute systolic and diastolic HF Multifocal atrial tachycardia (MAT) -Echo showed EF of 14%, grade 1 diastolic dysfunction -Cardiology following, plan to repeat echo as an outpatient and consider ischemic evaluation if EF remains depressed -Continue metoprolol   Acute urinary retention -S/p foley catheter placement. Patient pulled out 3/1  -Replaced Foley catheter due to urinary retention. Started proscar. Hopeful we can complete voiding trial prior to discharge to avoid discharge with foley  Hypertension -Continue amlodipine. Valsartan-hydrochlorothiazide on hold  Dysphagia -S/p MBS and now advanced to dysphagia diet -I do not feel that we can remove cortrak just yet, would like to see how he does with dysphagia diet. Dietitian also recommending not removing cortrak until PO intake is adequate to meet needs, although I'm not sure that he will be able to ever meet his nutrition needs via solely PO   Dementia -Currently with delirium, likely ICU/hospital delirium -Continue Seroquel, continue Aricept, restraints PRN   GOC -Patient with very poor prognosis -Palliative following   Hypernatremia -Resolved. Stop IVF.   Hypokalemia -Replace, trend    DVT prophylaxis: Lovenox  Code Status: DNR  Family Communication: Daughter at bedside Disposition Plan:  . Patient is from home prior to admission. Marland Kitchen Barrier(s) to discharge include cortrak in place and inadequate PO intake. His diet was advanced by SLP yesterday to dysphagia  diet. Would like to see how he does on this and remove cortrak prior to discharge, with the family's understanding that he may not 100% be able to meet his nutrition needs solely via PO intake. But their main goal is for patient to return home with home health (family not ready for hospice). Also will need to address foley prior to discharge. I started proscar yesterday (unable to give flomax in pudding/cortrak). Hopefully this will help and we can do a voiding trial prior to discharge home.    Consultants:   Neurology  Critical care  Cardiology  Palliative care   Antimicrobials:  Anti-infectives (From admission, onward)   Start     Dose/Rate Route Frequency Ordered Stop   03/31/19 1100  vancomycin (VANCOCIN) IVPB 1000 mg/200 mL premix     1,000 mg 200 mL/hr over 60 Minutes Intravenous Every 24 hours 03/31/19 1027 04/04/19 1356   03/30/19 1000  ceFEPIme (MAXIPIME) 2 g in sodium chloride 0.9 % 100 mL IVPB     2 g 200 mL/hr over 30 Minutes Intravenous Every 12 hours 03/30/19 0806 04/04/19 2212   03/29/19 1230  vancomycin (VANCOREADY) IVPB 1750 mg/350 mL  Status:  Discontinued     1,750 mg 175 mL/hr over 120 Minutes Intravenous Every 48 hours 03/29/19 1141 03/31/19 1027   03/29/19 1200  ceFEPIme (MAXIPIME) 2 g in sodium chloride 0.9 % 100 mL IVPB  Status:  Discontinued     2 g 200 mL/hr over 30 Minutes Intravenous Every 24 hours 03/29/19 1129 03/30/19 0806   03/29/19 1130  vancomycin (VANCOCIN) IVPB 1000 mg/200 mL premix  Status:  Discontinued     1,000 mg 200 mL/hr over 60 Minutes Intravenous  Once 03/29/19 1129 03/29/19 1140   03/24/19 0930  ampicillin-sulbactam (UNASYN) 1.5 g in sodium chloride 0.9 % 100 mL IVPB  Status:  Discontinued     1.5 g 200 mL/hr over 30 Minutes Intravenous Every 8 hours 03/24/19 0852 03/29/19 1129   04/09/2019 1453  ceFAZolin (ANCEF) 2-4 GM/100ML-% IVPB    Note to Pharmacy: Novella Rob   : cabinet override      04/04/2019 1453 03/31/2019 1833        Objective: Vitals:   04/14/19 2050 04/15/19 0013 04/15/19 0316 04/15/19 0741  BP: 125/77 118/84 122/69 (!) 130/52  Pulse: 83 90 88 89  Resp: '20 18 18 18  ' Temp: 98.6 F (37 C) 98.2 F (36.8 C) (!) 97.4 F (36.3 C)   TempSrc: Oral  Oral   SpO2: 97% 96% 99% 99%  Weight:      Height:        Intake/Output Summary (Last 24 hours) at 04/15/2019 1035 Last data filed at 04/15/2019 0429 Gross per 24 hour  Intake 580 ml  Output 550 ml  Net 30 ml   Filed Weights   04/02/19 0500 04/07/19 0600 04/09/19 0401  Weight: 84.7 kg 86.2 kg 86.2 kg    Examination: General exam: Appears confused, restraints in place  Respiratory system: Clear to auscultation. Respiratory effort normal. Cardiovascular system: S1 & S2 heard, RRR. No pedal edema. Gastrointestinal system: Abdomen is nondistended, soft and nontender. Normal bowel sounds heard. Central nervous system: Alert  Extremities: Symmetric in appearance bilaterally  Skin: No rashes, lesions or ulcers on exposed  skin     Data Reviewed: I have personally reviewed following labs and imaging studies  CBC: Recent Labs  Lab 04/15/19 0716  WBC 8.3  HGB 11.2*  HCT 35.0*  MCV 99.7  PLT 220   Basic Metabolic Panel: Recent Labs  Lab 04/11/19 0317 04/12/19 0413 04/13/19 0434 04/14/19 0841 04/15/19 0716  NA 147* 146* 146* 144 144  K 4.1 3.4* 3.6 3.3* 3.2*  CL 112* 112* 111 112* 113*  CO2 '28 25 26 ' 21* 21*  GLUCOSE 122* 137* 134* 134* 135*  BUN 32* 33* 33* 27* 25*  CREATININE 1.25* 1.36* 1.28* 1.27* 1.34*  CALCIUM 9.7 9.3 9.6 9.3 9.6  MG 2.4  --   --   --  2.0   GFR: Estimated Creatinine Clearance: 42.1 mL/min (A) (by C-G formula based on SCr of 1.34 mg/dL (H)). Liver Function Tests: No results for input(s): AST, ALT, ALKPHOS, BILITOT, PROT, ALBUMIN in the last 168 hours. No results for input(s): LIPASE, AMYLASE in the last 168 hours. No results for input(s): AMMONIA in the last 168 hours. Coagulation Profile: No results  for input(s): INR, PROTIME in the last 168 hours. Cardiac Enzymes: No results for input(s): CKTOTAL, CKMB, CKMBINDEX, TROPONINI in the last 168 hours. BNP (last 3 results) No results for input(s): PROBNP in the last 8760 hours. HbA1C: No results for input(s): HGBA1C in the last 72 hours. CBG: Recent Labs  Lab 04/14/19 1644 04/14/19 2050 04/15/19 0009 04/15/19 0314 04/15/19 0756  GLUCAP 104* 127* 121* 94 128*   Lipid Profile: No results for input(s): CHOL, HDL, LDLCALC, TRIG, CHOLHDL, LDLDIRECT in the last 72 hours. Thyroid Function Tests: No results for input(s): TSH, T4TOTAL, FREET4, T3FREE, THYROIDAB in the last 72 hours. Anemia Panel: No results for input(s): VITAMINB12, FOLATE, FERRITIN, TIBC, IRON, RETICCTPCT in the last 72 hours. Sepsis Labs: No results for input(s): PROCALCITON, LATICACIDVEN in the last 168 hours.  No results found for this or any previous visit (from the past 240 hour(s)).    Radiology Studies: DG Swallowing Func-Speech Pathology  Result Date: 04/14/2019 Objective Swallowing Evaluation: Type of Study: MBS-Modified Barium Swallow Study  Patient Details Name: ARMANDO BUKHARI MRN: [id number] Date of Birth: 1931-08-10 Today's Date: 04/14/2019 Time: SLP Start Time (ACUTE ONLY): 1318 -SLP Stop Time (ACUTE ONLY): 1342 SLP Time Calculation (min) (ACUTE ONLY): 24 min Past Medical History: Past Medical History: Diagnosis Date . Anemia  . Chronic back pain   herniated disc . Hepatitis   50+yrs ago . Hypertension   takes Amlodipine/Valsartan/HCTZ daily . Personal history of kidney stones  Past Surgical History: Past Surgical History: Procedure Laterality Date . BACK SURGERY  1977/006 . COLONOSCOPY   . CYSTOSCOPY   . ESOPHAGOGASTRODUODENOSCOPY   . IR ANGIO INTRA EXTRACRAN SEL COM CAROTID INNOMINATE UNI L MOD SED  03/31/2019 . IR ANGIO VERTEBRAL SEL SUBCLAVIAN INNOMINATE UNI L MOD SED  04/07/2019 . IR CT HEAD LTD  03/16/2019 . IR ENDOVASC INTRACRANIAL INF OTHER THAN THROMBO ART INC  DIAG ANGIO  03/23/2019 . IR INTRAVSC STENT CERV CAROTID W/O EMB-PROT MOD SED INC ANGIO  03/26/2019 . LITHOTRIPSY   . LUMBAR LAMINECTOMY  01/29/2011  Procedure: MICRODISCECTOMY LUMBAR LAMINECTOMY;  Surgeon: Marybelle Killings;  Location: [place];  Service: Orthopedics;  Laterality: Right;  Right L4-5 Microdiscectomy . RADIOLOGY WITH ANESTHESIA N/A 04/03/2019  Procedure: RADIOLOGY WITH ANESTHESIA;  Surgeon: Luanne Bras, MD;  Location: [place];  Service: Radiology;  Laterality: N/A; HPI: Pt is an 86 year old male presenting 2/7 with slurred  speech and dysphagia, found to have acute R MCA stroke s/p tPA and EVR R MCA and ICA with stent. ETT 2/7-2/11. PMH: HTN. Intubated 2/7-2/11.  Subjective: alert, confused Assessment / Plan / Recommendation CHL IP CLINICAL IMPRESSIONS 04/14/2019 Clinical Impression Pt's daughter, Marlowe Kays present during [place]. Pt was calm, mostly cooperative and responded to encouragement from daughter to continue accepting barium trials. Oral phase marked by lingual rocking, decreased cohesion, delayed propulsion, piecemeal swallowing with anterior spill and lingual residue. Laryngeal vestibule penetration iwth nectar which mostly exited vestibule during the swallow. Timing of laryngeal closure was delayed and thin barium fell into airway and was aspirated from vallecular and pyriform sinus residue with delayed cough. Reduced tongue base retraction led to mild-moderate vallecular and pyriform sinus residue increasing risk for aspiration. Pt's prognosis for swallow improvement is fair-poor due to his cognition and deconditioning. Discussed pt's chronic aspiration risk and likely malnutrition despite consumption of honey thick liquids, Dys 1 (puree) with daughter Marlowe Kays) who verbalizing understanding. Sit upright, crush pills and full supervision. ST will continue for education. SLP Visit Diagnosis -- Attention and concentration deficit following -- Frontal lobe and executive function deficit following -- Impact on  safety and function --   CHL IP TREATMENT RECOMMENDATION 04/14/2019 Treatment Recommendations Therapy as outlined in treatment plan below   Prognosis 04/14/2019 Prognosis for Safe Diet Advancement Fair Barriers to Reach Goals Cognitive deficits;Behavior Barriers/Prognosis Comment -- CHL IP DIET RECOMMENDATION 04/14/2019 SLP Diet Recommendations Dysphagia 1 (Puree) solids;Honey thick liquids Liquid Administration via Cup Medication Administration Crushed with puree Compensations Minimize environmental distractions;Slow rate;Small sips/bites;Lingual sweep for clearance of pocketing;Monitor for anterior loss;Clear throat after each swallow Postural Changes Seated upright at 90 degrees   CHL IP OTHER RECOMMENDATIONS 04/14/2019 Recommended Consults -- Oral Care Recommendations Oral care BID Other Recommendations Order thickener from pharmacy   CHL IP FOLLOW UP RECOMMENDATIONS 04/14/2019 Follow up Recommendations Home health SLP   CHL IP FREQUENCY AND DURATION 04/14/2019 Speech Therapy Frequency (ACUTE ONLY) min 2x/week Treatment Duration 2 weeks      CHL IP ORAL PHASE 04/14/2019 Oral Phase Impaired Oral - Pudding Teaspoon -- Oral - Pudding Cup -- Oral - Honey Teaspoon -- Oral - Honey Cup Lingual pumping;Reduced posterior propulsion Oral - Nectar Teaspoon -- Oral - Nectar Cup Lingual pumping;Reduced posterior propulsion Oral - Nectar Straw -- Oral - Thin Teaspoon -- Oral - Thin Cup Lingual pumping;Reduced posterior propulsion;Left anterior bolus loss;Right anterior bolus loss Oral - Thin Straw -- Oral - Puree Lingual pumping;Reduced posterior propulsion Oral - Mech Soft Lingual/palatal residue Oral - Regular -- Oral - Multi-Consistency -- Oral - Pill -- Oral Phase - Comment --  CHL IP PHARYNGEAL PHASE 04/14/2019 Pharyngeal Phase Impaired Pharyngeal- Pudding Teaspoon -- Pharyngeal -- Pharyngeal- Pudding Cup -- Pharyngeal -- Pharyngeal- Honey Teaspoon -- Pharyngeal -- Pharyngeal- Honey Cup Pharyngeal residue - pyriform;Pharyngeal residue -  valleculae Pharyngeal -- Pharyngeal- Nectar Teaspoon -- Pharyngeal -- Pharyngeal- Nectar Cup Penetration/Aspiration during swallow Pharyngeal -- Pharyngeal- Nectar Straw -- Pharyngeal -- Pharyngeal- Thin Teaspoon -- Pharyngeal -- Pharyngeal- Thin Cup Penetration/Aspiration before swallow;Penetration/Aspiration during swallow;Pharyngeal residue - valleculae Pharyngeal -- Pharyngeal- Thin Straw -- Pharyngeal -- Pharyngeal- Puree Pharyngeal residue - pyriform Pharyngeal -- Pharyngeal- Mechanical Soft -- Pharyngeal -- Pharyngeal- Regular -- Pharyngeal -- Pharyngeal- Multi-consistency -- Pharyngeal -- Pharyngeal- Pill -- Pharyngeal -- Pharyngeal Comment --  CHL IP CERVICAL ESOPHAGEAL PHASE 04/14/2019 Cervical Esophageal Phase WFL Pudding Teaspoon -- Pudding Cup -- Honey Teaspoon -- Honey Cup -- Nectar Teaspoon -- Nectar Cup -- [employer]  Straw -- Thin Teaspoon -- Thin Cup -- Thin Straw -- Puree -- Mechanical Soft -- Regular -- Multi-consistency -- Pill -- Cervical Esophageal Comment -- Houston Siren 04/14/2019, 9:49 PM Orbie Pyo Colvin Caroli.Ed Actor Pager [phone] Office [phone]                 Scheduled Meds: . amLODipine  5 mg Per Tube Daily  . aspirin  81 mg Per Tube Daily  . atorvastatin  40 mg Per Tube q1800  . chlorhexidine gluconate (MEDLINE KIT)  15 mL Mouth Rinse BID  . Chlorhexidine Gluconate Cloth  6 each Topical Daily  . donepezil  5 mg Per Tube QHS  . enoxaparin (LOVENOX) injection  40 mg Subcutaneous Q24H  . feeding supplement (PRO-STAT SUGAR FREE 64)  30 mL Per Tube TID  . finasteride  5 mg Oral Daily  . folic acid  1 mg Per Tube Daily  . free water  200 mL Per Tube Q6H  . Gerhardt's butt cream   Topical BID  . insulin aspart  0-9 Units Subcutaneous Q4H  . LORazepam  1 mg Intravenous QHS  . mouth rinse  15 mL Mouth Rinse 10 times per day  . metoprolol tartrate  75 mg Per Tube BID  . pantoprazole sodium  40 mg Per Tube BID  . potassium chloride  40 mEq  Oral BID  . QUEtiapine  25 mg Per Tube QHS  . thiamine  100 mg Per Tube Daily  . ticagrelor  90 mg Oral BID   Or  . ticagrelor  90 mg Per Tube BID   Continuous Infusions: . sodium chloride    . sodium chloride Stopped (04/01/19 1509)  . feeding supplement (JEVITY 1.5 CAL/FIBER) 1,000 mL (04/12/19 0739)     LOS: 24 days      Time spent: 25 minutes   Dessa Phi, DO [facility] 04/15/2019, 10:35 AM   Available via Epic secure chat 7am-7pm After these hours, please refer to coverage provider listed on [email or web address]

## 2019-04-16 ENCOUNTER — Inpatient Hospital Stay (HOSPITAL_COMMUNITY): Payer: Medicare Other

## 2019-04-16 LAB — URINALYSIS, ROUTINE W REFLEX MICROSCOPIC
Bilirubin Urine: NEGATIVE
Glucose, UA: NEGATIVE mg/dL
Ketones, ur: NEGATIVE mg/dL
Leukocytes,Ua: NEGATIVE
Nitrite: NEGATIVE
Protein, ur: 30 mg/dL — AB
RBC / HPF: >50 RBC/hpf — ABNORMAL HIGH (ref 0–5)
Specific Gravity, Urine: 1.02 (ref 1.005–1.030)
pH: 5 (ref 5.0–8.0)

## 2019-04-16 LAB — BASIC METABOLIC PANEL
Anion gap: 14 (ref 5–15)
BUN: 28 mg/dL — ABNORMAL HIGH (ref 8–23)
CO2: 18 mmol/L — ABNORMAL LOW (ref 22–32)
Calcium: 10.4 mg/dL — ABNORMAL HIGH (ref 8.9–10.3)
Chloride: 117 mmol/L — ABNORMAL HIGH (ref 98–111)
Creatinine, Ser: 1.56 mg/dL — ABNORMAL HIGH (ref 0.61–1.24)
GFR calc Af Amer: 46 mL/min — ABNORMAL LOW (ref >60)
GFR calc non Af Amer: 40 mL/min — ABNORMAL LOW (ref >60)
Glucose, Bld: 161 mg/dL — ABNORMAL HIGH (ref 70–99)
Potassium: 3.5 mmol/L (ref 3.5–5.1)
Sodium: 149 mmol/L — ABNORMAL HIGH (ref 135–145)

## 2019-04-16 LAB — GLUCOSE, CAPILLARY
Glucose-Capillary: 122 mg/dL — ABNORMAL HIGH (ref 70–99)
Glucose-Capillary: 132 mg/dL — ABNORMAL HIGH (ref 70–99)
Glucose-Capillary: 140 mg/dL — ABNORMAL HIGH (ref 70–99)
Glucose-Capillary: 144 mg/dL — ABNORMAL HIGH (ref 70–99)
Glucose-Capillary: 209 mg/dL — ABNORMAL HIGH (ref 70–99)
Glucose-Capillary: 87 mg/dL (ref 70–99)

## 2019-04-16 MED ORDER — QUETIAPINE FUMARATE 25 MG PO TABS
12.5000 mg | ORAL_TABLET | Freq: Every day | ORAL | Status: DC
  Administered 2019-04-16 – 2019-04-17 (×2): 12.5 mg
  Filled 2019-04-16 (×2): qty 1

## 2019-04-16 MED ORDER — LORAZEPAM 2 MG/ML IJ SOLN
1.0000 mg | Freq: Once | INTRAMUSCULAR | Status: AC
  Administered 2019-04-16: 1 mg via INTRAVENOUS
  Filled 2019-04-16: qty 1

## 2019-04-16 MED ORDER — LORAZEPAM 2 MG/ML IJ SOLN
1.0000 mg | Freq: Four times a day (QID) | INTRAMUSCULAR | Status: DC | PRN
  Administered 2019-04-16: 1 mg via INTRAVENOUS
  Filled 2019-04-16: qty 1

## 2019-04-16 MED ORDER — LORAZEPAM 2 MG/ML IJ SOLN
1.0000 mg | INTRAMUSCULAR | Status: DC | PRN
  Administered 2019-04-17: 1 mg via INTRAVENOUS
  Filled 2019-04-16: qty 1

## 2019-04-16 NOTE — Significant Event (Signed)
Rapid Response Event Note  Overview: Time Called: 0809 Arrival Time: 0814 Event Type: Respiratory  Initial Focused Assessment: Patient with increased WOB and increased RR.  Audible rhonchi.  Lung sounds with rhonchi.  Mouth mucosa dry.  Patient is agitated and restless.  BP 124/73  HR 128  RR 48  O2 sat 95% on RA  Temp 101.5 axilary  Per RN cortrak clamped and pt only ate a couple of spoonfuls yesterday.  Yesterday he was able to walk a short distance.    This am he is not following commands and is distressed.  Interventions: NTS - moderate amount thick beige/tan secretions Oral care MD notified.  Upper airway improved sounds but still labored and lung sounds with rhonchi  PCXR   Plan of Care (if not transferred): Goals of care meeting with family   Event Summary: Name of Physician Notified: Edsel Petrin at [phone]    at    Outcome: Stayed in room and stabalized  Event End Time: 0840  Marcellina Millin

## 2019-04-16 NOTE — Progress Notes (Signed)
OT Cancellation Note  Patient Details Name: PATTRICK BADY MRN: [id number] DOB: [birth year]   Cancelled Treatment:    Reason Eval/Treat Not Completed: Medical issues which prohibited therapy.  Pt had been agitated all am, and finally calm.  Will reattempt.  Eber Jones., OTR/L Acute Rehabilitation Services Pager [phone] Office [phone]   Jeani Hawking M 04/16/2019, 4:22 PM

## 2019-04-16 NOTE — Progress Notes (Signed)
Patient ID: Jonathan Berger, male   DOB: 04-15-1931, 86 y.o.   MRN: [id number]  This NP visited patient at the bedside as a follow up for palliative medicine needs, and emotional support.    Patient's daughter/[name]  at the bedside.  Received call from  daughter/[name] by telephone for continued conversation regarding current medical situation.  She is very optimistic tell me her father walked in the hall yesterday and was tolerating oral diet pretty well.  We discussed that although he has been cleared for po diet and is taking orals,  at this time he remains  high risk for inadequate oral intake and aspiration.    We will continue to monitor.  [name] asks that nursing all "be on the same page" regarding commended diet/apparently there was some confusion yesterday.    I discussed and requested [name] from speech therapy to provide family with list of appropriate foods for patient in anticipation of discharge home.  Patient remains high risk for decompensation secondary to multiple co-morbidities  After the phone conversation I visited patient at bedside for assessment for palliative medicine needs and emotional support. Daughter/[name] is at bedside.  Rapid response was called this morning at 8:40 AM for increased work of breathing, increased respirations, audible rhonchi, tachycardia and elevated temp/101.5 axillary.  chest x-ray ordered/demonstrated bronchitis.  RN in conversation with attending/Dr. Catha Gosselin  I discussed case with Dr. Zada Finders and she plans to speak with daughter/[name] by telephone this morning.  Family remains hopeful for improvement and plan is to leave core track in place, continue to encourage oral intake, Foley catheter remains in place to attempt voiding trial prior to discharge/continue Proscar.  Monitor patient over the next 24 to 48 hours hoping for stabilization and transition home per family goals of care, discuss hospice versus palliative at time of discharge.     Plan  for today -DNR/DNI -Family member has been approved to be at the bedside 24/7 -Continue to encourage oral intake with recommended diet by speech therapy -Continue to address patient's urinary retention issues, Flomax added reevaluate in the morning.  Patient may need voiding trial prior to discharge and likely outpatient urology consult    Possibly patient could be discharged with Foley cath -- PMT continue to support holistically -[name]/ LCSW working with family about home health services and disposition plan.  This nurse practitioner informed  the family and the attending that I will be out of the hospital until Monday morning.  If the patient is still hospitalized I will follow-up at that time.     A palliative medicine team provider will follow up with this patient/family throughout the weekend, and please callteam phone # [phone]   with any needs questions or concerns   Questions and concerns addressed   Discussed with Dr  Catha Gosselin  Total time spent on the unit was 40  minutes  Greater than 50% of the time was spent in counseling and coordination of care  [name] NP  Palliative Medicine Team Team Phone # [phone] Pager [phone]

## 2019-04-16 NOTE — Progress Notes (Signed)
PT Cancellation Note  Patient Details Name: Jonathan Berger MRN: [id number] DOB: [birth year]   Cancelled Treatment:    Reason Eval/Treat Not Completed: Other (comment) Pt agitated this AM and now resting. Pt wife requesting we re-attempt at another time.    Lillia Pauls, PT, DPT Acute Rehabilitation Services Pager [phone] Office [phone]    Norval Morton 04/16/2019, 3:56 PM

## 2019-04-16 NOTE — Progress Notes (Signed)
At lunchtime today patient was not alert enough to follow any commands, and continued to slide down in the bed, unable to be redirected. Determined unsafe for PO intake at that time. For supper tonight patient was placed in upright sitting position, and was able to say short phrases like "hello" and "I want to go home" and followed some commands. Pt was able to swallow 2 sips of thickened milk, however when feeding patient with mashed sweet potato it sat on his toungue and he was unable to swallow. Sweet potato was suctioned out of patient's mouth. Instructed family to not give patient anything else by mouth at this time. Pt continues to slide down in bed attempting to get out of the bed.

## 2019-04-16 NOTE — Progress Notes (Signed)
MEWS score elevated (7 red) related to RR 48 with labored breathing and diaphoretic, HR 128, temp 101.5. Rapid response RN notified, and Dr. Catha Gosselin notified. Orders for chest and abdominal xray, PRN tylenol given rectally as pt not safe for PO intake, and unconfirmed coretrak placement due to pt thrashing and NT suctioning. 1 mg ativan given IV. Pt unable to follow commands. Very thick tan/yellow secretions noted in the back of the patients throat noted. Mouth dry, oral care performed multiple times.

## 2019-04-16 NOTE — Progress Notes (Addendum)
  Speech Language Pathology Treatment: Dysphagia  Patient Details Name: Jonathan Berger MRN: [id number] DOB: May 27, 1931 Today's Date: 04/16/2019 Time: 0109-3235 SLP Time Calculation (min) (ACUTE ONLY): 22 min  Assessment / Plan / Recommendation Clinical Impression  SLP notified by RN that pt required rapid response nurse and may have aspirated recently as he is congested, fever earlier of 101.1, however CXR only showed bronchitis. SLP arrived and found pt with bed reclined, sweet potatoes in mouth with oral cavity open and gurgly. He was positioned higher in bed and slid down (therapist observed later in session). Speech noncoherent this session. Assisted upright and suctioned significant amount sweet potato from oral cavity. Respirations were tachypneic. Attempted a smoother, more puree texture of magic cup ice cream however remained on tongue without attempts to propel. He clearly was unable to attend to eating and oral cavity cleaned and pt reclined slightly. Discussed with RN and pt's wife to hold po's if he continues to present this way. He may be having episodic aspiration that will likely not improve. Discussed this with daughter [name] yesterday. Most optimal scenario for food/liquid presentations (honey liquids; "softer" foods from regular menu) is upright in bed utilizing reverse Trendelenburg positioning, ability to attend to tasks with redirection and adequate respirations. ST can continue to offer supportive education re: dysphagia.   HPI HPI: Pt is an 86 year old male presenting 2/7 with slurred speech and dysphagia, found to have acute R MCA stroke s/p tPA and EVR R MCA and ICA with stent. ETT 2/7-2/11. PMH: HTN. Intubated 2/7-2/11.      SLP Plan  Continue with current plan of care       Recommendations  Diet recommendations: Regular;Other(comment);Honey-thick liquid(with family ordering soft foods) Liquids provided via: Teaspoon Medication Administration: Crushed with  puree Supervision: Staff to assist with self feeding;Full supervision/cueing for compensatory strategies;Trained caregiver to feed patient Compensations: Minimize environmental distractions;Slow rate;Small sips/bites;Lingual sweep for clearance of pocketing Postural Changes and/or Swallow Maneuvers: Seated upright 90 degrees                Oral Care Recommendations: Oral care BID Follow up Recommendations: Home health SLP SLP Visit Diagnosis: Dysphagia, oropharyngeal phase (R13.12) Plan: Continue with current plan of care       GO                Royce Macadamia 04/16/2019, 2:14 PM  Breck Coons Lonell Face.Ed Nurse, children's [phone] Office [phone]

## 2019-04-16 NOTE — Progress Notes (Addendum)
PROGRESS NOTE    Jonathan Berger  ZDG:[id number] DOB: [birth year] DOA: 03/27/2019 PCP: Jonathan Huddle, MD   Brief Narrative:  HPI On 04/04/2019 by Dr. Kerney Elbe (neurology) Jonathan Berger is an 86 y.o. male with a PMHx of HTN who presented to the [facility] ED by PV for slurred speech and left facial droop. Code Stroke initiated in the ED.  Wife reports patient was LKN at 1215, which was also the time of symptom onset. He was walking and talking as usual prior to this, at 11:00. She does state that he had been sleeping off and on this morning. At 1215 she noticed that he "sounded funny" and says she recognized that his speech was slurred and that he was drooling out of the left side of his mouth. She yelled for her son to come downstairs and while her back was turned the patient tried to get out of the chair and fell to the floor. He tried to get up and again fell. Some of her son's friends helped get him off the floor and into the car. Per wife, patient takes the following medications. Valsartan-amlodipine-HCTZ, donepezil and memantine.  Interim history Patient admitted with acute CVA by neurology, status post TPA and VIR intervention with balloon angioplasty and stent placement at the proximal right ICA.  Patient developed right lower lobe aspiration pneumonitis after extubation on 03/26/2019 and was reintubated on 03/30/2019 with subsequent extubation on 04/01/2019.  Found to have new systolic CHF with an EF of 45%.  Patient was transferred to [facility] on 04/03/2019.  Currently has core track in place given dysphagia.  Palliative care consulted.  Assessment & Plan   Acute hypoxemic respiratory failure secondary to recent CVA/aspiration pneumonia and CHF -Patient did require intubation twice in extubated on 04/01/2019 -Is completed 7-day course of cefepime and vancomycin -Patient with tachypnea and fever this morning, repeat chest x-ray showing bronchitis -Have added flutter valve -will order sputum culture  if possible   Right MCA infarct -Status post tPA and left ICA stent placement -MRI brain with right temporal lobe cortical/subcortical infarct with numerous small scattered right brain infarcts -CTA head and neck showed right MCA LVO mid and distal segment.  Right ICA critical stenosis, left ICA distal bulb string sign, severe B VA origin stenosis. -Repeat CT head on 04/06/2019 is unremarkable -Echocardiogram showed an EF of 95%, grade 1 diastolic dyspnea -Carotid Doppler showed left ICA 60 to 79% stenosis -LDL 80, hemoglobin A1c 5.9 -Currently on Brilinta, aspirin, Lipitor -PT recommended supervision/assistance 24-hour, home health (patient still at SNF level) -Speech therapy recommending dysphagia 1 pure solids, honey thick liquids -OT recommending home health, tub/shower bench, 3 and 1 bedside commode, wheelchair with cushion -?  Plan for further left ICA intervention in the future  Acute systolic and diastolic heart failure/multifocal atrial tachycardia -Echocardiogram as noted above -Audiology consulted and appreciated, plan to repeat echocardiogram as outpatient and consider ischemic evaluation if EF remains depressed -Continue metoprolol  Acute urinary retention -Currently has Foley catheter in place.  Patient has pulled out on 3/1 -Continue Proscar -Attempt voiding trial prior to discharge  Essential hypertension -Continue amlodipine -Valsartan/HCTZ on hold  Dysphagia -status post MBS, now on dysphagia diet -Patient currently does have a core track in place which may be adding to some of his agitation -Discussed with nurse, would like to have his intake documented.  It seems that yesterday, 04/15/2019, patient was only able to take a few sips and some pudding with medications.  This  does not appear to be enough to sustain him. -?  Patient currently on regular diet, although per speech therapy recommendations on 04/14/2019, dysphagia 1 diet was recommended.  Will need to confirm  this with speech therapy.  Dementia -Suspect some delirium likely secondary to being in the hospital in ICU -Continue Seroquel, Aricept and soft restraints as needed -Ativan added  Hypernatremia -IV fluids were discontinued -Sodium up to 149 today -Free water held on 3/3. Discussed with nursing, will resume today. -Continue to monitor   Hypokalemia -resolved with replacement -will continue to monitor   Chronic kidney disease, stage III -cannot specify A or B at this time -will continue to monitor  -Continue free water   Goals of care -Patient appears to have very poor prognosis.   -Currently DNR. -Palliative care consulted and appreciated  DVT Prophylaxis  Lovenox   Code Status: DNR  Family Communication: Daughter at bedside. Daughter, Marlowe Kays, via phone as well.   Disposition Plan: Admitted from home for Acute CVA.   Consultants Neurology PCCM Cardiology Palliative care Inpatient rehab  Procedures  Intubation/extubation Echocardiogram Carotid doppler tPA and IR with L ICA stent placement   Antibiotics   Anti-infectives (From admission, onward)   Start     Dose/Rate Route Frequency Ordered Stop   03/31/19 1100  vancomycin (VANCOCIN) IVPB 1000 mg/200 mL premix     1,000 mg 200 mL/hr over 60 Minutes Intravenous Every 24 hours 03/31/19 1027 04/04/19 1356   03/30/19 1000  ceFEPIme (MAXIPIME) 2 g in sodium chloride 0.9 % 100 mL IVPB     2 g 200 mL/hr over 30 Minutes Intravenous Every 12 hours 03/30/19 0806 04/04/19 2212   03/29/19 1230  vancomycin (VANCOREADY) IVPB 1750 mg/350 mL  Status:  Discontinued     1,750 mg 175 mL/hr over 120 Minutes Intravenous Every 48 hours 03/29/19 1141 03/31/19 1027   03/29/19 1200  ceFEPIme (MAXIPIME) 2 g in sodium chloride 0.9 % 100 mL IVPB  Status:  Discontinued     2 g 200 mL/hr over 30 Minutes Intravenous Every 24 hours 03/29/19 1129 03/30/19 0806   03/29/19 1130  vancomycin (VANCOCIN) IVPB 1000 mg/200 mL premix  Status:   Discontinued     1,000 mg 200 mL/hr over 60 Minutes Intravenous  Once 03/29/19 1129 03/29/19 1140   03/24/19 0930  ampicillin-sulbactam (UNASYN) 1.5 g in sodium chloride 0.9 % 100 mL IVPB  Status:  Discontinued     1.5 g 200 mL/hr over 30 Minutes Intravenous Every 8 hours 03/24/19 0852 03/29/19 1129   04/12/2019 1453  ceFAZolin (ANCEF) 2-4 GM/100ML-% IVPB    Note to Pharmacy: Novella Rob   : cabinet override      04/01/2019 1453 03/19/2019 1833      Subjective:   Jonathan Berger seen and examined today.  Patient with dementia.  Daughter at bedside states that patient is very fidgety and hopes to have a core track and Foley catheter removed, and hoping to get father home soon.  Overnight, patient did receive several doses of Ativan.  Currently in soft restraints.  Objective:   Vitals:   04/15/19 2342 04/16/19 0308 04/16/19 0817 04/16/19 0922  BP: (!) 142/85 (!) 158/109 124/73 122/62  Pulse: 82 (!) 117 (!) 128 (!) 116  Resp: 20 (!) 23 (!) 48 (!) 46  Temp:  98.3 F (36.8 C) (!) 101.5 F (38.6 C) 100 F (37.8 C)  TempSrc:  Oral Axillary Axillary  SpO2: 94% 96% 95% 94%  Weight:  Height:        Intake/Output Summary (Last 24 hours) at 04/16/2019 1018 Last data filed at 04/15/2019 2015 Gross per 24 hour  Intake --  Output 650 ml  Net -650 ml   Filed Weights   04/02/19 0500 04/07/19 0600 04/09/19 0401  Weight: 84.7 kg 86.2 kg 86.2 kg    Exam  General: Well developed, elderly, NAD  HEENT: NCAT, mucous membranes moist.   Cardiovascular: S1 S2 auscultated, RRR- tachycadic  Respiratory: tachypneic, crackles, congestion  Abdomen: Soft, nontender, nondistended, + bowel sounds  Extremities: warm dry without cyanosis clubbing or edema  Neuro: awake, not really alert, dementia, moves all extremities    Data Reviewed: I have personally reviewed following labs and imaging studies  CBC: Recent Labs  Lab 04/15/19 0716  WBC 8.3  HGB 11.2*  HCT 35.0*  MCV 99.7  PLT 536    Basic Metabolic Panel: Recent Labs  Lab 04/11/19 0317 04/11/19 0317 04/12/19 0413 04/13/19 0434 04/14/19 0841 04/15/19 0716 04/16/19 0256  NA 147*   < > 146* 146* 144 144 149*  K 4.1   < > 3.4* 3.6 3.3* 3.2* 3.5  CL 112*   < > 112* 111 112* 113* 117*  CO2 28   < > 25 26 21* 21* 18*  GLUCOSE 122*   < > 137* 134* 134* 135* 161*  BUN 32*   < > 33* 33* 27* 25* 28*  CREATININE 1.25*   < > 1.36* 1.28* 1.27* 1.34* 1.56*  CALCIUM 9.7   < > 9.3 9.6 9.3 9.6 10.4*  MG 2.4  --   --   --   --  2.0  --    < > = values in this interval not displayed.   GFR: Estimated Creatinine Clearance: 36.2 mL/min (A) (by C-G formula based on SCr of 1.56 mg/dL (H)). Liver Function Tests: No results for input(s): AST, ALT, ALKPHOS, BILITOT, PROT, ALBUMIN in the last 168 hours. No results for input(s): LIPASE, AMYLASE in the last 168 hours. No results for input(s): AMMONIA in the last 168 hours. Coagulation Profile: No results for input(s): INR, PROTIME in the last 168 hours. Cardiac Enzymes: No results for input(s): CKTOTAL, CKMB, CKMBINDEX, TROPONINI in the last 168 hours. BNP (last 3 results) No results for input(s): PROBNP in the last 8760 hours. HbA1C: No results for input(s): HGBA1C in the last 72 hours. CBG: Recent Labs  Lab 04/15/19 1649 04/15/19 2010 04/15/19 2344 04/16/19 0443 04/16/19 0751  GLUCAP 97 123* 139* 144* 140*   Lipid Profile: No results for input(s): CHOL, HDL, LDLCALC, TRIG, CHOLHDL, LDLDIRECT in the last 72 hours. Thyroid Function Tests: No results for input(s): TSH, T4TOTAL, FREET4, T3FREE, THYROIDAB in the last 72 hours. Anemia Panel: No results for input(s): VITAMINB12, FOLATE, FERRITIN, TIBC, IRON, RETICCTPCT in the last 72 hours. Urine analysis:    Component Value Date/Time   COLORURINE YELLOW 03/23/2019 1202   APPEARANCEUR CLEAR 03/23/2019 1202   LABSPEC 1.015 03/23/2019 1202   PHURINE 6.0 03/23/2019 1202   GLUCOSEU NEGATIVE 03/23/2019 1202   HGBUR LARGE  (A) 03/23/2019 1202   BILIRUBINUR NEGATIVE 03/23/2019 1202   KETONESUR NEGATIVE 03/23/2019 1202   PROTEINUR NEGATIVE 03/23/2019 1202   UROBILINOGEN 0.2 05/07/2011 1128   NITRITE NEGATIVE 03/23/2019 1202   LEUKOCYTESUR TRACE (A) 03/23/2019 1202   Sepsis Labs: '@LABRCNTIP' (procalcitonin:4,lacticidven:4)  )No results found for this or any previous visit (from the past 240 hour(s)).    Radiology Studies: DG Abd 1 View  Result  Date: 04/16/2019 CLINICAL DATA:  Feeding tube placement EXAM: ABDOMEN - 1 VIEW COMPARISON:  03/30/2019 FINDINGS: Feeding tube tip is in the distal stomach. Oral contrast material and decompressed colon. No bowel obstruction free air organomegaly. IMPRESSION: Feeding tube tip in the distal stomach. Electronically Signed   By: Rolm Baptise M.D.   On: 04/16/2019 09:20   DG Chest Port 1 View  Result Date: 04/16/2019 CLINICAL DATA:  86 year old male with history of acute respiratory distress. Confusion. EXAM: PORTABLE CHEST 1 VIEW COMPARISON:  Chest x-ray 04/05/2019. FINDINGS: A feeding tube is seen extending into the abdomen, however, the tip of the feeding tube extends below the lower margin of the image. Lung volumes are slightly low. Diffuse peribronchial cuffing. No consolidative airspace disease. No pleural effusions. No pneumothorax. No pulmonary nodule or mass noted. Pulmonary vasculature and the cardiomediastinal silhouette are within normal limits. IMPRESSION: 1. The appearance of the lungs is concerning for bronchitis. Electronically Signed   By: Vinnie Langton M.D.   On: 04/16/2019 09:29   DG Swallowing Func-Speech Pathology  Result Date: 04/14/2019 Objective Swallowing Evaluation: Type of Study: MBS-Modified Barium Swallow Study  Patient Details Name: RIGOBERTO REPASS MRN: [id number] Date of Birth: January 06, 1932 Today's Date: 04/14/2019 Time: SLP Start Time (ACUTE ONLY): 1318 -SLP Stop Time (ACUTE ONLY): 1342 SLP Time Calculation (min) (ACUTE ONLY): 24 min Past Medical  History: Past Medical History: Diagnosis Date . Anemia  . Chronic back pain   herniated disc . Hepatitis   50+yrs ago . Hypertension   takes Amlodipine/Valsartan/HCTZ daily . Personal history of kidney stones  Past Surgical History: Past Surgical History: Procedure Laterality Date . BACK SURGERY  1977/006 . COLONOSCOPY   . CYSTOSCOPY   . ESOPHAGOGASTRODUODENOSCOPY   . IR ANGIO INTRA EXTRACRAN SEL COM CAROTID INNOMINATE UNI L MOD SED  03/24/2019 . IR ANGIO VERTEBRAL SEL SUBCLAVIAN INNOMINATE UNI L MOD SED  04/08/2019 . IR CT HEAD LTD  03/29/2019 . IR ENDOVASC INTRACRANIAL INF OTHER THAN THROMBO ART INC DIAG ANGIO  04/08/2019 . IR INTRAVSC STENT CERV CAROTID W/O EMB-PROT MOD SED INC ANGIO  03/24/2019 . LITHOTRIPSY   . LUMBAR LAMINECTOMY  01/29/2011  Procedure: MICRODISCECTOMY LUMBAR LAMINECTOMY;  Surgeon: Marybelle Killings;  Location: [place];  Service: Orthopedics;  Laterality: Right;  Right L4-5 Microdiscectomy . RADIOLOGY WITH ANESTHESIA N/A 04/10/2019  Procedure: RADIOLOGY WITH ANESTHESIA;  Surgeon: Luanne Bras, MD;  Location: [place];  Service: Radiology;  Laterality: N/A; HPI: Pt is an 86 year old male presenting 2/7 with slurred speech and dysphagia, found to have acute R MCA stroke s/p tPA and EVR R MCA and ICA with stent. ETT 2/7-2/11. PMH: HTN. Intubated 2/7-2/11.  Subjective: alert, confused Assessment / Plan / Recommendation CHL IP CLINICAL IMPRESSIONS 04/14/2019 Clinical Impression Pt's daughter, Marlowe Kays present during [place]. Pt was calm, mostly cooperative and responded to encouragement from daughter to continue accepting barium trials. Oral phase marked by lingual rocking, decreased cohesion, delayed propulsion, piecemeal swallowing with anterior spill and lingual residue. Laryngeal vestibule penetration iwth nectar which mostly exited vestibule during the swallow. Timing of laryngeal closure was delayed and thin barium fell into airway and was aspirated from vallecular and pyriform sinus residue with delayed cough.  Reduced tongue base retraction led to mild-moderate vallecular and pyriform sinus residue increasing risk for aspiration. Pt's prognosis for swallow improvement is fair-poor due to his cognition and deconditioning. Discussed pt's chronic aspiration risk and likely malnutrition despite consumption of honey thick liquids, Dys 1 (puree) with daughter Marlowe Kays) who verbalizing  understanding. Sit upright, crush pills and full supervision. ST will continue for education. SLP Visit Diagnosis -- Attention and concentration deficit following -- Frontal lobe and executive function deficit following -- Impact on safety and function --   CHL IP TREATMENT RECOMMENDATION 04/14/2019 Treatment Recommendations Therapy as outlined in treatment plan below   Prognosis 04/14/2019 Prognosis for Safe Diet Advancement Fair Barriers to Reach Goals Cognitive deficits;Behavior Barriers/Prognosis Comment -- CHL IP DIET RECOMMENDATION 04/14/2019 SLP Diet Recommendations Dysphagia 1 (Puree) solids;Honey thick liquids Liquid Administration via Cup Medication Administration Crushed with puree Compensations Minimize environmental distractions;Slow rate;Small sips/bites;Lingual sweep for clearance of pocketing;Monitor for anterior loss;Clear throat after each swallow Postural Changes Seated upright at 90 degrees   CHL IP OTHER RECOMMENDATIONS 04/14/2019 Recommended Consults -- Oral Care Recommendations Oral care BID Other Recommendations Order thickener from pharmacy   CHL IP FOLLOW UP RECOMMENDATIONS 04/14/2019 Follow up Recommendations Home health SLP   CHL IP FREQUENCY AND DURATION 04/14/2019 Speech Therapy Frequency (ACUTE ONLY) min 2x/week Treatment Duration 2 weeks      CHL IP ORAL PHASE 04/14/2019 Oral Phase Impaired Oral - Pudding Teaspoon -- Oral - Pudding Cup -- Oral - Honey Teaspoon -- Oral - Honey Cup Lingual pumping;Reduced posterior propulsion Oral - Nectar Teaspoon -- Oral - Nectar Cup Lingual pumping;Reduced posterior propulsion Oral - Nectar Straw  -- Oral - Thin Teaspoon -- Oral - Thin Cup Lingual pumping;Reduced posterior propulsion;Left anterior bolus loss;Right anterior bolus loss Oral - Thin Straw -- Oral - Puree Lingual pumping;Reduced posterior propulsion Oral - Mech Soft Lingual/palatal residue Oral - Regular -- Oral - Multi-Consistency -- Oral - Pill -- Oral Phase - Comment --  CHL IP PHARYNGEAL PHASE 04/14/2019 Pharyngeal Phase Impaired Pharyngeal- Pudding Teaspoon -- Pharyngeal -- Pharyngeal- Pudding Cup -- Pharyngeal -- Pharyngeal- Honey Teaspoon -- Pharyngeal -- Pharyngeal- Honey Cup Pharyngeal residue - pyriform;Pharyngeal residue - valleculae Pharyngeal -- Pharyngeal- Nectar Teaspoon -- Pharyngeal -- Pharyngeal- Nectar Cup Penetration/Aspiration during swallow Pharyngeal -- Pharyngeal- Nectar Straw -- Pharyngeal -- Pharyngeal- Thin Teaspoon -- Pharyngeal -- Pharyngeal- Thin Cup Penetration/Aspiration before swallow;Penetration/Aspiration during swallow;Pharyngeal residue - valleculae Pharyngeal -- Pharyngeal- Thin Straw -- Pharyngeal -- Pharyngeal- Puree Pharyngeal residue - pyriform Pharyngeal -- Pharyngeal- Mechanical Soft -- Pharyngeal -- Pharyngeal- Regular -- Pharyngeal -- Pharyngeal- Multi-consistency -- Pharyngeal -- Pharyngeal- Pill -- Pharyngeal -- Pharyngeal Comment --  CHL IP CERVICAL ESOPHAGEAL PHASE 04/14/2019 Cervical Esophageal Phase WFL Pudding Teaspoon -- Pudding Cup -- Honey Teaspoon -- Honey Cup -- Nectar Teaspoon -- Nectar Cup -- Nectar Straw -- Thin Teaspoon -- Thin Cup -- Thin Straw -- Puree -- Mechanical Soft -- Regular -- Multi-consistency -- Pill -- Cervical Esophageal Comment -- Houston Siren 04/14/2019, 9:49 PM Orbie Pyo Litaker M.Ed Actor Pager [phone] Office [phone]                Scheduled Meds: . amLODipine  5 mg Per Tube Daily  . aspirin  81 mg Per Tube Daily  . atorvastatin  40 mg Per Tube q1800  . chlorhexidine gluconate (MEDLINE KIT)  15 mL Mouth Rinse BID  .  Chlorhexidine Gluconate Cloth  6 each Topical Daily  . donepezil  5 mg Per Tube QHS  . enoxaparin (LOVENOX) injection  40 mg Subcutaneous Q24H  . feeding supplement (PRO-STAT SUGAR FREE 64)  30 mL Per Tube TID  . finasteride  5 mg Oral Daily  . folic acid  1 mg Per Tube Daily  . free water  200 mL Per Tube Q6H  .  Gerhardt's butt cream   Topical BID  . insulin aspart  0-9 Units Subcutaneous Q4H  . LORazepam  1 mg Intravenous QHS  . mouth rinse  15 mL Mouth Rinse 10 times per day  . metoprolol tartrate  75 mg Per Tube BID  . pantoprazole sodium  40 mg Per Tube BID  . QUEtiapine  25 mg Per Tube QHS  . thiamine  100 mg Per Tube Daily  . ticagrelor  90 mg Oral BID   Or  . ticagrelor  90 mg Per Tube BID   Continuous Infusions: . sodium chloride    . sodium chloride Stopped (04/01/19 1509)  . feeding supplement (JEVITY 1.5 CAL/FIBER) 1,000 mL (04/12/19 0739)     LOS: 25 days   Time Spent in minutes   45 minutes  Coralynn Gaona D.O. on 04/16/2019 at 10:18 AM  Between 7am to 7pm - Please see pager noted on [email or web address]  After 7pm go to [email or web address]  And look for the night coverage person covering for me after hours  [facility] Office  [phone]

## 2019-04-16 NOTE — Progress Notes (Signed)
MEWS 3 due to RR 23 and HR 117. Pt extremely restless and agitated, pulling at restraints and foley catheter. Mittens reapplied, provider notified.

## 2019-04-17 ENCOUNTER — Encounter (HOSPITAL_COMMUNITY): Payer: Self-pay | Admitting: Neurology

## 2019-04-17 ENCOUNTER — Inpatient Hospital Stay (HOSPITAL_COMMUNITY): Payer: Medicare Other

## 2019-04-17 DIAGNOSIS — Z515 Encounter for palliative care: Secondary | ICD-10-CM

## 2019-04-17 LAB — CBC
HCT: 41.1 % (ref 39.0–52.0)
Hemoglobin: 12.9 g/dL — ABNORMAL LOW (ref 13.0–17.0)
MCH: 32.2 pg (ref 26.0–34.0)
MCHC: 31.4 g/dL (ref 30.0–36.0)
MCV: 102.5 fL — ABNORMAL HIGH (ref 80.0–100.0)
Platelets: 162 10*3/uL (ref 150–400)
RBC: 4.01 MIL/uL — ABNORMAL LOW (ref 4.22–5.81)
RDW: 14.8 % (ref 11.5–15.5)
WBC: 5.2 10*3/uL (ref 4.0–10.5)
nRBC: 0 % (ref 0.0–0.2)

## 2019-04-17 LAB — BASIC METABOLIC PANEL
Anion gap: 11 (ref 5–15)
BUN: 37 mg/dL — ABNORMAL HIGH (ref 8–23)
CO2: 21 mmol/L — ABNORMAL LOW (ref 22–32)
Calcium: 10 mg/dL (ref 8.9–10.3)
Chloride: 121 mmol/L — ABNORMAL HIGH (ref 98–111)
Creatinine, Ser: 1.54 mg/dL — ABNORMAL HIGH (ref 0.61–1.24)
GFR calc Af Amer: 47 mL/min — ABNORMAL LOW (ref >60)
GFR calc non Af Amer: 40 mL/min — ABNORMAL LOW (ref >60)
Glucose, Bld: 126 mg/dL — ABNORMAL HIGH (ref 70–99)
Potassium: 3.7 mmol/L (ref 3.5–5.1)
Sodium: 153 mmol/L — ABNORMAL HIGH (ref 135–145)

## 2019-04-17 LAB — GLUCOSE, CAPILLARY: Glucose-Capillary: 123 mg/dL — ABNORMAL HIGH (ref 70–99)

## 2019-04-17 LAB — OSMOLALITY: Osmolality: 332 mOsm/kg (ref 275–295)

## 2019-04-17 MED ORDER — VANCOMYCIN HCL 1500 MG/300ML IV SOLN
1500.0000 mg | Freq: Once | INTRAVENOUS | Status: DC
  Filled 2019-04-17: qty 300

## 2019-04-17 MED ORDER — MORPHINE BOLUS VIA INFUSION
1.0000 mg | INTRAVENOUS | Status: DC | PRN
  Administered 2019-04-17 (×7): 1 mg via INTRAVENOUS
  Filled 2019-04-17: qty 1

## 2019-04-17 MED ORDER — ONDANSETRON HCL 4 MG/2ML IJ SOLN: 4.0000 mg | Freq: Four times a day (QID) | INTRAMUSCULAR | Status: DC | PRN

## 2019-04-17 MED ORDER — PIPERACILLIN-TAZOBACTAM 3.375 G IVPB 30 MIN
3.3750 g | INTRAVENOUS | Status: DC
  Filled 2019-04-17: qty 50

## 2019-04-17 MED ORDER — MORPHINE SULFATE (PF) 2 MG/ML IV SOLN
2.0000 mg | Freq: Once | INTRAVENOUS | Status: AC
  Administered 2019-04-17: 2 mg via INTRAVENOUS
  Filled 2019-04-17: qty 1

## 2019-04-17 MED ORDER — MORPHINE 100MG IN NS 100ML (1MG/ML) PREMIX INFUSION
1.0000 mg/h | INTRAVENOUS | Status: DC
  Administered 2019-04-17: 1 mg/h via INTRAVENOUS
  Filled 2019-04-17: qty 100

## 2019-04-17 MED ORDER — HYPROMELLOSE (GONIOSCOPIC) 2.5 % OP SOLN: 1.0000 [drp] | Freq: Three times a day (TID) | OPHTHALMIC | Status: DC | PRN

## 2019-04-17 MED ORDER — LORAZEPAM 2 MG/ML IJ SOLN
0.5000 mg | INTRAMUSCULAR | Status: DC | PRN
  Administered 2019-04-17 – 2019-04-18 (×12): 1 mg via INTRAVENOUS
  Filled 2019-04-17 (×12): qty 1

## 2019-04-17 MED ORDER — MORPHINE SULFATE (PF) 2 MG/ML IV SOLN
2.0000 mg | Freq: Once | INTRAVENOUS | Status: AC
  Administered 2019-04-17: 2 mg via INTRAVENOUS

## 2019-04-17 MED ORDER — FREE WATER
200.0000 mL | Status: DC
  Administered 2019-04-17: 09:00:00 200 mL

## 2019-04-17 MED ORDER — MORPHINE SULFATE (PF) 2 MG/ML IV SOLN
1.0000 mg | INTRAVENOUS | Status: DC | PRN
  Administered 2019-04-17 – 2019-04-18 (×13): 2 mg via INTRAVENOUS
  Filled 2019-04-17 (×13): qty 1

## 2019-04-17 MED ORDER — MORPHINE SULFATE (PF) 2 MG/ML IV SOLN
1.0000 mg | Freq: Once | INTRAVENOUS | Status: DC
  Filled 2019-04-17: qty 1

## 2019-04-17 MED ORDER — GLYCOPYRROLATE 0.2 MG/ML IJ SOLN
0.4000 mg | INTRAMUSCULAR | Status: DC
  Administered 2019-04-17 – 2019-04-18 (×5): 0.4 mg via INTRAVENOUS
  Filled 2019-04-17 (×5): qty 2

## 2019-04-17 MED ORDER — PIPERACILLIN-TAZOBACTAM 3.375 G IVPB: 3.3750 g | Freq: Three times a day (TID) | INTRAVENOUS | Status: DC

## 2019-04-17 NOTE — Progress Notes (Deleted)
OT Cancellation Note  Patient Details Name: Jonathan Berger MRN: [id number] DOB: [birth year]   Cancelled Treatment:    Reason Eval/Treat Not Completed: Medical issues which prohibited therapy.  Pt with respiratory decompensation and currently on venti mask.  Will reattempt if appropriate.  Eber Jones., OTR/L Acute Rehabilitation Services Pager [phone] Office [phone]   Jeani Hawking M 04/17/2019, 10:45 AM

## 2019-04-17 NOTE — Progress Notes (Signed)
Responded to referral from Allean Found to continue support to patient at EOL. Family requested prayer.  Prayer was done.  Provided active Listening, ministry of presence, emotional and spiritual support to wife and daughter at bedside. Offered continued [employer].  Chaplain available as need.       Venida Jarvis, [place], [facility], Pager [phone]

## 2019-04-17 NOTE — Significant Event (Signed)
Rapid Response Event Note  Overview:Called d/t worsening tachypneic and congested breathing. RRT saw pt on day shift as well.  Time Called: 0500 Arrival Time: 0505 Event Type: Respiratory  Initial Focused Assessment: Pt laying in bed with eyes closed, in obvious respiratory distress, +WOB, +accessory muscle use, congested breathing. Pt will respond to painful stimulation but will not follow commands. Lungs rhonchi t/o. Skin warm and dry. T-97.6, HR-123, BP-138/79, RR-40, SpO2-93% on .45 VM.  Deep suctioning of throat performed-thick tan/beige secretions suctioned out.  Interventions: Deep oral suctioning->helped with congestion but pt still tachypeic and in distress. Morphine 2 mg x 1 ordered for comfort Plan of Care (if not transferred): Pt is a DNR. Pt is not a candidate for bipap d/t mental status and secretions. Pt has thick tan/beige secretions that require suctioning-suction as needed. Pt oral cavity very dry-frequent oral care. Pt is very tachpneic/air hungry. Give morphine and monitor response. Call RRT if further assistance needed. Event Summary: Name of Physician Notified: Bodenheimer, NP at [phone]    at    Outcome: Stayed in room and stabalized  Event End Time: 0520  Terrilyn Saver

## 2019-04-17 NOTE — Progress Notes (Signed)
   04/17/19 1008  Clinical Encounter Type  Visited With Patient and family together  Visit Type Patient actively dying  Referral From Nurse  Consult/Referral To Chaplain  Spiritual Encounters  Spiritual Needs Emotional;Grief support   Chaplain responded to consult for end of life. Jonathan Berger's wife Jonathan Berger was in the room at the bedside. Jonathan Berger expressed desire for maximum comfort and desire to have Lollie Sails experience minimal suffering. Chaplain offered ministry of presence and emotional support. Family requested a male missionary from the [employer] for a blessing over [place] at end of life. This Chaplain called three different [employer] with no success finding a missionary. This Chaplain handed the request for a blessing off to [employer] who will pray with the family. Wife Jonathan Berger notified Chaplain that she and Tomaz have 6 children in total and all are in route to the hospital to visit with their father. Chaplains remain available for support as needs arise.   Chaplain Resident, Amado Coe, M Div [phone] on-call pager

## 2019-04-17 NOTE — Progress Notes (Signed)
RN notified by CNA of MEWS of 6. Pt febrile 103.52f axillary, recheck rectal temperature of 104.36f. Tylenol suppository given. Pt deep suctioned, with very little relief. MD paged and updated on patient condition. RRT RN called and updated on patient situation. MD to reach out to family.    .. Vitals:   04/17/19 0401 04/17/19 0811 04/17/19 0825 04/17/19 0828  BP: 138/79 111/70  (!) 87/66  Pulse: (!) 123 (!) 120    Temp: 97.6 F (36.4 C) (!) 103.1 F (39.5 C) Comment: RN notified (!) 104.7 F (40.4 C)   Resp: 20 (!) 28    Height:      Weight: 79.3 kg     SpO2: 93% 100%    TempSrc: Axillary Axillary Rectal   BMI (Calculated): 24.39

## 2019-04-17 NOTE — Significant Event (Signed)
Rapid Response Event Note  Overview: MEWS RED - 6  Called by nurse to report RED MEWS - 6. Per nurse, patient's temperature - 103.1 Axillary, increased HR, and ongoing tachypnea. I asked the nurse to check a rectal temperature, if patient is febrile then treat temperature with APAP, cool the patient with ice if needed and keep the room cool, and notify the MD as well.   I briefly saw Mr. Fulfer this morning, he looked quite uncomfortable and his respiratory status was tenuous and has been. I suspect his is probably aspirating and staff have been suctioning him out. He looks like he is suffering and appears air hungry at times.   My clinical recommendation is that patient's comfort be the priority. Ongoing GOC discussions to be had with family today per staff.   Plan: -- Follow MEWS protocol for VS - recheck VS as ordered  -- Aspiration Precautions  -- Promote pain management and keeping the patient comfortable.   Marily Konczal R

## 2019-04-17 NOTE — Progress Notes (Addendum)
   Palliative Medicine Inpatient Follow Up Note  HPI: Per hospitalist note --> 30 yr oldmale with history of hypertension, who was admitted by Dr. [name] on 03/28/2019 for dense right ischemic MCA CVA, s/p TPA and VIR intervention with balloon angioplasty and stent placement at proximal R ICA.Ptdeveloped right lower lobe aspiration pneumonitis after extubation on 2/11/2021and was later re-intubated on 2/15 and extubated on 2/17 . Ptwas alsofound to have new systolic CHF with EF 85%.Pt had been managed in the ICU and transferred to the floor, [facility] assumed care on 04/03/19.  Today's Discussion (04/17/2019): I met with Ilyas and his daughter this morning at bedside. Odilon was experiencing notable air hunger and had suffered two rapid responses in the last 24 hours. I had a detailed conversation with patients wife, [name] and his daughter, Marlowe Kays. They have decided to make [facility] comfortable. Discussed what comfort in his instance looks like inclusive of initiation of opioids and benzodiazepines.   Comfort measures have been instituted. Family is agreeable to initiation of a morphine gtt to control his symptoms.   All questions answered.   SUMMARY OF RECOMMENDATIONS   DNR/DNI  Comfort Measures  Symptom Relief: Dyspnea:  - Morphine gtt  - Morphine bolus PRN Pain:  - Tylenol '650mg'$  PO/PR Q6H PRN  - Heat Pack PRN Fever:  - Tylenol as above Agitation: Anxiety:  - Lorazepam 0.5-'1mg'$  IV Q1H PRN Nausea:  - Zofran '4mg'$  IV Q6H PRN  Secretions:  - Glycopyrrolate 0.'4mg'$  IV Q4H ATC Dry Eyes:  - Artificial Tears PRN Xerostomia:  - Biotene 63m PRN  - BID oral care Urinary Retention:  - Maintain foley  Constipation:  - Bisacodyl '10mg'$  PR PRN QDay Spiritual:  - Chaplain consult  Time Started: 0940 AM Time Ended: 1046 AM Time Spent:  66 Greater than 50% of the time was spent in counseling and coordination of  care ______________________________________________________________________________________ Addendum: Met with patients daughter, CMarlowe Kays She expressed that she and all of her five other family members are "on board" with more of a comfort emphasis. We discussed how she was hopeful for another outcome but how this was a reality that she and her family must deal with.   I allowed her time to expressed herself. She was tearful. She stated that her biggest worry is her mother. She has a plan in place how they can all bound together as a family to act as a support to her.  Was able to offer therapeutic listening as a form of emotional support.   Time Started: 1115 AM Time Ended: 1150 AM Time Spent: 3[place] Team Cell Phone: [phone] utilize secure chat with additional questions, if there is no response within 30 minutes please call the above phone number  Palliative Medicine Team providers are available by phone from 7am to 7pm daily and can be reached through the team cell phone.  Should this patient require assistance outside of these hours, please call the patient's attending physician.

## 2019-04-17 NOTE — Progress Notes (Signed)
PROGRESS NOTE    Jonathan Berger  QHU:[id number] DOB: Aug 23, 1931 DOA: 03/21/2019 PCP: [name], MD   Brief Narrative:  HPI On 03/19/2019 by Dr. Kerney Elbe (neurology) Jonathan Berger is an 86 y.o. male with a PMHx of HTN who presented to the [facility] ED by PV for slurred speech and left facial droop. Code Stroke initiated in the ED.  Wife reports patient was LKN at 1215, which was also the time of symptom onset. He was walking and talking as usual prior to this, at 11:00. She does state that he had been sleeping off and on this morning. At 1215 she noticed that he "sounded funny" and says she recognized that his speech was slurred and that he was drooling out of the left side of his mouth. She yelled for her son to come downstairs and while her back was turned the patient tried to get out of the chair and fell to the floor. He tried to get up and again fell. Some of her son's friends helped get him off the floor and into the car. Per wife, patient takes the following medications. Valsartan-amlodipine-HCTZ, donepezil and memantine.  Interim history Patient admitted with acute CVA by neurology, status post TPA and VIR intervention with balloon angioplasty and stent placement at the proximal right ICA.  Patient developed right lower lobe aspiration pneumonitis after extubation on 03/26/2019 and was reintubated on 03/30/2019 with subsequent extubation on 04/01/2019.  Found to have new systolic CHF with an EF of 45%.  Patient was transferred to [facility] on 04/03/2019.  Currently has core track in place given dysphagia.  Palliative care consulted.  Assessment & Plan   Acute hypoxemic respiratory failure secondary to recent CVA/aspiration pneumonia and CHF/ Sepsis -Patient did require intubation twice in extubated on 04/01/2019 -Is completed 7-day course of cefepime and vancomycin -Patient with tachypnea and fever this morning, repeat chest x-ray showing bronchitis -have ordered repeat CXR, blood  cultures -feel that patient likely has aspirated -will place on vanc and zosyn empirically -obtained UA on 3/4, unremarkable for infection -currently on ventimask; using accessory muscles, increased work of breathing-breathing appears to be tenuous and patient appears to be suffering as well as being air hungry -Have explained all this to the daughter via phone, will give 1 dose of morphine for comfort -Pending further conversations with the daughter upon arrival   Right MCA infarct -Status post tPA and left ICA stent placement -MRI brain with right temporal lobe cortical/subcortical infarct with numerous small scattered right brain infarcts -CTA head and neck showed right MCA LVO mid and distal segment.  Right ICA critical stenosis, left ICA distal bulb string sign, severe B VA origin stenosis. -Repeat CT head on 04/06/2019 is unremarkable -Echocardiogram showed an EF of 46%, grade 1 diastolic dyspnea -Carotid Doppler showed left ICA 60 to 79% stenosis -LDL 80, hemoglobin A1c 5.9 -Currently on Brilinta, aspirin, Lipitor -PT recommended supervision/assistance 24-hour, home health (patient still at SNF level) -Speech therapy recommending dysphagia 1 pure solids, honey thick liquids -OT recommending home health, tub/shower bench, 3 and 1 bedside commode, wheelchair with cushion -?  Plan for further left ICA intervention in the future  Acute systolic and diastolic heart failure/multifocal atrial tachycardia -Echocardiogram as noted above -Audiology consulted and appreciated, plan to repeat echocardiogram as outpatient and consider ischemic evaluation if EF remains depressed -Continue metoprolol  Acute urinary retention -Currently has Foley catheter in place.  Patient has pulled out on 3/1 -Continue Proscar -Attempt voiding trial prior to  discharge  Essential hypertension -Continue amlodipine -Valsartan/HCTZ on hold  Dysphagia -status post MBS, now on dysphagia diet -Patient  currently does have a core track in place which may be adding to some of his agitation -Discussed with nurse, would like to have his intake documented.  It seems that yesterday, 04/15/2019, patient was only able to take a few sips and some pudding with medications.  This does not appear to be enough to sustain him. -Per speech therapy documentation on 04/16/2019, recommendations for regular diet, honey thick liquid  Dementia -Suspect some delirium likely secondary to being in the hospital in ICU -Continue Seroquel, Aricept and soft restraints as needed -Ativan added; Seroquel 12.5 mg added for daytime use  Hypernatremia/hyperchloremia -IV fluids were discontinued -Sodium up to 153 today; chloride 121 -Free water held on 3/3.  -Have increased her free water frequency -Continue to monitor   Hypokalemia -resolved with replacement -Ccontinue to monitor   Chronic kidney disease, stage III -cannot specify A or B at this time -will continue to monitor  -Continue free water   Goals of care -Patient appears to have very poor prognosis.   -Currently DNR. -Palliative care consulted and appreciated -Had long discussion with patient's daughter via phone.  Patient does not appear to be doing well with guarded prognosis.  Breathing appears to be tenuous, patient appears to be air hungry at this time.  Would like to move towards comfort however pending further decisions from family.  With daughter's permission, will give dose of morphine.  DVT Prophylaxis  Lovenox   Code Status: DNR  Family Communication: Daughter at bedside. Daughter, Marlowe Kays, via phone as well.   Disposition Plan: Admitted from home for Acute CVA.  Patient currently unstable and appears to have sepsis with fever, tach, tachycardia, tachypnea.  Unknown cause.  Will start IV biotics empirically.  Pending further decisions from family regarding possible comfort care.  Consultants Neurology PCCM Cardiology Palliative  care Inpatient rehab  Procedures  Intubation/extubation Echocardiogram Carotid doppler tPA and IR with L ICA stent placement   Antibiotics   Anti-infectives (From admission, onward)   Start     Dose/Rate Route Frequency Ordered Stop   03/31/19 1100  vancomycin (VANCOCIN) IVPB 1000 mg/200 mL premix     1,000 mg 200 mL/hr over 60 Minutes Intravenous Every 24 hours 03/31/19 1027 04/04/19 1356   03/30/19 1000  ceFEPIme (MAXIPIME) 2 g in sodium chloride 0.9 % 100 mL IVPB     2 g 200 mL/hr over 30 Minutes Intravenous Every 12 hours 03/30/19 0806 04/04/19 2212   03/29/19 1230  vancomycin (VANCOREADY) IVPB 1750 mg/350 mL  Status:  Discontinued     1,750 mg 175 mL/hr over 120 Minutes Intravenous Every 48 hours 03/29/19 1141 03/31/19 1027   03/29/19 1200  ceFEPIme (MAXIPIME) 2 g in sodium chloride 0.9 % 100 mL IVPB  Status:  Discontinued     2 g 200 mL/hr over 30 Minutes Intravenous Every 24 hours 03/29/19 1129 03/30/19 0806   03/29/19 1130  vancomycin (VANCOCIN) IVPB 1000 mg/200 mL premix  Status:  Discontinued     1,000 mg 200 mL/hr over 60 Minutes Intravenous  Once 03/29/19 1129 03/29/19 1140   03/24/19 0930  ampicillin-sulbactam (UNASYN) 1.5 g in sodium chloride 0.9 % 100 mL IVPB  Status:  Discontinued     1.5 g 200 mL/hr over 30 Minutes Intravenous Every 8 hours 03/24/19 0852 03/29/19 1129   04/09/2019 1453  ceFAZolin (ANCEF) 2-4 GM/100ML-% IVPB    Note  to Pharmacy: Novella Rob   : cabinet override      03/16/2019 1453 03/18/2019 1833      Subjective:   Mariane Duval seen and examined today.  Patient with dementia.  Appears to have respiratory distress.  Objective:   Vitals:   04/17/19 0811 04/17/19 0825 04/17/19 0828 04/17/19 0847  BP: 111/70  (!) 87/66 115/60  Pulse: (!) 120  (!) 120   Resp: (!) 28  (!) 30   Temp: (!) 103.1 F (39.5 C) (!) 104.7 F (40.4 C) (!) 104.5 F (40.3 C)   TempSrc: Axillary Rectal Rectal   SpO2: 100%  98%   Weight:      Height:         Intake/Output Summary (Last 24 hours) at 04/17/2019 0928 Last data filed at 04/17/2019 0402 Gross per 24 hour  Intake 180 ml  Output 600 ml  Net -420 ml   Filed Weights   04/07/19 0600 04/09/19 0401 04/17/19 0401  Weight: 86.2 kg 86.2 kg 79.3 kg   Exam  General: Well developed, ill-appearing,  respiratory distress  HEENT: NCAT, mucous membranes dry (ventimask in place)  Cardiovascular: S1 S2 auscultated, tachycardic  Respiratory: Coarse breath sounds (audible upon entering the room), tachypneic   Abdomen: Soft, nontender, nondistended, + bowel sounds  Extremities: warm dry without cyanosis clubbing or edema   Data Reviewed: I have personally reviewed following labs and imaging studies  CBC: Recent Labs  Lab 04/15/19 0716 04/17/19 0401  WBC 8.3 5.2  HGB 11.2* 12.9*  HCT 35.0* 41.1  MCV 99.7 102.5*  PLT 228 742   Basic Metabolic Panel: Recent Labs  Lab 04/11/19 0317 04/12/19 0413 04/13/19 0434 04/14/19 0841 04/15/19 0716 04/16/19 0256 04/17/19 0401  NA 147*   < > 146* 144 144 149* 153*  K 4.1   < > 3.6 3.3* 3.2* 3.5 3.7  CL 112*   < > 111 112* 113* 117* 121*  CO2 28   < > 26 21* 21* 18* 21*  GLUCOSE 122*   < > 134* 134* 135* 161* 126*  BUN 32*   < > 33* 27* 25* 28* 37*  CREATININE 1.25*   < > 1.28* 1.27* 1.34* 1.56* 1.54*  CALCIUM 9.7   < > 9.6 9.3 9.6 10.4* 10.0  MG 2.4  --   --   --  2.0  --   --    < > = values in this interval not displayed.   GFR: Estimated Creatinine Clearance: 36.7 mL/min (A) (by C-G formula based on SCr of 1.54 mg/dL (H)). Liver Function Tests: No results for input(s): AST, ALT, ALKPHOS, BILITOT, PROT, ALBUMIN in the last 168 hours. No results for input(s): LIPASE, AMYLASE in the last 168 hours. No results for input(s): AMMONIA in the last 168 hours. Coagulation Profile: No results for input(s): INR, PROTIME in the last 168 hours. Cardiac Enzymes: No results for input(s): CKTOTAL, CKMB, CKMBINDEX, TROPONINI in the last 168  hours. BNP (last 3 results) No results for input(s): PROBNP in the last 8760 hours. HbA1C: No results for input(s): HGBA1C in the last 72 hours. CBG: Recent Labs  Lab 04/16/19 1145 04/16/19 1621 04/16/19 1946 04/16/19 2356 04/17/19 0400  GLUCAP 209* 87 132* 122* 123*   Lipid Profile: No results for input(s): CHOL, HDL, LDLCALC, TRIG, CHOLHDL, LDLDIRECT in the last 72 hours. Thyroid Function Tests: No results for input(s): TSH, T4TOTAL, FREET4, T3FREE, THYROIDAB in the last 72 hours. Anemia Panel: No results for input(s): VITAMINB12,  FOLATE, FERRITIN, TIBC, IRON, RETICCTPCT in the last 72 hours. Urine analysis:    Component Value Date/Time   COLORURINE AMBER (A) 04/16/2019 1345   APPEARANCEUR HAZY (A) 04/16/2019 1345   LABSPEC 1.020 04/16/2019 1345   PHURINE 5.0 04/16/2019 1345   GLUCOSEU NEGATIVE 04/16/2019 1345   HGBUR LARGE (A) 04/16/2019 1345   BILIRUBINUR NEGATIVE 04/16/2019 1345   KETONESUR NEGATIVE 04/16/2019 1345   PROTEINUR 30 (A) 04/16/2019 1345   UROBILINOGEN 0.2 05/07/2011 1128   NITRITE NEGATIVE 04/16/2019 1345   LEUKOCYTESUR NEGATIVE 04/16/2019 1345   Sepsis Labs: _0 (procalcitonin:4,lacticidven:4)  )No results found for this or any previous visit (from the past 240 hour(s)).    Radiology Studies: DG Abd 1 View  Result Date: 04/16/2019 CLINICAL DATA:  Feeding tube placement EXAM: ABDOMEN - 1 VIEW COMPARISON:  03/30/2019 FINDINGS: Feeding tube tip is in the distal stomach. Oral contrast material and decompressed colon. No bowel obstruction free air organomegaly. IMPRESSION: Feeding tube tip in the distal stomach. Electronically Signed   By: Rolm Baptise M.D.   On: 04/16/2019 09:20   DG Chest Port 1 View  Result Date: 04/16/2019 CLINICAL DATA:  86 year old male with history of acute respiratory distress. Confusion. EXAM: PORTABLE CHEST 1 VIEW COMPARISON:  Chest x-ray 04/05/2019. FINDINGS: A feeding tube is seen extending into the abdomen, however, the  tip of the feeding tube extends below the lower margin of the image. Lung volumes are slightly low. Diffuse peribronchial cuffing. No consolidative airspace disease. No pleural effusions. No pneumothorax. No pulmonary nodule or mass noted. Pulmonary vasculature and the cardiomediastinal silhouette are within normal limits. IMPRESSION: 1. The appearance of the lungs is concerning for bronchitis. Electronically Signed   By: Vinnie Langton M.D.   On: 04/16/2019 09:29     Scheduled Meds: . amLODipine  5 mg Per Tube Daily  . aspirin  81 mg Per Tube Daily  . atorvastatin  40 mg Per Tube q1800  . chlorhexidine gluconate (MEDLINE KIT)  15 mL Mouth Rinse BID  . Chlorhexidine Gluconate Cloth  6 each Topical Daily  . donepezil  5 mg Per Tube QHS  . enoxaparin (LOVENOX) injection  40 mg Subcutaneous Q24H  . feeding supplement (PRO-STAT SUGAR FREE 64)  30 mL Per Tube TID  . finasteride  5 mg Oral Daily  . folic acid  1 mg Per Tube Daily  . free water  200 mL Per Tube Q4H  . Gerhardt's butt cream   Topical BID  . insulin aspart  0-9 Units Subcutaneous Q4H  . LORazepam  1 mg Intravenous QHS  . mouth rinse  15 mL Mouth Rinse 10 times per day  . metoprolol tartrate  75 mg Per Tube BID  . pantoprazole sodium  40 mg Per Tube BID  . QUEtiapine  12.5 mg Per Tube Daily  . QUEtiapine  25 mg Per Tube QHS  . thiamine  100 mg Per Tube Daily  . ticagrelor  90 mg Oral BID   Or  . ticagrelor  90 mg Per Tube BID   Continuous Infusions: . sodium chloride    . sodium chloride Stopped (04/01/19 1509)  . feeding supplement (JEVITY 1.5 CAL/FIBER) 1,000 mL (04/12/19 0739)     LOS: 26 days   Time Spent in minutes   45 minutes    D.O. on 04/17/2019 at 9:28 AM  Between 7am to 7pm - Please see pager noted on [email or web address]  After 7pm go to [email or web address]  And look for the night coverage  person covering for me after hours  [facility] Office  [phone]

## 2019-04-17 NOTE — Plan of Care (Signed)
  Problem: Coping: Goal: Will verbalize positive feelings about self Outcome: Progressing   Problem: Activity: Goal: Risk for activity intolerance will decrease Outcome: Not Progressing   Problem: Nutrition: Goal: Adequate nutrition will be maintained Outcome: Not Progressing   Problem: Coping: Goal: Level of anxiety will decrease Outcome: Not Progressing  Pt now comfort care. Family at bedside. PRN medications given

## 2019-04-17 NOTE — Progress Notes (Signed)
Pharmacy Antibiotic Note  Jonathan Berger is a 86 y.o. male admitted on 2019/04/20 with acute stroke.  Pharmacy has been consulted for vancomycin and Zosyn dosing for sepsis likely secondary to apsiration. Fever and tachypnea noted this morning. Febrile with Tm 104.7, WBC are normal, SCr up to 1.54 today.  Plan: Vancomycin 1500 mg IV load then 1000 mg IV q24h Goal AUC 400-550. Expected AUC: 502.7 SCr used: 1.54 Zosyn 3.375 g IV over 30 min then 3.375 g IV q8h (over 4 hrs) Monitor renal function, clinical progress, cultures/sensitivities F/U LOT and de-escalate as able Vancomycin levels as clinically indicated   Height: 5\' 11"  (180.3 cm) Weight: 174 lb 13.2 oz (79.3 kg) IBW/kg (Calculated) : 75.3  Temp (24hrs), Avg:100.3 F (37.9 C), Min:97.6 F (36.4 C), Max:104.7 F (40.4 C)  Recent Labs  Lab 04/13/19 0434 04/14/19 0841 04/15/19 0716 04/16/19 0256 04/17/19 0401  WBC  --   --  8.3  --  5.2  CREATININE 1.28* 1.27* 1.34* 1.56* 1.54*    Estimated Creatinine Clearance: 36.7 mL/min (A) (by C-G formula based on SCr of 1.54 mg/dL (H)).    No Known Allergies  Antimicrobials this admission: Vanc 2/14 >>2/20, 3/5 >> Cefepime 2/14 >>2/20 Unasyn 2/9>>2/14 Zosyn 3/5 >>  Dose adjustments this admission: n/a  Microbiology results: 2/7 covid / flu - negative 2/8: Trach aspirate: normal flora 2/8 MRSA PCR - negative 2/8 UCx - negative 2/8 TA - negative 2/14 BCx - Neg 2/17 TA - Staph Epidermidis (MRSE), few yeast.   Thank you for involving pharmacy in this patient's care.  3/17, PharmD, BCPS Clinical Pharmacist Clinical phone for 04/17/2019 until 3p is [phone] 04/17/2019 9:59 AM  **Pharmacist phone directory can be found on [email or web address] listed under [facility]**

## 2019-04-27 ENCOUNTER — Ambulatory Visit: Payer: Medicare Other | Admitting: Cardiology

## 2019-05-14 NOTE — Death Summary Note (Signed)
DEATH SUMMARY   Patient Details  Name: Jonathan Berger MRN: [id number] DOB: 1932-02-01  Admission/Discharge Information   Admit Date:  April 12, 2019  Date of Death: Date of Death: 05-09-2019  Time of Death: Time of Death: 0305  Length of Stay: May 30, 2022  Referring Physician: [name], MD   Reason(s) for Hospitalization  Admitted with slurred speech and left facial droop, found to have acute CVA  Diagnoses  Preliminary cause of death: Acute hypoxemic respiratory failure secondary to recent CVA and aspiration pneumonia along with CHF and sepsis Secondary Diagnoses (including complications and co-morbidities):  Principal Problem:   Middle cerebral artery embolism, right Active Problems:   Acute on chronic respiratory failure with hypoxia (HCC)   Systolic dysfunction   Dehydration   Aspiration pneumonia of right lower lobe due to gastric secretions (HCC)   Tachycardia   Hypoxia   Paroxysmal atrial fibrillation ([place])   Multifocal atrial tachycardia ([place])   Palliative care by specialist   Goals of care, counseling/discussion   DNR (do not resuscitate)   Muscular weakness   Dysphagia   Xerostomia   Delirium   Adult failure to thrive   Agitation   At high risk for aspiration   End of life care   Terminal care   Brief Hospital Course (including significant findings, care, treatment, and services provided and events leading to death)  HPI:On 04-12-2019 by Dr. Kerney Elbe (neurology) Jonathan Berger an 86 y.o.malewithaPMHx ofHTN who presented to [facility] ED by PV for slurred speech andleftfacial droop. CodeStroke initiated in the ED.  Wife reports patientwasLKN at 1213-05-06, which was also the time of symptom onset. He was walking and talking as usualprior to this, at 11:00. She does state that he had been sleeping off and on this morning. At 1215she noticed that he "sounded funny" and says she recognized that his speech was slurred and that he was drooling out of the left  side of his mouth. She yelled for her son to come downstairs and while her back was turned the patienttried to get out of the chair and fell to the floor. He tried to get up and again fell. Some of her son's friends helped get him off the floor and into the car. Per wife,patient takes the followingmedications. Valsartan-amlodipine-HCTZ, donepezil and memantine.  Interim history Patient admitted with acute CVA by neurology, status post TPA and VIR intervention with balloon angioplasty and stent placement at the proximal right ICA.  Patient developed right lower lobe aspiration pneumonitis after extubation on 03/26/2019 and was reintubated on 03/30/2019 with subsequent extubation on 04/01/2019.  Found to have new systolic CHF with an EF of 45%.  Patient was transferred to [facility] on 04/03/2019.  Currently has core track in place given dysphagia.  Palliative care consulted. Patient continued to decline from a respiratory status and was likely aspirating. Patient's breathing became tenuous and he appeared uncomfortable and air hungry. Patient was started on morphine. Palliative care had a discussion with patient's wife who agreed to transition to comfort care. He passed at 0305 this morning.  Hospital course: Acute hypoxemic respiratory failure secondary to recent CVA/aspiration pneumonia and CHF/ Sepsis -Patient did require intubation twice in extubated on 04/01/2019 -Is completed 7-day course of cefepime and vancomycin -Patient with tachypnea and fever this morning, repeat chest x-ray showing bronchitis -have ordered repeat CXR, blood cultures -feel that patient likely has aspirated -will place on vanc and zosyn empirically -obtained UA on 05/07/2022, unremarkable for infection -currently on ventimask; using accessory muscles,  increased work of breathing-breathing appears to be tenuous and patient appears to be suffering as well as being air hungry -Have explained all this to the daughter via phone, was given  morphine -see discussion below   Right MCA infarct -Status post tPA and left ICA stent placement -MRI brain with right temporal lobe cortical/subcortical infarct with numerous small scattered right brain infarcts -CTA head and neck showed right MCA LVO mid and distal segment.  Right ICA critical stenosis, left ICA distal bulb string sign, severe B VA origin stenosis. -Repeat CT head on 04/06/2019 is unremarkable -Echocardiogram showed an EF of 42%, grade 1 diastolic dyspnea -Carotid Doppler showed left ICA 60 to 79% stenosis -LDL 80, hemoglobin A1c 5.9 -Currently on Brilinta, aspirin, Lipitor -PT recommended supervision/assistance 24-hour, home health (patient still at SNF level) -Speech therapy recommending dysphagia 1 pure solids, honey thick liquids -OT recommending home health, tub/shower bench, 3 and 1 bedside commode, wheelchair with cushion -?  Plan for further left ICA intervention in the future  Acute systolic and diastolic heart failure/multifocal atrial tachycardia -Echocardiogram as noted above -Cardiology consulted and appreciated, plan to repeat echocardiogram as outpatient and consider ischemic evaluation if EF remains depressed -was on metoprolol  Acute urinary retention -Currently has Foley catheter in place.  Patient has pulled out on 3/1 -was on Proscar  Essential hypertension -Continued amlodipine -Valsartan/HCTZ on hold  Dysphagia -status post MBS, now on dysphagia diet -Patient currently does have a core track in place which may be adding to some of his agitation -Discussed with nurse, would like to have his intake documented.  It seems that yesterday, 04/15/2019, patient was only able to take a few sips and some pudding with medications.  This does not appear to be enough to sustain him. -Per speech therapy documentation on 04/16/2019, recommendations for regular diet, honey thick liquid  Dementia -Suspect some delirium likely secondary to being in the  hospital in ICU -Continue Seroquel, Aricept and soft restraints as needed -Ativan added; Seroquel 12.5 mg added for daytime use  Hypernatremia/hyperchloremia -IV fluids were discontinued -Sodium up to 153 today; chloride 121 -Free water held on 3/3.  -Have increased free water frequency  Hypokalemia -resolved with replacement  Chronic kidney disease, stage III -cannot specify A or B at this time -placed on free water   Goals of care -Patient appears to have very poor prognosis.   -Currently DNR. -Palliative care consulted and appreciated -Had long discussion with patient's daughter via phone.  Patient does not appear to be doing well with guarded prognosis.  Breathing appears to be tenuous, patient appears to be air hungry at this time.  Would like to move towards comfort however pending further decisions from family.  With daughter's permission, will give dose of morphine. -Palliative care met with wife at bedside, and transitioned patient to comfort care    Pertinent Labs and Studies  Significant Diagnostic Studies CT Code Stroke CTA Head W/WO contrast  Result Date: 03/18/2019 CLINICAL DATA:  86 year old male code stroke presentation with hyperdense right MCA on plain head CT. EXAM: CT ANGIOGRAPHY HEAD AND NECK TECHNIQUE: Multidetector CT imaging of the head and neck was performed using the standard protocol during bolus administration of intravenous contrast. Multiplanar CT image reconstructions and MIPs were obtained to evaluate the vascular anatomy. Carotid stenosis measurements (when applicable) are obtained utilizing NASCET criteria, using the distal internal carotid diameter as the denominator. CONTRAST:  77m OMNIPAQUE IOHEXOL 350 MG/ML SOLN COMPARISON:  Plain head CT 1306 hours today.  FINDINGS: CTA NECK Skeleton: Absent maxillary dentition. Advanced degenerative changes in the cervical spine. No acute osseous abnormality identified. Upper chest: Mild upper lung  atelectasis. No superior mediastinal lymphadenopathy. Other neck: Negative, no acute findings in the neck. Aortic arch: 3 vessel arch configuration. Mild to moderate arch atherosclerosis, with mostly soft plaque in the distal arch. Right carotid system: No brachiocephalic or right CCA origin stenosis despite some plaque. No stenosis proximal to the bifurcation, but there is bulky soft plaque or thrombus throughout the right ICA origin and bulb resulting in critical radiographic string sign stenosis on series 9, image 51. Superimposed additional bulky calcified plaque distal to the bulb also resulting in tandem string sign stenoses (series 5, image 105 and series 9 image 50). Despite this the vessel remains patent although is asymmetrically smaller to the skull base (about 3 mm diameter). Left carotid system: No left CCA origin stenosis despite plaque. At the left carotid bifurcation the left ICA origin is patent, but at the distal bulb there is bulky calcified more so than soft plaque resulting in severe stenosis at or approaching a radiographic string sign on series 5, image 103. The vessel remains patent and has a fairly normal caliber distal to this region to the skull base. Vertebral arteries: Calcified plaque in the proximal right subclavian artery with less than 50 % stenosis with respect to the distal vessel. Minor calcified plaque at the right vertebral artery origin without stenosis. However, there is bulky soft plaque in the right V1 segment resulting in moderate to severe stenosis on series 7, image 272. Distal to this the right vertebral caliber remains normal, and the vessel is then patent to the skull base without additional stenosis. Soft and calcified plaque plus tortuosity of the proximal left subclavian artery resulting in a kinked appearance and moderate stenosis as seen on series 9, image 109. Superimposed severe stenosis at the left vertebral artery origin due to soft plaque on series 8, image  133. The left vertebral is fairly codominant and then patent to the skull base without additional stenosis. CTA HEAD Posterior circulation: Non dominant left vertebral artery with mild calcified plaque does remain patent to the vertebrobasilar junction without stenosis. Moderate right V4 segment calcification with mild stenosis. Patent vertebrobasilar junction and basilar artery without stenosis. SCA and PCA origins are patent. Posterior communicating arteries are diminutive or absent. There is mild bilateral PCA irregularity and stenosis. Anterior circulation: Both ICA siphons are patent. On the left there is mild to moderate calcified plaque without stenosis. Normal left ophthalmic artery origin. On the right there is mild calcified plaque without stenosis. Normal right ophthalmic artery origin. Patent carotid termini. Patent bilateral MCA and left ACA origins. The left A1 is dominant and the right diminutive or absent. Anterior communicating artery and bilateral ACA branches are within normal limits. Left MCA M1 segment and bifurcation are patent without stenosis. Right MCA proximal M1 segment is patent but becomes near occluded more distally corresponding to the hyperdense segment by plain CT. The mid and distal right M1 segment is affected (series 11, image 20 and series 10, image 19) with intermediate to poor reconstitution of right M2 and M3 branches (series 12, image 9). Venous sinuses: Early contrast timing, grossly patent. Anatomic variants: Mildly dominant right vertebral artery. Review of the MIP images confirms the above findings IMPRESSION: 1. Positive for Right MCA Emergent Large Vessel Occlusion affecting the mid and distal M1 segment. 2. Positive also for superimposed Critical Stenosis cervical Right ICA beginning  at the origin, throughout the proximal 3.5 cm of the vessel. There are tandem RADIOGRAPHIC STRING SIGN STENOSES. 3. The above was discussed by telephone with to Dr. [name] at 1338 hours  on 03/27/2019. 4. Additional severe atherosclerosis and multifocal high-grade arterial stenoses elsewhere: - Left ICA distal bulb RADIOGRAPHIC STRING SIGN STENOSIS. - Severe bilateral Vertebral Artery origin stenoses. - proximal Left Subclavian Artery moderate stenosis due to plaque and tortuosity. 5.  Aortic Atherosclerosis (ICD10-I70.0). Electronically Signed   By: Genevie Ann M.D.   On: 03/19/2019 14:16   DG Abd 1 View  Result Date: 04/16/2019 CLINICAL DATA:  Feeding tube placement EXAM: ABDOMEN - 1 VIEW COMPARISON:  03/30/2019 FINDINGS: Feeding tube tip is in the distal stomach. Oral contrast material and decompressed colon. No bowel obstruction free air organomegaly. IMPRESSION: Feeding tube tip in the distal stomach. Electronically Signed   By: Rolm Baptise M.D.   On: 04/16/2019 09:20   DG Abd 1 View  Result Date: 03/30/2019 CLINICAL DATA:  Status post NG tube placement. EXAM: ABDOMEN - 1 VIEW COMPARISON:  None. FINDINGS: NG tube is in good position and looped in the stomach with the tip in the mid body. Feeding tube tip projects in the gastric antrum. IMPRESSION: As above. Electronically Signed   By: Inge Rise M.D.   On: 03/30/2019 18:40   CT HEAD WO CONTRAST  Result Date: 04/06/2019 CLINICAL DATA:  Patient continues to remain altered, evaluate for intracranial hemorrhage versus mass effect, stroke, follow-up. EXAM: CT HEAD WITHOUT CONTRAST TECHNIQUE: Contiguous axial images were obtained from the base of the skull through the vertex without intravenous contrast. COMPARISON:  Noncontrast head CT 03/30/2019, brain MRI 03/23/2019. FINDINGS: Brain: The examination is mildly motion degraded. A now subacute cortical/subcortical right temporal lobe infarct has not significantly changed in extent as compared to head CT 03/30/2019. No evidence of hemorrhagic conversion. No significant mass effect. No midline shift. Multiple known additional subacute right MCA territory infarcts were better appreciated on  MRI 03/23/2019. No new demarcated cortical infarct is identified. No evidence of intracranial mass. No extra-axial fluid collection. Stable background generalized parenchymal atrophy and chronic small vessel ischemic disease. Vascular: Atherosclerotic calcifications. No suspicious intracranial vascular hyperdensity. Skull: Normal. Negative for fracture or focal lesion. Sinuses/Orbits: Visualized orbits demonstrate no acute abnormality. Scattered mucosal thickening greatest within the inferior right maxillary sinus. Partially visualized nasoenteric tube. IMPRESSION: Mildly motion degraded exam. A subacute right temporal lobe ischemic infarct has not significantly changed in extent from head CT 03/30/2019. No significant associated mass effect. No hemorrhagic conversion. Additional small subacute right MCA infarcts were better appreciated on MRI 03/23/2019. No evidence of new acute intracranial abnormality. Electronically Signed   By: Kellie Simmering DO   On: 04/06/2019 19:59   CT HEAD WO CONTRAST  Result Date: 03/30/2019 CLINICAL DATA:  86 year old male with neurologic deficit. Positive right MCA emergent large vessel occlusion on 03/23/2019. Right temporal lobe and other scattered right hemisphere infarcts on MRI. EXAM: CT HEAD WITHOUT CONTRAST TECHNIQUE: Contiguous axial images were obtained from the base of the skull through the vertex without intravenous contrast. COMPARISON:  Brain MRI 03/23/2019 and earlier. FINDINGS: Brain: Confluent cytotoxic edema in the right temporal lobe. No acute intracranial hemorrhage identified. No intracranial mass effect or midline shift. No ventriculomegaly. Scattered small additional mostly cortical infarcts in the posterior right MCA territory by MRI are largely occult by CT. Stable gray-white matter differentiation in the left hemisphere and posterior fossa. Vascular: Calcified atherosclerosis at the skull base. No  suspicious intracranial vascular hyperdensity. Skull: No acute  osseous abnormality identified. Sinuses/Orbits: Right nasoenteric tube in place. Partially visible endotracheal tube. Fluid in the pharynx. New paranasal sinus mucosal thickening. New mastoid effusions, right tympanic cavity opacification. Other: No acute orbit or scalp soft tissue finding. IMPRESSION: 1. Expected appearance of the confluent right temporal lobe infarct with no associated hemorrhage or mass effect. Additional smaller right MCA infarcts demonstrated by MRI remain occult by CT. 2. No new intracranial abnormality. 3. Intubated and right nasoenteric tube in place with fluid/mucosal thickening of the paranasal sinuses, middle ears and mastoids. Electronically Signed   By: Genevie Ann M.D.   On: 03/30/2019 20:17   CT Code Stroke CTA Neck W/WO contrast  Result Date: 03/16/2019 CLINICAL DATA:  86 year old male code stroke presentation with hyperdense right MCA on plain head CT. EXAM: CT ANGIOGRAPHY HEAD AND NECK TECHNIQUE: Multidetector CT imaging of the head and neck was performed using the standard protocol during bolus administration of intravenous contrast. Multiplanar CT image reconstructions and MIPs were obtained to evaluate the vascular anatomy. Carotid stenosis measurements (when applicable) are obtained utilizing NASCET criteria, using the distal internal carotid diameter as the denominator. CONTRAST:  36m OMNIPAQUE IOHEXOL 350 MG/ML SOLN COMPARISON:  Plain head CT 1306 hours today. FINDINGS: CTA NECK Skeleton: Absent maxillary dentition. Advanced degenerative changes in the cervical spine. No acute osseous abnormality identified. Upper chest: Mild upper lung atelectasis. No superior mediastinal lymphadenopathy. Other neck: Negative, no acute findings in the neck. Aortic arch: 3 vessel arch configuration. Mild to moderate arch atherosclerosis, with mostly soft plaque in the distal arch. Right carotid system: No brachiocephalic or right CCA origin stenosis despite some plaque. No stenosis proximal  to the bifurcation, but there is bulky soft plaque or thrombus throughout the right ICA origin and bulb resulting in critical radiographic string sign stenosis on series 9, image 51. Superimposed additional bulky calcified plaque distal to the bulb also resulting in tandem string sign stenoses (series 5, image 105 and series 9 image 50). Despite this the vessel remains patent although is asymmetrically smaller to the skull base (about 3 mm diameter). Left carotid system: No left CCA origin stenosis despite plaque. At the left carotid bifurcation the left ICA origin is patent, but at the distal bulb there is bulky calcified more so than soft plaque resulting in severe stenosis at or approaching a radiographic string sign on series 5, image 103. The vessel remains patent and has a fairly normal caliber distal to this region to the skull base. Vertebral arteries: Calcified plaque in the proximal right subclavian artery with less than 50 % stenosis with respect to the distal vessel. Minor calcified plaque at the right vertebral artery origin without stenosis. However, there is bulky soft plaque in the right V1 segment resulting in moderate to severe stenosis on series 7, image 272. Distal to this the right vertebral caliber remains normal, and the vessel is then patent to the skull base without additional stenosis. Soft and calcified plaque plus tortuosity of the proximal left subclavian artery resulting in a kinked appearance and moderate stenosis as seen on series 9, image 109. Superimposed severe stenosis at the left vertebral artery origin due to soft plaque on series 8, image 133. The left vertebral is fairly codominant and then patent to the skull base without additional stenosis. CTA HEAD Posterior circulation: Non dominant left vertebral artery with mild calcified plaque does remain patent to the vertebrobasilar junction without stenosis. Moderate right V4 segment calcification with  mild stenosis. Patent  vertebrobasilar junction and basilar artery without stenosis. SCA and PCA origins are patent. Posterior communicating arteries are diminutive or absent. There is mild bilateral PCA irregularity and stenosis. Anterior circulation: Both ICA siphons are patent. On the left there is mild to moderate calcified plaque without stenosis. Normal left ophthalmic artery origin. On the right there is mild calcified plaque without stenosis. Normal right ophthalmic artery origin. Patent carotid termini. Patent bilateral MCA and left ACA origins. The left A1 is dominant and the right diminutive or absent. Anterior communicating artery and bilateral ACA branches are within normal limits. Left MCA M1 segment and bifurcation are patent without stenosis. Right MCA proximal M1 segment is patent but becomes near occluded more distally corresponding to the hyperdense segment by plain CT. The mid and distal right M1 segment is affected (series 11, image 20 and series 10, image 19) with intermediate to poor reconstitution of right M2 and M3 branches (series 12, image 9). Venous sinuses: Early contrast timing, grossly patent. Anatomic variants: Mildly dominant right vertebral artery. Review of the MIP images confirms the above findings IMPRESSION: 1. Positive for Right MCA Emergent Large Vessel Occlusion affecting the mid and distal M1 segment. 2. Positive also for superimposed Critical Stenosis cervical Right ICA beginning at the origin, throughout the proximal 3.5 cm of the vessel. There are tandem RADIOGRAPHIC STRING SIGN STENOSES. 3. The above was discussed by telephone with to Dr. [name] at 1338 hours on 03/23/2019. 4. Additional severe atherosclerosis and multifocal high-grade arterial stenoses elsewhere: - Left ICA distal bulb RADIOGRAPHIC STRING SIGN STENOSIS. - Severe bilateral Vertebral Artery origin stenoses. - proximal Left Subclavian Artery moderate stenosis due to plaque and tortuosity. 5.  Aortic Atherosclerosis (ICD10-I70.0).  Electronically Signed   By: Genevie Ann M.D.   On: 04/06/2019 14:16   MR BRAIN WO CONTRAST  Result Date: 03/23/2019 CLINICAL DATA:  Stroke, follow-up. EXAM: MRI HEAD WITHOUT CONTRAST TECHNIQUE: Multiplanar, multiecho pulse sequences of the brain and surrounding structures were obtained without intravenous contrast. COMPARISON:  Noncontrast CT head, CT angiogram head/neck and CT perfusion 04/06/2019 FINDINGS: Brain: There is an acute cortical/subcortical infarct within the anterolateral right temporal lobe measuring 3.1 x 1.5 cm in transaxial dimensions (series 5, image 67). There are numerous additional small scattered cortical and subcortical infarcts within the right cerebral hemisphere MCA vascular territory involving the right frontal lobe, parietal lobe, occipital lobe and temporal lobe as well as right insula. There is no significant mass effect. No midline shift or extra-axial fluid collection. No chronic intracranial blood products. Moderate patchy T2/FLAIR hyperintensity within the cerebral white matter is nonspecific, but consistent with chronic small vessel ischemic disease. Moderate generalized parenchymal atrophy. Vascular: SWI signal loss in the region of proximal right M2 MCA branches (series 12, image 23) which may reflect residual thrombus. Skull and upper cervical spine: No focal marrow lesion. Incompletely assessed upper cervical spondylosis. Sinuses/Orbits: Visualized orbits demonstrate no acute abnormality. Paranasal sinus mucosal thickening. Most notably there is moderate mucosal thickening within bilateral ethmoid air cells. Left sphenoid sinus air-fluid level. Small bilateral mastoid effusions. IMPRESSION: 3.1 cm acute cortical/subcortical infarct within the anterolateral right temporal lobe. Numerous additional small scattered acute cortical and subcortical infarct within the right cerebral hemisphere MCA vascular territory. No significant mass effect or evidence of parenchymal hemorrhage.  SWI signal loss in the region of proximal right M2 MCA branches which may reflect residual thrombus. Moderate generalized parenchymal atrophy and chronic small vessel ischemic disease. Paranasal sinus disease as described. Small  bilateral mastoid effusions. Electronically Signed   By: Kellie Simmering DO   On: 03/23/2019 13:43   IR INTRAVSC STENT CERV CAROTID W/O EMB-PROT MOD SED  Result Date: 03/20/2019 INDICATION: New onset of slurred speech and left-sided weakness. Left-sided neglect. CT angiogram of the head and neck revealing a pre occlusive severe stenosis of the right internal carotid proximally with angiographic string sign.  Large non occlusive filling defect in the right middle cerebral artery distally extending into the inferior division proximally.  EXAM: 1. EMERGENT LARGE VESSEL OCCLUSION THROMBOLYSIS (anterior CIRCULATION)  COMPARISON:  CT angiogram of the head and neck of March 22, 2019.  MEDICATIONS: Ancef 2 g IV antibiotic was administered within 1 hour of the procedure.  ANESTHESIA/SEDATION: General anesthesia.  CONTRAST:  Omnipaque 300 approximately 120 mL.  FLUOROSCOPY TIME:  Fluoroscopy Time: 76 minutes 48 seconds (1992 mGy).  COMPLICATIONS: None immediate.  TECHNIQUE: Following a full explanation of the procedure along with the potential associated complications, an informed witnessed consent was obtained from the wife. The risks of intracranial hemorrhage of 10%, worsening neurological deficit, ventilator dependency, death and inability to revascularize were all reviewed in detail with the patient's spouse.  The patient was then put under general anesthesia by the Department of Anesthesiology at [facility].  The right groin was prepped and draped in the usual sterile fashion. Thereafter using modified Seldinger technique, transfemoral access into the right common femoral artery was obtained without difficulty. Over a 0.035 inch guidewire a 5 French Pinnacle sheath was  inserted. Through this, and also over a 0.035 inch guidewire a 5 [place] JB 1 catheter was advanced to the aortic arch region and selectively positioned in the innominate artery and the right common carotid artery.  FINDINGS: The innominate artery angiogram demonstrates the origin of the right subclavian artery and the right common carotid artery to be widely patent.  There is mild narrowing of the origin of the right vertebral artery.  The vessel is, otherwise, seen to opacify to the cranial skull base to the level of the right posterior-inferior cerebellar artery.  The right common carotid arteriogram demonstrates the origin the right external carotid artery and its major branches to be widely patent.  The right internal carotid artery at the bulb and just distally demonstrates severe pre occlusive stenosis with an angiographic string sign.  No focal area of severe stenosis also noted in the right internal carotid artery proximally. More distally there is decreased caliber of the right internal carotid artery to the cranial skull base. The left common carotid arteriogram demonstrates the left external carotid artery and its major branches to be widely patent.  The right internal carotid artery at the bulb demonstrates a high-grade stenosis of approximately 75% in the lateral projection associated with a segmental atherosclerotic plaque.  More distally the vessel is seen to opacify to the cranial skull base. The petrous, cavernous and the supraclinoid left ICA demonstrates wide patency.  The left anterior and the left middle cerebral artery opacify into the capillary and venous phases. There is mild to moderate narrowing of the left anterior cerebral A1 segment.  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  Delayed arterial and capillary phases demonstrate opacification of the right anterior cerebral artery territory distal to the A2 segment.  PROCEDURE: The diagnostic  JB 1 catheter in the right common carotid artery was then exchanged over a 0.035 inch 300 cm Rosen exchange guidewire for an 8 [place] Pinnacle sheath in the right groin which was then connected to continuous heparinized saline infusion. The patient was loaded with aspirin 81 mg, and Brilinta 180 mg via an orogastric tube in anticipation for revascularization of the right internal carotid artery proximally.  Over the exchange guidewire, an 087 balloon guide catheter which had been prepped with 50% contrast and 50% heparinized saline infusion was advanced and positioned just proximal to the right common carotid bifurcation. The guidewire was removed. Good aspiration was obtained from the hub of the balloon guide catheter. A gentle control arteriogram performed through this continued to demonstrate no change in the severely pre occlusive narrowing of the right internal carotid artery at the bulb, and also in the proximal right internal carotid artery.  Over a 0.014 inch standard Synchro micro guidewire, an 021 Trevo ProVue microcatheter was advanced to the distal end of the balloon guide catheter. The micro guidewire was then gently manipulated using a torque device and advanced without difficulty to the cervical petrous junction. However, advancement of the 021 Trevo ProVue micro catheter was met with significant resistance at the upper tandem calcified lesion.  This was then replaced instead with a 017 Headway 2 tip microcatheter which was again advanced over a 0.014 inch standard Synchro micro guidewire without difficulty to the cervical petrous junction.  The micro guidewire was removed. Good aspiration obtained from the hub of the microcatheter. This in turn was then exchanged for an 014 inch softip Transend 300 cm exchange micro  guidewire with a J configuration.  Control arteriogram performed through the balloon guide catheter demonstrated improved flow through the right internal carotid artery proximally and distally. A 4 mm x 30 mm Viatrac 14 balloon angioplasty catheter was then prepped and purged with heparinized saline infusion.  Using the rapid exchange technique this was then advanced and positioned without difficulty across the proximal right ICA stenosis.  Control inflation was then carried out using micro inflation syringe device via micro tubing to the normal pressure where it was maintained for approximately 20 seconds. At this time, there was significant bradycardia noted. This prompted immediate deflation and retrieval of the balloon whilst the exchange micro guidewire was kept and positioned in the petrous segment of the right internal carotid artery.  A control arteriogram performed through the balloon guide catheter in the right common carotid artery now demonstrated significantly improved caliber and flow through the proximally angioplastied segment of the right internal carotid artery now revealing the significant stenosis distal to this involving the calcified plaque.  More distally, free flow was seen in the right internal carotid artery to the cranial skull base in the petrous cavernous and supraclinoid segments. The right middle cerebral artery distribution was noted to be widely patent with no evidence of filling defect in the right middle cerebral artery over its distal territories.  A TICI 3 revascularization had been achieved.  There was no visualization of the right anterior cerebral artery A1 segment. This was also noted to be on the CT angiogram of the head and neck performed earlier.  Measurements were then performed of the right internal carotid artery in the most normal appearance angiographically distal to the proximal angioplasty segment, and also the right common carotid artery. It was decided to  proceed with placement of a 6/8 mm x 40 mm Xact stent.  The delivery catheter of  the stent was then prepped and purged with heparinized saline infusion retrogradely.  Again using the rapid exchange technique, this was then advanced without difficulty to the right common carotid bifurcation.  The distal portion of the stent was positioned such that it was proximal to the more tandem heavily calcified lesion.  This stent was then deployed without difficulty. The delivery system was then retrieved and removed.  A control arteriogram performed through the balloon guide catheter in the right common carotid artery now demonstrates significantly improved caliber and flow through the stented segment and also through the internal carotid artery proximally and distally.  There continued to be the calcified significant stenosis involving the proximal right ICA as mentioned earlier.  Measurements were performed of the right internal carotid artery in the C1-C2 junction, and also aspirate was the noted in the right internal carotid artery at the proximal aspect of the initial stent.  Over the exchange micro guidewire, a 4 mm x 22 mm Resolute Onyx drug-eluting balloon mounted stent was prepped and purged with heparinized saline infusion and also with 50% contrast and 50% heparinized saline infusion.  This was then advanced again using the rapid exchange technique without difficulty into the distal portion of the initial stent. However, this stent could not be advanced into the C1-C2 junction on account of the severe calcific plaque.  This is most likely related to calcified ledge protruding intraluminally.  After multiple attempts, the balloon mounted stent delivery system was retrieved and removed.  It was, therefore, decided to proceed with balloon angioplasty. A TREK 3 mm x 12 mm coronary angioplasty balloon catheter was prepped and purged with heparinized saline infusion. Using the rapid exchange technique, this  was advanced without difficulty and positioned in the distal and proximal markers at the site of the severe high-grade stenosis. A control angioplasty was then performed again with a micro inflation syringe device via micro tubing. This was carried to 6 atmospheres where it was maintained for approximately 90 seconds. Upon deflation and proximal retrieval, a control arteriogram performed through the balloon guide catheter in the right common carotid artery now demonstrated significantly improved caliber and flow through the angioplastied segment also distally.  It was elected to try advancement of the previously Resolute Onyx 4 x 22 mm balloon mounted stent in view of the improved caliber following the angioplasty.  At this time the stent delivery catheter was advanced more distally. However, further advancement was again met middle with significant resistance secondary to calcified plaque.  Again after multiple trials, this was abandoned.  Control arteriogram performed through the balloon guide catheter in the right common carotid artery continued to demonstrate significantly improved caliber and flow through the angioplastied segment of the right internal carotid artery.  There was approximately 80% patency noted at the site of the angioplasty.  10 minute control arteriogram performed through the balloon guide catheter continued to demonstrate significantly improved caliber and flow through the angioplastied segment.  A control arteriogram performed intracranially continued to demonstrate a completely revascularized right middle cerebral artery distribution. An anterior cerebral artery A1 segment again was visualized.  The balloon guide was then retrieved and removed. The 8 French sheath was left in place and connected to continuous heparinized saline infusion. No evidence of hematoma or bleeding was seen. The distal pulses remained Dopplerable in the dorsalis pedis, and the posterior tibial regions  bilaterally unchanged.  The patient was also left intubated on account of the IV tPA the patient had had, in the  large-bore Pinnacle sheath.  The patient was also given approximately 7.5 mg of Integrilin during the course of the treatment involving the right ICA angioplasty tandem lesion. Additionally it was decided to infuse Aggrastat over approximately 12 hours in order to obviate the potential for acute platelet aggregation at the site of the angioplasty, and also the stent.  A flat panel CT of the brain demonstrated no evidence of gross midline shift or of hemorrhage.  The patient was then transferred to the neuro ICU to continue with post revascularization management.  IMPRESSION: Complete revascularization of previously noted large filling defect in the right middle cerebral artery distally extending into the proximal inferior division following proximal revascularization, and use of approximately 7.5 mg with a TICI 3 revascularization of the right middle cerebral artery distribution.  Status post endovascular revascularization of symptomatic near complete occlusion of the right internal carotid artery proximally with a string sign with placement of stent following balloon angioplasty as described.  Status post endovascular balloon angioplasty of significant stenosis secondary to a calcified atherosclerotic plaque involving the proximal and middle 1/3 of the right internal carotid artery with patency of 80%.  Non visualization of the right anterior cerebral artery A1 segment. Opacification of the right anterior cerebral artery at the level of the A2 segment from a communication with the left anterior cerebral artery A2 segment. 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  PLAN: Follow-up in the clinic 4 weeks post discharge.   Electronically Signed   By: Luanne Bras M.D.   On: 03/23/2019 15:13  IR CT [facility]  Result Date:  03/24/2019 INDICATION: New onset of slurred speech and left-sided weakness. Left-sided neglect. CT angiogram of the head and neck revealing a pre occlusive severe stenosis of the right internal carotid proximally with angiographic string sign. Large non occlusive filling defect in the right middle cerebral artery distally extending into the inferior division proximally. EXAM: 1. EMERGENT LARGE VESSEL OCCLUSION THROMBOLYSIS (anterior CIRCULATION) COMPARISON:  CT angiogram of the head and neck of March 22, 2019. MEDICATIONS: Ancef 2 g IV antibiotic was administered within 1 hour of the procedure. ANESTHESIA/SEDATION: General anesthesia. CONTRAST:  Omnipaque 300 approximately 120 mL. FLUOROSCOPY TIME:  Fluoroscopy Time: 76 minutes 48 seconds (1992 mGy). COMPLICATIONS: None immediate. TECHNIQUE: Following a full explanation of the procedure along with the potential associated complications, an informed witnessed consent was obtained from the wife. The risks of intracranial hemorrhage of 10%, worsening neurological deficit, ventilator dependency, death and inability to revascularize were all reviewed in detail with the patient's spouse. The patient was then put under general anesthesia by the Department of Anesthesiology at [facility]. The right groin was prepped and draped in the usual sterile fashion. Thereafter using modified Seldinger technique, transfemoral access into the right common femoral artery was obtained without difficulty. Over a 0.035 inch guidewire a 5 French Pinnacle sheath was inserted. Through this, and also over a 0.035 inch guidewire a 5 [place] JB 1 catheter was advanced to the aortic arch region and selectively positioned in the innominate artery and the right common carotid artery. FINDINGS: The innominate artery angiogram demonstrates the origin of the right subclavian artery and the right common carotid artery to be widely patent. There is mild narrowing of the origin of the right  vertebral artery. The vessel is, otherwise, seen to opacify to the cranial skull base to the level of the right posterior-inferior cerebellar artery. The right common  carotid arteriogram demonstrates the origin the right external carotid artery and its major branches to be widely patent. The right internal carotid artery at the bulb and just distally demonstrates severe pre occlusive stenosis with an angiographic string sign. No focal area of severe stenosis also noted in the right internal carotid artery proximally. More distally there is decreased caliber of the right internal carotid artery to the cranial skull base. The left common carotid arteriogram demonstrates the left external carotid artery and its major branches to be widely patent. The right internal carotid artery at the bulb demonstrates a high-grade stenosis of approximately 75% in the lateral projection associated with a segmental atherosclerotic plaque. More distally the vessel is seen to opacify to the cranial skull base. The petrous, cavernous and the supraclinoid left ICA demonstrates wide patency. The left anterior and the left middle cerebral artery opacify into the capillary and venous phases. There is mild to moderate narrowing of the left anterior cerebral A1 segment. 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 Delayed arterial and capillary phases demonstrate opacification of the right anterior cerebral artery territory distal to the A2 segment. PROCEDURE: The diagnostic JB 1 catheter in the right common carotid artery was then exchanged over a 0.035 inch 300 cm Rosen exchange guidewire for an 8 [place] Pinnacle sheath in the right groin which was then connected to continuous heparinized saline infusion. The patient was loaded  with aspirin 81 mg, and Brilinta 180 mg via an orogastric tube in anticipation for revascularization of the right internal carotid artery proximally. Over the exchange guidewire, an 087 balloon guide catheter which had been prepped with 50% contrast and 50% heparinized saline infusion was advanced and positioned just proximal to the right common carotid bifurcation. The guidewire was removed. Good aspiration was obtained from the hub of the balloon guide catheter. A gentle control arteriogram performed through this continued to demonstrate no change in the severely pre occlusive narrowing of the right internal carotid artery at the bulb, and also in the proximal right internal carotid artery. Over a 0.014 inch standard Synchro micro guidewire, an 021 Trevo ProVue microcatheter was advanced to the distal end of the balloon guide catheter. The micro guidewire was then gently manipulated using a torque device and advanced without difficulty to the cervical petrous junction. However, advancement of the 021 Trevo ProVue micro catheter was met with significant resistance at the upper tandem calcified lesion. This was then replaced instead with a 017 Headway 2 tip microcatheter which was again advanced over a 0.014 inch standard Synchro micro guidewire without difficulty to the cervical petrous junction. The micro guidewire was removed. Good aspiration obtained from the hub of the microcatheter. This in turn was then exchanged for an 014 inch softip Transend 300 cm exchange micro guidewire with a J configuration. Control arteriogram performed through the balloon guide catheter demonstrated improved flow through the right internal carotid artery proximally and distally. A 4 mm x 30 mm Viatrac 14 balloon angioplasty catheter was then prepped and purged with heparinized saline infusion. Using the rapid exchange technique this was then advanced and positioned without difficulty across the proximal right ICA stenosis. Control  inflation was then carried out using micro inflation syringe device via micro tubing to the normal pressure where it was maintained for approximately 20 seconds. At  this time, there was significant bradycardia noted. This prompted immediate deflation and retrieval of the balloon whilst the exchange micro guidewire was kept and positioned in the petrous segment of the right internal carotid artery. A control arteriogram performed through the balloon guide catheter in the right common carotid artery now demonstrated significantly improved caliber and flow through the proximally angioplastied segment of the right internal carotid artery now revealing the significant stenosis distal to this involving the calcified plaque. More distally, free flow was seen in the right internal carotid artery to the cranial skull base in the petrous cavernous and supraclinoid segments. The right middle cerebral artery distribution was noted to be widely patent with no evidence of filling defect in the right middle cerebral artery over its distal territories. A TICI 3 revascularization had been achieved. There was no visualization of the right anterior cerebral artery A1 segment. This was also noted to be on the CT angiogram of the head and neck performed earlier. Measurements were then performed of the right internal carotid artery in the most normal appearance angiographically distal to the proximal angioplasty segment, and also the right common carotid artery. It was decided to proceed with placement of a 6/8 mm x 40 mm Xact stent. The delivery catheter of the stent was then prepped and purged with heparinized saline infusion retrogradely. Again using the rapid exchange technique, this was then advanced without difficulty to the right common carotid bifurcation. The distal portion of the stent was positioned such that it was proximal to the more tandem heavily calcified lesion. This stent was then deployed without difficulty. The  delivery system was then retrieved and removed. A control arteriogram performed through the balloon guide catheter in the right common carotid artery now demonstrates significantly improved caliber and flow through the stented segment and also through the internal carotid artery proximally and distally. There continued to be the calcified significant stenosis involving the proximal right ICA as mentioned earlier. Measurements were performed of the right internal carotid artery in the C1-C2 junction, and also aspirate was the noted in the right internal carotid artery at the proximal aspect of the initial stent. Over the exchange micro guidewire, a 4 mm x 22 mm Resolute Onyx drug-eluting balloon mounted stent was prepped and purged with heparinized saline infusion and also with 50% contrast and 50% heparinized saline infusion. This was then advanced again using the rapid exchange technique without difficulty into the distal portion of the initial stent. However, this stent could not be advanced into the C1-C2 junction on account of the severe calcific plaque. This is most likely related to calcified ledge protruding intraluminally. After multiple attempts, the balloon mounted stent delivery system was retrieved and removed. It was, therefore, decided to proceed with balloon angioplasty. A TREK 3 mm x 12 mm coronary angioplasty balloon catheter was prepped and purged with heparinized saline infusion. Using the rapid exchange technique, this was advanced without difficulty and positioned in the distal and proximal markers at the site of the severe high-grade stenosis. A control angioplasty was then performed again with a micro inflation syringe device via micro tubing. This was carried to 6 atmospheres where it was maintained for approximately 90 seconds. Upon deflation and proximal retrieval, a control arteriogram performed through the balloon guide catheter in the right common carotid artery now demonstrated  significantly improved caliber and flow through the angioplastied segment also distally. It was elected to try advancement of the previously Resolute Onyx 4 x 22 mm balloon mounted stent  in view of the improved caliber following the angioplasty. At this time the stent delivery catheter was advanced more distally. However, further advancement was again met middle with significant resistance secondary to calcified plaque. Again after multiple trials, this was abandoned. Control arteriogram performed through the balloon guide catheter in the right common carotid artery continued to demonstrate significantly improved caliber and flow through the angioplastied segment of the right internal carotid artery. There was approximately 80% patency noted at the site of the angioplasty. 10 minute control arteriogram performed through the balloon guide catheter continued to demonstrate significantly improved caliber and flow through the angioplastied segment. A control arteriogram performed intracranially continued to demonstrate a completely revascularized right middle cerebral artery distribution. An anterior cerebral artery A1 segment again was visualized. The balloon guide was then retrieved and removed. The 8 French sheath was left in place and connected to continuous heparinized saline infusion. No evidence of hematoma or bleeding was seen. The distal pulses remained Dopplerable in the dorsalis pedis, and the posterior tibial regions bilaterally unchanged. The patient was also left intubated on account of the IV tPA the patient had had, in the large-bore Pinnacle sheath. The patient was also given approximately 7.5 mg of Integrilin during the course of the treatment involving the right ICA angioplasty tandem lesion. Additionally it was decided to infuse Aggrastat over approximately 12 hours in order to obviate the potential for acute platelet aggregation at the site of the angioplasty, and also the stent. A flat panel CT of  the brain demonstrated no evidence of gross midline shift or of hemorrhage. The patient was then transferred to the neuro ICU to continue with post revascularization management. IMPRESSION: Complete revascularization of previously noted large filling defect in the right middle cerebral artery distally extending into the proximal inferior division following proximal revascularization, and use of approximately 7.5 mg with a TICI 3 revascularization of the right middle cerebral artery distribution. Status post endovascular revascularization of symptomatic near complete occlusion of the right internal carotid artery proximally with a string sign with placement of stent following balloon angioplasty as described. Status post endovascular balloon angioplasty of significant stenosis secondary to a calcified atherosclerotic plaque involving the proximal and middle 1/3 of the right internal carotid artery with patency of 80%. Non visualization of the right anterior cerebral artery A1 segment. Opacification of the right anterior cerebral artery at the level of the A2 segment from a communication with the left anterior cerebral artery A2 segment. 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 PLAN: Follow-up in the clinic 4 weeks post discharge. Electronically Signed   By: Luanne Bras M.D.   On: 03/23/2019 15:13   DG CHEST PORT 1 VIEW  Result Date: 04/17/2019 CLINICAL DATA:  Shortness of breath EXAM: PORTABLE CHEST 1 VIEW COMPARISON:  04/16/2019 FINDINGS: Cardiomegaly. Airspace disease in the right perihilar and lower lobe region has increased since prior study. Mild left basilar opacity. No effusions or acute bony abnormality. IMPRESSION: Bilateral airspace disease, worsening in the right perihilar and infrahilar regions concerning for pneumonia. Electronically Signed   By: Rolm Baptise M.D.   On: 04/17/2019 10:38   DG Chest Port 1 View  Result Date:  04/16/2019 CLINICAL DATA:  86 year old male with history of acute respiratory distress. Confusion. EXAM: PORTABLE CHEST 1 VIEW COMPARISON:  Chest x-ray 04/05/2019. FINDINGS: A feeding tube is seen extending into the abdomen, however, the tip of the feeding tube extends below the lower  margin of the image. Lung volumes are slightly low. Diffuse peribronchial cuffing. No consolidative airspace disease. No pleural effusions. No pneumothorax. No pulmonary nodule or mass noted. Pulmonary vasculature and the cardiomediastinal silhouette are within normal limits. IMPRESSION: 1. The appearance of the lungs is concerning for bronchitis. Electronically Signed   By: Vinnie Langton M.D.   On: 04/16/2019 09:29   DG CHEST PORT 1 VIEW  Result Date: 04/06/2019 CLINICAL DATA:  Short of breath, recent stroke EXAM: PORTABLE CHEST 1 VIEW COMPARISON:  04/04/2019 FINDINGS: Single frontal view of the chest demonstrates enteric catheter passing below diaphragm tip excluded by collimation. The cardiac silhouette is enlarged but stable. Chronic central vascular congestion is again noted. Patchy areas of consolidation within the right upper and right lower lung zones are again noted, favor edema given the waxing and waning appearance since prior studies. No effusion or pneumothorax. IMPRESSION: 1. Slight worsening volume status, with right perihilar airspace disease superimposed upon chronic central vascular congestion. Electronically Signed   By: Randa Ngo M.D.   On: 04/06/2019 00:22   DG Chest Port 1 View  Result Date: 04/04/2019 CLINICAL DATA:  Wheezing EXAM: PORTABLE CHEST 1 VIEW COMPARISON:  Chest radiograph 04/03/2019 FINDINGS: Enteric tube courses below the field of view. Moderate cardiomegaly and central pulmonary vascular congestion. No pleural effusion or pneumothorax. No focal consolidation. IMPRESSION: Cardiomegaly and central pulmonary vascular congestion. Electronically Signed   By: Ulyses Jarred M.D.   On:  04/04/2019 15:13   DG Chest Port 1 View  Result Date: 04/03/2019 CLINICAL DATA:  Respiratory failure.  Cough. EXAM: PORTABLE CHEST 1 VIEW COMPARISON:  04/02/2019.  04/01/2019. FINDINGS: Feeding tube noted with tip below left hemidiaphragm. Stable cardiomegaly. Persistent right upper and right lower infiltrates are again noted without interim change. Left base infiltrate unchanged. No pleural effusion or pneumothorax. IMPRESSION: 1.  Feeding tube in stable position. 2.  Stable cardiomegaly. 3. Persistent right upper and right lower infiltrates. Persistent left base infiltrate. No interim change. Electronically Signed   By: Marcello Moores  Register   On: 04/03/2019 06:52   DG Chest Port 1 View  Result Date: 04/02/2019 CLINICAL DATA:  86 year old who presented with a stroke on 03/30/2019 for which thrombolysis of the RIGHT middle cerebral artery was performed. Extubation. EXAM: PORTABLE CHEST 1 VIEW COMPARISON:  04/01/2019 and earlier. FINDINGS: Interval extubation since yesterday. Persistent patchy airspace opacities in the RIGHT perihilar location, unchanged. Focal consolidation in the RIGHT upper lobe, unchanged. Mildly prominent bronchovascular markings diffusely and mild central peribronchial thickening, unchanged. No new pulmonary parenchymal abnormalities. No visible pleural effusions. Feeding tube courses below the diaphragm into the stomach though its tip is not included on the image. IMPRESSION: 1. Stable pneumonia involving the RIGHT upper lobe and the RIGHT perihilar location. 2. No new abnormalities. Electronically Signed   By: Evangeline Dakin M.D.   On: 04/02/2019 08:45   DG Chest Port 1 View  Result Date: 04/01/2019 CLINICAL DATA:  Respiratory failure EXAM: PORTABLE CHEST 1 VIEW COMPARISON:  Chest radiograph from the prior day. FINDINGS: An endotracheal tube terminates in the midthoracic trachea. Two enteric tubes enter the stomach, 1 terminates below the field of view and the other terminates in  the stomach. The heart remains enlarged. There is unchanged mild left basilar atelectasis/airspace disease. Right basilar atelectasis/airspace disease has decreased. There is no pleural effusion or pneumothorax. IMPRESSION: 1. Stable mild left basilar atelectasis/airspace disease. 2. Right basilar atelectasis/airspace disease has decreased. Electronically Signed   By: Harley Hallmark.D.  On: 04/01/2019 08:24   Portable Chest xray  Result Date: 03/31/2019 CLINICAL DATA:  Respiratory failure EXAM: PORTABLE CHEST 1 VIEW COMPARISON:  03/30/2019 FINDINGS: Endotracheal tube, feeding catheter and gastric catheter are noted in stable position. Cardiac shadow is enlarged but stable. Increasing right basilar opacity is noted with small effusion. The left lung remains clear. IMPRESSION: Increasing right basilar opacity with associated effusion. Electronically Signed   By: Inez Catalina M.D.   On: 03/31/2019 08:23   Portable Chest x-ray  Result Date: 03/30/2019 CLINICAL DATA:  Status post intubation. EXAM: PORTABLE CHEST 1 VIEW COMPARISON:  Single-view of the chest 03/29/2019. FINDINGS: Endotracheal tube is now in place with the tip in good position just below the clavicular heads. NG tube and feeding tube course below the inferior margin of the film. Hazy airspace disease is seen throughout the right chest. The left lung appears clear. The patient is markedly rotated on the study. IMPRESSION: ETT in good position. Airspace disease in the right chest most worrisome for pneumonia. Electronically Signed   By: Inge Rise M.D.   On: 03/30/2019 18:39   DG Chest Port 1 View  Result Date: 03/29/2019 CLINICAL DATA:  Acute respiratory distress EXAM: PORTABLE CHEST 1 VIEW COMPARISON:  03/26/2019 FINDINGS: Cardiac shadow remains enlarged. Endotracheal tube has been removed. Feeding catheter is noted extending into the stomach. The right-sided PICC line is been removed as well. Patchy opacities are noted within both lungs  worse on the right than the left consistent with multifocal pneumonia. No sizable effusion is noted. IMPRESSION: Changes of multifocal infiltrate. Electronically Signed   By: Inez Catalina M.D.   On: 03/29/2019 09:44   DG Chest Port 1 View  Result Date: 03/26/2019 CLINICAL DATA:  Hypoxia EXAM: PORTABLE CHEST 1 VIEW COMPARISON:  Two days ago FINDINGS: Endotracheal tube tip is halfway between the clavicular heads and carina. Interval right PICC with tip at the upper right atrium. The enteric tube at least reaches the stomach. Dense opacity at the bases with worsening aeration along the diaphragm which is no longer distinct. The hazy density, especially on the right may reflect dependent pleural fluid. No pneumothorax. Cardiomegaly. IMPRESSION: 1. Unremarkable hardware positioning. 2. Worsening opacification which is likely both pulmonary opacity and pleural fluid. Electronically Signed   By: Monte Fantasia M.D.   On: 03/26/2019 07:47   DG Chest Port 1 View  Result Date: 03/24/2019 CLINICAL DATA:  ET tube placed EXAM: PORTABLE CHEST 1 VIEW COMPARISON:  03/30/2019 FINDINGS: Endotracheal tube with the tip 3.6 cm above the carina. Nasogastric tube coursing below the diaphragm projecting over the stomach. Mild right basilar airspace disease. No pleural effusion or pneumothorax. Stable cardiomediastinal silhouette. No aggressive osseous lesion. IMPRESSION: ET tube with the tip 3.6 cm above the carina. Right lower lobe hazy airspace disease which may reflect atelectasis versus pneumonia. Electronically Signed   By: Kathreen Devoid   On: 03/24/2019 08:47   DG Chest Port 1 View  Result Date: 03/29/2019 CLINICAL DATA:  Respiratory failure. EXAM: PORTABLE CHEST 1 VIEW COMPARISON:  October 09, 2013 FINDINGS: An endotracheal tube is seen with its distal tip approximately 3.7 cm from the carina. A nasogastric tube is noted with its distal end overlying the body of the stomach. Very mild atelectasis and/or infiltrate is seen  along the medial aspect of the left lung base. There is no evidence of a pleural effusion or pneumothorax. There is moderate to marked severity enlargement of the cardiac silhouette. The visualized skeletal structures are  unremarkable. IMPRESSION: 1. Endotracheal tube and nasogastric tube in good position. 2. Moderate to marked severity enlargement of the cardiac silhouette. 3. Mild left basilar atelectasis and/or infiltrate. Electronically Signed   By: Virgina Norfolk M.D.   On: 04/08/2019 19:05   DG Swallowing Func-Speech Pathology  Result Date: 04/14/2019 Objective Swallowing Evaluation: Type of Study: MBS-Modified Barium Swallow Study  Patient Details Name: Jonathan Berger MRN: [id number] Date of Birth: [birth year] Today's Date: 04/14/2019 Time: SLP Start Time (ACUTE ONLY): 1318 -SLP Stop Time (ACUTE ONLY): 1342 SLP Time Calculation (min) (ACUTE ONLY): 24 min Past Medical History: Past Medical History: Diagnosis Date . Anemia  . Chronic back pain   herniated disc . Hepatitis   50+yrs ago . Hypertension   takes Amlodipine/Valsartan/HCTZ daily . Personal history of kidney stones  Past Surgical History: Past Surgical History: Procedure Laterality Date . BACK SURGERY  1977/006 . COLONOSCOPY   . CYSTOSCOPY   . ESOPHAGOGASTRODUODENOSCOPY   . IR ANGIO INTRA EXTRACRAN SEL COM CAROTID INNOMINATE UNI L MOD SED  04/06/2019 . IR ANGIO VERTEBRAL SEL SUBCLAVIAN INNOMINATE UNI L MOD SED  03/28/2019 . IR CT HEAD LTD  03/25/2019 . IR ENDOVASC INTRACRANIAL INF OTHER THAN THROMBO ART INC DIAG ANGIO  04/02/2019 . IR INTRAVSC STENT CERV CAROTID W/O EMB-PROT MOD SED INC ANGIO  03/18/2019 . LITHOTRIPSY   . LUMBAR LAMINECTOMY  01/29/2011  Procedure: MICRODISCECTOMY LUMBAR LAMINECTOMY;  Surgeon: Marybelle Killings;  Location: [facility];  Service: Orthopedics;  Laterality: Right;  Right L4-5 Microdiscectomy . RADIOLOGY WITH ANESTHESIA N/A 04/08/2019  Procedure: RADIOLOGY WITH ANESTHESIA;  Surgeon: Luanne Bras, MD;  Location: [facility];  Service:  Radiology;  Laterality: N/A; HPI: Pt is an 86 year old male presenting 2/7 with slurred speech and dysphagia, found to have acute R MCA stroke s/p tPA and EVR R MCA and ICA with stent. ETT 2/7-2/11. PMH: HTN. Intubated 2/7-2/11.  Subjective: alert, confused Assessment / Plan / Recommendation CHL IP CLINICAL IMPRESSIONS 04/14/2019 Clinical Impression Pt's daughter, Marlowe Kays present during [place]. Pt was calm, mostly cooperative and responded to encouragement from daughter to continue accepting barium trials. Oral phase marked by lingual rocking, decreased cohesion, delayed propulsion, piecemeal swallowing with anterior spill and lingual residue. Laryngeal vestibule penetration iwth nectar which mostly exited vestibule during the swallow. Timing of laryngeal closure was delayed and thin barium fell into airway and was aspirated from vallecular and pyriform sinus residue with delayed cough. Reduced tongue base retraction led to mild-moderate vallecular and pyriform sinus residue increasing risk for aspiration. Pt's prognosis for swallow improvement is fair-poor due to his cognition and deconditioning. Discussed pt's chronic aspiration risk and likely malnutrition despite consumption of honey thick liquids, Dys 1 (puree) with daughter Marlowe Kays) who verbalizing understanding. Sit upright, crush pills and full supervision. ST will continue for education. SLP Visit Diagnosis -- Attention and concentration deficit following -- Frontal lobe and executive function deficit following -- Impact on safety and function --   CHL IP TREATMENT RECOMMENDATION 04/14/2019 Treatment Recommendations Therapy as outlined in treatment plan below   Prognosis 04/14/2019 Prognosis for Safe Diet Advancement Fair Barriers to Reach Goals Cognitive deficits;Behavior Barriers/Prognosis Comment -- CHL IP DIET RECOMMENDATION 04/14/2019 SLP Diet Recommendations Dysphagia 1 (Puree) solids;Honey thick liquids Liquid Administration via Cup Medication Administration  Crushed with puree Compensations Minimize environmental distractions;Slow rate;Small sips/bites;Lingual sweep for clearance of pocketing;Monitor for anterior loss;Clear throat after each swallow Postural Changes Seated upright at 90 degrees   CHL IP OTHER RECOMMENDATIONS 04/14/2019 Recommended Consults -- Oral Care Recommendations  Oral care BID Other Recommendations Order thickener from pharmacy   CHL IP FOLLOW UP RECOMMENDATIONS 04/14/2019 Follow up Recommendations Home health SLP   CHL IP FREQUENCY AND DURATION 04/14/2019 Speech Therapy Frequency (ACUTE ONLY) min 2x/week Treatment Duration 2 weeks      CHL IP ORAL PHASE 04/14/2019 Oral Phase Impaired Oral - Pudding Teaspoon -- Oral - Pudding Cup -- Oral - Honey Teaspoon -- Oral - Honey Cup Lingual pumping;Reduced posterior propulsion Oral - Nectar Teaspoon -- Oral - Nectar Cup Lingual pumping;Reduced posterior propulsion Oral - Nectar Straw -- Oral - Thin Teaspoon -- Oral - Thin Cup Lingual pumping;Reduced posterior propulsion;Left anterior bolus loss;Right anterior bolus loss Oral - Thin Straw -- Oral - Puree Lingual pumping;Reduced posterior propulsion Oral - Mech Soft Lingual/palatal residue Oral - Regular -- Oral - Multi-Consistency -- Oral - Pill -- Oral Phase - Comment --  CHL IP PHARYNGEAL PHASE 04/14/2019 Pharyngeal Phase Impaired Pharyngeal- Pudding Teaspoon -- Pharyngeal -- Pharyngeal- Pudding Cup -- Pharyngeal -- Pharyngeal- Honey Teaspoon -- Pharyngeal -- Pharyngeal- Honey Cup Pharyngeal residue - pyriform;Pharyngeal residue - valleculae Pharyngeal -- Pharyngeal- Nectar Teaspoon -- Pharyngeal -- Pharyngeal- Nectar Cup Penetration/Aspiration during swallow Pharyngeal -- Pharyngeal- Nectar Straw -- Pharyngeal -- Pharyngeal- Thin Teaspoon -- Pharyngeal -- Pharyngeal- Thin Cup Penetration/Aspiration before swallow;Penetration/Aspiration during swallow;Pharyngeal residue - valleculae Pharyngeal -- Pharyngeal- Thin Straw -- Pharyngeal -- Pharyngeal- Puree Pharyngeal  residue - pyriform Pharyngeal -- Pharyngeal- Mechanical Soft -- Pharyngeal -- Pharyngeal- Regular -- Pharyngeal -- Pharyngeal- Multi-consistency -- Pharyngeal -- Pharyngeal- Pill -- Pharyngeal -- Pharyngeal Comment --  CHL IP CERVICAL ESOPHAGEAL PHASE 04/14/2019 Cervical Esophageal Phase WFL Pudding Teaspoon -- Pudding Cup -- Honey Teaspoon -- Honey Cup -- Nectar Teaspoon -- Nectar Cup -- Nectar Straw -- Thin Teaspoon -- Thin Cup -- Thin Straw -- Puree -- Mechanical Soft -- Regular -- Multi-consistency -- Pill -- Cervical Esophageal Comment -- [place] Siren 04/14/2019, 9:49 PM Orbie Pyo Litaker M.Ed Actor Pager [phone] Office [phone]              EEG adult  Result Date: 03/31/2019 Lora Havens, MD     03/31/2019  4:22 PM Patient Name: Jonathan Berger MRN: [id number] Epilepsy Attending: Lora Havens Referring Physician/Provider: Merlene Laughter, NP Date: 03/30/2019 Duration: 22.38 mins Patient history: 86 y.o. male with history of HTN presenting with slurred speech and L facial droop, diagnosed with right temporal lobe infarct. EEG to evaluate for seizure. Level of alertness: lethargic AEDs during EEG study: Versed Technical aspects: This EEG study was done with scalp electrodes positioned according to the 10-20 International system of electrode placement. Electrical activity was acquired at a sampling rate of '500Hz'  and reviewed with a high frequency filter of '70Hz'  and a low frequency filter of '1Hz' . EEG data were recorded continuously and digitally stored. DESCRIPTION: EEG showed continuous generalized 3-'6Hz'  theta-delta slowing. EEG was reactive to tactile stimuli. Hyperventilation and photic stimulation were not performed. ABNORMALITY - Continuous slow, generalized IMPRESSION: This study is suggestive of moderate to severe diffuse encephalopathy, non specific to etiology. No seizures or epileptiform discharges were seen throughout the recording. Lora Havens    ECHOCARDIOGRAM COMPLETE  Result Date: 03/23/2019    ECHOCARDIOGRAM REPORT   Patient Name:   Jonathan Berger Date of Exam: 03/23/2019 Medical Rec #:  [id number]        Height:       71.0 in Accession #:    [id number]       Weight:  189.2 lb Date of Birth:  [birth year]        BSA:          2.06 m Patient Age:    87 years         BP:           112/66 mmHg Patient Gender: M                HR:           55 bpm. Exam Location:  Inpatient Procedure: 2D Echo, Cardiac Doppler and Color Doppler Indications:    CVA  History:        Patient has no prior history of Echocardiogram examinations.                 Stroke, Arrythmias:Bradycardia; Risk Factors:Hypertension.  Sonographer:    Dustin Flock Referring Phys: [phone] ERIC LINDZEN  Sonographer Comments: Echo performed with patient supine and on artificial respirator. Image acquisition challenging due to respiratory motion. IMPRESSIONS  1. Left ventricular ejection fraction, by estimation, is 45%. The left ventricle has mildly decreased function. The left ventrical demonstrates regional wall motion abnormalities (see scoring diagram/findings for description). Akinesis of the mid to apical anteroseptal and inferoseptal walls. Left ventricular diastolic parameters are consistent with Grade I diastolic dysfunction (impaired relaxation).  2. Right ventricular systolic function is mildly reduced. The right ventricular size is mildly enlarged. There is mildly elevated pulmonary artery systolic pressure. The estimated right ventricular systolic pressure is 15.8 mmHg.  3. The aortic valve is tricuspid. Trivial aortic insufficiency, no aortic stenosis.  4. No significant mitral regurgitation.  5. Dilated IVC with estimated RA pressure 15 mmHg. FINDINGS  Left Ventricle: Left ventricular ejection fraction, by estimation, is 45%. The left ventricle has mildly decreased function. The left ventricle demonstrates regional wall motion abnormalities. The left ventricular internal  cavity size was normal in size. There is mildly increased left ventricular hypertrophy. Right Ventricle: The right ventricular size is mildly enlarged. No increase in right ventricular wall thickness. Right ventricular systolic function is mildly reduced. There is mildly elevated pulmonary artery systolic pressure. The tricuspid regurgitant  velocity is 2.54 m/s, and with an assumed right atrial pressure of 8 mmHg, the estimated right ventricular systolic pressure is 30.9 mmHg. Left Atrium: Left atrial size was normal in size. Right Atrium: Right atrial size was normal in size. Pericardium: There is no evidence of pericardial effusion. Mitral Valve: The mitral valve is normal in structure and function. No evidence of mitral valve regurgitation. No evidence of mitral valve stenosis. Tricuspid Valve: The tricuspid valve is normal in structure. Tricuspid valve regurgitation is mild. Aortic Valve: The aortic valve is tricuspid. Aortic valve regurgitation is trivial. Mild aortic valve sclerosis is present, with no evidence of aortic valve stenosis. Pulmonic Valve: The pulmonic valve was normal in structure. Pulmonic valve regurgitation is not visualized. Aorta: The aortic root and ascending aorta are structurally normal, with no evidence of dilitation. Venous: The inferior vena cava is dilated in size with less than 50% respiratory variability, suggesting right atrial pressure of 15 mmHg. IAS/Shunts: No atrial level shunt detected by color flow Doppler.  LEFT VENTRICLE PLAX 2D LVIDd:         4.63 cm  Diastology LVIDs:         3.40 cm  LV e' lateral:   4.35 cm/s LV PW:         1.37 cm  LV E/e' lateral: 10.5 LV IVS:  1.36 cm  LV e' medial:    4.24 cm/s LVOT diam:     2.20 cm  LV E/e' medial:  10.8 LV SV:         62.72 ml LV SV Index:   24.65 LVOT Area:     3.80 cm  RIGHT VENTRICLE RV Basal diam:  3.02 cm RV S prime:     5.87 cm/s TAPSE (M-mode): 3.3 cm LEFT ATRIUM           Index       RIGHT ATRIUM           Index  LA diam:      4.30 cm 2.09 cm/m  RA Area:     17.80 cm LA Vol (A4C): 49.5 ml 24.04 ml/m RA Volume:   45.40 ml  22.05 ml/m  AORTIC VALVE LVOT Vmax:   75.00 cm/s LVOT Vmean:  51.800 cm/s LVOT VTI:    0.165 m  AORTA Ao Root diam: 2.90 cm MITRAL VALVE                        TRICUSPID VALVE MV Area (PHT): 2.16 cm             TR Peak grad:   25.8 mmHg MV Decel Time: 352 msec             TR Vmax:        254.00 cm/s MV E velocity: 45.80 cm/s 103 cm/s MV A velocity: 62.10 cm/s 70.3 cm/s SHUNTS MV E/A ratio:  0.74       1.5       Systemic VTI:  0.16 m                                     Systemic Diam: 2.20 cm Loralie Champagne MD Electronically signed by Loralie Champagne MD Signature Date/Time: 03/23/2019/3:23:23 PM    Final    IR ENDOVASC INTRACRANIAL INF OTHER THAN THROMBO ART INC DIAG ANGIO  Result Date: 03/24/2019 INDICATION: New onset of slurred speech and left-sided weakness. Left-sided neglect. CT angiogram of the head and neck revealing a pre occlusive severe stenosis of the right internal carotid proximally with angiographic string sign.  Large non occlusive filling defect in the right middle cerebral artery distally extending into the inferior division proximally.  EXAM: 1. EMERGENT LARGE VESSEL OCCLUSION THROMBOLYSIS (anterior CIRCULATION)  COMPARISON:  CT angiogram of the head and neck of March 22, 2019.  MEDICATIONS: Ancef 2 g IV antibiotic was administered within 1 hour of the procedure.  ANESTHESIA/SEDATION: General anesthesia.  CONTRAST:  Omnipaque 300 approximately 120 mL.  FLUOROSCOPY TIME:  Fluoroscopy Time: 76 minutes 48 seconds (1992 mGy).  COMPLICATIONS: None immediate.  TECHNIQUE: Following a full explanation of the procedure along with the potential associated complications, an informed witnessed consent was obtained from the wife. The risks of intracranial hemorrhage of 10%, worsening neurological deficit, ventilator dependency, death and inability to revascularize were all reviewed in  detail with the patient's spouse.  The patient was then put under general anesthesia by the Department of Anesthesiology at [facility].  The right groin was prepped and draped in the usual sterile fashion. Thereafter using modified Seldinger technique, transfemoral access into the right common femoral artery was obtained without difficulty. Over a 0.035 inch guidewire a 5 French Pinnacle sheath was inserted. Through this, and also over a 0.035  inch guidewire a 5 [place] JB 1 catheter was advanced to the aortic arch region and selectively positioned in the innominate artery and the right common carotid artery.  FINDINGS: The innominate artery angiogram demonstrates the origin of the right subclavian artery and the right common carotid artery to be widely patent.  There is mild narrowing of the origin of the right vertebral artery.  The vessel is, otherwise, seen to opacify to the cranial skull base to the level of the right posterior-inferior cerebellar artery.  The right common carotid arteriogram demonstrates the origin the right external carotid artery and its major branches to be widely patent.  The right internal carotid artery at the bulb and just distally demonstrates severe pre occlusive stenosis with an angiographic string sign.  No focal area of severe stenosis also noted in the right internal carotid artery proximally. More distally there is decreased caliber of the right internal carotid artery to the cranial skull base. The left common carotid arteriogram demonstrates the left external carotid artery and its major branches to be widely patent.  The right internal carotid artery at the bulb demonstrates a high-grade stenosis of approximately 75% in the lateral projection associated with a segmental atherosclerotic plaque.  More distally the vessel is seen to opacify to the cranial skull base. The petrous, cavernous and the supraclinoid left ICA demonstrates wide patency.  The left  anterior and the left middle cerebral artery opacify into the capillary and venous phases. There is mild to moderate narrowing of the left anterior cerebral A1 segment.  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  Delayed arterial and capillary phases demonstrate opacification of the right anterior cerebral artery territory distal to the A2 segment.  PROCEDURE: The diagnostic JB 1 catheter in the right common carotid artery was then exchanged over a 0.035 inch 300 cm Rosen exchange guidewire for an 8 [place] Pinnacle sheath in the right groin which was then connected to continuous heparinized saline infusion. The patient was loaded with aspirin 81 mg, and Brilinta 180 mg via an orogastric tube in anticipation for revascularization of the right internal carotid artery proximally.  Over the exchange guidewire, an 087 balloon guide catheter which had been prepped with 50% contrast and 50% heparinized saline infusion was advanced and positioned just proximal to the right common carotid bifurcation. The guidewire was removed. Good aspiration was obtained from the hub of the balloon guide catheter. A gentle control arteriogram performed through this continued to demonstrate no change in the severely pre occlusive narrowing of the right internal carotid artery at the bulb, and also in the proximal right internal carotid artery.  Over a 0.014 inch standard Synchro micro guidewire, an 021 Trevo ProVue microcatheter was advanced to the distal end of the balloon guide catheter. The micro guidewire was then gently manipulated using a torque device and advanced without difficulty to the cervical petrous junction. However, advancement of the 021 Trevo ProVue micro catheter was met with significant resistance  at the upper tandem calcified lesion.  This was then replaced instead with a 017 Headway 2 tip microcatheter which was again advanced over a 0.014 inch standard Synchro micro guidewire without difficulty to the cervical petrous junction.  The micro guidewire was removed. Good aspiration obtained from the hub of the microcatheter. This in turn was  then exchanged for an 014 inch softip Transend 300 cm exchange micro guidewire with a J configuration.  Control arteriogram performed through the balloon guide catheter demonstrated improved flow through the right internal carotid artery proximally and distally. A 4 mm x 30 mm Viatrac 14 balloon angioplasty catheter was then prepped and purged with heparinized saline infusion.  Using the rapid exchange technique this was then advanced and positioned without difficulty across the proximal right ICA stenosis.  Control inflation was then carried out using micro inflation syringe device via micro tubing to the normal pressure where it was maintained for approximately 20 seconds. At this time, there was significant bradycardia noted. This prompted immediate deflation and retrieval of the balloon whilst the exchange micro guidewire was kept and positioned in the petrous segment of the right internal carotid artery.  A control arteriogram performed through the balloon guide catheter in the right common carotid artery now demonstrated significantly improved caliber and flow through the proximally angioplastied segment of the right internal carotid artery now revealing the significant stenosis distal to this involving the calcified plaque.  More distally, free flow was seen in the right internal carotid artery to the cranial skull base in the petrous cavernous and supraclinoid segments. The right middle cerebral artery distribution was noted to be widely patent with no evidence of filling defect in the right middle cerebral artery over its distal territories.  A TICI 3  revascularization had been achieved.  There was no visualization of the right anterior cerebral artery A1 segment. This was also noted to be on the CT angiogram of the head and neck performed earlier.  Measurements were then performed of the right internal carotid artery in the most normal appearance angiographically distal to the proximal angioplasty segment, and also the right common carotid artery. It was decided to proceed with placement of a 6/8 mm x 40 mm Xact stent.  The delivery catheter of the stent was then prepped and purged with heparinized saline infusion retrogradely.  Again using the rapid exchange technique, this was then advanced without difficulty to the right common carotid bifurcation.  The distal portion of the stent was positioned such that it was proximal to the more tandem heavily calcified lesion.  This stent was then deployed without difficulty. The delivery system was then retrieved and removed.  A control arteriogram performed through the balloon guide catheter in the right common carotid artery now demonstrates significantly improved caliber and flow through the stented segment and also through the internal carotid artery proximally and distally.  There continued to be the calcified significant stenosis involving the proximal right ICA as mentioned earlier.  Measurements were performed of the right internal carotid artery in the C1-C2 junction, and also aspirate was the noted in the right internal carotid artery at the proximal aspect of the initial stent.  Over the exchange micro guidewire, a 4 mm x 22 mm Resolute Onyx drug-eluting balloon mounted stent was prepped and purged with heparinized saline infusion and also with 50% contrast and 50% heparinized saline infusion.  This was then advanced again using the rapid exchange technique without difficulty into the distal portion of the initial stent. However, this stent could not be advanced into the C1-C2 junction on account  of the severe calcific plaque.  This is most likely related to calcified ledge protruding intraluminally.  After multiple attempts, the balloon mounted stent delivery system was retrieved and removed.  It was, therefore, decided to proceed with balloon angioplasty. A TREK 3  mm x 12 mm coronary angioplasty balloon catheter was prepped and purged with heparinized saline infusion. Using the rapid exchange technique, this was advanced without difficulty and positioned in the distal and proximal markers at the site of the severe high-grade stenosis. A control angioplasty was then performed again with a micro inflation syringe device via micro tubing. This was carried to 6 atmospheres where it was maintained for approximately 90 seconds. Upon deflation and proximal retrieval, a control arteriogram performed through the balloon guide catheter in the right common carotid artery now demonstrated significantly improved caliber and flow through the angioplastied segment also distally.  It was elected to try advancement of the previously Resolute Onyx 4 x 22 mm balloon mounted stent in view of the improved caliber following the angioplasty.  At this time the stent delivery catheter was advanced more distally. However, further advancement was again met middle with significant resistance secondary to calcified plaque.  Again after multiple trials, this was abandoned.  Control arteriogram performed through the balloon guide catheter in the right common carotid artery continued to demonstrate significantly improved caliber and flow through the angioplastied segment of the right internal carotid artery.  There was approximately 80% patency noted at the site of the angioplasty.  10 minute control arteriogram performed through the balloon guide catheter continued to demonstrate significantly improved caliber and flow through the angioplastied segment.  A control arteriogram performed intracranially continued to demonstrate a  completely revascularized right middle cerebral artery distribution. An anterior cerebral artery A1 segment again was visualized.  The balloon guide was then retrieved and removed. The 8 French sheath was left in place and connected to continuous heparinized saline infusion. No evidence of hematoma or bleeding was seen. The distal pulses remained Dopplerable in the dorsalis pedis, and the posterior tibial regions bilaterally unchanged.  The patient was also left intubated on account of the IV tPA the patient had had, in the large-bore Pinnacle sheath.  The patient was also given approximately 7.5 mg of Integrilin during the course of the treatment involving the right ICA angioplasty tandem lesion. Additionally it was decided to infuse Aggrastat over approximately 12 hours in order to obviate the potential for acute platelet aggregation at the site of the angioplasty, and also the stent.  A flat panel CT of the brain demonstrated no evidence of gross midline shift or of hemorrhage.  The patient was then transferred to the neuro ICU to continue with post revascularization management.  IMPRESSION: Complete revascularization of previously noted large filling defect in the right middle cerebral artery distally extending into the proximal inferior division following proximal revascularization, and use of approximately 7.5 mg with a TICI 3 revascularization of the right middle cerebral artery distribution.  Status post endovascular revascularization of symptomatic near complete occlusion of the right internal carotid artery proximally with a string sign with placement of stent following balloon angioplasty as described.  Status post endovascular balloon angioplasty of significant stenosis secondary to a calcified atherosclerotic plaque involving the proximal and middle 1/3 of the right internal carotid artery with patency of 80%.  Non visualization of the right anterior cerebral artery A1 segment. Opacification of  the right anterior cerebral artery at the level of the A2 segment from a communication with the left anterior cerebral artery A2 segment. 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  PLAN: Follow-up in the clinic 4 weeks post discharge.   Electronically Signed   By: Willaim Rayas  Deveshwar M.D.   On: 03/23/2019 15:13  CT HEAD CODE STROKE WO CONTRAST  Result Date: 04/03/2019 CLINICAL DATA:  Code stroke. 86 year old male with left facial droop and aphasia. EXAM: CT HEAD WITHOUT CONTRAST TECHNIQUE: Contiguous axial images were obtained from the base of the skull through the vertex without intravenous contrast. COMPARISON:  Head CT 12/25/2016. FINDINGS: Brain: Cerebral volume is not significantly changed since 2018. No midline shift, mass effect, or evidence of intracranial mass lesion. No ventriculomegaly. No acute intracranial hemorrhage identified. Scattered bilateral cerebral white matter hypodensity has increased. The deep gray nuclei, brainstem and cerebellum remain within normal limits. No cortically based acute infarct identified. Vascular: Calcified atherosclerosis at the skull base. There is asymmetric right MCA M1 or proximal M2 hyperdensity best seen on series 5, image 29 which is new from 2018. Skull: Negative. Sinuses/Orbits: Visualized paranasal sinuses and mastoids are stable and well pneumatized. Other: Negative orbit and scalp soft tissues. ASPECTS [facility] Stroke Program Early CT Score) Total score (0-10 with 10 being normal): 10 IMPRESSION: 1. Hyperdense Right MCA suspicious for emergent large vessel occlusion. But no CT changes of acute cortically based infarct identified. ASPECTS 10. 2. No acute intracranial hemorrhage. Increased scattered white matter hypodensity since 2018. 3. These results were communicated to Dr. [name] at 1:14 pmon 02/27/2021by text page via the [facility] messaging system. Electronically Signed   By: Genevie Ann M.D.   On:  03/20/2019 13:16   VAS US CAROTID  Result Date: 03/28/2019 Carotid Arterial Duplex Study Indications:   CVA and Right stent. Other Factors: Right carotid surgery 03/28/2019. Limitations    Today's exam was limited due to the patient's inability or                unwillingness to cooperate and Dementia. Performing Technologist: Baldwin Crown RDMS, RVT  Examination Guidelines: A complete evaluation includes B-mode imaging, spectral Doppler, color Doppler, and power Doppler as needed of all accessible portions of each vessel. Bilateral testing is considered an integral part of a complete examination. Limited examinations for reoccurring indications may be performed as noted.  Right Carotid Findings: +----------+--------+--------+--------+------------------+--------+           PSV cm/sEDV cm/sStenosisPlaque DescriptionComments +----------+--------+--------+--------+------------------+--------+ CCA Prox                                            stent    +----------+--------+--------+--------+------------------+--------+ ICA Prox  110     18      1-39%   heterogenous               +----------+--------+--------+--------+------------------+--------+ ICA Distal109     13                                         +----------+--------+--------+--------+------------------+--------+ ECA       200     38              heterogenous               +----------+--------+--------+--------+------------------+--------+ +----------+--------+-------+----------------+-------------------+           PSV cm/sEDV cmsDescribe        Arm Pressure (mmHG) +----------+--------+-------+----------------+-------------------+ [id number]            Multiphasic, WNL                    +----------+--------+-------+----------------+-------------------+ +---------+--------+--+--------+--+---------+  VertebralPSV cm/s46EDV cm/s15Antegrade +---------+--------+--+--------+--+---------+  Right Stent(s):  +---------------+--------+--------+--------+--------+--------+ CCA            PSV cm/sEDV cm/sStenosisWaveformComments +---------------+--------+--------+--------+--------+--------+ Prox to Stent  80      11                               +---------------+--------+--------+--------+--------+--------+ Proximal Stent 90      16                               +---------------+--------+--------+--------+--------+--------+ Mid Stent      100     25                               +---------------+--------+--------+--------+--------+--------+ Distal Stent   86      19                               +---------------+--------+--------+--------+--------+--------+ Distal to Stent86      35                               +---------------+--------+--------+--------+--------+--------+   Left Carotid Findings: +----------+-------+--------+--------+-----------------------+-----------------+           PSV    EDV cm/sStenosisPlaque Description     Comments                    cm/s                                                            +----------+-------+--------+--------+-----------------------+-----------------+ CCA Prox  104    21                                                       +----------+-------+--------+--------+-----------------------+-----------------+ CCA Distal91     15                                     intimal                                                                   thickening        +----------+-------+--------+--------+-----------------------+-----------------+ ICA Prox  281    64      60-79%  heterogenous and                                                          calcific                                 +----------+-------+--------+--------+-----------------------+-----------------+  ICA Mid   210    55                                                        +----------+-------+--------+--------+-----------------------+-----------------+ ICA Distal                                              Not assessed      +----------+-------+--------+--------+-----------------------+-----------------+ ECA       106    17                                                       +----------+-------+--------+--------+-----------------------+-----------------+ +----------+--------+--------+------------+-------------------+           PSV cm/sEDV cm/sDescribe    Arm Pressure (mmHG) +----------+--------+--------+------------+-------------------+ Subclavian                Not assessed                    +----------+--------+--------+------------+-------------------+ +---------+--------+--------+------------+ VertebralPSV cm/sEDV cm/sNot assessed +---------+--------+--------+------------+ Terminated exam early due to patient becoming increasingly uncooperative.  Summary: Right Carotid: Velocities in the right ICA are consistent with a 1-39% stenosis. Left Carotid: Velocities in the left ICA are consistent with a 60-79% stenosis.               ICA stenosis PSV in the 80-99% criteria, EDV on the higher end of               the criteria for 60-79%. Terminated exam early due to patient               becoming increasingly uncooperative. Vertebrals:  Right vertebral artery demonstrates antegrade flow. Left vertebral              artery not assessed. Subclavians: Normal flow hemodynamics were seen in the right subclavian artery.              Left subclavian artery not assessed. *See table(s) above for measurements and observations.  Electronically signed by Monica Martinez MD on 03/28/2019 at 2:53:24 PM.    Final    [place] EKG SITE RITE  Result Date: 03/24/2019 If Site Rite image not attached, placement could not be confirmed due to current cardiac rhythm.  IR ANGIO INTRA EXTRACRAN SEL COM CAROTID INNOMINATE UNI L MOD SED  Result Date: 04/06/2019 INDICATION: New  onset of slurred speech and left-sided weakness. Left-sided neglect. CT angiogram of the head and neck revealing a pre occlusive severe stenosis of the right internal carotid proximally with angiographic string sign.  Large non occlusive filling defect in the right middle cerebral artery distally extending into the inferior division proximally.  EXAM: 1. EMERGENT LARGE VESSEL OCCLUSION THROMBOLYSIS (anterior CIRCULATION)  COMPARISON:  CT angiogram of the head and neck of March 22, 2019.  MEDICATIONS: Ancef 2 g IV antibiotic was administered within 1 hour of the procedure.  ANESTHESIA/SEDATION: General anesthesia.  CONTRAST:  Omnipaque 300 approximately 120 mL.  FLUOROSCOPY TIME:  Fluoroscopy Time: 76 minutes 48 seconds (1992 mGy).  COMPLICATIONS: None immediate.  TECHNIQUE: Following a full explanation of the procedure along with the potential associated complications, an informed witnessed consent was obtained from the wife. The risks of intracranial hemorrhage of 10%, worsening neurological deficit, ventilator dependency, death and inability to revascularize were all reviewed in detail with the patient's spouse.  The patient was then put under general anesthesia by the Department of Anesthesiology at [facility]  The right groin was prepped and draped in the usual sterile fashion. Thereafter using modified Seldinger technique, transfemoral access into the right common femoral artery was obtained without difficulty. Over a 0.035 inch guidewire a 5 French Pinnacle sheath was inserted. Through this, and also over a 0.035 inch guidewire a 5 [place] JB 1 catheter was advanced to the aortic arch region and selectively positioned in the innominate artery and the right common carotid artery.  FINDINGS: The innominate artery angiogram demonstrates the origin of the right subclavian artery and the right common carotid artery to be widely patent.  There is mild narrowing of the origin of the right  vertebral artery.  The vessel is, otherwise, seen to opacify to the cranial skull base to the level of the right posterior-inferior cerebellar artery.  The right common carotid arteriogram demonstrates the origin the right external carotid artery and its major branches to be widely patent.  The right internal carotid artery at the bulb and just distally demonstrates severe pre occlusive stenosis with an angiographic string sign.  No focal area of severe stenosis also noted in the right internal carotid artery proximally. More distally there is decreased caliber of the right internal carotid artery to the cranial skull base. The left common carotid arteriogram demonstrates the left external carotid artery and its major branches to be widely patent.  The right internal carotid artery at the bulb demonstrates a high-grade stenosis of approximately 75% in the lateral projection associated with a segmental atherosclerotic plaque.  More distally the vessel is seen to opacify to the cranial skull base. The petrous, cavernous and the supraclinoid left ICA demonstrates wide patency.  The left anterior and the left middle cerebral artery opacify into the capillary and venous phases. There is mild to moderate narrowing of the left anterior cerebral A1 segment.  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  Delayed arterial and capillary phases demonstrate opacification of the right anterior cerebral artery territory distal to the A2 segment.  PROCEDURE: The diagnostic JB 1 catheter in the right common carotid artery was then exchanged over a 0.035 inch 300 cm Rosen exchange guidewire for an 8 [place] Pinnacle sheath in the right groin which was then connected to continuous heparinized saline infusion.  The patient was loaded with aspirin 81 mg, and Brilinta 180 mg via an orogastric tube in anticipation for revascularization of the right internal carotid artery proximally.  Over the exchange guidewire, an 087 balloon guide catheter which had been prepped with 50% contrast and 50% heparinized saline infusion was advanced and positioned just proximal to the right common carotid bifurcation. The guidewire was removed. Good aspiration was obtained from the hub of the balloon guide catheter. A gentle control arteriogram performed through this continued to demonstrate no change in the severely pre occlusive narrowing of the right internal carotid artery at the bulb, and also in the proximal right internal carotid artery.  Over a 0.014 inch standard Synchro micro guidewire, an 021 Trevo ProVue microcatheter was advanced to the distal end of the balloon guide catheter. The micro guidewire was then gently manipulated using a torque device and advanced without difficulty to the cervical petrous junction. However, advancement of the 021 Trevo ProVue micro catheter was met with significant resistance at the upper tandem calcified lesion.  This was then replaced instead with a 017 Headway 2 tip microcatheter which was again advanced over a 0.014 inch standard Synchro micro guidewire without difficulty to the cervical petrous junction.  The micro guidewire was removed. Good aspiration obtained from the hub of the microcatheter. This in turn was then exchanged for an 014 inch softip Transend 300 cm exchange micro guidewire with a J configuration.  Control arteriogram performed through the balloon guide catheter demonstrated improved flow through the right internal carotid artery proximally and distally. A 4 mm x 30 mm Viatrac 14 balloon angioplasty catheter was then prepped and purged with heparinized saline infusion.  Using the rapid exchange technique this was then advanced and positioned without difficulty across the  proximal right ICA stenosis.  Control inflation was then carried out using micro inflation syringe device via micro tubing to the normal pressure where it was maintained for approximately 20 seconds. At this time, there was significant bradycardia noted. This prompted immediate deflation and retrieval of the balloon whilst the exchange micro guidewire was kept and positioned in the petrous segment of the right internal carotid artery.  A control arteriogram performed through the balloon guide catheter in the right common carotid artery now demonstrated significantly improved caliber and flow through the proximally angioplastied segment of the right internal carotid artery now revealing the significant stenosis distal to this involving the calcified plaque.  More distally, free flow was seen in the right internal carotid artery to the cranial skull base in the petrous cavernous and supraclinoid segments. The right middle cerebral artery distribution was noted to be widely patent with no evidence of filling defect in the right middle cerebral artery over its distal territories.  A TICI 3 revascularization had been achieved.  There was no visualization of the right anterior cerebral artery A1 segment. This was also noted to be on the CT angiogram of the head and neck performed earlier.  Measurements were then performed of the right internal carotid artery in the most normal appearance angiographically distal to the proximal angioplasty segment, and also the right common carotid artery. It was decided to proceed with placement of a 6/8 mm x 40 mm Xact stent.  The delivery catheter of the stent was then prepped and purged with heparinized saline infusion retrogradely.  Again using the rapid exchange technique, this was then advanced without difficulty to the right common carotid bifurcation.  The distal portion of the stent was positioned such that it was proximal to the more tandem heavily calcified lesion.   This stent was then deployed without difficulty. The delivery system was then retrieved and removed.  A control arteriogram performed through the balloon guide catheter in the right common carotid artery now demonstrates significantly improved caliber and flow through the stented segment and also through the internal carotid artery proximally and distally.  There continued to be the calcified significant stenosis involving the proximal right ICA as mentioned earlier.  Measurements were performed of the right internal carotid artery in the C1-C2 junction, and also aspirate was the noted in the right internal carotid artery at the proximal aspect of the  initial stent.  Over the exchange micro guidewire, a 4 mm x 22 mm Resolute Onyx drug-eluting balloon mounted stent was prepped and purged with heparinized saline infusion and also with 50% contrast and 50% heparinized saline infusion.  This was then advanced again using the rapid exchange technique without difficulty into the distal portion of the initial stent. However, this stent could not be advanced into the C1-C2 junction on account of the severe calcific plaque.  This is most likely related to calcified ledge protruding intraluminally.  After multiple attempts, the balloon mounted stent delivery system was retrieved and removed.  It was, therefore, decided to proceed with balloon angioplasty. A TREK 3 mm x 12 mm coronary angioplasty balloon catheter was prepped and purged with heparinized saline infusion. Using the rapid exchange technique, this was advanced without difficulty and positioned in the distal and proximal markers at the site of the severe high-grade stenosis. A control angioplasty was then performed again with a micro inflation syringe device via micro tubing. This was carried to 6 atmospheres where it was maintained for approximately 90 seconds. Upon deflation and proximal retrieval, a control arteriogram performed through the balloon guide  catheter in the right common carotid artery now demonstrated significantly improved caliber and flow through the angioplastied segment also distally.  It was elected to try advancement of the previously Resolute Onyx 4 x 22 mm balloon mounted stent in view of the improved caliber following the angioplasty.  At this time the stent delivery catheter was advanced more distally. However, further advancement was again met middle with significant resistance secondary to calcified plaque.  Again after multiple trials, this was abandoned.  Control arteriogram performed through the balloon guide catheter in the right common carotid artery continued to demonstrate significantly improved caliber and flow through the angioplastied segment of the right internal carotid artery.  There was approximately 80% patency noted at the site of the angioplasty.  10 minute control arteriogram performed through the balloon guide catheter continued to demonstrate significantly improved caliber and flow through the angioplastied segment.  A control arteriogram performed intracranially continued to demonstrate a completely revascularized right middle cerebral artery distribution. An anterior cerebral artery A1 segment again was visualized.  The balloon guide was then retrieved and removed. The 8 French sheath was left in place and connected to continuous heparinized saline infusion. No evidence of hematoma or bleeding was seen. The distal pulses remained Dopplerable in the dorsalis pedis, and the posterior tibial regions bilaterally unchanged.  The patient was also left intubated on account of the IV tPA the patient had had, in the large-bore Pinnacle sheath.  The patient was also given approximately 7.5 mg of Integrilin during the course of the treatment involving the right ICA angioplasty tandem lesion. Additionally it was decided to infuse Aggrastat over approximately 12 hours in order to obviate the potential for acute platelet  aggregation at the site of the angioplasty, and also the stent.  A flat panel CT of the brain demonstrated no evidence of gross midline shift or of hemorrhage.  The patient was then transferred to the neuro ICU to continue with post revascularization management.  IMPRESSION: Complete revascularization of previously noted large filling defect in the right middle cerebral artery distally extending into the proximal inferior division following proximal revascularization, and use of approximately 7.5 mg with a TICI 3 revascularization of the right middle cerebral artery distribution.  Status post endovascular revascularization of symptomatic near complete occlusion of the right internal carotid artery proximally with  a string sign with placement of stent following balloon angioplasty as described.  Status post endovascular balloon angioplasty of significant stenosis secondary to a calcified atherosclerotic plaque involving the proximal and middle 1/3 of the right internal carotid artery with patency of 80%.  Non visualization of the right anterior cerebral artery A1 segment. Opacification of the right anterior cerebral artery at the level of the A2 segment from a communication with the left anterior cerebral artery A2 segment. 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  PLAN: Follow-up in the clinic 4 weeks post discharge.   Electronically Signed   By: Luanne Bras M.D.   On: 03/23/2019 15:13  IR ANGIO VERTEBRAL SEL SUBCLAVIAN INNOMINATE UNI L MOD SED  Result Date: 04/06/2019 INDICATION: New onset of slurred speech and left-sided weakness. Left-sided neglect. CT angiogram of the head and neck revealing a pre occlusive severe stenosis of the right internal carotid proximally with angiographic string sign.  Large non occlusive filling defect in the right middle cerebral artery distally extending into the inferior division proximally.  EXAM: 1.  EMERGENT LARGE VESSEL OCCLUSION THROMBOLYSIS (anterior CIRCULATION)  COMPARISON:  CT angiogram of the head and neck of March 22, 2019.  MEDICATIONS: Ancef 2 g IV antibiotic was administered within 1 hour of the procedure.  ANESTHESIA/SEDATION: General anesthesia.  CONTRAST:  Omnipaque 300 approximately 120 mL.  FLUOROSCOPY TIME:  Fluoroscopy Time: 76 minutes 48 seconds (1992 mGy).  COMPLICATIONS: None immediate.  TECHNIQUE: Following a full explanation of the procedure along with the potential associated complications, an informed witnessed consent was obtained from the wife. The risks of intracranial hemorrhage of 10%, worsening neurological deficit, ventilator dependency, death and inability to revascularize were all reviewed in detail with the patient's spouse.  The patient was then put under general anesthesia by the Department of Anesthesiology at [facility].  The right groin was prepped and draped in the usual sterile fashion. Thereafter using modified Seldinger technique, transfemoral access into the right common femoral artery was obtained without difficulty. Over a 0.035 inch guidewire a 5 French Pinnacle sheath was inserted. Through this, and also over a 0.035 inch guidewire a 5 [place] JB 1 catheter was advanced to the aortic arch region and selectively positioned in the innominate artery and the right common carotid artery.  FINDINGS: The innominate artery angiogram demonstrates the origin of the right subclavian artery and the right common carotid artery to be widely patent.  There is mild narrowing of the origin of the right vertebral artery.  The vessel is, otherwise, seen to opacify to the cranial skull base to the level of the right posterior-inferior cerebellar artery.  The right common carotid arteriogram demonstrates the origin the right external carotid artery and its major branches to be widely patent.  The right internal carotid artery at the bulb and just distally  demonstrates severe pre occlusive stenosis with an angiographic string sign.  No focal area of severe stenosis also noted in the right internal carotid artery proximally. More distally there is decreased caliber of the right internal carotid artery to the cranial skull base. The left common carotid arteriogram demonstrates the left external carotid artery and its major branches to be widely patent.  The right internal carotid artery at the bulb demonstrates a high-grade stenosis of approximately 75% in the lateral projection associated with a segmental atherosclerotic plaque.  More distally the vessel is seen to opacify to the cranial skull base. The  petrous, cavernous and the supraclinoid left ICA demonstrates wide patency.  The left anterior and the left middle cerebral artery opacify into the capillary and venous phases. There is mild to moderate narrowing of the left anterior cerebral A1 segment.  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  Delayed arterial and capillary phases demonstrate opacification of the right anterior cerebral artery territory distal to the A2 segment.  PROCEDURE: The diagnostic JB 1 catheter in the right common carotid artery was then exchanged over a 0.035 inch 300 cm Rosen exchange guidewire for an 8 [place] Pinnacle sheath in the right groin which was then connected to continuous heparinized saline infusion. The patient was loaded with aspirin 81 mg, and Brilinta 180 mg via an orogastric tube in anticipation for revascularization of the right internal carotid artery proximally.  Over the exchange guidewire, an 087 balloon guide catheter which had been prepped with 50% contrast and 50% heparinized saline infusion was advanced and positioned just proximal to the  right common carotid bifurcation. The guidewire was removed. Good aspiration was obtained from the hub of the balloon guide catheter. A gentle control arteriogram performed through this continued to demonstrate no change in the severely pre occlusive narrowing of the right internal carotid artery at the bulb, and also in the proximal right internal carotid artery.  Over a 0.014 inch standard Synchro micro guidewire, an 021 Trevo ProVue microcatheter was advanced to the distal end of the balloon guide catheter. The micro guidewire was then gently manipulated using a torque device and advanced without difficulty to the cervical petrous junction. However, advancement of the 021 Trevo ProVue micro catheter was met with significant resistance at the upper tandem calcified lesion.  This was then replaced instead with a 017 Headway 2 tip microcatheter which was again advanced over a 0.014 inch standard Synchro micro guidewire without difficulty to the cervical petrous junction.  The micro guidewire was removed. Good aspiration obtained from the hub of the microcatheter. This in turn was then exchanged for an 014 inch softip Transend 300 cm exchange micro guidewire with a J configuration.  Control arteriogram performed through the balloon guide catheter demonstrated improved flow through the right internal carotid artery proximally and distally. A 4 mm x 30 mm Viatrac 14 balloon angioplasty catheter was then prepped and purged with heparinized saline infusion.  Using the rapid exchange technique this was then advanced and positioned without difficulty across the proximal right ICA stenosis.  Control inflation was then carried out using micro inflation syringe device via micro tubing to the normal pressure where it was maintained for approximately 20 seconds. At this time, there was significant bradycardia noted. This prompted immediate deflation and retrieval of the balloon whilst the exchange micro guidewire was kept  and positioned in the petrous segment of the right internal carotid artery.  A control arteriogram performed through the balloon guide catheter in the right common carotid artery now demonstrated significantly improved caliber and flow through the proximally angioplastied segment of the right internal carotid artery now revealing the significant stenosis distal to this involving the calcified plaque.  More distally, free flow was seen in the right internal carotid artery to the cranial skull base in the petrous cavernous and supraclinoid segments. The right middle cerebral artery distribution was noted to be widely patent  with no evidence of filling defect in the right middle cerebral artery over its distal territories.  A TICI 3 revascularization had been achieved.  There was no visualization of the right anterior cerebral artery A1 segment. This was also noted to be on the CT angiogram of the head and neck performed earlier.  Measurements were then performed of the right internal carotid artery in the most normal appearance angiographically distal to the proximal angioplasty segment, and also the right common carotid artery. It was decided to proceed with placement of a 6/8 mm x 40 mm Xact stent.  The delivery catheter of the stent was then prepped and purged with heparinized saline infusion retrogradely.  Again using the rapid exchange technique, this was then advanced without difficulty to the right common carotid bifurcation.  The distal portion of the stent was positioned such that it was proximal to the more tandem heavily calcified lesion.  This stent was then deployed without difficulty. The delivery system was then retrieved and removed.  A control arteriogram performed through the balloon guide catheter in the right common carotid artery now demonstrates significantly improved caliber and flow through the stented segment and also through the internal carotid artery proximally and distally.   There continued to be the calcified significant stenosis involving the proximal right ICA as mentioned earlier.  Measurements were performed of the right internal carotid artery in the C1-C2 junction, and also aspirate was the noted in the right internal carotid artery at the proximal aspect of the initial stent.  Over the exchange micro guidewire, a 4 mm x 22 mm Resolute Onyx drug-eluting balloon mounted stent was prepped and purged with heparinized saline infusion and also with 50% contrast and 50% heparinized saline infusion.  This was then advanced again using the rapid exchange technique without difficulty into the distal portion of the initial stent. However, this stent could not be advanced into the C1-C2 junction on account of the severe calcific plaque.  This is most likely related to calcified ledge protruding intraluminally.  After multiple attempts, the balloon mounted stent delivery system was retrieved and removed.  It was, therefore, decided to proceed with balloon angioplasty. A TREK 3 mm x 12 mm coronary angioplasty balloon catheter was prepped and purged with heparinized saline infusion. Using the rapid exchange technique, this was advanced without difficulty and positioned in the distal and proximal markers at the site of the severe high-grade stenosis. A control angioplasty was then performed again with a micro inflation syringe device via micro tubing. This was carried to 6 atmospheres where it was maintained for approximately 90 seconds. Upon deflation and proximal retrieval, a control arteriogram performed through the balloon guide catheter in the right common carotid artery now demonstrated significantly improved caliber and flow through the angioplastied segment also distally.  It was elected to try advancement of the previously Resolute Onyx 4 x 22 mm balloon mounted stent in view of the improved caliber following the angioplasty.  At this time the stent delivery catheter was  advanced more distally. However, further advancement was again met middle with significant resistance secondary to calcified plaque.  Again after multiple trials, this was abandoned.  Control arteriogram performed through the balloon guide catheter in the right common carotid artery continued to demonstrate significantly improved caliber and flow through the angioplastied segment of the right internal carotid artery.  There was approximately 80% patency noted at the site of the angioplasty.  10 minute control arteriogram performed through the balloon guide catheter  continued to demonstrate significantly improved caliber and flow through the angioplastied segment.  A control arteriogram performed intracranially continued to demonstrate a completely revascularized right middle cerebral artery distribution. An anterior cerebral artery A1 segment again was visualized.  The balloon guide was then retrieved and removed. The 8 French sheath was left in place and connected to continuous heparinized saline infusion. No evidence of hematoma or bleeding was seen. The distal pulses remained Dopplerable in the dorsalis pedis, and the posterior tibial regions bilaterally unchanged.  The patient was also left intubated on account of the IV tPA the patient had had, in the large-bore Pinnacle sheath.  The patient was also given approximately 7.5 mg of Integrilin during the course of the treatment involving the right ICA angioplasty tandem lesion. Additionally it was decided to infuse Aggrastat over approximately 12 hours in order to obviate the potential for acute platelet aggregation at the site of the angioplasty, and also the stent.  A flat panel CT of the brain demonstrated no evidence of gross midline shift or of hemorrhage.  The patient was then transferred to the neuro ICU to continue with post revascularization management.  IMPRESSION: Complete revascularization of previously noted large filling defect in the  right middle cerebral artery distally extending into the proximal inferior division following proximal revascularization, and use of approximately 7.5 mg with a TICI 3 revascularization of the right middle cerebral artery distribution.  Status post endovascular revascularization of symptomatic near complete occlusion of the right internal carotid artery proximally with a string sign with placement of stent following balloon angioplasty as described.  Status post endovascular balloon angioplasty of significant stenosis secondary to a calcified atherosclerotic plaque involving the proximal and middle 1/3 of the right internal carotid artery with patency of 80%.  Non visualization of the right anterior cerebral artery A1 segment. Opacification of the right anterior cerebral artery at the level of the A2 segment from a communication with the left anterior cerebral artery A2 segment. 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  PLAN: Follow-up in the clinic 4 weeks post discharge.   Electronically Signed   By: Luanne Bras M.D.   On: 03/23/2019 15:13   Microbiology No results found for this or any previous visit (from the past 240 hour(s)).  Lab Basic Metabolic Panel: Recent Labs  Lab 04/13/19 0434 04/14/19 0841 04/15/19 0716 04/16/19 0256 04/17/19 0401  NA 146* 144 144 149* 153*  K 3.6 3.3* 3.2* 3.5 3.7  CL 111 112* 113* 117* 121*  CO2 26 21* 21* 18* 21*  GLUCOSE 134* 134* 135* 161* 126*  BUN 33* 27* 25* 28* 37*  CREATININE 1.28* 1.27* 1.34* 1.56* 1.54*  CALCIUM 9.6 9.3 9.6 10.4* 10.0  MG  --   --  2.0  --   --    Liver Function Tests: No results for input(s): AST, ALT, ALKPHOS, BILITOT, PROT, ALBUMIN in the last 168 hours. No results for input(s): LIPASE, AMYLASE in the last 168 hours. No results for input(s): AMMONIA in the last 168 hours. CBC: Recent Labs  Lab 04/15/19 0716 04/17/19 0401  WBC 8.3 5.2  HGB 11.2*  12.9*  HCT 35.0* 41.1  MCV 99.7 102.5*  PLT 228 162   Cardiac Enzymes: No results for input(s): CKTOTAL, CKMB, CKMBINDEX, TROPONINI in the last 168 hours. Sepsis Labs: Recent Labs  Lab 04/15/19 0716 04/17/19 0401  WBC 8.3 5.2    Procedures/Operations  Intubation/extubation Echocardiogram Carotid doppler  tPA and IR with L ICA stent placement   [facility] 04-22-2019, 6:15 PM

## 2019-05-14 NOTE — Progress Notes (Signed)
RN called Linda's number and reached [name] and spoke with her about wheelchair that was left behind. Per [name], wheelchair was ordered for home use; however, patient was not ever able to make it home. Wheelchair needs to be returned. [name] asked this RN if she could reach out to social work to begin that process. SW paged by this RN.

## 2019-05-14 DEATH — deceased

## 2022-01-03 IMAGING — DX DG CHEST 1V PORT
1 series · 1 of 1 positions shown · non-contrast
Comparison: 04/04/2019

CLINICAL DATA: Short of breath, recent stroke

EXAM:
PORTABLE CHEST 1 VIEW

[chest ap]
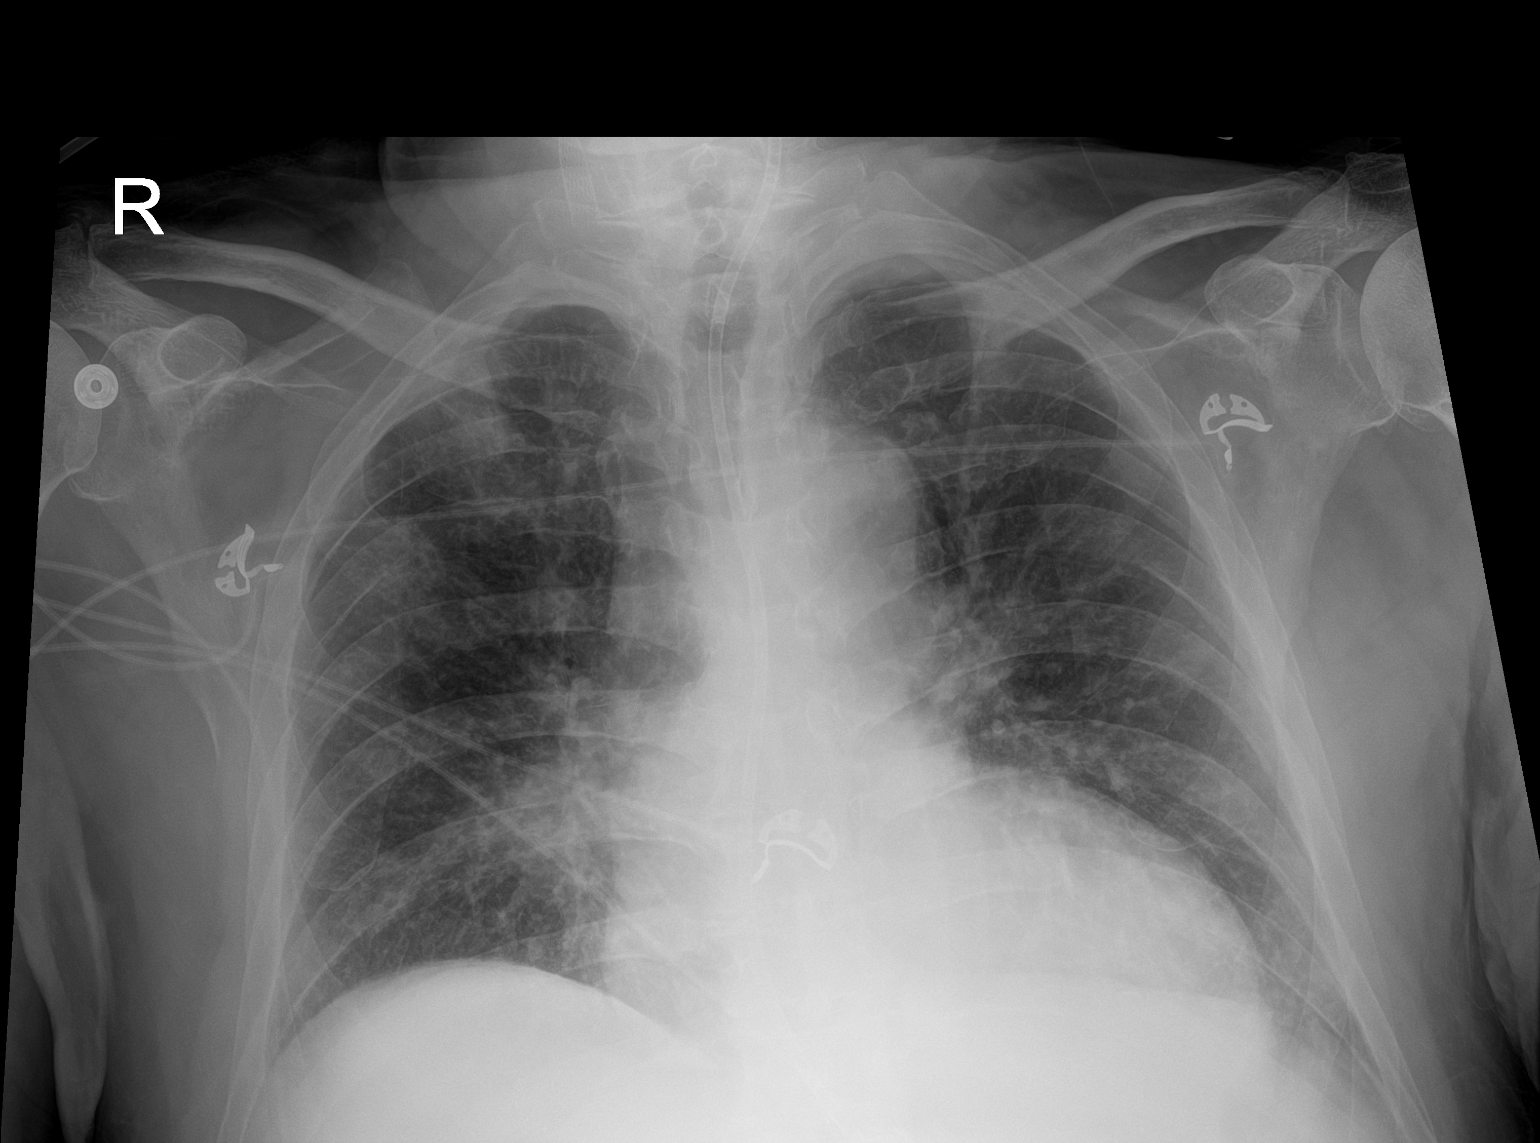

[1 of 1 positions shown; findings below may reference images not displayed]

FINDINGS: Single frontal view of the chest demonstrates enteric catheter
passing below diaphragm tip excluded by collimation. The cardiac
silhouette is enlarged but stable. Chronic central vascular
congestion is again noted. Patchy areas of consolidation within the
right upper and right lower lung zones are again noted, favor edema
given the waxing and waning appearance since prior studies. No
effusion or pneumothorax.
IMPRESSION: 1. Slight worsening volume status, with right perihilar airspace
disease superimposed upon chronic central vascular congestion.

## 2022-01-15 IMAGING — DX DG CHEST 1V PORT
2 series · 2 of 2 positions shown · non-contrast
Comparison: 04/16/2019

CLINICAL DATA: Shortness of breath

EXAM:
PORTABLE CHEST 1 VIEW

[chest ap (1 of 2)]
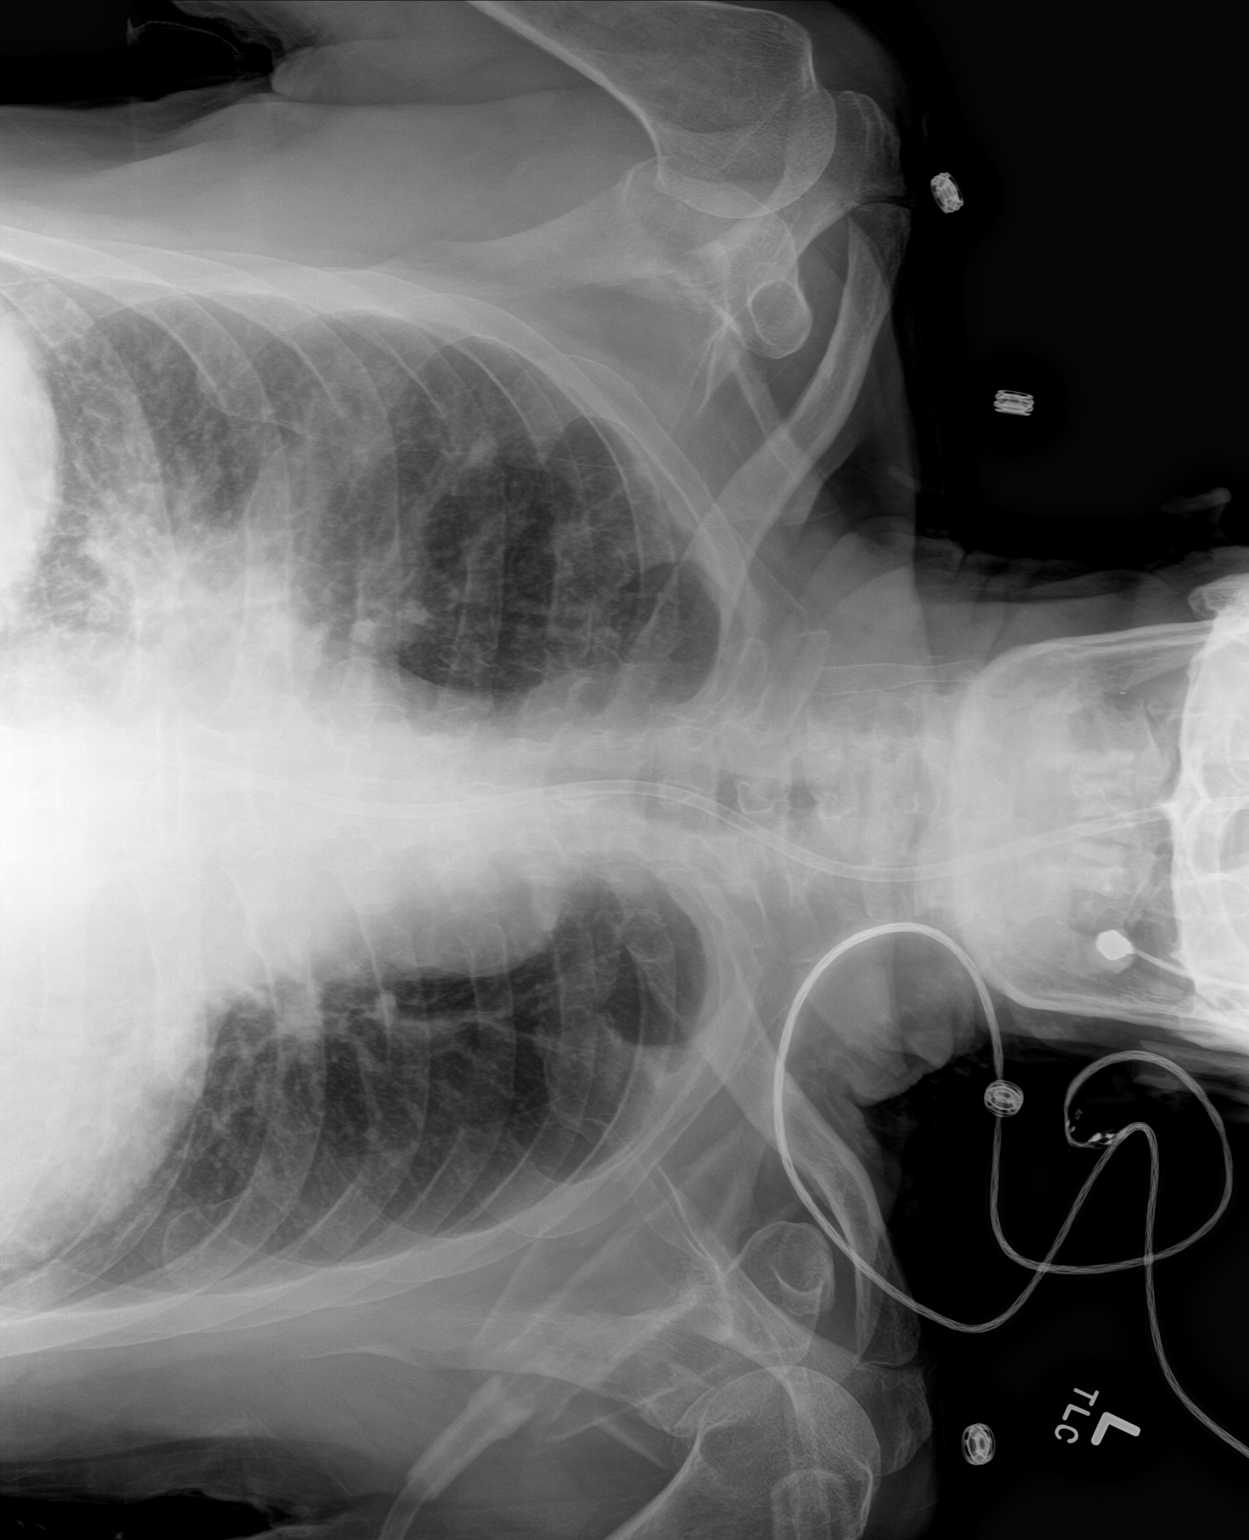

[chest ap (2 of 2)]
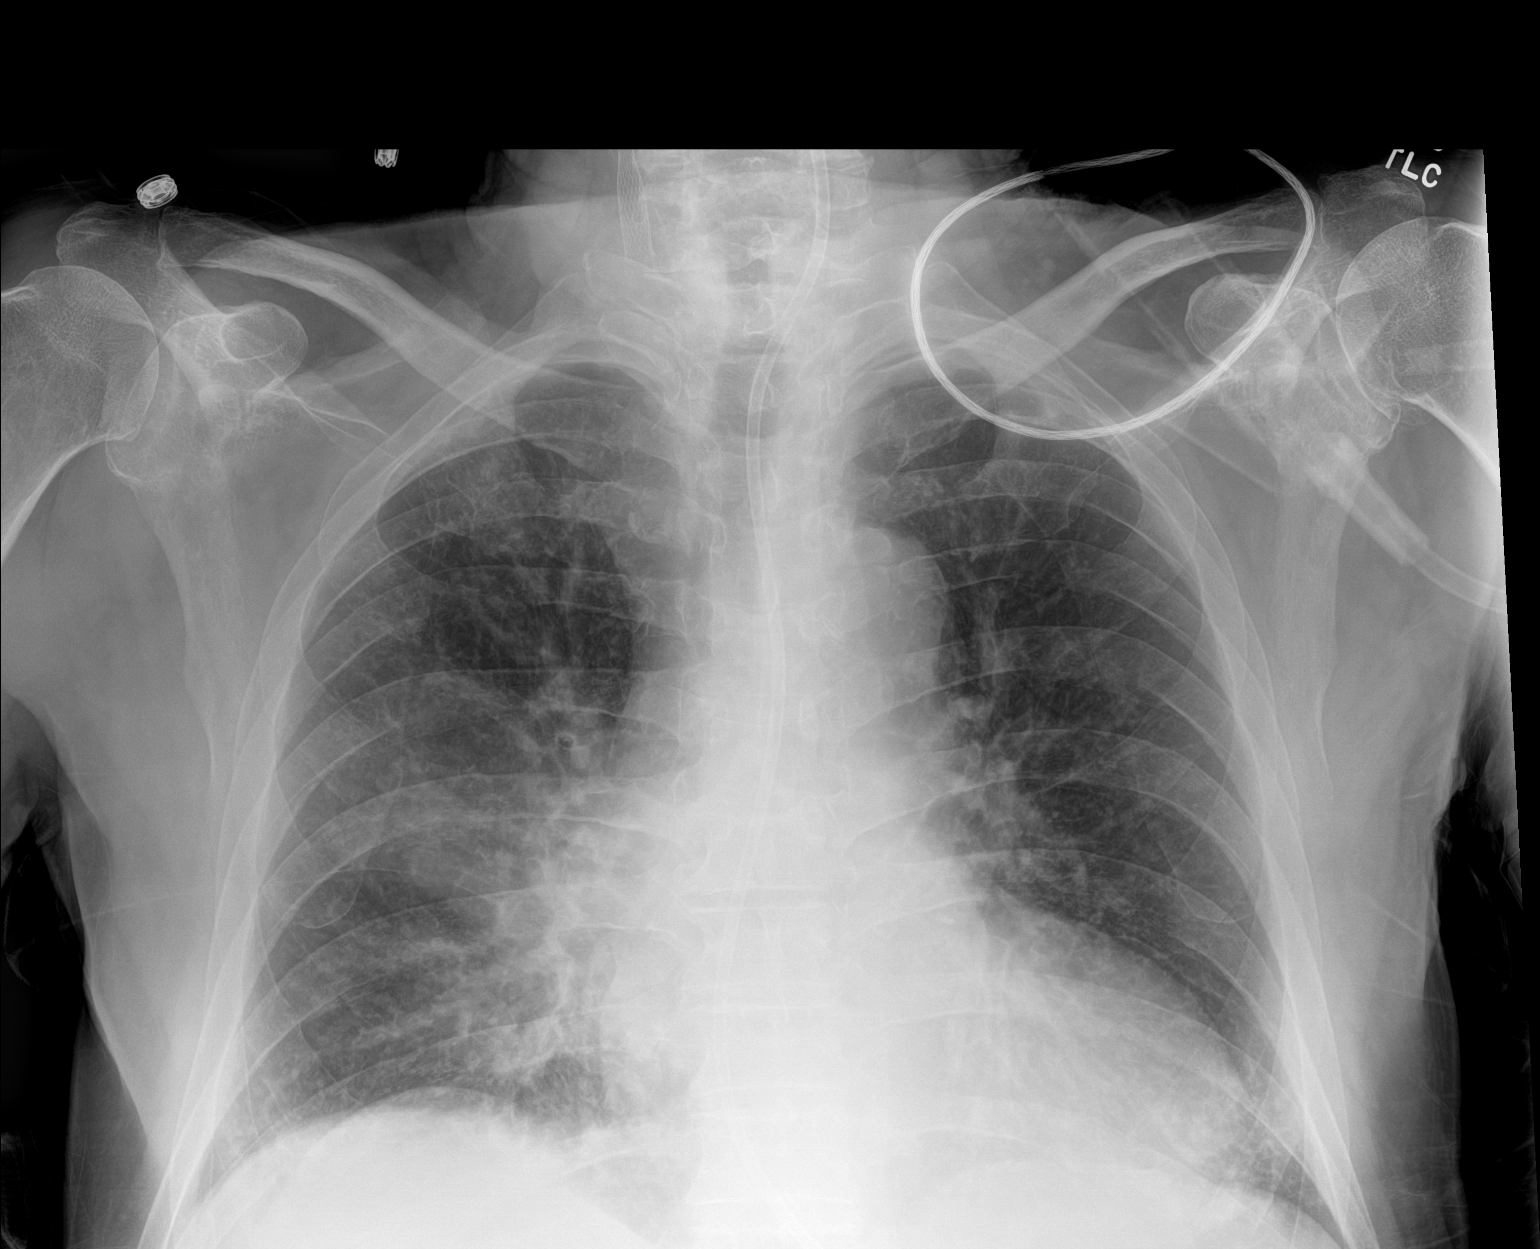

[2 of 2 positions shown; findings below may reference images not displayed]

FINDINGS: Cardiomegaly. Airspace disease in the right perihilar and lower lobe
region has increased since prior study. Mild left basilar opacity.
No effusions or acute bony abnormality.
IMPRESSION: Bilateral airspace disease, worsening in the right perihilar and
infrahilar regions concerning for pneumonia.
